# Patient Record
Sex: Female | Born: 1965 | Race: White | Hispanic: No | Marital: Married | Smoking: Current every day smoker
Health system: Southern US, Community
[De-identification: ages and names within clinical notes are randomized; demographics above are authoritative.]

## PROBLEM LIST (undated history)

## (undated) DIAGNOSIS — C50912 Malignant neoplasm of unspecified site of left female breast: Secondary | ICD-10-CM

## (undated) DIAGNOSIS — D75829 Heparin-induced thrombocytopenia, unspecified: Secondary | ICD-10-CM

## (undated) DIAGNOSIS — R112 Nausea with vomiting, unspecified: Secondary | ICD-10-CM

## (undated) DIAGNOSIS — Z9889 Other specified postprocedural states: Secondary | ICD-10-CM

## (undated) DIAGNOSIS — IMO0002 Reserved for concepts with insufficient information to code with codable children: Secondary | ICD-10-CM

## (undated) DIAGNOSIS — D689 Coagulation defect, unspecified: Secondary | ICD-10-CM

## (undated) DIAGNOSIS — G4730 Sleep apnea, unspecified: Secondary | ICD-10-CM

## (undated) DIAGNOSIS — F419 Anxiety disorder, unspecified: Secondary | ICD-10-CM

## (undated) DIAGNOSIS — D7582 Heparin induced thrombocytopenia (HIT): Secondary | ICD-10-CM

## (undated) DIAGNOSIS — E785 Hyperlipidemia, unspecified: Secondary | ICD-10-CM

## (undated) DIAGNOSIS — J449 Chronic obstructive pulmonary disease, unspecified: Secondary | ICD-10-CM

## (undated) DIAGNOSIS — I2510 Atherosclerotic heart disease of native coronary artery without angina pectoris: Secondary | ICD-10-CM

## (undated) DIAGNOSIS — D509 Iron deficiency anemia, unspecified: Secondary | ICD-10-CM

## (undated) DIAGNOSIS — K219 Gastro-esophageal reflux disease without esophagitis: Secondary | ICD-10-CM

## (undated) DIAGNOSIS — M26609 Unspecified temporomandibular joint disorder, unspecified side: Secondary | ICD-10-CM

## (undated) DIAGNOSIS — I10 Essential (primary) hypertension: Secondary | ICD-10-CM

## (undated) DIAGNOSIS — A770 Spotted fever due to Rickettsia rickettsii: Secondary | ICD-10-CM

## (undated) DIAGNOSIS — I2699 Other pulmonary embolism without acute cor pulmonale: Secondary | ICD-10-CM

## (undated) HISTORY — DX: Coagulation defect, unspecified: D68.9

## (undated) HISTORY — DX: Anxiety disorder, unspecified: F41.9

## (undated) HISTORY — DX: Other pulmonary embolism without acute cor pulmonale: I26.99

## (undated) HISTORY — DX: Hyperlipidemia, unspecified: E78.5

## (undated) HISTORY — DX: Malignant neoplasm of unspecified site of left female breast: C50.912

## (undated) HISTORY — DX: Gastro-esophageal reflux disease without esophagitis: K21.9

## (undated) HISTORY — PX: LEFT OOPHORECTOMY: SHX1961

## (undated) HISTORY — DX: Sleep apnea, unspecified: G47.30

## (undated) HISTORY — DX: Heparin-induced thrombocytopenia, unspecified: D75.829

## (undated) HISTORY — DX: Iron deficiency anemia, unspecified: D50.9

## (undated) HISTORY — PX: LUMBAR FUSION: SHX111

## (undated) HISTORY — DX: Heparin induced thrombocytopenia (HIT): D75.82

## (undated) HISTORY — DX: Reserved for concepts with insufficient information to code with codable children: IMO0002

## (undated) HISTORY — DX: Unspecified temporomandibular joint disorder, unspecified side: M26.609

## (undated) HISTORY — PX: CHOLECYSTECTOMY: SHX55

---

## 1999-04-21 ENCOUNTER — Ambulatory Visit (HOSPITAL_BASED_OUTPATIENT_CLINIC_OR_DEPARTMENT_OTHER): Admission: RE | Admit: 1999-04-21 | Discharge: 1999-04-21 | Payer: Self-pay | Admitting: Otolaryngology

## 2001-02-09 ENCOUNTER — Encounter: Admission: RE | Admit: 2001-02-09 | Discharge: 2001-02-09 | Payer: Self-pay | Admitting: Internal Medicine

## 2001-02-09 ENCOUNTER — Encounter: Payer: Self-pay | Admitting: Internal Medicine

## 2001-02-23 ENCOUNTER — Encounter: Payer: Self-pay | Admitting: Neurosurgery

## 2001-02-23 ENCOUNTER — Encounter: Admission: RE | Admit: 2001-02-23 | Discharge: 2001-02-23 | Payer: Self-pay | Admitting: Neurosurgery

## 2001-10-02 ENCOUNTER — Encounter: Payer: Self-pay | Admitting: Neurosurgery

## 2001-10-05 ENCOUNTER — Inpatient Hospital Stay (HOSPITAL_COMMUNITY): Admission: RE | Admit: 2001-10-05 | Discharge: 2001-10-06 | Payer: Self-pay | Admitting: Neurosurgery

## 2001-10-05 ENCOUNTER — Encounter: Payer: Self-pay | Admitting: Neurosurgery

## 2001-11-12 ENCOUNTER — Encounter: Payer: Self-pay | Admitting: Neurosurgery

## 2001-11-12 ENCOUNTER — Ambulatory Visit (HOSPITAL_COMMUNITY): Admission: RE | Admit: 2001-11-12 | Discharge: 2001-11-12 | Payer: Self-pay

## 2002-05-02 ENCOUNTER — Encounter: Payer: Self-pay | Admitting: Neurosurgery

## 2002-05-02 ENCOUNTER — Encounter: Admission: RE | Admit: 2002-05-02 | Discharge: 2002-05-02 | Payer: Self-pay | Admitting: Neurosurgery

## 2002-11-22 ENCOUNTER — Encounter: Payer: Self-pay | Admitting: Otolaryngology

## 2002-11-22 ENCOUNTER — Encounter: Admission: RE | Admit: 2002-11-22 | Discharge: 2002-11-22 | Payer: Self-pay | Admitting: Otolaryngology

## 2003-01-01 ENCOUNTER — Other Ambulatory Visit: Admission: RE | Admit: 2003-01-01 | Discharge: 2003-01-01 | Payer: Self-pay | Admitting: Obstetrics and Gynecology

## 2003-01-08 ENCOUNTER — Ambulatory Visit (HOSPITAL_COMMUNITY): Admission: RE | Admit: 2003-01-08 | Discharge: 2003-01-08 | Payer: Self-pay | Admitting: Obstetrics and Gynecology

## 2003-01-08 ENCOUNTER — Encounter: Payer: Self-pay | Admitting: Obstetrics and Gynecology

## 2003-03-05 ENCOUNTER — Ambulatory Visit (HOSPITAL_COMMUNITY): Admission: RE | Admit: 2003-03-05 | Discharge: 2003-03-05 | Payer: Self-pay | Admitting: Obstetrics and Gynecology

## 2004-01-16 ENCOUNTER — Encounter: Admission: RE | Admit: 2004-01-16 | Discharge: 2004-01-16 | Payer: Self-pay | Admitting: Obstetrics and Gynecology

## 2004-01-28 ENCOUNTER — Other Ambulatory Visit: Admission: RE | Admit: 2004-01-28 | Discharge: 2004-01-28 | Payer: Self-pay | Admitting: Obstetrics and Gynecology

## 2006-08-08 ENCOUNTER — Encounter: Admission: RE | Admit: 2006-08-08 | Discharge: 2006-08-08 | Payer: Self-pay | Admitting: Family Medicine

## 2007-05-16 ENCOUNTER — Ambulatory Visit (HOSPITAL_COMMUNITY): Admission: RE | Admit: 2007-05-16 | Discharge: 2007-05-16 | Payer: Self-pay | Admitting: Family Medicine

## 2007-06-25 ENCOUNTER — Inpatient Hospital Stay (HOSPITAL_COMMUNITY): Admission: AD | Admit: 2007-06-25 | Discharge: 2007-06-25 | Payer: Self-pay | Admitting: Obstetrics and Gynecology

## 2007-09-27 ENCOUNTER — Encounter: Admission: RE | Admit: 2007-09-27 | Discharge: 2007-09-27 | Payer: Self-pay | Admitting: Obstetrics and Gynecology

## 2007-12-04 ENCOUNTER — Encounter: Admission: RE | Admit: 2007-12-04 | Discharge: 2007-12-04 | Payer: Self-pay | Admitting: Obstetrics and Gynecology

## 2008-01-22 ENCOUNTER — Inpatient Hospital Stay (HOSPITAL_COMMUNITY): Admission: AD | Admit: 2008-01-22 | Discharge: 2008-01-25 | Payer: Self-pay | Admitting: Obstetrics and Gynecology

## 2009-04-28 ENCOUNTER — Encounter: Admission: RE | Admit: 2009-04-28 | Discharge: 2009-04-28 | Payer: Self-pay | Admitting: Family Medicine

## 2010-09-01 ENCOUNTER — Encounter: Admission: RE | Admit: 2010-09-01 | Discharge: 2010-09-01 | Payer: Self-pay | Admitting: Family Medicine

## 2010-09-11 ENCOUNTER — Encounter: Admission: RE | Admit: 2010-09-11 | Discharge: 2010-09-11 | Payer: Self-pay | Admitting: Family Medicine

## 2010-09-14 ENCOUNTER — Encounter: Admission: RE | Admit: 2010-09-14 | Discharge: 2010-09-14 | Payer: Self-pay | Admitting: Family Medicine

## 2010-10-29 ENCOUNTER — Encounter
Admission: RE | Admit: 2010-10-29 | Discharge: 2010-10-29 | Payer: Self-pay | Source: Home / Self Care | Attending: Family Medicine | Admitting: Family Medicine

## 2010-11-06 ENCOUNTER — Encounter: Payer: Self-pay | Admitting: Family Medicine

## 2010-11-07 ENCOUNTER — Encounter: Payer: Self-pay | Admitting: Family Medicine

## 2010-11-07 ENCOUNTER — Encounter: Payer: Self-pay | Admitting: Physical Medicine and Rehabilitation

## 2010-11-17 ENCOUNTER — Encounter: Payer: Self-pay | Admitting: Family Medicine

## 2011-02-28 ENCOUNTER — Other Ambulatory Visit: Payer: Self-pay | Admitting: General Surgery

## 2011-03-01 NOTE — H&P (Signed)
Brenda Cruz, Brenda Cruz               ACCOUNT NO.:  [id number]   MEDICAL RECORD NO.:  [id number]          PATIENT TYPE:  INP   LOCATION:  9168                          FACILITY:  [facility]   PHYSICIAN:  Lenoard Aden, M.D.DATE OF BIRTH:  07-Sep-1966   DATE OF ADMISSION:  01/22/2008  DATE OF DISCHARGE:                              HISTORY & PHYSICAL   INDICATIONS FOR INDUCTION:  Labile poorly controlled gestational  diabetes and polyhydramnios for induction.  She is a 45 year old white  female G1, P0, history of IVF induced conception at [redacted] weeks gestation  who presents for cervical ripening and induction as per noted.   ALLERGIES:  She has allergies to,  1. SULFA.  2. CIPRO.  3. IVP DYE.  4. PENICILLIN.  5. ZITHROMAX.   SOCIAL HISTORY:  She is a nonsmoker and nondrinker.  Denies domestic  physical violence.   MEDICATIONS:  Include,  1. Darvocet as needed for back pain.  2. Omnicef for an upper respiratory infection currently improved.  3. Prenatal vitamins.   FAMILY HISTORY:  She has a family history of breast cancer, heart  disease, myocardial infarction, hypertension, COPD, stroke, seizure  disorder, lupus, esophageal cancer, stomach cancer, and bipolar  disorder.   PERSONAL HISTORY:  She has a personal history of tonsillectomy, left  S&O, spinal fusion, and ovarian torsion.   Her prenatal course is complicated by gestational diabetes with recently  increased labile blood sugars, worsening back pain due to previous  spinal surgery, and polyhydramnios.   PHYSICAL EXAMINATION:  GENERAL:  She is a well-developed, well-  nourished, white female in no acute distress.  HEENT:  Normal.  LUNGS:  Clear.  HEART:  Regular rate rhythm.  ABDOMEN:  Soft and nontender.  Estimated fetal weight is by ultrasound 1  week ago 6 pounds and 2 ounces with borderline polyhydramnios.  Cervix  closed, 80% vertex -1.  EXTREMITIES:  There are no cords.  NEUROLOGICAL:  Nonfocal.  SKIN:   Intact.   IMPRESSION:  1. A 38-week intrauterine pregnancy.  2. Poorly compliant gestational diabetes.  3. Worsening low back pain.  4. Polyhydramnios.   PLAN:  Plan is to proceed with cervical opening induction, Cervidil,  Ambien, and Pitocin epidural as needed.      Lenoard Aden, M.D.  Electronically Signed     Lenoard Aden, M.D.  Electronically Signed    RJT/MEDQ  D:  01/22/2008  T:  01/23/2008  Job:  [id number]

## 2011-03-04 NOTE — Op Note (Signed)
Brenda Cruz, Brenda Cruz                         ACCOUNT NO.:  [id number]   MEDICAL RECORD NO.:  [id number]                   PATIENT TYPE:  AMB   LOCATION:  SDC                                  FACILITY:  [facility]   PHYSICIAN:  Lenoard Aden, M.D.             DATE OF BIRTH:  04-May-1966   DATE OF PROCEDURE:  03/05/2003  DATE OF DISCHARGE:                                 OPERATIVE REPORT   PREOPERATIVE DIAGNOSES:  1. Left lower quadrant pain.  2. Left tubal obstruction.   POSTOPERATIVE DIAGNOSES:  1. Left tubal obstruction.  2. Pelvic adhesions.  3. Left ovarian cyst.  4. Endometriosis.   SURGEON:  Lenoard Aden, M.D.   ASSISTANT:  Cordelia Pen A. Rosalio Macadamia, M.D.   ANESTHESIA:  General.   ESTIMATED BLOOD LOSS:  Less than 50 mL.   COMPLICATIONS:  None.   DRAINS:  None.   DISPOSITION:  Patient to recovery in good condition.   DESCRIPTION OF PROCEDURE:  After being apprised of the risks of anesthesia,  infection, bleeding, injury to abdominal organs with need for repair, the  patient was brought to the operating room, where she was administered a  general anesthetic without complications, prepped and draped in the usual  sterile fashion.  Acorn cannula and single-tooth tenaculum placed per vagina  after achieving adequate general anesthesia.  Infraumbilical incision made  with a scalpel, a Veress needle placed, opening pressure -2 noted.  Carbon  dioxide 2.5 L insufflated without difficulty, good pneumoperitoneum  established, a trocar placed, atraumatic trocar entry visualized and  established.  Trendelenburg position established.  Visualization reveals  adhesions of the left sigmoid colon perimesenteric fat to the right adnexal  scar, this after two 5 mm trocar sites are made in the midline and left  lower quadrant with transillumination and atraumatic placement.  The  adhesions are lysed using monopolar scissors, good hemostasis noted.  Atraumatic lysis of adhesions, no  involvement of the bowel is noted.  The  left adnexa is then noted to be tied up in an adhesive process with the left  sigmoid colon.  At this time with retraction of the sigmoid colon, there  appeared to be multiple peritubal adhesions, which were lysed sharply using  the monopolar scissors after identifying the ureter and the  infundibulopelvic vessels on the left side.  No evidence of injury to the  bowel is noted.  Good hemostasis is noted.  Upon visualization of the left  tube it appears that there is an area of stenosis in the ampullary isthmic  portion of the tube from previous tubal torsion, which has created a distal  possibility of a possible hydrosalpinx and blunted fimbriae on the left end.  The chromopertubation is performed with filling of the dye to the area of  the obstruction but no evidence of extravasation into the distal portion of  the tube and the appearance that these portions of  tube are actually  separated from a long-term scarring process.  Two ovarian cysts which were  noted to be near this area are punctured and drained of clear fluid using  the monopolar scissors and two ovarian cystotomies are performed, with good  hemostasis noted, and the left uterosacral ligament reveals evidence of  endometriosis, which is cauterized using monopolar scissors.  No evidence of  endometriosis is noted.  The CO2 is released and good hemostasis is  achieved.  Irrigation is accomplished.  At this time all instruments are  removed under direct visualization and CO2 is released.  The incision is  closed using Dermabond and 0 Vicryl.  The patient tolerates the procedure  well.  Instruments are removed from the vagina.  She is transferred to  recovery in good condition.                                               Lenoard Aden, M.D.    RJT/MEDQ  D:  03/05/2003  T:  03/05/2003  Job:  [id number]

## 2011-03-04 NOTE — Op Note (Signed)
[place]  Patient:    Brenda Cruz, Brenda Cruz Visit Number: [id number] MRN: [id number]          Service Type: SUR Location: [id number] Attending Physician:  Donn Pierini Dictated by:   Julio Sicks, M.D. Proc. 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  PROCEDURES:  L5-S1 decompressive lumbar laminectomy with bilateral L5-S1 microdiskectomies.  L5-S1 posterior lumbar interbody fusion utilizing Tangent wedges and local autograft.  L5-S1 posterolateral fusion with pedicle screw instrumentation and local autograft.  SURGEON:  Julio Sicks, M.D.  ASSISTANT:  Reinaldo Meeker, M.D.  ANESTHESIA:  General endotracheal.  INDICATIONS:  Brenda Cruz is a 45 year old female with a history of chronic back and bilateral lower extremity pain, right greater than left, failing all conservative management, including physical therapy, epidural steroid injections, rest, and activity modifications.  MRI scan demonstrated a broad-based disk herniation at L5-S1 paracentrally off to the right, but also with extension out to the right side at L5 foramen.  This does cause some deformation of thecal sac and some compression of the exiting right-sided L5 nerve root.  The patient has failed all forms of conservative management.  We discussed options available, continued management, including possibility of undergoing decompression and fusion surgery at L5-S1.  The patient is aware of the risks and benefits and wishes to proceed.  DESCRIPTION OF PROCEDURE:  The patient was taken to the operating room,  placed on operating table in supine position.  After adequate level of anesthesia achieved, the patient was positioned prone onto a Wilson frame, appropriately padded for pressure.  The patients lumbar region was prepped and draped sterilely.  A #10-blade was used to make a linear skin incision overlying the L4-5 and S1 levels.  This was carried down sharply in the midline.  A subperiosteal dissection was then performed exposing the lamina and facet joints of L5 and S1, as well as the lamina of L4.  Dissection proceeded out further lateral exposing the transverse processes of L5 and the sacral ala. Deep self-retaining retractor was placed.  Intraoperative fluoroscopy was used and the level was confirmed.  A decompressive laminectomy was then performed at L5 and S1 utilizing Leksell rongeurs, Kerrison rongeurs, and the high-speed drill.  All bone was saved and used for later use in autografting.  The entire lamina at L5 was removed bilaterally, as were the inferior facets of L5 bilaterally.  The superior facets of S1 were partially resected as well.  The ligamentum flavum was then elevated and resected in a piecemeal fashion using Kerrison rongeurs.  The underlying thecal sac and exiting L5 and S1 nerve roots were identified and wide foraminotomies were performed along their course.  The epidural venous plexus was coagulated and cut.  The thecal sac was then mobilized starting first on the right side.  The disk herniation was readily apparent.  It was incised with a 15-blade in a rectangular fashion.  A wide disk space clean out was then achieved using pituitary rongeurs, forward-angled pituitary rongeurs and Epstein curets.  All loose or  obviously degenerative disk material was removed from the interspace.  All elements of the foraminal disk herniation were also removed.  Attention was then placed to the contralateral side, again with the nerve roots and thecal sac protected. An aggressive  diskectomy was performed on the patient of the the side.  At this point, the disk space was then sequentially distracted up to 8 mm. Starting first on the patients left side with the distractor on the right side, the thecal sac and nerve roots were protected.  The disk space was then reamed and then cut with an 8 mm Tangent chisel.  All loose material was removed from the interspace.  An 8 x 26 mm Tangent wedge was then impacted into place and then recessed approximately 2 mm and ______ cortical surface. Distractor and retractors were removed.  The procedure was then repeated on the contralateral side.  Prior to instillation of the second wedge, the endplates were further denuded with Epstein curets and local autograft was packed into the interspace.  A second 8 x 26 mm Tangent wedge was then impacted into the interspace, and again recessed approximately 2 mm from the posterior cortical margin.  Intraoperative x-rays revealed good position of the bone grafts with normal alignment of the spine.  Attention was then placed placing pedicle screw instrumentation.  The pedicles of L5 and S1 were isolated by surface landmarks and fluoroscopy bilaterally at L5 and S1. Superficial bone was removed overlying the pedicles utilizing the high-speed drill.  Each pedicle was then tapped with a pedicle awl under fluoroscopic guidance.  Each pedicle awl tract was found to be solidly within bone by using a blunt probe.  Each pedicle awl tract was then tapped further with a 5.25 mm screw tap.  Each screw tap hole was found to be solidly within bone.  At L5, a 6.75 x 40 mm SDRS variable angle pedicle screws were placed bilaterally. At S1, a 6.75 x 35 mm SDRS variable angle pedicle screws were placed bilaterally.  Fluoroscopy revealed good position of the bone grafts and pedicle screws bilaterally.  The transverse processes and sacral ala were then decorticated using the high-speed drill.  Morcellized autograft  was packed posterolaterally.  A short segment titanium rod was then contoured and placed over the screw heads at L5 and S1.  Locking caps were placed over the screw  heads.  These were then engaged in a sequential fashion to place the construct under compression.  Final images in both the lateral and A/P planes revealed good position of the bone grafts and hardware with proper alignment of the spine.  A blunt probe was passed easily along the course of the exiting nerve roots and thecal sac.  There was no evidence of any compression.  There was no evidence of injury to the thecal sac or nerve roots.  The wound was then irrigated one final time.  Gelfoam was placed topically for hemostasis, which was found to be good.  A medium Hemovac drain was left in the epidural space. The wound was then closed in typical fashion.  Steri-Strips and sterile dressing were applied.  There were no apparent complications.  The patient tolerated the procedure well and she returns to recovery room postoperatively. Dictated by:   Julio Sicks, M.D. Attending Physician:  Donn Pierini DD:  10/05/01 TD:  10/06/01 Job: [id number]

## 2011-03-04 NOTE — H&P (Signed)
   NAME:  Brenda Cruz, Brenda Cruz                         ACCOUNT NO.:  [id number]   MEDICAL RECORD NO.:  [id number]                   PATIENT TYPE:  AMB   LOCATION:  SDC                                  FACILITY:  [facility]   PHYSICIAN:  Lenoard Aden, M.D.             DATE OF BIRTH:  October 26, 1965   DATE OF ADMISSION:  03/05/2003  DATE OF DISCHARGE:                                HISTORY & PHYSICAL   HISTORY OF PRESENT ILLNESS:  The patient is a 45 year old white female, G-0,  P-0 with a history of surgery, to include torsion of her tubes previously in  1992, and in 1996, status post right salpingo-oophorectomy in 1996, who  presents with a left tubal obstruction on HSG, for definitive therapy in the  form of evaluation.   PAST MEDICAL HISTORY:  1. Remarkable for a spinal fusion.  2. Tonsillectomy.  3. Hospitalization for dehydration.  4. Remarkable for reflux.   ALLERGIES:  IVP DYE, UNASYN, SULFA DRUGS AND CIPRO.   MEDICATIONS:  1. Nexium.  2. Prozac.  3. Multivitamin.   SOCIAL HISTORY:  She is a one-pack-per-day-smoker for 15 years.   FAMILY HISTORY:  She has a family history of hypertension and breast cancer.   PHYSICAL EXAMINATION:  GENERAL:  The patient is an obese white female, in no  acute distress.  HEENT:  Normal.  LUNGS:  Clear.  HEART:  A regular rate and rhythm.  ABDOMEN:  Soft and nontender.  PELVIC:  An anteflexed uterus and no adnexal tenderness or masses.  EXTREMITIES:  Show no cords.  NEUROLOGIC:  Nonfocal.   IMPRESSION:  1. History of bilateral tubal torsion with pelvic pain.  2. History of tubal obstruction on hysterosalpingogram.    PLAN:  To proceed with a diagnostic hysteroscopy and possible lysis of  adhesions.  The risks of anesthesia, infection, bleeding, inability to  relieve tubal obstruction are discussed.  The risks of delayed, versus  immediate complications, to include bowel and bladder injury are noted.  The  patient acknowledges and wishes to  proceed.                                                  Lenoard Aden, M.D.    RJT/MEDQ  D:  03/05/2003  T:  03/05/2003  Job:  [id number]

## 2011-03-05 ENCOUNTER — Ambulatory Visit
Admission: RE | Admit: 2011-03-05 | Discharge: 2011-03-05 | Disposition: A | Discharge status: Home / Self Care | Payer: PRIVATE HEALTH INSURANCE | Source: Ambulatory Visit | Attending: General Surgery | Admitting: General Surgery

## 2011-03-05 MED ORDER — GADOBENATE DIMEGLUMINE 529 MG/ML IV SOLN
20.0000 mL | Freq: Once | INTRAVENOUS | Status: AC | PRN
  Administered 2011-03-05: 20 mL via INTRAVENOUS

## 2011-06-27 ENCOUNTER — Encounter: Payer: Self-pay | Admitting: Internal Medicine

## 2011-07-12 ENCOUNTER — Encounter: Payer: Self-pay | Admitting: Internal Medicine

## 2011-07-12 ENCOUNTER — Other Ambulatory Visit: Payer: PRIVATE HEALTH INSURANCE

## 2011-07-12 ENCOUNTER — Ambulatory Visit (INDEPENDENT_AMBULATORY_CARE_PROVIDER_SITE_OTHER): Payer: PRIVATE HEALTH INSURANCE | Admitting: Internal Medicine

## 2011-07-12 DIAGNOSIS — R1084 Generalized abdominal pain: Secondary | ICD-10-CM

## 2011-07-12 DIAGNOSIS — R142 Eructation: Secondary | ICD-10-CM

## 2011-07-12 DIAGNOSIS — R194 Change in bowel habit: Secondary | ICD-10-CM

## 2011-07-12 DIAGNOSIS — R197 Diarrhea, unspecified: Secondary | ICD-10-CM

## 2011-07-12 DIAGNOSIS — R143 Flatulence: Secondary | ICD-10-CM

## 2011-07-12 DIAGNOSIS — K219 Gastro-esophageal reflux disease without esophagitis: Secondary | ICD-10-CM

## 2011-07-12 DIAGNOSIS — R198 Other specified symptoms and signs involving the digestive system and abdomen: Secondary | ICD-10-CM

## 2011-07-12 LAB — CBC
HCT: 33.8 — ABNORMAL LOW
Hemoglobin: 11.8 — ABNORMAL LOW
RBC: 3.47 — ABNORMAL LOW
RBC: 3.69 — ABNORMAL LOW
WBC: 10.7 — ABNORMAL HIGH
WBC: 14.8 — ABNORMAL HIGH

## 2011-07-12 MED ORDER — PEG-KCL-NACL-NASULF-NA ASC-C 100 G PO SOLR: 1.0000 | Freq: Once | ORAL | Status: DC

## 2011-07-12 NOTE — Progress Notes (Signed)
HISTORY OF PRESENT ILLNESS:  Brenda Cruz is a 45 y.o. female with degenerative disc disease, obesity, anxiety, asthma, and chronic GERD. She is referred today regarding chronic abdominal complaints. The patient reports a 1-2 year history of problems with postprandial fullness or bloating. As well intermittent problems with severe abdominal cramping discomfort. Historically, she moved her bowels once every 2 days. Over the past 6 weeks, she reports diarrhea, with bowel movements 3-4 times per day. She does tell that she underwent cholecystectomy about one year ago for these symptoms. No improvement post cholecystectomy. Despite problems with diarrhea, she reports 10 pound weight gain over the past month. She smokes. She has some nausea but no vomiting. For GERD, she takes Nexium. Good control of symptoms. She reports multiple relatives with GI cancers. Father with colon polyps. No colon cancer. Review of outside records signs blood work from May of 2012. Normal thyroid study, normal CBC with differential, normal comprehensive metabolic panel except for mildly elevated glucose. Normal amylase and vitamin D level. Because of her abdominal pain, the patient also underwent an MRI of the abdomen with and without contrast on 03/07/2011. This was unremarkable postcholecystectomy. Chest imaging studies have shown some mild central lobular emphysema. She has had antibiotics within the past few months for sinus infection  REVIEW OF SYSTEMS:  All non-GI ROS negative except for sinus and allergy trouble, anxiety, back pain, cough, fatigue, menstrual pain, muscle cramps, night sweats, swollen lymph glands  Past Medical History  Diagnosis Date  . Asthma   . GERD (gastroesophageal reflux disease)   . DDD (degenerative disc disease)   . Hyperlipemia   . Anxiety   . Hemorrhoids     Past Surgical History  Procedure Date  . Cholecystectomy   . Lumbar fusion     Social History [name]  reports that  she has been smoking.  She has never used smokeless tobacco. She reports that she does not drink alcohol or use illicit drugs.  family history includes Breast cancer in her mother; Colon polyps in her father; Esophageal cancer in her maternal grandfather; Ovarian cancer in an unspecified family member; and Stomach cancer in her paternal grandmother.  There is no history of Colon cancer.  Allergies  Allergen Reactions  . Iohexol      Code: RASH, Desc: White blisters in mouth during ivp in [place] '93, ok w/ 13 hour prep today//a.calhoun, Onset Date: [id number]   . Penicillins   . Sulfa Antibiotics   . Zithromax (Azithromycin)        PHYSICAL EXAMINATION: Vital signs: BP 122/60  Pulse 80  Ht 5\' 7"  (1.702 m)  Wt 224 lb (101.606 kg)  BMI 35.08 kg/m2  Constitutional: Obese, generally well-appearing, no acute distress Psychiatric: alert and oriented x3, cooperative Eyes: extraocular movements intact, anicteric, conjunctiva pink Mouth: oral pharynx moist, no lesions Neck: supple no lymphadenopathy Cardiovascular: heart regular rate and rhythm, no murmur Lungs: clear to auscultation bilaterally Abdomen: soft, obese, nontender, nondistended, no obvious ascites, no peritoneal signs, normal bowel sounds, no organomegaly Rectal: Deferred until colonoscopy Extremities: no lower extremity edema bilaterally Skin: no lesions on visible extremities Neuro: No focal deficits.   ASSESSMENT:  #1. Chronic abdominal complaints suggestive of irritable bowel syndrome. More recent issues with diarrhea. Rule out organic processes such as celiac disease, infection, or microscopic colitis #2. GERD. Classic symptoms controlled with Nexium #3. Status post cholecystectomy #4. Obesity, anxiety, chronic tobacco abuse   PLAN:  #1. Tissue transglutaminase antibody #2. Stool  for O&P, C. difficile by PCR #3. Colonoscopy with biopsies and upper endoscopy with biopsies.The nature of the procedure, as well  as the risks, benefits, and alternatives were carefully and thoroughly reviewed with the patient. Ample time for discussion and questions allowed. The patient understood, was satisfied, and agreed to proceed. Movi prep prescribed. The patient instructed on its use #4. Propofol sedation with CRNA supervision. This because of morbid obesity, asthma, and anxiety issues #5. Stop smoking

## 2011-07-12 NOTE — Patient Instructions (Addendum)
You have been given a separate informational sheet regarding your tobacco use, the importance of quitting and local resources to help you quit. Colon/Endo [facility] with Propoful 08/09/11 10:00 am arrive at 9:00 am Moviprep sent to your pharmacy Colon/endo brochures given for you to read Labs ordered for you to go to basement floor today and have drawn.

## 2011-07-13 LAB — TISSUE TRANSGLUTAMINASE, IGA: Tissue Transglutaminase Ab, IgA: 2.7 U/mL (ref <20)

## 2011-07-14 ENCOUNTER — Encounter: Payer: Self-pay | Admitting: Internal Medicine

## 2011-07-29 LAB — URINALYSIS, ROUTINE W REFLEX MICROSCOPIC
Bilirubin Urine: NEGATIVE
Glucose, UA: NEGATIVE
Ketones, ur: NEGATIVE
Protein, ur: NEGATIVE
Specific Gravity, Urine: <1.005 — ABNORMAL LOW

## 2011-07-29 LAB — CBC
HCT: 37.2
Platelets: 287
RBC: 4.06
WBC: 13.5 — ABNORMAL HIGH

## 2011-07-29 LAB — POCT PREGNANCY, URINE: Operator id: 27524

## 2011-07-29 LAB — URINE MICROSCOPIC-ADD ON: WBC, UA: NONE SEEN

## 2011-08-09 ENCOUNTER — Encounter: Payer: PRIVATE HEALTH INSURANCE | Admitting: Internal Medicine

## 2011-09-19 ENCOUNTER — Encounter: Payer: PRIVATE HEALTH INSURANCE | Admitting: Internal Medicine

## 2011-09-19 ENCOUNTER — Telehealth: Payer: Self-pay | Admitting: Internal Medicine

## 2011-09-19 NOTE — Telephone Encounter (Signed)
Pt has rescheduled for 10/04/11 for egd with propofol.

## 2011-09-19 NOTE — Telephone Encounter (Signed)
NO CHARGE. SHE MUST RESCHEDULE WITH PROPOFOL

## 2011-10-04 ENCOUNTER — Encounter: Payer: PRIVATE HEALTH INSURANCE | Admitting: Internal Medicine

## 2011-10-12 ENCOUNTER — Other Ambulatory Visit: Payer: Self-pay | Admitting: Family Medicine

## 2011-10-12 DIAGNOSIS — R918 Other nonspecific abnormal finding of lung field: Secondary | ICD-10-CM

## 2011-10-13 ENCOUNTER — Ambulatory Visit
Admission: RE | Admit: 2011-10-13 | Discharge: 2011-10-13 | Disposition: A | Discharge status: Home / Self Care | Payer: PRIVATE HEALTH INSURANCE | Source: Ambulatory Visit | Attending: Family Medicine | Admitting: Family Medicine

## 2011-10-13 DIAGNOSIS — R918 Other nonspecific abnormal finding of lung field: Secondary | ICD-10-CM

## 2011-10-13 MED ORDER — IOHEXOL 300 MG/ML  SOLN
75.0000 mL | Freq: Once | INTRAMUSCULAR | Status: AC | PRN
  Administered 2011-10-13: 75 mL via INTRAVENOUS

## 2011-10-20 ENCOUNTER — Encounter: Payer: PRIVATE HEALTH INSURANCE | Admitting: Internal Medicine

## 2011-11-22 ENCOUNTER — Other Ambulatory Visit: Payer: Self-pay | Admitting: Family Medicine

## 2011-11-22 DIAGNOSIS — R51 Headache: Secondary | ICD-10-CM

## 2011-11-23 ENCOUNTER — Ambulatory Visit
Admission: RE | Admit: 2011-11-23 | Discharge: 2011-11-23 | Disposition: A | Discharge status: Home / Self Care | Payer: PRIVATE HEALTH INSURANCE | Source: Ambulatory Visit | Attending: Family Medicine | Admitting: Family Medicine

## 2011-11-23 DIAGNOSIS — R51 Headache: Secondary | ICD-10-CM

## 2013-01-30 ENCOUNTER — Other Ambulatory Visit: Payer: Self-pay | Admitting: *Deleted

## 2013-02-08 ENCOUNTER — Other Ambulatory Visit: Payer: Self-pay

## 2013-02-08 MED ORDER — FLUOXETINE HCL 20 MG PO TABS: 20.0000 mg | ORAL_TABLET | Freq: Every day | ORAL | Status: DC

## 2013-04-15 ENCOUNTER — Other Ambulatory Visit: Payer: Self-pay

## 2013-04-15 MED ORDER — FLUOXETINE HCL 20 MG PO TABS: 20.0000 mg | ORAL_TABLET | Freq: Every day | ORAL | Status: DC

## 2013-04-15 NOTE — Telephone Encounter (Signed)
Last seen 07/12/12  ACm

## 2013-05-14 ENCOUNTER — Telehealth: Payer: Self-pay | Admitting: Nurse Practitioner

## 2013-05-14 NOTE — Telephone Encounter (Signed)
appt made

## 2013-05-16 ENCOUNTER — Ambulatory Visit (INDEPENDENT_AMBULATORY_CARE_PROVIDER_SITE_OTHER): Payer: 59 | Admitting: Nurse Practitioner

## 2013-05-16 ENCOUNTER — Encounter: Payer: Self-pay | Admitting: Nurse Practitioner

## 2013-05-16 VITALS — BP 123/79 | HR 75 | Temp 98.4°F | Ht 67.0 in | Wt 219.0 lb

## 2013-05-16 DIAGNOSIS — F411 Generalized anxiety disorder: Secondary | ICD-10-CM

## 2013-05-16 DIAGNOSIS — R6889 Other general symptoms and signs: Secondary | ICD-10-CM

## 2013-05-16 DIAGNOSIS — K219 Gastro-esophageal reflux disease without esophagitis: Secondary | ICD-10-CM | POA: Insufficient documentation

## 2013-05-16 DIAGNOSIS — I998 Other disorder of circulatory system: Secondary | ICD-10-CM

## 2013-05-16 DIAGNOSIS — J309 Allergic rhinitis, unspecified: Secondary | ICD-10-CM

## 2013-05-16 DIAGNOSIS — F329 Major depressive disorder, single episode, unspecified: Secondary | ICD-10-CM

## 2013-05-16 MED ORDER — LORAZEPAM 1 MG PO TABS: 1.0000 mg | ORAL_TABLET | Freq: Two times a day (BID) | ORAL | Status: DC | PRN

## 2013-05-16 NOTE — Progress Notes (Signed)
  Subjective:    Patient ID: Brenda Cruz, female    DOB: [birth year], 47 y.o.   MRN: [id number]  HPI  Patient in to discuss blood pressure- Monday she went to urgent care with Blood pressure of over 200 systolic- they sent her home- [facility] pressure again at greater than 200 systolic- they gave her HCTZ to take which patient did not take- She stayed out of work yesterday and blood pressure stayed down all day but heart rate was in the 90's- Blood pressure this AM was normal- The only thing that has changed is that she has been out of ativan for over a month. Patient says she doesn't feel overly anxious.    Review of Systems  Constitutional: Negative for fever and fatigue.  HENT: Positive for congestion.   Respiratory: Positive for cough.   Cardiovascular: Negative for chest pain, palpitations and leg swelling.  Endocrine: Negative.   Genitourinary: Negative.   Musculoskeletal: Negative.   Allergic/Immunologic: Negative.   Neurological: Negative.   Hematological: Negative.   Psychiatric/Behavioral: Negative.        Objective:   Physical Exam  Constitutional: She appears well-developed and well-nourished.  HENT:  Head: Normocephalic.  Right Ear: External ear normal.  Left Ear: External ear normal.  Nose: Mucosal edema and rhinorrhea present. Right sinus exhibits no maxillary sinus tenderness and no frontal sinus tenderness. Left sinus exhibits no maxillary sinus tenderness and no frontal sinus tenderness.  Mouth/Throat: Oropharynx is clear and moist.  Cardiovascular: Normal rate, regular rhythm and normal heart sounds.   Pulmonary/Chest: Effort normal and breath sounds normal.  Abdominal: Soft. Bowel sounds are normal.  Skin: Skin is warm.  Psychiatric: She has a normal mood and affect. Her behavior is normal. Judgment and thought content normal.    BP 123/79  Pulse 75  Temp(Src) 98.4 F (36.9 C) (Oral)  Ht 5\' 7"  (1.702 m)  Wt 219 lb  (99.338 kg)  BMI 34.29 kg/m2       Assessment & Plan:  1. Fluctuating blood pressure Keep diary of blood pressure Low NA+ diet Avoid decongestants  2. GAD (generalized anxiety disorder) Stress management - LORazepam (ATIVAN) 1 MG tablet; Take 1 tablet (1 mg total) by mouth 2 (two) times daily as needed for anxiety.  Dispense: 60 tablet; Refill: 0  3. Depression Continue current meds  5. Allergic rhinitis Get back on flonase daily as rx  Reviewed all hospital records  [name] San Juan, [name]

## 2013-07-01 ENCOUNTER — Other Ambulatory Visit: Payer: Self-pay

## 2013-07-01 MED ORDER — FLUOXETINE HCL 20 MG PO TABS: 20.0000 mg | ORAL_TABLET | Freq: Every day | ORAL | Status: DC

## 2013-07-03 ENCOUNTER — Encounter: Payer: Self-pay | Admitting: Family Medicine

## 2013-07-03 ENCOUNTER — Ambulatory Visit (INDEPENDENT_AMBULATORY_CARE_PROVIDER_SITE_OTHER): Payer: 59 | Admitting: Family Medicine

## 2013-07-03 ENCOUNTER — Telehealth: Payer: Self-pay | Admitting: Nurse Practitioner

## 2013-07-03 VITALS — BP 108/71 | HR 80 | Temp 98.6°F | Ht 67.0 in | Wt 223.4 lb

## 2013-07-03 DIAGNOSIS — J329 Chronic sinusitis, unspecified: Secondary | ICD-10-CM

## 2013-07-03 MED ORDER — CEPHALEXIN 500 MG PO CAPS: 500.0000 mg | ORAL_CAPSULE | Freq: Four times a day (QID) | ORAL | Status: DC

## 2013-07-03 MED ORDER — FLUCONAZOLE 150 MG PO TABS: ORAL_TABLET | ORAL | Status: DC

## 2013-07-03 MED ORDER — METHYLPREDNISOLONE (PAK) 4 MG PO TABS: ORAL_TABLET | ORAL | Status: DC

## 2013-07-03 NOTE — Progress Notes (Signed)
  Subjective:    Patient ID: Brenda Cruz, female    DOB: 10/31/1965, 47 y.o.   MRN: [id number]  HPI This 47 y.o. female presents for evaluation of URI SX for over a week.   Review of Systems No chest pain, SOB, HA, dizziness, vision change, N/V, diarrhea, constipation, dysuria, urinary urgency or frequency, myalgias, arthralgias or rash.     Objective:   Physical Exam Vital signs noted  Well developed well nourished [place].  HEENT - Head atraumatic Normocephalic                Eyes - PERRLA, Conjuctiva - clear Sclera- Clear EOMI                Ears - EAC's Wnl TM's Wnl Gross Hearing WNL                Nose - Nares patent                 Throat - oropharanx wnl Respiratory - Lungs CTA bilateral. Cardiac - RRR S1 and S2 without murmur GI - Abdomen soft Nontender and bowel sounds active x 4 Extremities - No edema. Neuro - Grossly intact.       Assessment & Plan:  Sinusitis - Plan: cephALEXin (KEFLEX) 500 MG capsule, fluconazole (DIFLUCAN) 150 MG tablet, methylPREDNIsolone (MEDROL DOSPACK) 4 MG tablet

## 2013-07-03 NOTE — Telephone Encounter (Signed)
appts scheduled for pt and husband

## 2013-07-03 NOTE — Patient Instructions (Signed)

## 2013-07-12 ENCOUNTER — Ambulatory Visit: Payer: 59 | Admitting: Nurse Practitioner

## 2013-07-15 ENCOUNTER — Ambulatory Visit (INDEPENDENT_AMBULATORY_CARE_PROVIDER_SITE_OTHER): Payer: 59 | Admitting: General Practice

## 2013-07-15 ENCOUNTER — Telehealth: Payer: Self-pay | Admitting: Family Medicine

## 2013-07-15 ENCOUNTER — Ambulatory Visit (INDEPENDENT_AMBULATORY_CARE_PROVIDER_SITE_OTHER): Payer: 59

## 2013-07-15 ENCOUNTER — Encounter: Payer: Self-pay | Admitting: General Practice

## 2013-07-15 VITALS — BP 158/106 | HR 79 | Temp 98.4°F | Ht 67.0 in | Wt 226.0 lb

## 2013-07-15 DIAGNOSIS — J209 Acute bronchitis, unspecified: Secondary | ICD-10-CM

## 2013-07-15 DIAGNOSIS — J322 Chronic ethmoidal sinusitis: Secondary | ICD-10-CM

## 2013-07-15 DIAGNOSIS — R05 Cough: Secondary | ICD-10-CM

## 2013-07-15 MED ORDER — PREDNISONE 50 MG PO TABS: ORAL_TABLET | ORAL | Status: DC

## 2013-07-15 MED ORDER — BENZONATATE 100 MG PO CAPS: 100.0000 mg | ORAL_CAPSULE | Freq: Two times a day (BID) | ORAL | Status: DC | PRN

## 2013-07-15 MED ORDER — DOXYCYCLINE HYCLATE 100 MG PO TABS: 100.0000 mg | ORAL_TABLET | Freq: Two times a day (BID) | ORAL | Status: DC

## 2013-07-15 MED ORDER — HYDROCODONE-HOMATROPINE 5-1.5 MG/5ML PO SYRP: 5.0000 mL | ORAL_SOLUTION | Freq: Three times a day (TID) | ORAL | Status: AC | PRN

## 2013-07-15 NOTE — Telephone Encounter (Signed)
APT MADE 

## 2013-07-15 NOTE — Progress Notes (Signed)
  Subjective:    Patient ID: Brenda Cruz, female    DOB: 07/07/1966, 47 y.o.   MRN: [id number]  HPI Patient presents today for follow up. She reports coughing for past two weeks. Reports the cough is productive at times. She reports having chronic sinusitis and currently taking antibiotics (keflex) four times daily. Reports she isn't feeling much better today. Reports taking medications as prescribed.     Review of Systems  Constitutional: Positive for fever and chills.       Reports low grade fever 99-100  HENT: Positive for congestion and sinus pressure. Negative for neck pain and neck stiffness.   Respiratory: Negative for chest tightness and shortness of breath.   Cardiovascular: Negative for chest pain and palpitations.  Gastrointestinal: Negative for nausea, vomiting and abdominal pain.  Neurological: Negative for dizziness, weakness and headaches.       Objective:   Physical Exam  Constitutional: She is oriented to person, place, and time. She appears well-developed and well-nourished.  HENT:  Head: Normocephalic and atraumatic.  Right Ear: External ear normal.  Left Ear: External ear normal.  Nose: Right sinus exhibits maxillary sinus tenderness and frontal sinus tenderness. Left sinus exhibits maxillary sinus tenderness and frontal sinus tenderness.  Mouth/Throat: Posterior oropharyngeal erythema present.  Eyes: EOM are normal. Pupils are equal, round, and reactive to light.  Neck: Normal range of motion. Neck supple. No thyromegaly present.  Cardiovascular: Normal rate, regular rhythm and normal heart sounds.   Pulmonary/Chest: Effort normal and breath sounds normal.  Dry coughing  Lymphadenopathy:    She has no cervical adenopathy.  Neurological: She is alert and oriented to person, place, and time.  Skin: Skin is warm and dry.  Psychiatric: She has a normal mood and affect.    [facility] reading (PRIMARY) by Coralie Keens, [name]-C, no pneumonia noted.                          Assessment & Plan:  1. Cough  - DG Chest 2 View; Future - HYDROcodone-homatropine (HYCODAN) 5-1.5 MG/5ML syrup; Take 5 mLs by mouth every 8 (eight) hours as needed for cough.  Dispense: 120 mL; Refill: 0 - predniSONE (DELTASONE) 50 MG tablet; Take one tablet daily for 4 days  Dispense: 4 tablet; Refill: 0  2. Ethmoid sinusitis  - doxycycline (VIBRA-TABS) 100 MG tablet; Take 1 tablet (100 mg total) by mouth 2 (two) times daily.  Dispense: 20 tablet; Refill: 0  3. Acute bronchitis  - predniSONE (DELTASONE) 50 MG tablet; Take one tablet daily for 4 days  Dispense: 4 tablet; Refill: 0 -increase fluids -RTO if symptoms worsen or unresolved  -Patient verbalized understanding Coralie Keens, [name]-C

## 2013-07-15 NOTE — Patient Instructions (Addendum)

## 2013-07-17 ENCOUNTER — Ambulatory Visit: Payer: 59 | Admitting: Family Medicine

## 2013-09-05 ENCOUNTER — Other Ambulatory Visit: Payer: Self-pay | Admitting: *Deleted

## 2013-09-05 DIAGNOSIS — F411 Generalized anxiety disorder: Secondary | ICD-10-CM

## 2013-09-05 MED ORDER — LORAZEPAM 1 MG PO TABS: 1.0000 mg | ORAL_TABLET | Freq: Two times a day (BID) | ORAL | Status: DC | PRN

## 2013-09-05 MED ORDER — TRAMADOL HCL 50 MG PO TABS: 50.0000 mg | ORAL_TABLET | ORAL | Status: DC | PRN

## 2013-09-05 NOTE — Telephone Encounter (Signed)
rx ready for pickup 

## 2013-09-05 NOTE — Telephone Encounter (Signed)
Do not know last fill date?

## 2013-09-05 NOTE — Telephone Encounter (Signed)
LAST OV 07/15/13. LAST RF 05/16/13.CALL IN THE DRUG STORE.

## 2013-09-06 ENCOUNTER — Telehealth: Payer: Self-pay | Admitting: Nurse Practitioner

## 2013-09-06 NOTE — Telephone Encounter (Signed)
Tramadol rx printed but not the ativan

## 2013-09-06 NOTE — Telephone Encounter (Signed)
Both were done yesterday

## 2013-09-06 NOTE — Telephone Encounter (Signed)
Patient aware.

## 2013-09-08 NOTE — Telephone Encounter (Signed)
All done- patient aware

## 2013-09-09 NOTE — Telephone Encounter (Signed)
rx called into pharmacy

## 2013-10-25 ENCOUNTER — Encounter: Payer: Self-pay | Admitting: Nurse Practitioner

## 2013-10-25 ENCOUNTER — Ambulatory Visit (INDEPENDENT_AMBULATORY_CARE_PROVIDER_SITE_OTHER): Payer: 59 | Admitting: Nurse Practitioner

## 2013-10-25 VITALS — BP 148/89 | HR 93 | Temp 98.7°F | Ht 67.0 in | Wt 227.0 lb

## 2013-10-25 DIAGNOSIS — J019 Acute sinusitis, unspecified: Secondary | ICD-10-CM

## 2013-10-25 DIAGNOSIS — B9689 Other specified bacterial agents as the cause of diseases classified elsewhere: Secondary | ICD-10-CM

## 2013-10-25 MED ORDER — HYDROCODONE-HOMATROPINE 5-1.5 MG/5ML PO SYRP: 5.0000 mL | ORAL_SOLUTION | Freq: Three times a day (TID) | ORAL | Status: DC | PRN

## 2013-10-25 MED ORDER — DOXYCYCLINE HYCLATE 100 MG PO TABS: 100.0000 mg | ORAL_TABLET | Freq: Two times a day (BID) | ORAL | Status: DC

## 2013-10-25 NOTE — Patient Instructions (Signed)

## 2013-10-25 NOTE — Progress Notes (Signed)
   Subjective:    Patient ID: Brenda Cruz, female    DOB: Nov 10, 1965, 48 y.o.   MRN: [id number]  HPI Patient in today c/o cough and congestion- facial swelling on right.    Review of Systems  Constitutional: Positive for fever (intermittent). Negative for chills and appetite change.  HENT: Positive for congestion, ear pain (right), rhinorrhea and sinus pressure. Negative for sore throat and trouble swallowing.   Respiratory: Positive for cough (productive at times).   Cardiovascular: Negative.   Neurological: Positive for headaches.  All other systems reviewed and are negative.       Objective:   Physical Exam  Constitutional: She is oriented to person, place, and time. She appears well-developed.  HENT:  Right Ear: Hearing, tympanic membrane, external ear and ear canal normal.  Left Ear: Hearing, tympanic membrane, external ear and ear canal normal.  Nose: Mucosal edema and rhinorrhea present. Right sinus exhibits maxillary sinus tenderness. Right sinus exhibits no frontal sinus tenderness. Left sinus exhibits maxillary sinus tenderness. Left sinus exhibits no frontal sinus tenderness.  Mouth/Throat: Uvula is midline, oropharynx is clear and moist and mucous membranes are normal.  Cardiovascular: Normal rate, regular rhythm and normal heart sounds.   Pulmonary/Chest: Effort normal and breath sounds normal.  Neurological: She is alert and oriented to person, place, and time.  Skin: Skin is warm and dry.  Psychiatric: She has a normal mood and affect. Her behavior is normal. Judgment and thought content normal.   BP 148/89  Pulse 93  Temp(Src) 98.7 F (37.1 C) (Oral)  Ht 5\' 7"  (1.702 m)  Wt 227 lb (102.967 kg)  BMI 35.55 kg/m2        Assessment & Plan:   1. Acute bacterial rhinosinusitis    Meds ordered this encounter  Medications  . doxycycline (VIBRA-TABS) 100 MG tablet    Sig: Take 1 tablet (100 mg total) by mouth 2 (two) times daily.    Dispense:  20 tablet     Refill:  0    Order Specific Question:  Supervising Provider    Answer:  Chipper Herb [[id number]]  . HYDROcodone-homatropine (HYCODAN) 5-1.5 MG/5ML syrup    Sig: Take 5 mLs by mouth every 8 (eight) hours as needed for cough.    Dispense:  120 mL    Refill:  0    Order Specific Question:  Supervising Provider    Answer:  Chipper Herb [[id number]]   1. Take meds as prescribed 2. Use a cool mist humidifier especially during the winter months and when heat has  been humid. 3. Use saline nose sprays frequently 4. Saline irrigations of the nose can be very helpful if done frequently.  * 4X daily for 1 week*  * Use of a nettie pot can be helpful with this. Follow directions with this* 5. Drink plenty of fluids 6. Keep thermostat turn down low 7.For any cough or congestion  Use plain Mucinex- regular strength or max strength is fine   * Children- consult with Pharmacist for dosing 8. For fever or aces or pains- take tylenol or ibuprofen appropriate for age and weight.  * for fevers greater than 101 orally you may alternate ibuprofen and tylenol every  3 hours.   [name] [name], [name]

## 2013-11-25 ENCOUNTER — Other Ambulatory Visit: Payer: Self-pay

## 2013-11-25 DIAGNOSIS — F411 Generalized anxiety disorder: Secondary | ICD-10-CM

## 2013-11-25 MED ORDER — LORAZEPAM 1 MG PO TABS: 1.0000 mg | ORAL_TABLET | Freq: Two times a day (BID) | ORAL | Status: DC | PRN

## 2013-11-25 NOTE — Telephone Encounter (Signed)
Called into the drug store [facility]

## 2013-11-25 NOTE — Telephone Encounter (Signed)
Last seen 10/25/13  MMM  If approved route to [facility]

## 2013-11-25 NOTE — Telephone Encounter (Signed)
Please call in xanax with 1 refills 

## 2014-01-31 ENCOUNTER — Other Ambulatory Visit: Payer: Self-pay | Admitting: Nurse Practitioner

## 2014-02-03 NOTE — Telephone Encounter (Signed)
Patient aware tp pick up

## 2014-02-03 NOTE — Telephone Encounter (Signed)
rx ready for pickup 

## 2014-02-03 NOTE — Telephone Encounter (Signed)
Last ov 1/15. Last refill on Ativan 11/25/13. Last refill on Tramadol 09/06/13. The directions in Epic have Tramadol as one tablet by mouth as needed but the refill request had one po tid prn pain. Please review. Print Tramadol if approved and call in [facility] if approved.

## 2014-03-19 ENCOUNTER — Telehealth: Payer: Self-pay | Admitting: Nurse Practitioner

## 2014-03-19 NOTE — Telephone Encounter (Signed)
appt given for tomorrow with mmm 

## 2014-03-20 ENCOUNTER — Ambulatory Visit (INDEPENDENT_AMBULATORY_CARE_PROVIDER_SITE_OTHER): Payer: 59 | Admitting: Nurse Practitioner

## 2014-03-20 ENCOUNTER — Encounter: Payer: Self-pay | Admitting: Nurse Practitioner

## 2014-03-20 ENCOUNTER — Ambulatory Visit (INDEPENDENT_AMBULATORY_CARE_PROVIDER_SITE_OTHER): Payer: 59

## 2014-03-20 VITALS — BP 152/87 | HR 94 | Temp 98.6°F | Ht 67.0 in | Wt 227.8 lb

## 2014-03-20 DIAGNOSIS — G8929 Other chronic pain: Secondary | ICD-10-CM

## 2014-03-20 DIAGNOSIS — R1011 Right upper quadrant pain: Secondary | ICD-10-CM

## 2014-03-20 DIAGNOSIS — R0602 Shortness of breath: Secondary | ICD-10-CM

## 2014-03-20 NOTE — Progress Notes (Addendum)
   Subjective:    Patient ID: Brenda Cruz, female    DOB: [birth year], 48 y.o.   MRN: [id number]  HPI  Patient in today c/o:  - SOB when laying on er side- says that wakes her up at night and can't catch her breath. CHanging position corrects problem- Has had nodule on ung in past. - Has right up quadrant pain- says that a knot pops out and if she can push it back in it gets better. Had MRI of abdmen which was negative. - Cough- but is a smoker.   Review of Systems  Constitutional: Negative.   HENT: Negative.   Respiratory: Negative.   Cardiovascular: Negative.   Genitourinary: Negative.   Hematological: Negative.   Psychiatric/Behavioral: Negative.   All other systems reviewed and are negative.      Objective:   Physical Exam  Constitutional: She appears well-developed and well-nourished.  Cardiovascular: Normal rate, regular rhythm and normal heart sounds.   Pulmonary/Chest: Effort normal and breath sounds normal.  Abdominal: Soft. Bowel sounds are normal. There is tenderness (mild mid right abdominal pain).  Skin: Skin is warm and dry.  Psychiatric: She has a normal mood and affect. Her behavior is normal. Judgment and thought content normal.   BP 152/87  Pulse 94  Temp(Src) 98.6 F (37 C) (Oral)  Ht 5\' 7"  (1.702 m)  Wt 227 lb 12.8 oz (103.329 kg)  BMI 35.67 kg/m2  Chest x ray- needs to repeat chest Brenda Sites, [name]        Assessment & Plan:  1. SOB (shortness of breath) STOP smoking - DG Chest 2 View; Future - CT Chest W Contrast; Future STOP smoking 2. Chronic RUQ pain Avoid spicy and fatty foods - DG Esophagus; Future  Brenda [name], [name]

## 2014-04-01 ENCOUNTER — Other Ambulatory Visit: Payer: Self-pay | Admitting: Family Medicine

## 2014-04-01 ENCOUNTER — Other Ambulatory Visit: Payer: Self-pay | Admitting: Nurse Practitioner

## 2014-04-02 ENCOUNTER — Other Ambulatory Visit (HOSPITAL_COMMUNITY): Payer: PRIVATE HEALTH INSURANCE

## 2014-04-02 ENCOUNTER — Ambulatory Visit (HOSPITAL_COMMUNITY): Admission: RE | Admit: 2014-04-02 | Payer: 59 | Source: Ambulatory Visit

## 2014-04-02 ENCOUNTER — Other Ambulatory Visit (HOSPITAL_COMMUNITY): Payer: 59

## 2014-04-03 NOTE — Telephone Encounter (Signed)
Last seen 03/20/14, last filled 02/03/14. Both will print

## 2014-04-04 NOTE — Telephone Encounter (Signed)
rx ready for pickup 

## 2014-04-04 NOTE — Telephone Encounter (Signed)
Pt notified that rx is ready to pick up

## 2014-07-02 ENCOUNTER — Ambulatory Visit (INDEPENDENT_AMBULATORY_CARE_PROVIDER_SITE_OTHER): Payer: 59 | Admitting: Family

## 2014-07-02 ENCOUNTER — Ambulatory Visit: Payer: 59 | Admitting: Family Medicine

## 2014-07-02 ENCOUNTER — Encounter: Payer: Self-pay | Admitting: Family

## 2014-07-02 VITALS — BP 145/85 | HR 88 | Temp 98.3°F | Wt 230.0 lb

## 2014-07-02 DIAGNOSIS — R05 Cough: Secondary | ICD-10-CM

## 2014-07-02 DIAGNOSIS — B379 Candidiasis, unspecified: Secondary | ICD-10-CM

## 2014-07-02 DIAGNOSIS — R059 Cough, unspecified: Secondary | ICD-10-CM

## 2014-07-02 DIAGNOSIS — J018 Other acute sinusitis: Secondary | ICD-10-CM

## 2014-07-02 DIAGNOSIS — M545 Low back pain, unspecified: Secondary | ICD-10-CM

## 2014-07-02 LAB — POCT URINALYSIS DIPSTICK
Bilirubin, UA: NEGATIVE
Glucose, UA: NEGATIVE
KETONES UA: NEGATIVE
Nitrite, UA: NEGATIVE
Protein, UA: NEGATIVE
SPEC GRAV UA: 1.01
UROBILINOGEN UA: NEGATIVE
pH, UA: 6.5

## 2014-07-02 LAB — POCT UA - MICROSCOPIC ONLY
Bacteria, U Microscopic: NEGATIVE
CRYSTALS, UR, HPF, POC: NEGATIVE
Casts, Ur, LPF, POC: NEGATIVE
MUCUS UA: NEGATIVE
Yeast, UA: NEGATIVE

## 2014-07-02 MED ORDER — ALBUTEROL SULFATE HFA 108 (90 BASE) MCG/ACT IN AERS: 2.0000 | INHALATION_SPRAY | Freq: Four times a day (QID) | RESPIRATORY_TRACT | Status: DC | PRN

## 2014-07-02 MED ORDER — BENZONATATE 200 MG PO CAPS: 200.0000 mg | ORAL_CAPSULE | Freq: Two times a day (BID) | ORAL | Status: DC | PRN

## 2014-07-02 MED ORDER — HYDROCODONE-HOMATROPINE 5-1.5 MG/5ML PO SYRP
5.0000 mL | ORAL_SOLUTION | Freq: Three times a day (TID) | ORAL | Status: DC | PRN
Start: 2014-07-02 — End: 2014-11-20

## 2014-07-02 MED ORDER — DOXYCYCLINE HYCLATE 100 MG PO TABS: 100.0000 mg | ORAL_TABLET | Freq: Two times a day (BID) | ORAL | Status: DC

## 2014-07-02 MED ORDER — FLUCONAZOLE 150 MG PO TABS: 150.0000 mg | ORAL_TABLET | Freq: Once | ORAL | Status: DC

## 2014-07-02 NOTE — Progress Notes (Signed)
Subjective:    Patient ID: Brenda Cruz, female    DOB: [birth year], 48 y.o.   MRN: [id number]  Sinusitis This is a new problem. The current episode started 1 to 4 weeks ago. The problem has been waxing and waning since onset. The maximum temperature recorded prior to her arrival was 100 - 100.9 F. Her pain is at a severity of 6/10. The pain is mild. Associated symptoms include congestion, coughing, headaches, a hoarse voice, shortness of breath, sinus pressure (Right side), sneezing and a sore throat. Pertinent negatives include no ear pain. Past treatments include antibiotics.      Review of Systems  Constitutional: Negative.   HENT: Positive for congestion, hoarse voice, sinus pressure (Right side), sneezing and sore throat. Negative for ear pain.   Eyes: Negative.   Respiratory: Positive for cough and shortness of breath.   Cardiovascular: Negative.  Negative for palpitations.  Gastrointestinal: Negative.   Endocrine: Negative.   Genitourinary: Negative.   Musculoskeletal: Positive for back pain.  Neurological: Positive for headaches.  Hematological: Negative.   Psychiatric/Behavioral: Negative.   All other systems reviewed and are negative.      Objective:   Physical Exam  Vitals reviewed. Constitutional: She is oriented to person, place, and time. She appears well-developed and well-nourished. No distress.  HENT:  Head: Normocephalic and atraumatic.  Nose: Right sinus exhibits maxillary sinus tenderness and frontal sinus tenderness. Left sinus exhibits maxillary sinus tenderness and frontal sinus tenderness.  Nasal passage erythemas with mild swelling Oropharynx erythemas  Eyes: Pupils are equal, round, and reactive to light.  Neck: Normal range of motion. Neck supple. No thyromegaly present.  Cardiovascular: Normal rate, regular rhythm, normal heart sounds and intact distal pulses.   No murmur heard. Pulmonary/Chest: Effort normal and breath sounds normal. No  respiratory distress. She has no wheezes.  Hoarse voice   Abdominal: Soft. Bowel sounds are normal. She exhibits no distension. There is no tenderness.  Musculoskeletal: Normal range of motion. She exhibits no edema and no tenderness.  Neurological: She is alert and oriented to person, place, and time. She has normal reflexes. No cranial nerve deficit.  Skin: Skin is warm and dry.  Psychiatric: She has a normal mood and affect. Her behavior is normal. Judgment and thought content normal.      BP 145/85  Pulse 88  Temp(Src) 98.3 F (36.8 C) (Oral)  Wt 230 lb (104.327 kg)     Assessment & Plan:  1. Other acute sinusitis -- Take meds as prescribed - Use a cool mist humidifier  -Use saline nose sprays frequently -Saline irrigations of the nose can be very helpful if done frequently.  * 4X daily for 1 week*  * Use of a nettie pot can be helpful with this. Follow directions with this* -Force fluids -For any cough or congestion  Use plain Mucinex- regular strength or max strength is fine   * Children- consult with Pharmacist for dosing -For fever or aces or pains- take tylenol or ibuprofen appropriate for age and weight.  * for fevers greater than 101 orally you may alternate ibuprofen and tylenol every  3 hours. -Throat lozenges if help - benzonatate (TESSALON) 200 MG capsule; Take 1 capsule (200 mg total) by mouth 2 (two) times daily as needed for cough.  Dispense: 20 capsule; Refill: 0 - albuterol (PROVENTIL HFA;VENTOLIN HFA) 108 (90 BASE) MCG/ACT inhaler; Inhale 2 puffs into the lungs every 6 (six) hours as needed for wheezing or shortness of breath.  Dispense: 1 Inhaler; Refill: 2 - doxycycline (VIBRA-TABS) 100 MG tablet; Take 1 tablet (100 mg total) by mouth 2 (two) times daily.  Dispense: 20 tablet; Refill: 0  2. Bilateral low back pain without sciatica - POCT urinalysis dipstick - POCT UA - Microscopic Only  3. Cough - benzonatate (TESSALON) 200 MG capsule; Take 1 capsule  (200 mg total) by mouth 2 (two) times daily as needed for cough.  Dispense: 20 capsule; Refill: 0 - HYDROcodone-homatropine (HYCODAN) 5-1.5 MG/5ML syrup; Take 5 mLs by mouth every 8 (eight) hours as needed for cough.  Dispense: 120 mL; Refill: 0  4. Yeast infection -Diflucan 150 table once    Evelina Dun, [name]

## 2014-07-02 NOTE — Patient Instructions (Signed)

## 2014-08-08 ENCOUNTER — Other Ambulatory Visit: Payer: Self-pay | Admitting: Nurse Practitioner

## 2014-10-22 ENCOUNTER — Other Ambulatory Visit: Payer: Self-pay | Admitting: Nurse Practitioner

## 2014-10-23 NOTE — Telephone Encounter (Signed)
Last seen 07/02/14 [name]  If approved route to nurse to [facility]

## 2014-10-23 NOTE — Telephone Encounter (Signed)
[facility] 

## 2014-10-23 NOTE — Telephone Encounter (Signed)
Please call in ativan with 1 refills 

## 2014-11-19 ENCOUNTER — Telehealth: Payer: Self-pay | Admitting: Nurse Practitioner

## 2014-11-19 NOTE — Telephone Encounter (Signed)
appt given for tomorrow per patients request

## 2014-11-20 ENCOUNTER — Encounter: Payer: Self-pay | Admitting: Family Medicine

## 2014-11-20 ENCOUNTER — Ambulatory Visit (INDEPENDENT_AMBULATORY_CARE_PROVIDER_SITE_OTHER): Payer: BLUE CROSS/BLUE SHIELD

## 2014-11-20 ENCOUNTER — Ambulatory Visit (INDEPENDENT_AMBULATORY_CARE_PROVIDER_SITE_OTHER): Payer: BLUE CROSS/BLUE SHIELD | Admitting: Family Medicine

## 2014-11-20 VITALS — BP 162/89 | HR 117 | Temp 97.2°F | Wt 229.0 lb

## 2014-11-20 DIAGNOSIS — M25572 Pain in left ankle and joints of left foot: Secondary | ICD-10-CM

## 2014-11-20 DIAGNOSIS — J018 Other acute sinusitis: Secondary | ICD-10-CM | POA: Diagnosis not present

## 2014-11-20 MED ORDER — DOXYCYCLINE HYCLATE 100 MG PO TABS: 100.0000 mg | ORAL_TABLET | Freq: Two times a day (BID) | ORAL | Status: DC

## 2014-11-20 MED ORDER — NAPROXEN 500 MG PO TABS
500.0000 mg | ORAL_TABLET | Freq: Two times a day (BID) | ORAL | Status: DC
Start: 2014-11-20 — End: 2015-11-06

## 2014-11-20 NOTE — Progress Notes (Signed)
   Subjective:    Patient ID: Brenda Cruz, female    DOB: Feb 15, 1966, 49 y.o.   MRN: [id number]  HPI Patient is here with c/o facial discomfort and mucopurulent nasal drainage.  She is also having left foot pain after an injury and it is affecting the left heel.    Review of Systems  Constitutional: Negative for fever.  HENT: Negative for ear pain.   Eyes: Negative for discharge.  Respiratory: Negative for cough.   Cardiovascular: Negative for chest pain.  Gastrointestinal: Negative for abdominal distention.  Endocrine: Negative for polyuria.  Genitourinary: Negative for difficulty urinating.  Musculoskeletal: Negative for gait problem and neck pain.  Skin: Negative for color change and rash.  Neurological: Negative for speech difficulty and headaches.  Psychiatric/Behavioral: Negative for agitation.       Objective:    BP 162/89 mmHg  Pulse 117  Temp(Src) 97.2 F (36.2 C) (Oral)  Wt 229 lb (103.874 kg)  LMP 10/25/2014 Physical Exam  Constitutional: She is oriented to person, place, and time. She appears well-developed and well-nourished.  HENT:  Head: Normocephalic and atraumatic.  Mouth/Throat: Oropharynx is clear and moist.  Eyes: Pupils are equal, round, and reactive to light.  Neck: Normal range of motion. Neck supple.  Cardiovascular: Normal rate and regular rhythm.   No murmur heard. Pulmonary/Chest: Effort normal and breath sounds normal.  Abdominal: Soft. Bowel sounds are normal. There is no tenderness.  Musculoskeletal:  +TTP left heel  Neurological: She is alert and oriented to person, place, and time.  Skin: Skin is warm and dry.  Psychiatric: She has a normal mood and affect.   Xray left foot - no fracture       Assessment & Plan:     ICD-9-CM ICD-10-CM   1. Pain in joint, ankle and foot, left 719.47 M25.572 DG Foot Complete Left     doxycycline (VIBRA-TABS) 100 MG tablet     naproxen (NAPROSYN) 500 MG tablet  2. Other acute sinusitis 461.8  J01.80 doxycycline (VIBRA-TABS) 100 MG tablet     No Follow-up on file.  Lysbeth Penner [name]

## 2015-03-01 ENCOUNTER — Other Ambulatory Visit: Payer: Self-pay | Admitting: Nurse Practitioner

## 2015-03-02 NOTE — Telephone Encounter (Signed)
rx called into pharmacy

## 2015-03-02 NOTE — Telephone Encounter (Signed)
no more refills without being seen rx ready for pick up

## 2015-03-02 NOTE — Telephone Encounter (Signed)
Last seen by [facility] 11/20/14. Ativan last filled 10/23/14. Tramadol last filled 06/13/15

## 2015-05-22 ENCOUNTER — Ambulatory Visit (INDEPENDENT_AMBULATORY_CARE_PROVIDER_SITE_OTHER): Payer: BLUE CROSS/BLUE SHIELD

## 2015-05-22 ENCOUNTER — Ambulatory Visit (INDEPENDENT_AMBULATORY_CARE_PROVIDER_SITE_OTHER): Payer: BLUE CROSS/BLUE SHIELD | Admitting: Physician Assistant

## 2015-05-22 ENCOUNTER — Encounter: Payer: Self-pay | Admitting: Physician Assistant

## 2015-05-22 VITALS — BP 154/97 | HR 89 | Temp 97.3°F | Ht 67.0 in | Wt 228.0 lb

## 2015-05-22 DIAGNOSIS — R109 Unspecified abdominal pain: Secondary | ICD-10-CM

## 2015-05-22 DIAGNOSIS — B373 Candidiasis of vulva and vagina: Secondary | ICD-10-CM

## 2015-05-22 DIAGNOSIS — B3731 Acute candidiasis of vulva and vagina: Secondary | ICD-10-CM

## 2015-05-22 DIAGNOSIS — N3090 Cystitis, unspecified without hematuria: Secondary | ICD-10-CM

## 2015-05-22 DIAGNOSIS — R399 Unspecified symptoms and signs involving the genitourinary system: Secondary | ICD-10-CM

## 2015-05-22 LAB — POCT UA - MICROSCOPIC ONLY
Casts, Ur, LPF, POC: NEGATIVE
Crystals, Ur, HPF, POC: NEGATIVE

## 2015-05-22 LAB — POCT URINALYSIS DIPSTICK
Bilirubin, UA: NEGATIVE
GLUCOSE UA: NEGATIVE
Ketones, UA: NEGATIVE
Nitrite, UA: NEGATIVE
PROTEIN UA: NEGATIVE
Spec Grav, UA: 1.015
UROBILINOGEN UA: NEGATIVE
pH, UA: 7.5

## 2015-05-22 LAB — POCT CBC
GRANULOCYTE PERCENT: 64.7 % (ref 37–80)
HCT, POC: 39.3 % (ref 37.7–47.9)
Hemoglobin: 12.9 g/dL (ref 12.2–16.2)
Lymph, poc: 3.2 (ref 0.6–3.4)
MCH, POC: 29.6 pg (ref 27–31.2)
MCHC: 32.9 g/dL (ref 31.8–35.4)
MCV: 89.8 fL (ref 80–97)
MPV: 8 fL (ref 0–99.8)
POC GRANULOCYTE: 7.6 — AB (ref 2–6.9)
POC LYMPH %: 27.4 % (ref 10–50)
Platelet Count, POC: 276 10*3/uL (ref 142–424)
RBC: 4.37 M/uL (ref 4.04–5.48)
RDW, POC: 13.2 %
WBC: 11.7 10*3/uL — AB (ref 4.6–10.2)

## 2015-05-22 MED ORDER — CIPROFLOXACIN HCL 500 MG PO TABS: 500.0000 mg | ORAL_TABLET | Freq: Two times a day (BID) | ORAL | Status: DC

## 2015-05-22 MED ORDER — FLUCONAZOLE 150 MG PO TABS: ORAL_TABLET | ORAL | Status: DC

## 2015-05-22 NOTE — Progress Notes (Signed)
Subjective:    Patient ID: Brenda Cruz, female    DOB: 05-03-1966, 49 y.o.   MRN: [id number]  HPI 49 y/o female presents for evaluation of back pain and slow stream of urine and urinary frequency. It "stops and starts". She went for a drug screen today and was told that she had protein, blood and leukocytes in her urine. She had a kidney stone 30 years ago.     Review of Systems  Constitutional: Positive for chills and diaphoresis.  HENT: Negative.   Eyes: Negative.   Respiratory: Negative.   Genitourinary: Positive for dysuria, urgency, frequency, flank pain (right side ), decreased urine volume (small amounts, slow stream ), difficulty urinating (has to push , "bear down" at times for urine to drain out) and pelvic pain. Negative for hematuria, vaginal discharge and dyspareunia.       Foul odor   Musculoskeletal: Positive for back pain.  Skin: Negative.        Objective:   Physical Exam  Constitutional: She is oriented to person, place, and time. She appears well-developed and well-nourished. No distress.  HENT:  Head: Normocephalic.  Cardiovascular: Normal rate.   Hypertensive   Pulmonary/Chest: Effort normal and breath sounds normal.  Abdominal: Soft. There is tenderness (suprapubic ). There is no rebound and no guarding.  Musculoskeletal: She exhibits tenderness (right flank). She exhibits no edema.  Neurological: She is alert and oriented to person, place, and time.  Skin: She is not diaphoretic.  Psychiatric: She has a normal mood and affect. Her behavior is normal. Judgment and thought content normal.  Nursing note and vitals reviewed.  Results for orders placed or performed in visit on 05/22/15  POCT urinalysis dipstick  Result Value Ref Range   Color, UA yellow    Clarity, UA cloudy    Glucose, UA neg    Bilirubin, UA neg    Ketones, UA neg    Spec Grav, UA 1.015    Blood, UA mod    pH, UA 7.5    Protein, UA neg    Urobilinogen, UA negative    Nitrite,  UA neg    Leukocytes, UA Trace (A) Negative  POCT UA - Microscopic Only  Result Value Ref Range   WBC, Ur, HPF, POC 5-10    RBC, urine, microscopic 10-12    Bacteria, U Microscopic occ    Mucus, UA mod    Epithelial cells, urine per micros occ    Crystals, Ur, HPF, POC neg    Casts, Ur, LPF, POC neg    Yeast, UA rare   POCT CBC  Result Value Ref Range   WBC 11.7 (A) 4.6 - 10.2 K/uL   Lymph, poc 3.2 0.6 - 3.4   POC LYMPH PERCENT 27.4 10 - 50 %L   POC Granulocyte 7.6 (A) 2 - 6.9   Granulocyte percent 64.7 37 - 80 %G   RBC 4.37 4.04 - 5.48 M/uL   Hemoglobin 12.9 12.2 - 16.2 g/dL   HCT, POC 39.3 37.7 - 47.9 %   MCV 89.8 80 - 97 fL   MCH, POC 29.6 27 - 31.2 pg   MCHC 32.9 31.8 - 35.4 g/dL   RDW, POC 13.2 %   Platelet Count, POC 276 142 - 424 K/uL   MPV 8.0 0 - 99.8 fL          Assessment & Plan:  1. UTI symptoms  - POCT urinalysis dipstick - POCT UA - Microscopic Only -  POCT CBC - CMP14+EGFR - ciprofloxacin (CIPRO) 500 MG tablet; Take 1 tablet (500 mg total) by mouth 2 (two) times daily.  Dispense: 20 tablet; Refill: 0  2. Right flank pain - I feel that this is muscular in etiology since patient has been painting recently. I have advised her to take ibuprofen or aleve as directed  - DG Abd 1 View - POCT CBC - CMP14+EGFR  3. Cystitis  - ciprofloxacin (CIPRO) 500 MG tablet; Take 1 tablet (500 mg total) by mouth 2 (two) times daily.  Dispense: 20 tablet; Refill: 0  4. Vulvovaginal candidiasis  - fluconazole (DIFLUCAN) 150 MG tablet; Take 1 tablet PO on day 1. Repeat in 3 days  Dispense: 2 tablet; Refill: 0   Continue all meds Labs pending  No caffeine Drink lots of  water   RTO 2 week   Cheyenne Bordeaux A. Benjamin Stain PA-C

## 2015-05-23 LAB — CMP14+EGFR
ALK PHOS: 79 IU/L (ref 39–117)
ALT: 23 IU/L (ref 0–32)
AST: 20 IU/L (ref 0–40)
Albumin/Globulin Ratio: 1.5 (ref 1.1–2.5)
Albumin: 4.1 g/dL (ref 3.5–5.5)
BUN/Creatinine Ratio: 11 (ref 9–23)
BUN: 10 mg/dL (ref 6–24)
CALCIUM: 9 mg/dL (ref 8.7–10.2)
CHLORIDE: 100 mmol/L (ref 97–108)
CO2: 25 mmol/L (ref 18–29)
Creatinine, Ser: 0.93 mg/dL (ref 0.57–1.00)
GFR calc non Af Amer: 74 mL/min/{1.73_m2} (ref >59)
GFR, EST AFRICAN AMERICAN: 85 mL/min/{1.73_m2} (ref >59)
GLUCOSE: 105 mg/dL — AB (ref 65–99)
Globulin, Total: 2.8 g/dL (ref 1.5–4.5)
Potassium: 4.4 mmol/L (ref 3.5–5.2)
Sodium: 140 mmol/L (ref 134–144)
Total Protein: 6.9 g/dL (ref 6.0–8.5)

## 2015-06-10 ENCOUNTER — Encounter: Payer: Self-pay | Admitting: Physician Assistant

## 2015-06-10 ENCOUNTER — Ambulatory Visit (INDEPENDENT_AMBULATORY_CARE_PROVIDER_SITE_OTHER): Payer: BLUE CROSS/BLUE SHIELD | Admitting: Physician Assistant

## 2015-06-10 VITALS — BP 146/87 | HR 106 | Temp 97.9°F | Ht 67.0 in | Wt 227.4 lb

## 2015-06-10 DIAGNOSIS — N3090 Cystitis, unspecified without hematuria: Secondary | ICD-10-CM

## 2015-06-10 DIAGNOSIS — R3919 Other difficulties with micturition: Secondary | ICD-10-CM

## 2015-06-10 DIAGNOSIS — R39198 Other difficulties with micturition: Secondary | ICD-10-CM

## 2015-06-10 LAB — POCT UA - MICROSCOPIC ONLY
CASTS, UR, LPF, POC: NEGATIVE
CRYSTALS, UR, HPF, POC: NEGATIVE
Mucus, UA: NEGATIVE
Yeast, UA: NEGATIVE

## 2015-06-10 LAB — POCT URINALYSIS DIPSTICK
BILIRUBIN UA: NEGATIVE
GLUCOSE UA: NEGATIVE
KETONES UA: NEGATIVE
NITRITE UA: NEGATIVE
PH UA: 6
SPEC GRAV UA: 1.01
Urobilinogen, UA: NEGATIVE

## 2015-06-10 MED ORDER — LEVOFLOXACIN 500 MG PO TABS: 500.0000 mg | ORAL_TABLET | Freq: Every day | ORAL | Status: DC

## 2015-06-10 NOTE — Progress Notes (Signed)
   Subjective:    Patient ID: Brenda Cruz, female    DOB: 28-Feb-1966, 49 y.o.   MRN: [id number]  HPI 49 y/o female presents for f/u of uti. She has completed Cipro 500mg  BID x 10 days. She states that she is feeling better but still having some back pain     Review of Systems  Constitutional: Negative.   HENT: Negative.   Eyes: Negative.   Respiratory: Negative.   Cardiovascular: Negative.   Gastrointestinal: Negative.   Endocrine: Negative.   Genitourinary:       Slow stream and interrupted stream of urine        Objective:   Physical Exam  Constitutional: She is oriented to person, place, and time.  Abdominal: Soft. Bowel sounds are normal. She exhibits no distension and no mass. There is no tenderness. There is no rebound and no guarding.  Negative for CVA ttp   Musculoskeletal: She exhibits no edema or tenderness.  Neurological: She is alert and oriented to person, place, and time.  Psychiatric: She has a normal mood and affect. Her behavior is normal. Judgment and thought content normal.     Results for orders placed or performed in visit on 06/10/15  Urine culture  Result Value Ref Range   Urine Culture, Routine Final report    Urine Culture result 1 Comment   POCT urinalysis dipstick  Result Value Ref Range   Color, UA gold    Clarity, UA clear    Glucose, UA neg    Bilirubin, UA neg    Ketones, UA neg    Spec Grav, UA 1.010    Blood, UA trace    pH, UA 6.0    Protein, UA trace    Urobilinogen, UA negative    Nitrite, UA neg    Leukocytes, UA moderate (2+) (A) Negative  POCT UA - Microscopic Only  Result Value Ref Range   WBC, Ur, HPF, POC 10-20    RBC, urine, microscopic 5-6    Bacteria, U Microscopic many    Mucus, UA neg    Epithelial cells, urine per micros mod    Crystals, Ur, HPF, POC neg    Casts, Ur, LPF, POC neg    Yeast, UA neg         Assessment & Plan:  1. Slow urinary stream  - Urine culture - POCT urinalysis dipstick -  POCT UA - Microscopic Only - levofloxacin (LEVAQUIN) 500 MG tablet; Take 1 tablet (500 mg total) by mouth daily.  Dispense: 7 tablet; Refill: 0  2. Cystitis  - Urine culture - POCT urinalysis dipstick - POCT UA - Microscopic Only - levofloxacin (LEVAQUIN) 500 MG tablet; Take 1 tablet (500 mg total) by mouth daily.  Dispense: 7 tablet; Refill: 0   Continue all meds Labs pending Health Maintenance reviewed Diet and exercise encouraged RTO prn if s/s worsen or do not return. Will change antibiotic if needed pending culture results. Possible referral to urology if symptoms do not improve   Devanshi Califf A. Benjamin Stain PA-C

## 2015-06-12 LAB — URINE CULTURE

## 2015-06-16 ENCOUNTER — Encounter: Payer: Self-pay | Admitting: Physician Assistant

## 2015-06-23 ENCOUNTER — Ambulatory Visit (INDEPENDENT_AMBULATORY_CARE_PROVIDER_SITE_OTHER): Payer: BLUE CROSS/BLUE SHIELD | Admitting: Physician Assistant

## 2015-06-23 ENCOUNTER — Encounter: Payer: Self-pay | Admitting: Physician Assistant

## 2015-06-23 VITALS — BP 128/78 | HR 76 | Temp 97.1°F | Ht 67.0 in | Wt 228.0 lb

## 2015-06-23 DIAGNOSIS — Z87440 Personal history of urinary (tract) infections: Secondary | ICD-10-CM | POA: Diagnosis not present

## 2015-06-23 DIAGNOSIS — N390 Urinary tract infection, site not specified: Secondary | ICD-10-CM | POA: Diagnosis not present

## 2015-06-23 LAB — POCT URINALYSIS DIPSTICK
Bilirubin, UA: NEGATIVE
Blood, UA: NEGATIVE
Glucose, UA: NEGATIVE
KETONES UA: NEGATIVE
Nitrite, UA: NEGATIVE
PH UA: 5
PROTEIN UA: NEGATIVE
SPEC GRAV UA: 1.015
UROBILINOGEN UA: NEGATIVE

## 2015-06-23 LAB — POCT UA - MICROSCOPIC ONLY
CASTS, UR, LPF, POC: NEGATIVE
Crystals, Ur, HPF, POC: NEGATIVE
Mucus, UA: NEGATIVE
RBC, urine, microscopic: NEGATIVE
YEAST UA: NEGATIVE

## 2015-06-23 NOTE — Progress Notes (Signed)
   Subjective:    Patient ID: Brenda Cruz, female    DOB: [birth year], 49 y.o.   MRN: [id number]  HPI 49 y/o female presents for f/u of recurrent UTI. She is not having symptoms after being treated with Levaquin x 7 days.    Review of Systems  Constitutional: Negative.   HENT: Negative.   Eyes: Negative.   Respiratory: Negative.   Cardiovascular: Negative.   Gastrointestinal: Negative.   Endocrine: Negative.   Genitourinary: Negative.   Musculoskeletal: Positive for back pain.  Skin: Negative.   Allergic/Immunologic: Negative.   Neurological: Negative.   Hematological: Negative.   Psychiatric/Behavioral: Negative.        Objective:   Physical Exam  Constitutional: She is oriented to person, place, and time.  Neurological: She is alert and oriented to person, place, and time.  Psychiatric: She has a normal mood and affect. Her behavior is normal. Judgment and thought content normal.   Results for orders placed or performed in visit on 06/23/15  POCT urinalysis dipstick  Result Value Ref Range   Color, UA yellow    Clarity, UA clear    Glucose, UA neg    Bilirubin, UA neg    Ketones, UA neg    Spec Grav, UA 1.015    Blood, UA neg    pH, UA 5.0    Protein, UA neg    Urobilinogen, UA negative    Nitrite, UA neg    Leukocytes, UA Trace (A) Negative  POCT UA - Microscopic Only  Result Value Ref Range   WBC, Ur, HPF, POC 5-7    RBC, urine, microscopic neg    Bacteria, U Microscopic rare    Mucus, UA neg    Epithelial cells, urine per micros occ    Crystals, Ur, HPF, POC neg    Casts, Ur, LPF, POC neg    Yeast, UA neg           Assessment & Plan:  1. History of UTI  - POCT urinalysis dipstick - POCT UA - Microscopic Only - Urine culture  2. Recurrent UTI Trace of leukocytes present today. I do not feel treatment is needed. However, if urine culture indicates, I will treat based on resistance. If symptoms recur, I will likely refer to Urology for  additional assessment.      Addalynne Golding A. Benjamin Stain PA-C

## 2015-06-25 LAB — URINE CULTURE

## 2015-08-01 ENCOUNTER — Ambulatory Visit (INDEPENDENT_AMBULATORY_CARE_PROVIDER_SITE_OTHER): Payer: BLUE CROSS/BLUE SHIELD | Admitting: Pediatrics

## 2015-08-01 VITALS — BP 148/77 | HR 86 | Temp 97.3°F | Ht 67.0 in | Wt 227.6 lb

## 2015-08-01 DIAGNOSIS — J018 Other acute sinusitis: Secondary | ICD-10-CM

## 2015-08-01 MED ORDER — LEVOFLOXACIN 500 MG PO TABS: 500.0000 mg | ORAL_TABLET | Freq: Every day | ORAL | Status: DC

## 2015-08-01 NOTE — Progress Notes (Signed)
Subjective:    Patient ID: Brenda Cruz, female    DOB: 04/23/1966, 49 y.o.   MRN: [id number]  CC: URI symptoms  HPI: Brenda Cruz is a 49 y.o. female presenting on 08/01/2015 for Cough; Nasal Congestion; Shortness of Breath; Fever; Chills; and Sore Throat  Sick for the past 12 days. Nasal congestion, fever. Not taken temp at home, subjective Sore throat Nothing has improved over past week. Taking nyquil, still coughing a lot at night Taking ibuprofen and tylenol around the clock to stay afebrile so she can continue to work Appetite ok. Gets chronic sinusiis and bronchitis often   Relevant past medical, surgical, family and social history reviewed and updated as indicated. Interim medical history since our last visit reviewed. Allergies and medications reviewed and updated.   ROS: Per HPI unless specifically indicated above  Past Medical History Patient Active Problem List   Diagnosis Date Noted  . GERD (gastroesophageal reflux disease) 05/16/2013  . Depression 05/16/2013  . GAD (generalized anxiety disorder) 05/16/2013    Current Outpatient Prescriptions  Medication Sig Dispense Refill  . albuterol (PROVENTIL HFA;VENTOLIN HFA) 108 (90 BASE) MCG/ACT inhaler Inhale 2 puffs into the lungs every 6 (six) hours as needed for wheezing or shortness of breath. 1 Inhaler 2  . ALBUTEROL IN Inhale into the lungs.    Marland Kitchen FLUoxetine (PROZAC) 20 MG capsule TAKE ONE (1) CAPSULE EACH DAY 30 capsule 2  . LORazepam (ATIVAN) 1 MG tablet TAKE 1 TABLET TWICE DAILY AS NEEDED FOR ANXIETY 60 tablet 0  . Mometasone Furoate (NASONEX NA) Place into the nose as needed.      . naproxen (NAPROSYN) 500 MG tablet Take 1 tablet (500 mg total) by mouth 2 (two) times daily with a meal. 20 tablet 0  . NEXIUM 40 MG capsule TAKE ONE (1) CAPSULE EACH DAY 30 capsule 4  . traMADol (ULTRAM) 50 MG tablet TAKE 1 TABLET 3 TIMES DAILY AS NEEDED FOR PAIN 60 tablet 0  . levofloxacin (LEVAQUIN) 500 MG tablet  Take 1 tablet (500 mg total) by mouth daily. 7 tablet 0   No current facility-administered medications for this visit.       Objective:    BP 148/77 mmHg  Pulse 86  Temp(Src) 97.3 F (36.3 C) (Oral)  Ht 5\' 7"  (1.702 m)  Wt 227 lb 9.6 oz (103.239 kg)  BMI 35.64 kg/m2  Wt Readings from Last 3 Encounters:  08/01/15 227 lb 9.6 oz (103.239 kg)  06/23/15 228 lb (103.42 kg)  06/10/15 227 lb 6.4 oz (103.148 kg)    Gen: NAD, alert, cooperative with exam, congested EYES: EOMI, no scleral injection or icterus ENT:  TMs dull, non-erythematous b/l, OP with mild erythema, tender over maxillary sinuses b/l LYMPH: no cervical LAD CV: NRRR, normal S1/S2, no murmur, distal pulses 2+ b/l Resp: CTABL, no wheezes, normal WOB, no rales Ext: No edema, warm Neuro: Alert and oriented     Assessment & Plan:    Brenda Cruz was seen today for cough, nasal congestion, shortness of breath, fever, chills and sore throat, 12 days of symptoms with no improvement, still with some fevers, significant nasal congestion and sinus pressure. Normal lung exam. Will treat with levofloxacin, as allergic to sulfa, PCN, and azithromycin. Symptomatic care, netipot BID, continue NSAIDs, tylenol.   Diagnoses and all orders for this visit:  Other acute sinusitis -     Discontinue: levofloxacin (LEVAQUIN) 500 MG tablet; Take 1 tablet (500 mg total) by mouth daily. -  levofloxacin (LEVAQUIN) 500 MG tablet; Take 1 tablet (500 mg total) by mouth daily.     Follow up plan: No Follow-up on file.  Assunta Found, MD [facility] 08/01/2015, 11:31 AM

## 2015-09-12 ENCOUNTER — Other Ambulatory Visit: Payer: Self-pay | Admitting: Nurse Practitioner

## 2015-09-14 NOTE — Telephone Encounter (Signed)
Last seen 08/01/15  Dr Evette Doffing   If approved print

## 2015-09-14 NOTE — Telephone Encounter (Signed)
Patient aware that she will need to be seen  

## 2015-09-14 NOTE — Telephone Encounter (Signed)
She would need to be seen for those prescriptions to be filled. Being seen does not guarantee that she would get those prescriptions but those are medicines we cannot prescribe by phone without an appointment and we did not talk about the symptoms/diagnoses she takes those medicines for at her last visit with me.

## 2015-09-23 ENCOUNTER — Ambulatory Visit: Payer: BLUE CROSS/BLUE SHIELD | Admitting: Pediatrics

## 2015-10-06 ENCOUNTER — Ambulatory Visit: Payer: BLUE CROSS/BLUE SHIELD | Admitting: Nurse Practitioner

## 2015-10-16 ENCOUNTER — Encounter: Payer: Self-pay | Admitting: Nurse Practitioner

## 2015-10-16 ENCOUNTER — Ambulatory Visit (INDEPENDENT_AMBULATORY_CARE_PROVIDER_SITE_OTHER): Payer: BLUE CROSS/BLUE SHIELD | Admitting: Nurse Practitioner

## 2015-10-16 DIAGNOSIS — F329 Major depressive disorder, single episode, unspecified: Secondary | ICD-10-CM

## 2015-10-16 DIAGNOSIS — K219 Gastro-esophageal reflux disease without esophagitis: Secondary | ICD-10-CM

## 2015-10-16 DIAGNOSIS — F411 Generalized anxiety disorder: Secondary | ICD-10-CM | POA: Diagnosis not present

## 2015-10-16 DIAGNOSIS — R079 Chest pain, unspecified: Secondary | ICD-10-CM | POA: Diagnosis not present

## 2015-10-16 DIAGNOSIS — I10 Essential (primary) hypertension: Secondary | ICD-10-CM | POA: Diagnosis not present

## 2015-10-16 DIAGNOSIS — F32A Depression, unspecified: Secondary | ICD-10-CM

## 2015-10-16 MED ORDER — ESOMEPRAZOLE MAGNESIUM 40 MG PO CPDR: DELAYED_RELEASE_CAPSULE | ORAL | Status: DC

## 2015-10-16 MED ORDER — LORAZEPAM 1 MG PO TABS: ORAL_TABLET | ORAL | Status: DC

## 2015-10-16 MED ORDER — FLUOXETINE HCL 20 MG PO CAPS: ORAL_CAPSULE | ORAL | Status: DC

## 2015-10-16 MED ORDER — LISINOPRIL 20 MG PO TABS: 20.0000 mg | ORAL_TABLET | Freq: Every day | ORAL | Status: DC

## 2015-10-16 NOTE — Patient Instructions (Signed)

## 2015-10-16 NOTE — Progress Notes (Signed)
Subjective:    Patient ID: Brenda Cruz, female    DOB: 09/29/1966, 49 y.o.   MRN: [id number]  HPI  Patient in today for follow up up chronic medical problems- she is c/o chest pain and rates 7-8/10 at times- describes pain as pressure with SOB- only occurs when she is walking long distances. Continues until she sits down to rest. She has a strong family history of heart disease. Current medical problems: GERD-  nexium 40 mg daily- working well Depression/GAD- she is on prozac 20mg daily and ativan 1mg BID- says that she has been under a lot of stress.  Review of Systems  Constitutional: Negative.   HENT: Negative.   Respiratory: Negative.   Cardiovascular: Negative.   Genitourinary: Negative.   Neurological: Negative.   Psychiatric/Behavioral: Negative.   All other systems reviewed and are negative.      Objective:   Physical Exam  Constitutional: She is oriented to person, place, and time. She appears well-developed and well-nourished.  Cardiovascular: Normal rate, regular rhythm and normal heart sounds.   Pulmonary/Chest: Effort normal and breath sounds normal.  Neurological: She is alert and oriented to person, place, and time.  Skin: Skin is warm and dry.  Psychiatric: She has a normal mood and affect. Her behavior is normal. Judgment and thought content normal.   BP 159/82 mmHg  Pulse 98  Temp(Src) 97.5 F (36.4 C) (Oral)  Ht 5' 7" (1.702 m)  Wt 228 lb 6.4 oz (103.602 kg)  BMI 35.76 kg/m2  LMP 10/10/2015        Assessment & Plan:  1. Gastroesophageal reflux disease without esophagitis Avoid spicy foods Do not eat 2 hours prior to bedtime - esomeprazole (NEXIUM) 40 MG capsule; TAKE ONE (1) CAPSULE EACH DAY  Dispense: 30 capsule; Refill: 4  2. Depression Stress management - FLUoxetine (PROZAC) 20 MG capsule; TAKE ONE (1) CAPSULE EACH DAY  Dispense: 30 capsule; Refill: 2  3. GAD (generalized anxiety disorder) Stress management - LORazepam (ATIVAN) 1 MG  tablet; TAKE 1 TABLET TWICE DAILY AS NEEDED FOR ANXIETY  Dispense: 60 tablet; Refill: 1  4. Essential hypertension Do not add salt to diet - lisinopril (PRINIVIL,ZESTRIL) 20 MG tablet; Take 1 tablet (20 mg total) by mouth daily.  Dispense: 90 tablet; Refill: 3 - CMP14+EGFR - Lipid panel  5. Morbid obesity, unspecified obesity type (HCC) Discussed diet and exercise for person with BMI >25 Will recheck weight in 3-6 months  6. Chest pain, unspecified chest pain type No strenous exercise - EKG 12-Lead - Exercise Tolerance Test; Future    Labs pending Health maintenance reviewed Diet and exercise encouraged Continue all meds Follow up  In 3 months   [name] , [name]     

## 2015-10-17 LAB — CMP14+EGFR
A/G RATIO: 1.7 (ref 1.1–2.5)
ALBUMIN: 4.3 g/dL (ref 3.5–5.5)
ALK PHOS: 79 IU/L (ref 39–117)
ALT: 18 IU/L (ref 0–32)
AST: 19 IU/L (ref 0–40)
BUN / CREAT RATIO: 7 — AB (ref 9–23)
BUN: 6 mg/dL (ref 6–24)
Bilirubin Total: <0.2 mg/dL (ref 0.0–1.2)
CALCIUM: 8.5 mg/dL — AB (ref 8.7–10.2)
CO2: 25 mmol/L (ref 18–29)
CREATININE: 0.81 mg/dL (ref 0.57–1.00)
Chloride: 99 mmol/L (ref 96–106)
GFR calc Af Amer: 100 mL/min/{1.73_m2} (ref >59)
GFR, EST NON AFRICAN AMERICAN: 87 mL/min/{1.73_m2} (ref >59)
GLOBULIN, TOTAL: 2.5 g/dL (ref 1.5–4.5)
Glucose: 171 mg/dL — ABNORMAL HIGH (ref 65–99)
POTASSIUM: 3.9 mmol/L (ref 3.5–5.2)
SODIUM: 138 mmol/L (ref 134–144)
Total Protein: 6.8 g/dL (ref 6.0–8.5)

## 2015-10-17 LAB — LIPID PANEL
CHOL/HDL RATIO: 10.5 ratio — AB (ref 0.0–4.4)
Cholesterol, Total: 251 mg/dL — ABNORMAL HIGH (ref 100–199)
HDL: 24 mg/dL — ABNORMAL LOW (ref >39)
LDL CALC: 163 mg/dL — AB (ref 0–99)
TRIGLYCERIDES: 318 mg/dL — AB (ref 0–149)
VLDL Cholesterol Cal: 64 mg/dL — ABNORMAL HIGH (ref 5–40)

## 2015-10-20 ENCOUNTER — Other Ambulatory Visit: Payer: Self-pay | Admitting: Nurse Practitioner

## 2015-10-20 ENCOUNTER — Telehealth: Payer: Self-pay

## 2015-10-20 MED ORDER — ATORVASTATIN CALCIUM 40 MG PO TABS: 40.0000 mg | ORAL_TABLET | Freq: Every day | ORAL | Status: DC

## 2015-10-22 NOTE — Telephone Encounter (Signed)
error 

## 2015-10-29 ENCOUNTER — Emergency Department (HOSPITAL_COMMUNITY): Payer: Managed Care, Other (non HMO)

## 2015-10-29 ENCOUNTER — Encounter (HOSPITAL_COMMUNITY)
Admission: EM | Disposition: A | Discharge status: Home / Self Care | Payer: Managed Care, Other (non HMO) | Source: Home / Self Care | Attending: Thoracic Surgery (Cardiothoracic Vascular Surgery)

## 2015-10-29 ENCOUNTER — Inpatient Hospital Stay (HOSPITAL_COMMUNITY)
Admission: EM | Admit: 2015-10-29 | Discharge: 2015-11-06 | DRG: 234 | Disposition: A | Discharge status: Home / Self Care | Payer: Managed Care, Other (non HMO) | Attending: Thoracic Surgery (Cardiothoracic Vascular Surgery) | Admitting: Thoracic Surgery (Cardiothoracic Vascular Surgery)

## 2015-10-29 ENCOUNTER — Encounter (HOSPITAL_COMMUNITY): Payer: Self-pay | Admitting: *Deleted

## 2015-10-29 ENCOUNTER — Other Ambulatory Visit: Payer: Self-pay | Admitting: *Deleted

## 2015-10-29 DIAGNOSIS — Z880 Allergy status to penicillin: Secondary | ICD-10-CM | POA: Diagnosis not present

## 2015-10-29 DIAGNOSIS — Z881 Allergy status to other antibiotic agents status: Secondary | ICD-10-CM

## 2015-10-29 DIAGNOSIS — Z981 Arthrodesis status: Secondary | ICD-10-CM | POA: Diagnosis not present

## 2015-10-29 DIAGNOSIS — F411 Generalized anxiety disorder: Secondary | ICD-10-CM | POA: Diagnosis present

## 2015-10-29 DIAGNOSIS — I2583 Coronary atherosclerosis due to lipid rich plaque: Secondary | ICD-10-CM | POA: Diagnosis present

## 2015-10-29 DIAGNOSIS — E785 Hyperlipidemia, unspecified: Secondary | ICD-10-CM | POA: Diagnosis present

## 2015-10-29 DIAGNOSIS — K219 Gastro-esophageal reflux disease without esophagitis: Secondary | ICD-10-CM | POA: Diagnosis present

## 2015-10-29 DIAGNOSIS — R079 Chest pain, unspecified: Secondary | ICD-10-CM | POA: Diagnosis present

## 2015-10-29 DIAGNOSIS — E669 Obesity, unspecified: Secondary | ICD-10-CM | POA: Diagnosis present

## 2015-10-29 DIAGNOSIS — Z888 Allergy status to other drugs, medicaments and biological substances status: Secondary | ICD-10-CM

## 2015-10-29 DIAGNOSIS — I2510 Atherosclerotic heart disease of native coronary artery without angina pectoris: Secondary | ICD-10-CM | POA: Diagnosis present

## 2015-10-29 DIAGNOSIS — I10 Essential (primary) hypertension: Secondary | ICD-10-CM | POA: Diagnosis present

## 2015-10-29 DIAGNOSIS — Z6835 Body mass index (BMI) 35.0-35.9, adult: Secondary | ICD-10-CM | POA: Diagnosis not present

## 2015-10-29 DIAGNOSIS — D62 Acute posthemorrhagic anemia: Secondary | ICD-10-CM | POA: Diagnosis not present

## 2015-10-29 DIAGNOSIS — I25119 Atherosclerotic heart disease of native coronary artery with unspecified angina pectoris: Secondary | ICD-10-CM | POA: Insufficient documentation

## 2015-10-29 DIAGNOSIS — J45909 Unspecified asthma, uncomplicated: Secondary | ICD-10-CM | POA: Diagnosis present

## 2015-10-29 DIAGNOSIS — F329 Major depressive disorder, single episode, unspecified: Secondary | ICD-10-CM | POA: Diagnosis present

## 2015-10-29 DIAGNOSIS — I209 Angina pectoris, unspecified: Secondary | ICD-10-CM

## 2015-10-29 DIAGNOSIS — Z79899 Other long term (current) drug therapy: Secondary | ICD-10-CM | POA: Diagnosis not present

## 2015-10-29 DIAGNOSIS — Z8249 Family history of ischemic heart disease and other diseases of the circulatory system: Secondary | ICD-10-CM | POA: Diagnosis not present

## 2015-10-29 DIAGNOSIS — E1165 Type 2 diabetes mellitus with hyperglycemia: Secondary | ICD-10-CM | POA: Diagnosis present

## 2015-10-29 DIAGNOSIS — J9811 Atelectasis: Secondary | ICD-10-CM | POA: Diagnosis not present

## 2015-10-29 DIAGNOSIS — F17200 Nicotine dependence, unspecified, uncomplicated: Secondary | ICD-10-CM | POA: Diagnosis present

## 2015-10-29 DIAGNOSIS — Z951 Presence of aortocoronary bypass graft: Secondary | ICD-10-CM

## 2015-10-29 DIAGNOSIS — E8770 Fluid overload, unspecified: Secondary | ICD-10-CM | POA: Diagnosis not present

## 2015-10-29 DIAGNOSIS — I25110 Atherosclerotic heart disease of native coronary artery with unstable angina pectoris: Secondary | ICD-10-CM | POA: Diagnosis not present

## 2015-10-29 HISTORY — PX: CARDIAC CATHETERIZATION: SHX172

## 2015-10-29 LAB — CBC
HCT: 40.9 % (ref 36.0–46.0)
HEMATOCRIT: 37.2 % (ref 36.0–46.0)
HEMOGLOBIN: 13.6 g/dL (ref 12.0–15.0)
Hemoglobin: 12.2 g/dL (ref 12.0–15.0)
MCH: 30.4 pg (ref 26.0–34.0)
MCH: 29.5 pg (ref 26.0–34.0)
MCHC: 33.3 g/dL (ref 30.0–36.0)
MCHC: 32.8 g/dL (ref 30.0–36.0)
MCV: 91.3 fL (ref 78.0–100.0)
MCV: 90.1 fL (ref 78.0–100.0)
PLATELETS: 317 10*3/uL (ref 150–400)
PLATELETS: 267 10*3/uL (ref 150–400)
RBC: 4.48 MIL/uL (ref 3.87–5.11)
RBC: 4.13 MIL/uL (ref 3.87–5.11)
RDW: 13.1 % (ref 11.5–15.5)
RDW: 13.1 % (ref 11.5–15.5)
WBC: 10.3 10*3/uL (ref 4.0–10.5)
WBC: 11.3 10*3/uL — ABNORMAL HIGH (ref 4.0–10.5)

## 2015-10-29 LAB — URINALYSIS, ROUTINE W REFLEX MICROSCOPIC
Bilirubin Urine: NEGATIVE
Glucose, UA: NEGATIVE mg/dL
Ketones, ur: NEGATIVE mg/dL
NITRITE: NEGATIVE
PROTEIN: NEGATIVE mg/dL
SPECIFIC GRAVITY, URINE: 1.009 (ref 1.005–1.030)
pH: 7 (ref 5.0–8.0)

## 2015-10-29 LAB — HEPATIC FUNCTION PANEL
ALBUMIN: 3.9 g/dL (ref 3.5–5.0)
ALK PHOS: 74 U/L (ref 38–126)
ALT: 23 U/L (ref 14–54)
AST: 21 U/L (ref 15–41)
Bilirubin, Direct: <0.1 mg/dL — ABNORMAL LOW (ref 0.1–0.5)
TOTAL PROTEIN: 7.7 g/dL (ref 6.5–8.1)
Total Bilirubin: 0.4 mg/dL (ref 0.3–1.2)

## 2015-10-29 LAB — BASIC METABOLIC PANEL
ANION GAP: 9 (ref 5–15)
BUN: 7 mg/dL (ref 6–20)
CHLORIDE: 105 mmol/L (ref 101–111)
CO2: 26 mmol/L (ref 22–32)
Calcium: 9.2 mg/dL (ref 8.9–10.3)
Creatinine, Ser: 0.93 mg/dL (ref 0.44–1.00)
GFR calc non Af Amer: >60 mL/min (ref >60)
GLUCOSE: 123 mg/dL — AB (ref 65–99)
POTASSIUM: 3.9 mmol/L (ref 3.5–5.1)
Sodium: 140 mmol/L (ref 135–145)

## 2015-10-29 LAB — BRAIN NATRIURETIC PEPTIDE: B NATRIURETIC PEPTIDE 5: 11.4 pg/mL (ref 0.0–100.0)

## 2015-10-29 LAB — CREATININE, SERUM
Creatinine, Ser: 1.01 mg/dL — ABNORMAL HIGH (ref 0.44–1.00)
GFR calc Af Amer: >60 mL/min (ref >60)
GFR calc non Af Amer: >60 mL/min (ref >60)

## 2015-10-29 LAB — I-STAT BETA HCG BLOOD, ED (MC, WL, AP ONLY)

## 2015-10-29 LAB — URINE MICROSCOPIC-ADD ON

## 2015-10-29 LAB — PROTIME-INR
INR: 1.11 (ref 0.00–1.49)
Prothrombin Time: 14.5 seconds (ref 11.6–15.2)

## 2015-10-29 LAB — POCT ACTIVATED CLOTTING TIME: Activated Clotting Time: 188 seconds

## 2015-10-29 LAB — TSH: TSH: 0.675 u[IU]/mL (ref 0.350–4.500)

## 2015-10-29 LAB — I-STAT TROPONIN, ED: Troponin i, poc: 0 ng/mL (ref 0.00–0.08)

## 2015-10-29 SURGERY — LEFT HEART CATH AND CORONARY ANGIOGRAPHY

## 2015-10-29 MED ORDER — ACETAMINOPHEN 325 MG PO TABS
650.0000 mg | ORAL_TABLET | ORAL | Status: DC | PRN
  Administered 2015-10-29 – 2015-10-31 (×3): 650 mg via ORAL
  Filled 2015-10-29 (×2): qty 2

## 2015-10-29 MED ORDER — HEPARIN SODIUM (PORCINE) 5000 UNIT/ML IJ SOLN
5000.0000 [IU] | Freq: Three times a day (TID) | INTRAMUSCULAR | Status: DC
Start: 2015-10-29 — End: 2015-11-02
  Administered 2015-10-29 – 2015-11-01 (×11): 5000 [IU] via SUBCUTANEOUS
  Filled 2015-10-29 (×10): qty 1

## 2015-10-29 MED ORDER — NITROGLYCERIN 1 MG/10 ML FOR IR/CATH LAB
INTRA_ARTERIAL | Status: AC
  Filled 2015-10-29: qty 10

## 2015-10-29 MED ORDER — SODIUM CHLORIDE 0.9 % IJ SOLN
3.0000 mL | Freq: Two times a day (BID) | INTRAMUSCULAR | Status: DC
  Administered 2015-10-29 – 2015-10-30 (×3): 3 mL via INTRAVENOUS
  Administered 2015-10-31: 10 mL via INTRAVENOUS
  Administered 2015-10-31 – 2015-11-01 (×2): 3 mL via INTRAVENOUS
  Administered 2015-11-01: 10 mL via INTRAVENOUS

## 2015-10-29 MED ORDER — NITROGLYCERIN 0.4 MG SL SUBL: 0.4000 mg | SUBLINGUAL_TABLET | SUBLINGUAL | Status: DC | PRN

## 2015-10-29 MED ORDER — PANTOPRAZOLE SODIUM 40 MG PO TBEC
40.0000 mg | DELAYED_RELEASE_TABLET | Freq: Every day | ORAL | Status: DC
  Administered 2015-10-29 – 2015-11-01 (×4): 40 mg via ORAL
  Filled 2015-10-29 (×4): qty 1

## 2015-10-29 MED ORDER — ASPIRIN EC 81 MG PO TBEC: 81.0000 mg | DELAYED_RELEASE_TABLET | Freq: Every day | ORAL | Status: DC

## 2015-10-29 MED ORDER — FAMOTIDINE IN NACL 20-0.9 MG/50ML-% IV SOLN
INTRAVENOUS | Status: AC
  Filled 2015-10-29: qty 50

## 2015-10-29 MED ORDER — HEPARIN (PORCINE) IN NACL 2-0.9 UNIT/ML-% IJ SOLN
INTRAMUSCULAR | Status: AC
  Filled 2015-10-29: qty 1000

## 2015-10-29 MED ORDER — ONDANSETRON HCL 4 MG/2ML IJ SOLN
INTRAMUSCULAR | Status: DC | PRN
  Administered 2015-10-29: 4 mg via INTRAVENOUS

## 2015-10-29 MED ORDER — SODIUM CHLORIDE 0.9 % IV SOLN: 250.0000 mL | INTRAVENOUS | Status: DC | PRN

## 2015-10-29 MED ORDER — NAPROXEN 500 MG PO TABS
500.0000 mg | ORAL_TABLET | Freq: Two times a day (BID) | ORAL | Status: DC
  Administered 2015-10-29 – 2015-11-01 (×7): 500 mg via ORAL
  Filled 2015-10-29 (×13): qty 1

## 2015-10-29 MED ORDER — METHYLPREDNISOLONE SODIUM SUCC 125 MG IJ SOLR
INTRAMUSCULAR | Status: DC | PRN
  Administered 2015-10-29: 125 mg via INTRAVENOUS

## 2015-10-29 MED ORDER — IBUPROFEN 200 MG PO TABS
600.0000 mg | ORAL_TABLET | Freq: Once | ORAL | Status: DC
  Filled 2015-10-29: qty 3

## 2015-10-29 MED ORDER — METHYLPREDNISOLONE SODIUM SUCC 125 MG IJ SOLR
INTRAMUSCULAR | Status: AC
  Filled 2015-10-29: qty 2

## 2015-10-29 MED ORDER — DIPHENHYDRAMINE HCL 50 MG/ML IJ SOLN
INTRAMUSCULAR | Status: DC | PRN
  Administered 2015-10-29: 25 mg via INTRAVENOUS

## 2015-10-29 MED ORDER — SODIUM CHLORIDE 0.9 % IJ SOLN: 3.0000 mL | INTRAMUSCULAR | Status: DC | PRN

## 2015-10-29 MED ORDER — ATORVASTATIN CALCIUM 40 MG PO TABS: 40.0000 mg | ORAL_TABLET | Freq: Every day | ORAL | Status: DC

## 2015-10-29 MED ORDER — ACETAMINOPHEN 500 MG PO TABS
500.0000 mg | ORAL_TABLET | Freq: Once | ORAL | Status: AC
  Administered 2015-10-29: 500 mg via ORAL
  Filled 2015-10-29: qty 1

## 2015-10-29 MED ORDER — ONDANSETRON HCL 4 MG/2ML IJ SOLN: 4.0000 mg | Freq: Four times a day (QID) | INTRAMUSCULAR | Status: DC | PRN

## 2015-10-29 MED ORDER — FENTANYL CITRATE (PF) 100 MCG/2ML IJ SOLN
INTRAMUSCULAR | Status: DC | PRN
  Administered 2015-10-29 (×2): 25 ug via INTRAVENOUS

## 2015-10-29 MED ORDER — ACETAMINOPHEN 325 MG PO TABS
ORAL_TABLET | ORAL | Status: AC
  Filled 2015-10-29: qty 2

## 2015-10-29 MED ORDER — SODIUM CHLORIDE 0.9 % WEIGHT BASED INFUSION: 3.0000 mL/kg/h | INTRAVENOUS | Status: AC

## 2015-10-29 MED ORDER — ASPIRIN 300 MG RE SUPP
300.0000 mg | RECTAL | Status: DC
  Filled 2015-10-29: qty 1

## 2015-10-29 MED ORDER — NITROGLYCERIN IN D5W 200-5 MCG/ML-% IV SOLN
INTRAVENOUS | Status: AC
  Filled 2015-10-29: qty 250

## 2015-10-29 MED ORDER — ALBUTEROL SULFATE HFA 108 (90 BASE) MCG/ACT IN AERS: 2.0000 | INHALATION_SPRAY | Freq: Four times a day (QID) | RESPIRATORY_TRACT | Status: DC | PRN

## 2015-10-29 MED ORDER — NITROGLYCERIN IN D5W 200-5 MCG/ML-% IV SOLN
INTRAVENOUS | Status: DC | PRN
  Administered 2015-10-29: 10 ug/min via INTRAVENOUS

## 2015-10-29 MED ORDER — LORAZEPAM 1 MG PO TABS
1.0000 mg | ORAL_TABLET | Freq: Two times a day (BID) | ORAL | Status: DC | PRN
  Administered 2015-10-29 – 2015-11-05 (×9): 1 mg via ORAL
  Filled 2015-10-29 (×11): qty 1

## 2015-10-29 MED ORDER — DIPHENHYDRAMINE HCL 50 MG/ML IJ SOLN
INTRAMUSCULAR | Status: AC
  Filled 2015-10-29: qty 1

## 2015-10-29 MED ORDER — FLUOXETINE HCL 20 MG PO CAPS
20.0000 mg | ORAL_CAPSULE | Freq: Every day | ORAL | Status: DC
  Administered 2015-10-29 – 2015-11-06 (×8): 20 mg via ORAL
  Filled 2015-10-29 (×8): qty 1

## 2015-10-29 MED ORDER — MIDAZOLAM HCL 2 MG/2ML IJ SOLN
INTRAMUSCULAR | Status: AC
  Filled 2015-10-29: qty 2

## 2015-10-29 MED ORDER — ASPIRIN 81 MG PO CHEW: 324.0000 mg | CHEWABLE_TABLET | ORAL | Status: DC

## 2015-10-29 MED ORDER — LIDOCAINE HCL (PF) 1 % IJ SOLN
INTRAMUSCULAR | Status: AC
  Filled 2015-10-29: qty 30

## 2015-10-29 MED ORDER — MIDAZOLAM HCL 2 MG/2ML IJ SOLN
INTRAMUSCULAR | Status: DC | PRN
  Administered 2015-10-29 (×2): 1 mg via INTRAVENOUS

## 2015-10-29 MED ORDER — FENTANYL CITRATE (PF) 100 MCG/2ML IJ SOLN
INTRAMUSCULAR | Status: AC
  Filled 2015-10-29: qty 2

## 2015-10-29 MED ORDER — SODIUM CHLORIDE 0.9 % IJ SOLN: 3.0000 mL | Freq: Two times a day (BID) | INTRAMUSCULAR | Status: DC

## 2015-10-29 MED ORDER — ONDANSETRON HCL 4 MG/2ML IJ SOLN
4.0000 mg | Freq: Four times a day (QID) | INTRAMUSCULAR | Status: DC | PRN
  Administered 2015-11-01: 4 mg via INTRAVENOUS
  Filled 2015-10-29: qty 2

## 2015-10-29 MED ORDER — ATORVASTATIN CALCIUM 80 MG PO TABS
80.0000 mg | ORAL_TABLET | Freq: Every day | ORAL | Status: DC
  Administered 2015-10-29 – 2015-11-05 (×7): 80 mg via ORAL
  Filled 2015-10-29 (×7): qty 1

## 2015-10-29 MED ORDER — ACETAMINOPHEN 325 MG PO TABS: 650.0000 mg | ORAL_TABLET | ORAL | Status: DC | PRN

## 2015-10-29 MED ORDER — TRAMADOL HCL 50 MG PO TABS
50.0000 mg | ORAL_TABLET | Freq: Three times a day (TID) | ORAL | Status: DC | PRN
  Administered 2015-11-01: 50 mg via ORAL
  Filled 2015-10-29: qty 1

## 2015-10-29 MED ORDER — ASPIRIN 81 MG PO CHEW
81.0000 mg | CHEWABLE_TABLET | Freq: Every day | ORAL | Status: DC
  Administered 2015-10-30 – 2015-11-01 (×3): 81 mg via ORAL
  Filled 2015-10-29 (×3): qty 1

## 2015-10-29 MED ORDER — MORPHINE SULFATE (PF) 2 MG/ML IV SOLN: 2.0000 mg | INTRAVENOUS | Status: DC | PRN

## 2015-10-29 MED ORDER — VERAPAMIL HCL 2.5 MG/ML IV SOLN
INTRA_ARTERIAL | Status: DC | PRN
  Administered 2015-10-29: 15:00:00 via INTRA_ARTERIAL

## 2015-10-29 MED ORDER — ATORVASTATIN CALCIUM 80 MG PO TABS: 80.0000 mg | ORAL_TABLET | Freq: Every day | ORAL | Status: DC

## 2015-10-29 MED ORDER — FAMOTIDINE IN NACL 20-0.9 MG/50ML-% IV SOLN
INTRAVENOUS | Status: DC | PRN
  Administered 2015-10-29: 20 mg via INTRAVENOUS

## 2015-10-29 MED ORDER — HYDRALAZINE HCL 20 MG/ML IJ SOLN
10.0000 mg | INTRAMUSCULAR | Status: DC | PRN
  Administered 2015-11-01 (×2): 10 mg via INTRAVENOUS
  Filled 2015-10-29 (×2): qty 1

## 2015-10-29 MED ORDER — ALBUTEROL SULFATE (2.5 MG/3ML) 0.083% IN NEBU: 2.5000 mg | INHALATION_SOLUTION | Freq: Four times a day (QID) | RESPIRATORY_TRACT | Status: DC | PRN

## 2015-10-29 SURGICAL SUPPLY — 21 items
CATH INFINITI 4FR JL3.5 (CATHETERS) ×3 IMPLANT
CATH INFINITI 5FR ANG PIGTAIL (CATHETERS) ×3 IMPLANT
CATH INFINITI 5FR JL4 (CATHETERS) ×3 IMPLANT
CATH INFINITI 6F FL3.5 (CATHETERS) ×3 IMPLANT
CATH INFINITI JR4 5F (CATHETERS) ×3 IMPLANT
CATH LAUNCHER 5F EBU3.0 (CATHETERS) ×1 IMPLANT
CATH LAUNCHER 5F EBU3.5 (CATHETERS) ×3 IMPLANT
CATH LAUNCHER 5F JL3.5 (CATHETERS) ×1 IMPLANT
CATH OPTITORQUE TIG 4.0 5F (CATHETERS) ×3 IMPLANT
CATHETER LAUNCHER 5F EBU3.0 (CATHETERS) ×3
CATHETER LAUNCHER 5F JL3.5 (CATHETERS) ×3
DEVICE RAD COMP TR BAND LRG (VASCULAR PRODUCTS) ×3 IMPLANT
GLIDESHEATH SLEND A-KIT 6F 22G (SHEATH) ×3 IMPLANT
KIT HEART LEFT (KITS) ×3 IMPLANT
PACK CARDIAC CATHETERIZATION (CUSTOM PROCEDURE TRAY) ×3 IMPLANT
SHEATH PINNACLE 5F 10CM (SHEATH) ×3 IMPLANT
SYR MEDRAD MARK V 150ML (SYRINGE) ×3 IMPLANT
TRANSDUCER W/STOPCOCK (MISCELLANEOUS) ×3 IMPLANT
TUBING CIL FLEX 10 FLL-RA (TUBING) ×3 IMPLANT
WIRE EMERALD 3MM-J .035X150CM (WIRE) ×3 IMPLANT
WIRE HI TORQ VERSACORE-J 145CM (WIRE) ×3 IMPLANT

## 2015-10-29 NOTE — H&P (Signed)
.      Patient ID: Brenda Cruz MRN: [id number], DOB/AGE: 48-01-1966   Admit date: 10/29/2015   Primary Physician: Eustaquio Maize, MD Primary Cardiologist: New- seen in consult by Dr Gwenlyn Found  Pt. Profile:     Brenda Cruz is a 50 y.o. female with a history of GERD, newly diagnosed HLD/ HTN, anxiety, morbid obesity, continued tobacco abuse ( 1.5 PPD) and no past cardiac history who presented to [facility] ED today (10/29/15) with chest pain/SOB.   She has been having substernal chest pain since November 2016 but worsening for the past 3 weeks. The chest pain is worse with minimal exertion and associated with SOB/nausea and relived at rest. Her chest pain is not reminiscent of her typical GERD symptoms. She was seen on 10/16/15 by her PCP for evaluation of chest pain. She was supposed to get a stress test, but this was never scheduled. She was newly started on a statin and lisinopril for elevated BPs in the office. However, she never started any of these medications because she "doesnt like taking pills." Additionally, her BP had normalized at home and she was afraid to take the medication for fear of hypotension. She routinely takes her BP at home and it seems to be quite labile.   She works at [employer] in [place] and has quite a bit of stress at work. This morning she became so SOB and had such severe chest pain that she felt like she might pass out. No syncope. She saw the PA at [facility] who advised that she present to the ED. She denies palpitations, LE edema, orthopnea or PND.  She does admit to snoring at night but no witnessed apneic episodes. She does not formally exercise and is very sedentary. At the end of our discussion, her husband and she mentioned that this all started after starting her new position at [employer]. She does not have these symptoms on the weekends or at home. For instance, she walked up an incline in the snow last week and had minimal SOB with no chest pain.  Additionally, she has a long history of anxiety and recently started taking her prozac again as well as her ativan.   Of note, she does have a family hx of CAD. Her father died of a massive heart attack at 45. Her mother has 6 siblings and every one of them had CAD with events in their 17s. Her sister has difficult to control HTN and had a heart cath in her 47s, but no stenting to their knowledge.   In the ED her troponin was negative, ECG with no acute changes and she is totally chest pain free.   Problem List  Past Medical History  Diagnosis Date  . Asthma   . GERD (gastroesophageal reflux disease)   . DDD (degenerative disc disease)   . Hyperlipemia   . Anxiety   . Hemorrhoids   . TMJ (temporomandibular joint disorder)     Past Surgical History  Procedure Laterality Date  . Cholecystectomy    . Lumbar fusion       Allergies  Allergies  Allergen Reactions  . Iohexol      Code: RASH, Desc: White blisters in mouth during ivp in [place] '93, ok w/ 13 hour prep today//a.calhoun, Onset Date: [id number]   . Penicillins Other (See Comments)    Has patient had a PCN reaction causing immediate rash, facial/tongue/throat swelling, SOB or lightheadedness with hypotension: No Has patient had a PCN  reaction causing severe rash involving mucus membranes or skin necrosis: No Has patient had a PCN reaction that required hospitalization No Has patient had a PCN reaction occurring within the last 10 years: No If all of the above answers are "NO", then may proceed with Cephalosporin use.  . Sulfa Antibiotics   . Zithromax [Azithromycin]      Home Medications  Prior to Admission medications   Medication Sig Start Date End Date Taking? Authorizing Provider  albuterol (PROVENTIL HFA;VENTOLIN HFA) 108 (90 BASE) MCG/ACT inhaler Inhale 2 puffs into the lungs every 6 (six) hours as needed for wheezing or shortness of breath. 07/02/14  Yes Sharion Balloon, [name]  atorvastatin (LIPITOR) 40 MG  tablet Take 1 tablet (40 mg total) by mouth daily. 10/20/15  Yes [name] [name], [name]  esomeprazole (NEXIUM) 40 MG capsule TAKE ONE (1) CAPSULE EACH DAY 10/16/15  Yes [name] [name], [name]  FLUoxetine (PROZAC) 20 MG capsule TAKE ONE (1) CAPSULE EACH DAY 10/16/15  Yes [name] [name], [name]  LORazepam (ATIVAN) 1 MG tablet TAKE 1 TABLET TWICE DAILY AS NEEDED FOR ANXIETY 10/16/15  Yes [name] [name], [name]  traMADol (ULTRAM) 50 MG tablet TAKE 1 TABLET 3 TIMES DAILY AS NEEDED FOR PAIN 03/02/15  Yes [name] [name], [name]  ALBUTEROL IN Inhale into the lungs.    Historical Provider, MD  lisinopril (PRINIVIL,ZESTRIL) 20 MG tablet Take 1 tablet (20 mg total) by mouth daily. 10/16/15   [name] [name], [name]  Mometasone Furoate (NASONEX NA) Place into the nose as needed.      Historical Provider, MD  naproxen (NAPROSYN) 500 MG tablet Take 1 tablet (500 mg total) by mouth 2 (two) times daily with a meal. Patient not taking: Reported on 10/29/2015 11/20/14   Lysbeth Penner, [name]    Family History  Family History  Problem Relation Age of Onset  . Colon cancer Neg Hx   . Colon polyps Father   . Stomach cancer Paternal Grandmother   . Esophageal cancer Maternal Grandfather   . Breast cancer Mother   . Ovarian cancer      Maternal Side Great  Aunt   No family status information on file.     Social History  Social History   Social History  . Marital Status: Married    Spouse Name: N/A  . Number of Children: 1  . Years of Education: N/A   Occupational History  . Medical Billing    Social History Main Topics  . Smoking status: Current Every Day Smoker  . Smokeless tobacco: Never Used     Comment: Counseling paper for smoking given to patient in exam room   . Alcohol Use: No  . Drug Use: No  . Sexual Activity: Not on file   Other Topics Concern  . Not on file   Social History Narrative   2 caffeine drinks daily      All other systems reviewed and are otherwise  negative except as noted above.  Physical Exam  Blood pressure 173/75, pulse 91, temperature 98.7 F (37.1 C), temperature source Oral, resp. rate 20, weight 221 lb 14.4 oz (100.653 kg), last menstrual period 10/10/2015, SpO2 100 %.  General: Pleasant, NAD. Obese  Psych: Normal affect. Neuro: Alert and oriented X 3. Moves all extremities spontaneously. HEENT: Normal  Neck: Supple without bruits or JVD. Lungs:  Resp regular and unlabored, CTA. Heart: RRR no s3, s4, or murmurs. Abdomen: Soft, non-tender, non-distended, BS + x 4.  Extremities: No clubbing, cyanosis or edema.  DP/PT/Radials 2+ and equal bilaterally.  Labs  No results for input(s): CKTOTAL, CKMB, TROPONINI in the last 72 hours. Lab Results  Component Value Date   WBC 10.3 10/29/2015   HGB 13.6 10/29/2015   HCT 40.9 10/29/2015   MCV 91.3 10/29/2015   PLT 317 10/29/2015    Recent Labs Lab 10/29/15 0952  NA 140  K 3.9  CL 105  CO2 26  BUN 7  CREATININE 0.93  CALCIUM 9.2  PROT 7.7  BILITOT 0.4  ALKPHOS 74  ALT 23  AST 21  GLUCOSE 123*   Lab Results  Component Value Date   CHOL 251* 10/16/2015   HDL 24* 10/16/2015   LDLCALC 163* 10/16/2015   TRIG 318* 10/16/2015   No results found for: DDIMER   Radiology/Studies  Dg Chest 2 View  10/29/2015  CLINICAL DATA:  Mid chest pressure off and on for 3 weeks with exertion, hypertension, asthma, smoker EXAM: CHEST  2 VIEW COMPARISON:  03/20/2014 ; correlation CT chest 10/13/2011 FINDINGS: Normal heart size, mediastinal contours, and pulmonary vascularity. Minimal central peribronchial thickening. Focus of scarring in lingula adjacent to LEFT heart border similar to prior CT. No acute infiltrate, pleural effusion or pneumothorax. Bones unremarkable. IMPRESSION: Bronchitic changes with lingular scarring. No acute abnormalities. Electronically Signed   By: Lavonia Dana M.D.   On: 10/29/2015 10:27    ECG  NSR HR 91  ASSESSMENT AND PLAN  CHYNIA BRAZELTON is a 50  y.o. female with a history of GERD, newly diagnosed HLD/ HTN, anxiety, morbid obesity, continued tobacco abuse (1.5 PPD) and no past cardiac history who presented to [facility] ED today (10/29/15) with chest pain/SOB.   Chest pain: she does have RF for CAD including obesity, family hx, HLD, HTN and long term tobacco abuse.  -- Troponin neg x1, ECG with no acute ST or TW changes.  -- She has symptoms worrisome for unstable angina and we will proceed with LHC today. If negative she can go home today  HLD: continue Rosuvastatin as recently started by her PCP  HTN: BP quite labile. BP currently normal. If she has CAD we will need to add a BB.    Hyperglycemia: will get a HGa1c   Tobacco abuse: she needs to quit smoking  Signed, Crista Luria 10/29/2015, 12:32 PM  Pager [phone]  Agree with note by Nell Range PA-C  Pt with + CRF, accelerated angina. Exam benign. Labs OK. Plan cor angio radially today.  I have reviewed the risks, indications, and alternatives to cardiac catheterization, possible angioplasty, and stenting with the patient. Risks include but are not limited to bleeding, infection, vascular injury, stroke, myocardial infection, arrhythmia, kidney injury, radiation-related injury in the case of prolonged fluoroscopy use, emergency cardiac surgery, and death. The patient understands the risks of serious complication is 1-2 in [id number] with diagnostic cardiac cath and 1-2% or less with angioplasty/stenting.    [name], M.D., [place], [facility], [place]verta [place] [address] [place], [place]  [id number]  [phone] 10/29/2015 2:09 PM

## 2015-10-29 NOTE — ED Provider Notes (Signed)
CSN: [id number]     Arrival date & time 10/29/15  [id number] History   First MD Initiated Contact with Patient 10/29/15 1110     Chief Complaint  Patient presents with  . Chest Pain   50 year old Caucasian female with past medical history of asthma, DDD, HTN, HLD, Chronic smoker (2 packs per day) who presents today for chest pain and shortness of breath. Patient endorses she's had exertional chest pain which is a pressure sensation in the substernal area that does not radiate. This is been present since mid November. Worsening. Accompanied by nausea and extreme shortness of breath. Unable to walk long distances without chest pain starting. Pain gets up to a 10 out of 10 and dissipates with rest. Saw her PCP last month he was supposed to set up a stress test but somehow this got lost in the shuffle and no one ever called her.   She denies fever, chills, vomiting, diarrhea, constipation, hematemesis, dysuria, hematuria, sick contacts, or recent travel.   (Consider location/radiation/quality/duration/timing/severity/associated sxs/prior Treatment) Patient is a 50 y.o. female presenting with chest pain. The history is provided by the patient.  Chest Pain Pain location:  Substernal area Pain quality: pressure   Pain radiates to:  Does not radiate Pain radiates to the back: no   Pain severity:  Moderate Onset quality:  Sudden Timing:  Constant Progression:  Worsening Chronicity:  New Context comment:  Exertion Associated symptoms: nausea and shortness of breath   Associated symptoms: no abdominal pain, no dizziness, no fever, no headache, no near-syncope, no palpitations, not vomiting and no weakness     Past Medical History  Diagnosis Date  . Asthma   . GERD (gastroesophageal reflux disease)   . DDD (degenerative disc disease)   . Hyperlipemia   . Anxiety   . Hemorrhoids   . TMJ (temporomandibular joint disorder)    Past Surgical History  Procedure Laterality Date  . Cholecystectomy     . Lumbar fusion     Family History  Problem Relation Age of Onset  . Colon cancer Neg Hx   . Colon polyps Father   . Stomach cancer Paternal Grandmother   . Esophageal cancer Maternal Grandfather   . Breast cancer Mother   . Ovarian cancer      Maternal Side Great  Aunt   Social History  Substance Use Topics  . Smoking status: Current Every Day Smoker  . Smokeless tobacco: Never Used     Comment: Counseling paper for smoking given to patient in exam room   . Alcohol Use: No   OB History    No data available     Review of Systems  Constitutional: Negative for fever and chills.  Respiratory: Positive for shortness of breath. Negative for wheezing and stridor.   Cardiovascular: Positive for chest pain. Negative for palpitations, leg swelling and near-syncope.  Gastrointestinal: Positive for nausea. Negative for vomiting, abdominal pain, diarrhea, constipation and abdominal distention.  Genitourinary: Negative for dysuria, frequency, flank pain and decreased urine volume.  Neurological: Negative for dizziness, speech difficulty, weakness, light-headedness and headaches.  All other systems reviewed and are negative.     Allergies  Iohexol; Penicillins; Sulfa antibiotics; and Zithromax  Home Medications   Prior to Admission medications   Medication Sig Start Date End Date Taking? Authorizing Provider  albuterol (PROVENTIL HFA;VENTOLIN HFA) 108 (90 BASE) MCG/ACT inhaler Inhale 2 puffs into the lungs every 6 (six) hours as needed for wheezing or shortness of breath. 07/02/14  Yes Christy  A Hawks, [name]  atorvastatin (LIPITOR) 40 MG tablet Take 1 tablet (40 mg total) by mouth daily. 10/20/15  Yes [name] [name], [name]  esomeprazole (NEXIUM) 40 MG capsule TAKE ONE (1) CAPSULE EACH DAY 10/16/15  Yes [name] [name], [name]  FLUoxetine (PROZAC) 20 MG capsule TAKE ONE (1) CAPSULE EACH DAY 10/16/15  Yes [name] [name], [name]  LORazepam (ATIVAN) 1 MG tablet TAKE 1 TABLET TWICE  DAILY AS NEEDED FOR ANXIETY 10/16/15  Yes [name] [name], [name]  traMADol (ULTRAM) 50 MG tablet TAKE 1 TABLET 3 TIMES DAILY AS NEEDED FOR PAIN 03/02/15  Yes [name] [name], [name]  ALBUTEROL IN Inhale into the lungs.    Historical Provider, MD  lisinopril (PRINIVIL,ZESTRIL) 20 MG tablet Take 1 tablet (20 mg total) by mouth daily. 10/16/15   [name] [name], [name]  Mometasone Furoate (NASONEX NA) Place into the nose as needed.      Historical Provider, MD  naproxen (NAPROSYN) 500 MG tablet Take 1 tablet (500 mg total) by mouth 2 (two) times daily with a meal. Patient not taking: Reported on 10/29/2015 11/20/14   Orson Ape Oxford, [name]   BP 155/74 mmHg  Pulse 74  Temp(Src) 98.7 F (37.1 C) (Oral)  Resp 18  Wt 100.653 kg  SpO2 99%  LMP 10/10/2015 Physical Exam  Constitutional: She is oriented to person, place, and time. She appears well-developed and well-nourished. No distress.  HENT:  Head: Normocephalic and atraumatic.  Cardiovascular: Normal rate, regular rhythm, normal heart sounds and intact distal pulses.  Exam reveals no gallop and no friction rub.   No murmur heard. Pulmonary/Chest: Effort normal and breath sounds normal. No respiratory distress. She has no wheezes. She has no rales. She exhibits no tenderness.  Abdominal: Soft. Bowel sounds are normal. She exhibits no distension and no mass. There is no tenderness. There is no rebound and no guarding.  Lymphadenopathy:    She has no cervical adenopathy.  Neurological: She is alert and oriented to person, place, and time. No cranial nerve deficit. Coordination normal.  Skin: Skin is warm and dry. No rash noted. She is not diaphoretic.  Nursing note and vitals reviewed.   ED Course  Procedures (including critical care time) Labs Review Labs Reviewed  BASIC METABOLIC PANEL - Abnormal; Notable for the following:    Glucose, Bld 123 (*)    All other components within normal limits  HEPATIC FUNCTION PANEL - Abnormal;  Notable for the following:    Bilirubin, Direct <0.1 (*)    All other components within normal limits  URINALYSIS, ROUTINE W REFLEX MICROSCOPIC (NOT AT [facility]) - Abnormal; Notable for the following:    APPearance CLOUDY (*)    Hgb urine dipstick LARGE (*)    Leukocytes, UA TRACE (*)    All other components within normal limits  URINE MICROSCOPIC-ADD ON - Abnormal; Notable for the following:    Squamous Epithelial / LPF 0-5 (*)    Bacteria, UA FEW (*)    Casts HYALINE CASTS (*)    All other components within normal limits  CBC  BRAIN NATRIURETIC PEPTIDE  HEMOGLOBIN A1C  I-STAT TROPOININ, ED  I-STAT BETA HCG BLOOD, ED ([facility], [facility], [facility] ONLY)  I-STAT TROPOININ, ED    Imaging Review Dg Chest 2 View  10/29/2015  CLINICAL DATA:  Mid chest pressure off and on for 3 weeks with exertion, hypertension, asthma, smoker EXAM: CHEST  2 VIEW COMPARISON:  03/20/2014 ; correlation CT chest 10/13/2011 FINDINGS: Normal heart size, mediastinal contours, and pulmonary vascularity. Minimal  central peribronchial thickening. Focus of scarring in lingula adjacent to LEFT heart border similar to prior CT. No acute infiltrate, pleural effusion or pneumothorax. Bones unremarkable. IMPRESSION: Bronchitic changes with lingular scarring. No acute abnormalities. Electronically Signed   By: Lavonia Dana M.D.   On: 10/29/2015 10:27   I have personally reviewed and evaluated these images and lab results as part of my medical decision-making.   EKG Interpretation   Date/Time:  Thursday October 29 2015 09:44:06 EST Ventricular Rate:  91 PR Interval:  128 QRS Duration: 82 QT Interval:  354 QTC Calculation: 435 R Axis:   2 Text Interpretation:  Normal sinus rhythm Normal ECG No old tracing to  compare Confirmed by FLOYD MD, DANIEL [phone]) on 10/29/2015 10:39:10 AM      MDM   Final diagnoses:  Chest pain, exertional   50 year old Caucasian female with extensive cardiac risk factors who presents today with chest pain  and shortness of breath. See history of present illness for details. On exam patient in NAD, afebrile, vital stable. Resting EKG shows no sign of ischemia, NSR. Labs are reassuring. No history and exam not consistent with DVT/PE.   BNP wnl: not c/w CHF.  Likely unstable angina. Will discuss with cardiology as she has high risk for CAD/ACS/cardiac event.   Pt now complaining of frontal throbbing HA. LIkely 2/2 caffeine withdrawal as she is a heavy soda drinker and hasn't had any today. Given tylenol, no help. Given motrin.  Cards to admit directly for cath.   Pt was seen under the supervision of Dr. Maryan Rued.     Sherian Maroon, MD 10/29/15 1416  Sherian Maroon, MD 10/29/15 [place], MD 10/29/15 2100

## 2015-10-29 NOTE — Interval H&P Note (Signed)
Cath Lab Visit (complete for each Cath Lab visit)  Clinical Evaluation Leading to the Procedure:   ACS: Yes.    Non-ACS:    Anginal Classification: CCS IV  Anti-ischemic medical therapy: No Therapy  Non-Invasive Test Results: No non-invasive testing performed  Prior CABG: No previous CABG      History and Physical Interval Note:  10/29/2015 3:35 PM  Brenda Cruz  has presented today for surgery, with the diagnosis of urgent  The various methods of treatment have been discussed with the patient and family. After consideration of risks, benefits and other options for treatment, the patient has consented to  Procedure(s): Left Heart Cath and Coronary Angiography (N/A) as a surgical intervention .  The patient's history has been reviewed, patient examined, no change in status, stable for surgery.  I have reviewed the patient's chart and labs.  Questions were answered to the patient's satisfaction.     Quay Burow

## 2015-10-29 NOTE — ED Notes (Addendum)
Pt reports chest pain that is worse with movement. Pt reports SOB. Pt states that pain is central chest that radiates across the chest. Pt states that this has been ongoing for 3 weeks and has been seen by PCP. Pt was to be scheduled for a stress test.

## 2015-10-30 ENCOUNTER — Inpatient Hospital Stay (HOSPITAL_COMMUNITY): Payer: Managed Care, Other (non HMO)

## 2015-10-30 ENCOUNTER — Encounter (HOSPITAL_COMMUNITY): Payer: Self-pay | Admitting: Cardiovascular Disease

## 2015-10-30 DIAGNOSIS — R079 Chest pain, unspecified: Secondary | ICD-10-CM

## 2015-10-30 DIAGNOSIS — I25110 Atherosclerotic heart disease of native coronary artery with unstable angina pectoris: Secondary | ICD-10-CM

## 2015-10-30 DIAGNOSIS — I10 Essential (primary) hypertension: Secondary | ICD-10-CM

## 2015-10-30 LAB — PULMONARY FUNCTION TEST
DL/VA % pred: 76 %
DL/VA: 3.87 ml/min/mmHg/L
DLCO unc % pred: 69 %
DLCO unc: 18.78 ml/min/mmHg
FEF 25-75 Post: 3.38 L/s
FEF 25-75 Pre: 3.11 L/s
FEF2575-%Change-Post: 8 %
FEF2575-%Pred-Post: 113 %
FEF2575-%Pred-Pre: 104 %
FEV1-%Change-Post: 2 %
FEV1-%Pred-Post: 98 %
FEV1-%Pred-Pre: 96 %
FEV1-Post: 3 L
FEV1-Pre: 2.94 L
FEV1FVC-%Change-Post: 0 %
FEV1FVC-%Pred-Pre: 100 %
FEV6-%Change-Post: 1 %
FEV6-%Pred-Post: 97 %
FEV6-%Pred-Pre: 96 %
FEV6-Post: 3.65 L
FEV6-Pre: 3.6 L
FEV6FVC-%Change-Post: 0 %
FEV6FVC-%Pred-Post: 102 %
FEV6FVC-%Pred-Pre: 101 %
FVC-%Change-Post: 1 %
FVC-%Pred-Post: 95 %
FVC-%Pred-Pre: 94 %
FVC-Post: 3.65 L
FVC-Pre: 3.61 L
Post FEV1/FVC ratio: 82 %
Post FEV6/FVC ratio: 100 %
Pre FEV1/FVC ratio: 81 %
Pre FEV6/FVC Ratio: 100 %
RV % pred: 131 %
RV: 2.41 L
TLC % pred: 113 %
TLC: 6.06 L

## 2015-10-30 LAB — LIPID PANEL
CHOL/HDL RATIO: 8.1 ratio
CHOLESTEROL: 252 mg/dL — AB (ref 0–200)
HDL: 31 mg/dL — ABNORMAL LOW (ref >40)
LDL CALC: 192 mg/dL — AB (ref 0–99)
TRIGLYCERIDES: 145 mg/dL (ref <150)
VLDL: 29 mg/dL (ref 0–40)

## 2015-10-30 LAB — COMPREHENSIVE METABOLIC PANEL
ALT: 20 U/L (ref 14–54)
AST: 22 U/L (ref 15–41)
Albumin: 3.3 g/dL — ABNORMAL LOW (ref 3.5–5.0)
Alkaline Phosphatase: 64 U/L (ref 38–126)
Anion gap: 9 (ref 5–15)
BUN: 9 mg/dL (ref 6–20)
CHLORIDE: 106 mmol/L (ref 101–111)
CO2: 22 mmol/L (ref 22–32)
CREATININE: 0.97 mg/dL (ref 0.44–1.00)
Calcium: 8.8 mg/dL — ABNORMAL LOW (ref 8.9–10.3)
GFR calc Af Amer: >60 mL/min (ref >60)
Glucose, Bld: 248 mg/dL — ABNORMAL HIGH (ref 65–99)
POTASSIUM: 3.9 mmol/L (ref 3.5–5.1)
SODIUM: 137 mmol/L (ref 135–145)
Total Bilirubin: 0.3 mg/dL (ref 0.3–1.2)
Total Protein: 6.1 g/dL — ABNORMAL LOW (ref 6.5–8.1)

## 2015-10-30 LAB — CBC
HCT: 37.7 % (ref 36.0–46.0)
HEMOGLOBIN: 12.3 g/dL (ref 12.0–15.0)
MCH: 29.7 pg (ref 26.0–34.0)
MCHC: 32.6 g/dL (ref 30.0–36.0)
MCV: 91.1 fL (ref 78.0–100.0)
PLATELETS: 268 10*3/uL (ref 150–400)
RBC: 4.14 MIL/uL (ref 3.87–5.11)
RDW: 13.2 % (ref 11.5–15.5)
WBC: 12.3 10*3/uL — ABNORMAL HIGH (ref 4.0–10.5)

## 2015-10-30 LAB — HEMOGLOBIN A1C
Hgb A1c MFr Bld: 6.8 % — ABNORMAL HIGH (ref 4.8–5.6)
Mean Plasma Glucose: 148 mg/dL

## 2015-10-30 LAB — PROTIME-INR
INR: 1.06 (ref 0.00–1.49)
PROTHROMBIN TIME: 14 s (ref 11.6–15.2)

## 2015-10-30 MED ORDER — NITROGLYCERIN 2 % TD OINT
0.5000 [in_us] | TOPICAL_OINTMENT | Freq: Four times a day (QID) | TRANSDERMAL | Status: DC
  Administered 2015-10-30 – 2015-11-01 (×11): 0.5 [in_us] via TOPICAL
  Filled 2015-10-30 (×2): qty 30

## 2015-10-30 MED ORDER — METOPROLOL TARTRATE 12.5 MG HALF TABLET
12.5000 mg | ORAL_TABLET | Freq: Two times a day (BID) | ORAL | Status: DC
  Administered 2015-10-30 – 2015-11-01 (×6): 12.5 mg via ORAL
  Filled 2015-10-30 (×6): qty 1

## 2015-10-30 MED ORDER — ALBUTEROL SULFATE (2.5 MG/3ML) 0.083% IN NEBU: 2.5000 mg | INHALATION_SOLUTION | Freq: Once | RESPIRATORY_TRACT | Status: DC

## 2015-10-30 MED FILL — Heparin Sodium (Porcine) 2 Unit/ML in Sodium Chloride 0.9%: INTRAMUSCULAR | Qty: 500 | Status: AC

## 2015-10-30 NOTE — Progress Notes (Signed)
CARDIAC REHAB PHASE I   Cardiac surgery pre-op education completed with pt and mother at bedside. Reviewed IS, sternal precautions, activity progression, cardiac surgery booklet, and cardiac surgery guidelines.  Pt verbalized understanding, states she is very scared, emotional support given to pt. Pt does not have phone in room to play cardiac surgery videos, pt states her nurse mentioned playing them for her if she is interested. Pt in bed, call bell within reach. Will follow post-op.   [id number] Lenna Sciara, RN, BSN 10/30/2015 3:23 PM

## 2015-10-30 NOTE — Plan of Care (Signed)
Problem: Phase I - Pre-Op Goal: Point person for discharge identified Outcome: Completed/Met Date Met:  10/30/15 Discharge home with husband

## 2015-10-30 NOTE — Progress Notes (Signed)
Utilization Review Completed.  

## 2015-10-30 NOTE — Progress Notes (Signed)
Subjective:  S/P cath revealing 80% ostial LM and nl LV fxn yesterday  Objective:  Temp:  [98.3 F (36.8 C)-98.7 F (37.1 C)] 98.3 F (36.8 C) (01/13 0402) Pulse Rate:  [64-103] 74 (01/13 0700) Resp:  [9-32] 12 (01/13 0700) BP: (93-181)/(52-119) 145/78 mmHg (01/13 0700) SpO2:  [95 %-100 %] 99 % (01/13 0700) Weight:  [221 lb 14.4 oz (100.653 kg)] 221 lb 14.4 oz (100.653 kg) (01/12 2000) Weight change:   Intake/Output from previous day: 01/12 0701 - 01/13 0700 In: 705.2 [P.O.:240; I.V.:465.2] Out: 1500 [Urine:1500]  Intake/Output from this shift:    Physical Exam: General appearance: alert and no distress Neck: no adenopathy, no carotid bruit, no JVD, supple, symmetrical, trachea midline and thyroid not enlarged, symmetric, no tenderness/mass/nodules Lungs: clear to auscultation bilaterally Heart: regular rate and rhythm, S1, S2 normal, no murmur, click, rub or gallop Extremities: extremities normal, atraumatic, no cyanosis or edema and Right groin OK  Lab Results: Results for orders placed or performed during the hospital encounter of 10/29/15 (from the past 48 hour(s))  Basic metabolic panel     Status: Abnormal   Collection Time: 10/29/15  9:52 AM  Result Value Ref Range   Sodium 140 135 - 145 mmol/L   Potassium 3.9 3.5 - 5.1 mmol/L   Chloride 105 101 - 111 mmol/L   CO2 26 22 - 32 mmol/L   Glucose, Bld 123 (H) 65 - 99 mg/dL   BUN 7 6 - 20 mg/dL   Creatinine, Ser 0.93 0.44 - 1.00 mg/dL   Calcium 9.2 8.9 - 10.3 mg/dL   GFR calc non Af Amer >60 >60 mL/min   GFR calc Af Amer >60 >60 mL/min    Comment: (NOTE) The eGFR has been calculated using the CKD EPI equation. This calculation has not been validated in all clinical situations. eGFR's persistently <60 mL/min signify possible Chronic Kidney Disease.    Anion gap 9 5 - 15  CBC     Status: None   Collection Time: 10/29/15  9:52 AM  Result Value Ref Range   WBC 10.3 4.0 - 10.5 K/uL   RBC 4.48 3.87 - 5.11  MIL/uL   Hemoglobin 13.6 12.0 - 15.0 g/dL   HCT 40.9 36.0 - 46.0 %   MCV 91.3 78.0 - 100.0 fL   MCH 30.4 26.0 - 34.0 pg   MCHC 33.3 30.0 - 36.0 g/dL   RDW 13.1 11.5 - 15.5 %   Platelets 317 150 - 400 K/uL  Hepatic function panel     Status: Abnormal   Collection Time: 10/29/15  9:52 AM  Result Value Ref Range   Total Protein 7.7 6.5 - 8.1 g/dL   Albumin 3.9 3.5 - 5.0 g/dL   AST 21 15 - 41 U/L   ALT 23 14 - 54 U/L   Alkaline Phosphatase 74 38 - 126 U/L   Total Bilirubin 0.4 0.3 - 1.2 mg/dL   Bilirubin, Direct <0.1 (L) 0.1 - 0.5 mg/dL   Indirect Bilirubin NOT CALCULATED 0.3 - 0.9 mg/dL  Brain natriuretic peptide     Status: None   Collection Time: 10/29/15  9:52 AM  Result Value Ref Range   B Natriuretic Peptide 11.4 0.0 - 100.0 pg/mL  I-stat troponin, ED (not at [facility], [facility])     Status: None   Collection Time: 10/29/15 10:16 AM  Result Value Ref Range   Troponin i, poc 0.00 0.00 - 0.08 ng/mL   Comment 3  Comment: Due to the release kinetics of cTnI, a negative result within the first hours of the onset of symptoms does not rule out myocardial infarction with certainty. If myocardial infarction is still suspected, repeat the test at appropriate intervals.   I-Stat Beta hCG blood, ED ([facility], [facility], [facility] only)     Status: None   Collection Time: 10/29/15 11:34 AM  Result Value Ref Range   I-stat hCG, quantitative <5.0 <5 mIU/mL   Comment 3            Comment:   GEST. AGE      CONC.  (mIU/mL)   <=1 WEEK        5 - 50     2 WEEKS       50 - 500     3 WEEKS       100 - 10,000     4 WEEKS     1,000 - 30,000        FEMALE AND NON-PREGNANT FEMALE:     LESS THAN 5 mIU/mL   Urinalysis, Routine w reflex microscopic (not at [facility])     Status: Abnormal   Collection Time: 10/29/15 11:44 AM  Result Value Ref Range   Color, Urine YELLOW YELLOW   APPearance CLOUDY (A) CLEAR   Specific Gravity, Urine 1.009 1.005 - 1.030   pH 7.0 5.0 - 8.0   Glucose, UA NEGATIVE NEGATIVE mg/dL   Hgb  urine dipstick LARGE (A) NEGATIVE   Bilirubin Urine NEGATIVE NEGATIVE   Ketones, ur NEGATIVE NEGATIVE mg/dL   Protein, ur NEGATIVE NEGATIVE mg/dL   Nitrite NEGATIVE NEGATIVE   Leukocytes, UA TRACE (A) NEGATIVE  Urine microscopic-add on     Status: Abnormal   Collection Time: 10/29/15 11:44 AM  Result Value Ref Range   Squamous Epithelial / LPF 0-5 (A) NONE SEEN   WBC, UA 0-5 0 - 5 WBC/hpf   RBC / HPF 6-30 0 - 5 RBC/hpf   Bacteria, UA FEW (A) NONE SEEN   Casts HYALINE CASTS (A) NEGATIVE    Comment: GRANULAR CAST  POCT Activated clotting time     Status: None   Collection Time: 10/29/15  3:32 PM  Result Value Ref Range   Activated Clotting Time 188 seconds  Hemoglobin A1c     Status: Abnormal   Collection Time: 10/29/15  7:50 PM  Result Value Ref Range   Hgb A1c MFr Bld 6.8 (H) 4.8 - 5.6 %    Comment: (NOTE)         Pre-diabetes: 5.7 - 6.4         Diabetes: >6.4         Glycemic control for adults with diabetes: <7.0    Mean Plasma Glucose 148 mg/dL    Comment: (NOTE) Performed At: [facility] [place], [place] [id number] Lindon Romp MD [id number]   Protime-INR     Status: None   Collection Time: 10/29/15  7:50 PM  Result Value Ref Range   Prothrombin Time 14.5 11.6 - 15.2 seconds   INR 1.11 0.00 - 1.49  CBC     Status: Abnormal   Collection Time: 10/29/15  7:50 PM  Result Value Ref Range   WBC 11.3 (H) 4.0 - 10.5 K/uL   RBC 4.13 3.87 - 5.11 MIL/uL   Hemoglobin 12.2 12.0 - 15.0 g/dL   HCT 37.2 36.0 - 46.0 %   MCV 90.1 78.0 - 100.0 fL   MCH 29.5 26.0 - 34.0  pg   MCHC 32.8 30.0 - 36.0 g/dL   RDW 13.1 11.5 - 15.5 %   Platelets 267 150 - 400 K/uL  Creatinine, serum     Status: Abnormal   Collection Time: 10/29/15  7:50 PM  Result Value Ref Range   Creatinine, Ser 1.01 (H) 0.44 - 1.00 mg/dL   GFR calc non Af Amer >60 >60 mL/min   GFR calc Af Amer >60 >60 mL/min    Comment: (NOTE) The eGFR has been calculated using the CKD EPI  equation. This calculation has not been validated in all clinical situations. eGFR's persistently <60 mL/min signify possible Chronic Kidney Disease.   TSH     Status: None   Collection Time: 10/29/15  7:50 PM  Result Value Ref Range   TSH 0.675 0.350 - 4.500 uIU/mL  Comprehensive metabolic panel     Status: Abnormal   Collection Time: 10/30/15  3:00 AM  Result Value Ref Range   Sodium 137 135 - 145 mmol/L   Potassium 3.9 3.5 - 5.1 mmol/L   Chloride 106 101 - 111 mmol/L   CO2 22 22 - 32 mmol/L   Glucose, Bld 248 (H) 65 - 99 mg/dL   BUN 9 6 - 20 mg/dL   Creatinine, Ser 0.97 0.44 - 1.00 mg/dL   Calcium 8.8 (L) 8.9 - 10.3 mg/dL   Total Protein 6.1 (L) 6.5 - 8.1 g/dL   Albumin 3.3 (L) 3.5 - 5.0 g/dL   AST 22 15 - 41 U/L   ALT 20 14 - 54 U/L   Alkaline Phosphatase 64 38 - 126 U/L   Total Bilirubin 0.3 0.3 - 1.2 mg/dL   GFR calc non Af Amer >60 >60 mL/min   GFR calc Af Amer >60 >60 mL/min    Comment: (NOTE) The eGFR has been calculated using the CKD EPI equation. This calculation has not been validated in all clinical situations. eGFR's persistently <60 mL/min signify possible Chronic Kidney Disease.    Anion gap 9 5 - 15  Lipid panel     Status: Abnormal   Collection Time: 10/30/15  3:00 AM  Result Value Ref Range   Cholesterol 252 (H) 0 - 200 mg/dL   Triglycerides 145 <150 mg/dL   HDL 31 (L) >40 mg/dL   Total CHOL/HDL Ratio 8.1 RATIO   VLDL 29 0 - 40 mg/dL   LDL Cholesterol 192 (H) 0 - 99 mg/dL    Comment:        Total Cholesterol/HDL:CHD Risk Coronary Heart Disease Risk Table                     Men   Women  1/2 Average Risk   3.4   3.3  Average Risk       5.0   4.4  2 X Average Risk   9.6   7.1  3 X Average Risk  23.4   11.0        Use the calculated Patient Ratio above and the CHD Risk Table to determine the patient's CHD Risk.        ATP III CLASSIFICATION (LDL):  <100     mg/dL   Optimal  100-129  mg/dL   Near or Above                    Optimal  130-159   mg/dL   Borderline  160-189  mg/dL   High  >190     mg/dL  Very High   CBC     Status: Abnormal   Collection Time: 10/30/15  3:00 AM  Result Value Ref Range   WBC 12.3 (H) 4.0 - 10.5 K/uL   RBC 4.14 3.87 - 5.11 MIL/uL   Hemoglobin 12.3 12.0 - 15.0 g/dL   HCT 37.7 36.0 - 46.0 %   MCV 91.1 78.0 - 100.0 fL   MCH 29.7 26.0 - 34.0 pg   MCHC 32.6 30.0 - 36.0 g/dL   RDW 13.2 11.5 - 15.5 %   Platelets 268 150 - 400 K/uL  Protime-INR     Status: None   Collection Time: 10/30/15  3:00 AM  Result Value Ref Range   Prothrombin Time 14.0 11.6 - 15.2 seconds   INR 1.06 0.00 - 1.49    Imaging: Imaging results have been reviewed  Tele- NSR  Assessment/Plan:   1. Active Problems: 2.   Chest pain, exertional 3.   Chest pain 4.   Coronary artery disease due to lipid rich plaque 5.   Time Spent Directly with Patient:  20 minutes  Length of Stay:  LOS: 1 day    S/P cath yesterday in setting of crescendo angina. Switched from radial to femoral secondary to spasm. Pt has ostial 80% LM with damping and nl LV fxn. Needs CABG. Had some CP last PM prob secondary to anxiety improved with xanax. Will start low dose BB and topical NTG. On ASA and high dose statin. Dr. Roxan Hockey to see today. Hopefully can do Monday. Keep in ICU until surgery.     Quay Burow 10/30/2015, 8:33 AM

## 2015-10-30 NOTE — Consult Note (Signed)
Reason for Consult:Left main coronary artery disease Referring Physician: Dr. Gwenlyn Found [name].  HPI: 50 yo woman admitted with a cc/o CP  Brenda Cruz is a 50 yo woman with a past history of tobacco abuse, reflux, recently diagnosed hypertension and hyperlipidemia, borderline diabetes, obesity and a strong family history of CAD. She has no prior history of cardiac disease.Over the past several months she has been having chest pain with exertion. She thought it might be anxiety or her reflux. She saw her MD at the end of the year and a stress test was planned but had not yet been done at the time of admission.  She was walking at work yesterday when she developed severe substernal chest pain and shortness of breath. She noted some loss of peripheral vision and thought she might pass out, but never did lose consciousness. In the ED her troponin was negative and her ECG showed no acute changes. Her pain had resolved, but Dr. Gwenlyn Found was concerned and proceeded with catheterization. She had an 80 % left main stenosis. She is currently pain free.     Past Medical History  Diagnosis Date  . Asthma   . GERD (gastroesophageal reflux disease)   . DDD (degenerative disc disease)   . Hyperlipemia   . Anxiety   . Hemorrhoids   . TMJ (temporomandibular joint disorder)     Past Surgical History  Procedure Laterality Date  . Cholecystectomy    . Lumbar fusion    . Cardiac catheterization N/A 10/29/2015    Procedure: Left Heart Cath and Coronary Angiography;  Surgeon: [name], MD;  Location: [facility];  Service: Cardiovascular;  Laterality: N/A;    Family History  Problem Relation Age of Onset  . Colon cancer Neg Hx   . Colon polyps Father   . Stomach cancer Paternal Grandmother   . Esophageal cancer Maternal Grandfather   . Breast cancer Mother   . Ovarian cancer      Maternal Side Great  Aunt    Social History:  reports  that she has been smoking.  She has never used smokeless tobacco. She reports that she does not drink alcohol or use illicit drugs.  Allergies:  Allergies  Allergen Reactions  . Iohexol      Code: RASH, Desc: White blisters in mouth during ivp in [place] '93, ok w/ 13 hour prep today//a.[place], Onset Date: [id number]   . Penicillins Other (See Comments)  Zithromax Sulfa  Medications:  Scheduled: . albuterol  2.5 mg Nebulization Once  . aspirin  81 mg Oral Daily  . atorvastatin  80 mg Oral q1800  . FLUoxetine  20 mg Oral Daily  . heparin  5,000 Units Subcutaneous 3 times per day  . ibuprofen  600 mg Oral Once  . metoprolol tartrate  12.5 mg Oral BID  . naproxen  500 mg Oral BID WC  . nitroGLYCERIN  0.5 inch Topical 4 times per day  . pantoprazole  40 mg Oral Daily  . sodium chloride  3 mL Intravenous Q12H    Results for orders placed or performed during the hospital encounter of 10/29/15 (from the past 48 hour(s))  Basic metabolic panel     Status: Abnormal   Collection Time: 10/29/15  9:52 AM  Result Value Ref Range   Sodium 140 135 - 145 mmol/L   Potassium 3.9 3.5 - 5.1 mmol/L   Chloride 105 101 -  111 mmol/L   CO2 26 22 - 32 mmol/L   Glucose, Bld 123 (H) 65 - 99 mg/dL   BUN 7 6 - 20 mg/dL   Creatinine, Ser 0.93 0.44 - 1.00 mg/dL   Calcium 9.2 8.9 - 10.3 mg/dL   GFR calc non Af Amer >60 >60 mL/min   GFR calc Af Amer >60 >60 mL/min    Comment: (NOTE) The eGFR has been calculated using the CKD EPI equation. This calculation has not been validated in all clinical situations. eGFR's persistently <60 mL/min signify possible Chronic Kidney Disease.    Anion gap 9 5 - 15  CBC     Status: None   Collection Time: 10/29/15  9:52 AM  Result Value Ref Range   WBC 10.3 4.0 - 10.5 K/uL   RBC 4.48 3.87 - 5.11 MIL/uL   Hemoglobin 13.6 12.0 - 15.0 g/dL   HCT 40.9 36.0 - 46.0 %   MCV 91.3 78.0 - 100.0 fL   MCH 30.4 26.0 - 34.0 pg   MCHC 33.3 30.0 - 36.0 g/dL   RDW 13.1  11.5 - 15.5 %   Platelets 317 150 - 400 K/uL  Hepatic function panel     Status: Abnormal   Collection Time: 10/29/15  9:52 AM  Result Value Ref Range   Total Protein 7.7 6.5 - 8.1 g/dL   Albumin 3.9 3.5 - 5.0 g/dL   AST 21 15 - 41 U/L   ALT 23 14 - 54 U/L   Alkaline Phosphatase 74 38 - 126 U/L   Total Bilirubin 0.4 0.3 - 1.2 mg/dL   Bilirubin, Direct <0.1 (L) 0.1 - 0.5 mg/dL   Indirect Bilirubin NOT CALCULATED 0.3 - 0.9 mg/dL  Brain natriuretic peptide     Status: None   Collection Time: 10/29/15  9:52 AM  Result Value Ref Range   B Natriuretic Peptide 11.4 0.0 - 100.0 pg/mL  I-stat troponin, ED (not at [facility])     Status: None   Collection Time: 10/29/15 10:16 AM  Result Value Ref Range   Troponin i, poc 0.00 0.00 - 0.08 ng/mL   Comment 3            Comment: Due to the release kinetics of cTnI, a negative result within the first hours of the onset of symptoms does not rule out myocardial infarction with certainty. If myocardial infarction is still suspected, repeat the test at appropriate intervals.   I-Stat Beta hCG blood, ED ([facility], [facility], [facility] only)     Status: None   Collection Time: 10/29/15 11:34 AM  Result Value Ref Range   I-stat hCG, quantitative <5.0 <5 mIU/mL   Comment 3            Comment:   GEST. AGE      CONC.  (mIU/mL)   <=1 WEEK        5 - 50     2 WEEKS       50 - 500     3 WEEKS       100 - 10,000     4 WEEKS     1,000 - 30,000        FEMALE AND NON-PREGNANT FEMALE:     LESS THAN 5 mIU/mL   Urinalysis, Routine w reflex microscopic (not at [facility])     Status: Abnormal   Collection Time: 10/29/15 11:44 AM  Result Value Ref Range   Color, Urine YELLOW YELLOW   APPearance CLOUDY (A) CLEAR  Specific Gravity, Urine 1.009 1.005 - 1.030   pH 7.0 5.0 - 8.0   Glucose, UA NEGATIVE NEGATIVE mg/dL   Hgb urine dipstick LARGE (A) NEGATIVE   Bilirubin Urine NEGATIVE NEGATIVE   Ketones, ur NEGATIVE NEGATIVE mg/dL   Protein, ur NEGATIVE NEGATIVE mg/dL   Nitrite  NEGATIVE NEGATIVE   Leukocytes, UA TRACE (A) NEGATIVE  Urine microscopic-add on     Status: Abnormal   Collection Time: 10/29/15 11:44 AM  Result Value Ref Range   Squamous Epithelial / LPF 0-5 (A) NONE SEEN   WBC, UA 0-5 0 - 5 WBC/hpf   RBC / HPF 6-30 0 - 5 RBC/hpf   Bacteria, UA FEW (A) NONE SEEN   Casts HYALINE CASTS (A) NEGATIVE    Comment: GRANULAR CAST  POCT Activated clotting time     Status: None   Collection Time: 10/29/15  3:32 PM  Result Value Ref Range   Activated Clotting Time 188 seconds  Hemoglobin A1c     Status: Abnormal   Collection Time: 10/29/15  7:50 PM  Result Value Ref Range   Hgb A1c MFr Bld 6.8 (H) 4.8 - 5.6 %    Comment: (NOTE)         Pre-diabetes: 5.7 - 6.4         Diabetes: >6.4         Glycemic control for adults with diabetes: <7.0    Mean Plasma Glucose 148 mg/dL    Comment: (NOTE) Performed At: [facility] [place], [place] [id number] Lindon Romp MD QX:[id number]   Protime-INR     Status: None   Collection Time: 10/29/15  7:50 PM  Result Value Ref Range   Prothrombin Time 14.5 11.6 - 15.2 seconds   INR 1.11 0.00 - 1.49  CBC     Status: Abnormal   Collection Time: 10/29/15  7:50 PM  Result Value Ref Range   WBC 11.3 (H) 4.0 - 10.5 K/uL   RBC 4.13 3.87 - 5.11 MIL/uL   Hemoglobin 12.2 12.0 - 15.0 g/dL   HCT 37.2 36.0 - 46.0 %   MCV 90.1 78.0 - 100.0 fL   MCH 29.5 26.0 - 34.0 pg   MCHC 32.8 30.0 - 36.0 g/dL   RDW 13.1 11.5 - 15.5 %   Platelets 267 150 - 400 K/uL  Creatinine, serum     Status: Abnormal   Collection Time: 10/29/15  7:50 PM  Result Value Ref Range   Creatinine, Ser 1.01 (H) 0.44 - 1.00 mg/dL   GFR calc non Af Amer >60 >60 mL/min   GFR calc Af Amer >60 >60 mL/min    Comment: (NOTE) The eGFR has been calculated using the CKD EPI equation. This calculation has not been validated in all clinical situations. eGFR's persistently <60 mL/min signify possible Chronic Kidney Disease.   TSH      Status: None   Collection Time: 10/29/15  7:50 PM  Result Value Ref Range   TSH 0.675 0.350 - 4.500 uIU/mL  Comprehensive metabolic panel     Status: Abnormal   Collection Time: 10/30/15  3:00 AM  Result Value Ref Range   Sodium 137 135 - 145 mmol/L   Potassium 3.9 3.5 - 5.1 mmol/L   Chloride 106 101 - 111 mmol/L   CO2 22 22 - 32 mmol/L   Glucose, Bld 248 (H) 65 - 99 mg/dL   BUN 9 6 - 20 mg/dL   Creatinine, Ser 0.97 0.44 - 1.00 mg/dL  Calcium 8.8 (L) 8.9 - 10.3 mg/dL   Total Protein 6.1 (L) 6.5 - 8.1 g/dL   Albumin 3.3 (L) 3.5 - 5.0 g/dL   AST 22 15 - 41 U/L   ALT 20 14 - 54 U/L   Alkaline Phosphatase 64 38 - 126 U/L   Total Bilirubin 0.3 0.3 - 1.2 mg/dL   GFR calc non Af Amer >60 >60 mL/min   GFR calc Af Amer >60 >60 mL/min    Comment: (NOTE) The eGFR has been calculated using the CKD EPI equation. This calculation has not been validated in all clinical situations. eGFR's persistently <60 mL/min signify possible Chronic Kidney Disease.    Anion gap 9 5 - 15  Lipid panel     Status: Abnormal   Collection Time: 10/30/15  3:00 AM  Result Value Ref Range   Cholesterol 252 (H) 0 - 200 mg/dL   Triglycerides 145 <150 mg/dL   HDL 31 (L) >40 mg/dL   Total CHOL/HDL Ratio 8.1 RATIO   VLDL 29 0 - 40 mg/dL   LDL Cholesterol 192 (H) 0 - 99 mg/dL    Comment:        Total Cholesterol/HDL:CHD Risk Coronary Heart Disease Risk Table                     Men   Women  1/2 Average Risk   3.4   3.3  Average Risk       5.0   4.4  2 X Average Risk   9.6   7.1  3 X Average Risk  23.4   11.0        Use the calculated Patient Ratio above and the CHD Risk Table to determine the patient's CHD Risk.        ATP III CLASSIFICATION (LDL):  <100     mg/dL   Optimal  100-129  mg/dL   Near or Above                    Optimal  130-159  mg/dL   Borderline  160-189  mg/dL   High  >190     mg/dL   Very High   CBC     Status: Abnormal   Collection Time: 10/30/15  3:00 AM  Result Value Ref Range    WBC 12.3 (H) 4.0 - 10.5 K/uL   RBC 4.14 3.87 - 5.11 MIL/uL   Hemoglobin 12.3 12.0 - 15.0 g/dL   HCT 37.7 36.0 - 46.0 %   MCV 91.1 78.0 - 100.0 fL   MCH 29.7 26.0 - 34.0 pg   MCHC 32.6 30.0 - 36.0 g/dL   RDW 13.2 11.5 - 15.5 %   Platelets 268 150 - 400 K/uL  Protime-INR     Status: None   Collection Time: 10/30/15  3:00 AM  Result Value Ref Range   Prothrombin Time 14.0 11.6 - 15.2 seconds   INR 1.06 0.00 - 1.49    Dg Chest 2 View  10/29/2015  CLINICAL DATA:  Mid chest pressure off and on for 3 weeks with exertion, hypertension, asthma, smoker EXAM: CHEST  2 VIEW COMPARISON:  03/20/2014 ; correlation CT chest 10/13/2011 FINDINGS: Normal heart size, mediastinal contours, and pulmonary vascularity. Minimal central peribronchial thickening. Focus of scarring in lingula adjacent to LEFT heart border similar to prior CT. No acute infiltrate, pleural effusion or pneumothorax. Bones unremarkable. IMPRESSION: Bronchitic changes with lingular scarring. No acute abnormalities. Electronically Signed   By:  Lavonia Dana M.D.   On: 10/29/2015 10:27    Review of Systems  Constitutional: Positive for malaise/fatigue. Negative for fever and chills.  Respiratory: Positive for shortness of breath. Negative for cough and wheezing.   Cardiovascular: Positive for chest pain. Negative for orthopnea and claudication.  Gastrointestinal: Negative for nausea, vomiting and blood in stool.  Genitourinary: Negative for dysuria and hematuria.  Musculoskeletal: Positive for back pain and joint pain.  Endo/Heme/Allergies: Does not bruise/bleed easily.  Psychiatric/Behavioral: The patient is nervous/anxious.   All other systems reviewed and are negative.  Blood pressure 124/75, pulse 78, temperature 98.3 F (36.8 C), temperature source Oral, resp. rate 15, height '5\' 7"'$  (1.702 m), weight 221 lb 14.4 oz (100.653 kg), last menstrual period 10/10/2015, SpO2 100 %. Physical Exam  Vitals reviewed. Constitutional: She is  oriented to person, place, and time. No distress.  obese  HENT:  Head: Normocephalic and atraumatic.  Mouth/Throat: No oropharyngeal exudate.  Eyes: Conjunctivae and EOM are normal. Pupils are equal, round, and reactive to light. No scleral icterus.  Neck: Neck supple. No thyromegaly present.  Cardiovascular: Normal rate, regular rhythm, normal heart sounds and intact distal pulses.  Exam reveals no gallop and no friction rub.   No murmur heard. Respiratory: Effort normal and breath sounds normal. She has no wheezes. She has no rales.  GI: Soft. She exhibits no distension. There is no tenderness.  Musculoskeletal: She exhibits no edema or tenderness.  Lymphadenopathy:    She has no cervical adenopathy.  Neurological: She is alert and oriented to person, place, and time. No cranial nerve deficit. She exhibits normal muscle tone.  Motor intact  Skin: Skin is warm and dry.  Psychiatric:  Anxious, tearful   I personally reviewed the cardiac catheterization. She has ostial left main disease.  Assessment/Plan: 50 yo woman with multiple CRF including hyperlipidemia, hypertension, tobacco abuse, and a strong family history presents with progressive exertional angina. She ruled out for MI. Catheterization revealed left main disease.  CABG (LIMA to LAD) is indicated for survival benefit and relief of symptoms.  I have discussed the general nature of the procedure, the need for general anesthesia, and the incisions to be used with Brenda Cruz and her mother who was also present. We discussed the expected hospital stay, overall recovery and short and long term outcomes. She understands the risks include, but are not limited to death, stroke, MI, DVT/PE, bleeding, possible need for transfusion, infections, cardiac arrhythmias, and other organ system dysfunction including respiratory, renal, or GI complications. She accepts the risks and agrees to proceed.  In addition to other potential risks and  complications from the surgery, I have made the patient aware of the recent [place] concerning the risk of infection by Myocobacterium chimaera related to the use of [employer]ert 3T heater-cooler equipment during cardiac surgery. I discussed with the patient the low risk of infection, as well as our compliance with the most current FDA recommendations to minimize infection and testing of all devices for contamination. The patient has been made aware of the limited alternatives to immediately replacing the current equipment. The patient has been informed regarding the risks associated with waiting to proceed with needed surgery and that such risks are greater than the risk of infection related to the use of the heater-cooler device. I did make the patient aware that after careful review of the patients having cardiac surgery at [facility] we have no evidence that heater/cooler related infections have occurred at [facility]. We discussed that this is a slow-growing bacterium, such that it can take some period of time for symptoms to develop.  For CABG Monday 1/16 Melrose Nakayama 10/30/2015, 3:02 PM

## 2015-10-31 ENCOUNTER — Other Ambulatory Visit (HOSPITAL_COMMUNITY): Payer: BLUE CROSS/BLUE SHIELD

## 2015-10-31 DIAGNOSIS — E785 Hyperlipidemia, unspecified: Secondary | ICD-10-CM

## 2015-10-31 MED ORDER — SIMETHICONE 80 MG PO CHEW
80.0000 mg | CHEWABLE_TABLET | Freq: Four times a day (QID) | ORAL | Status: DC | PRN
  Administered 2015-10-31 – 2015-11-05 (×2): 80 mg via ORAL
  Filled 2015-10-31 (×2): qty 1

## 2015-10-31 NOTE — Progress Notes (Signed)
Subjective:  POD # 2 Cath revealing isolated 80% ostial LM with Nl LV Fxn.. Trop neg. No CP/SOB. For CABG Monday with Dr. Roxan Hockey (LIMA---> LAD)  Objective:  Temp:  [98.2 F (36.8 C)-98.3 F (36.8 C)] 98.2 F (36.8 C) (01/14 0803) Pulse Rate:  [65-89] 67 (01/14 0800) Resp:  [9-25] 13 (01/14 0800) BP: (111-144)/(60-93) 132/84 mmHg (01/14 0600) SpO2:  [90 %-100 %] 95 % (01/14 0800) Weight:  [221 lb 12.5 oz (100.6 kg)-224 lb (101.606 kg)] 224 lb (101.606 kg) (01/14 0500) Weight change: -1.9 oz (-0.053 kg)  Intake/Output from previous day: 01/13 0701 - 01/14 0700 In: 960 [P.O.:960] Out: 1200 [Urine:1200]  Intake/Output from this shift:    Physical Exam: General appearance: alert and no distress Neck: no adenopathy, no carotid bruit, no JVD, supple, symmetrical, trachea midline and thyroid not enlarged, symmetric, no tenderness/mass/nodules Lungs: clear to auscultation bilaterally Heart: regular rate and rhythm, S1, S2 normal, no murmur, click, rub or gallop Extremities: extremities normal, atraumatic, no cyanosis or edema  Lab Results: Results for orders placed or performed during the hospital encounter of 10/29/15 (from the past 48 hour(s))  Basic metabolic panel     Status: Abnormal   Collection Time: 10/29/15  9:52 AM  Result Value Ref Range   Sodium 140 135 - 145 mmol/L   Potassium 3.9 3.5 - 5.1 mmol/L   Chloride 105 101 - 111 mmol/L   CO2 26 22 - 32 mmol/L   Glucose, Bld 123 (H) 65 - 99 mg/dL   BUN 7 6 - 20 mg/dL   Creatinine, Ser 0.93 0.44 - 1.00 mg/dL   Calcium 9.2 8.9 - 10.3 mg/dL   GFR calc non Af Amer >60 >60 mL/min   GFR calc Af Amer >60 >60 mL/min    Comment: (NOTE) The eGFR has been calculated using the CKD EPI equation. This calculation has not been validated in all clinical situations. eGFR's persistently <60 mL/min signify possible Chronic Kidney Disease.    Anion gap 9 5 - 15  CBC     Status: None   Collection Time: 10/29/15  9:52 AM    Result Value Ref Range   WBC 10.3 4.0 - 10.5 K/uL   RBC 4.48 3.87 - 5.11 MIL/uL   Hemoglobin 13.6 12.0 - 15.0 g/dL   HCT 40.9 36.0 - 46.0 %   MCV 91.3 78.0 - 100.0 fL   MCH 30.4 26.0 - 34.0 pg   MCHC 33.3 30.0 - 36.0 g/dL   RDW 13.1 11.5 - 15.5 %   Platelets 317 150 - 400 K/uL  Hepatic function panel     Status: Abnormal   Collection Time: 10/29/15  9:52 AM  Result Value Ref Range   Total Protein 7.7 6.5 - 8.1 g/dL   Albumin 3.9 3.5 - 5.0 g/dL   AST 21 15 - 41 U/L   ALT 23 14 - 54 U/L   Alkaline Phosphatase 74 38 - 126 U/L   Total Bilirubin 0.4 0.3 - 1.2 mg/dL   Bilirubin, Direct <0.1 (L) 0.1 - 0.5 mg/dL   Indirect Bilirubin NOT CALCULATED 0.3 - 0.9 mg/dL  Brain natriuretic peptide     Status: None   Collection Time: 10/29/15  9:52 AM  Result Value Ref Range   B Natriuretic Peptide 11.4 0.0 - 100.0 pg/mL  I-stat troponin, ED (not at [facility])     Status: None   Collection Time: 10/29/15 10:16 AM  Result Value Ref Range   Troponin  i, poc 0.00 0.00 - 0.08 ng/mL   Comment 3            Comment: Due to the release kinetics of cTnI, a negative result within the first hours of the onset of symptoms does not rule out myocardial infarction with certainty. If myocardial infarction is still suspected, repeat the test at appropriate intervals.   I-Stat Beta hCG blood, ED ([facility], [facility], [facility] only)     Status: None   Collection Time: 10/29/15 11:34 AM  Result Value Ref Range   I-stat hCG, quantitative <5.0 <5 mIU/mL   Comment 3            Comment:   GEST. AGE      CONC.  (mIU/mL)   <=1 WEEK        5 - 50     2 WEEKS       50 - 500     3 WEEKS       100 - 10,000     4 WEEKS     1,000 - 30,000        FEMALE AND NON-PREGNANT FEMALE:     LESS THAN 5 mIU/mL   Urinalysis, Routine w reflex microscopic (not at [facility])     Status: Abnormal   Collection Time: 10/29/15 11:44 AM  Result Value Ref Range   Color, Urine YELLOW YELLOW   APPearance CLOUDY (A) CLEAR   Specific Gravity, Urine 1.009  1.005 - 1.030   pH 7.0 5.0 - 8.0   Glucose, UA NEGATIVE NEGATIVE mg/dL   Hgb urine dipstick LARGE (A) NEGATIVE   Bilirubin Urine NEGATIVE NEGATIVE   Ketones, ur NEGATIVE NEGATIVE mg/dL   Protein, ur NEGATIVE NEGATIVE mg/dL   Nitrite NEGATIVE NEGATIVE   Leukocytes, UA TRACE (A) NEGATIVE  Urine microscopic-add on     Status: Abnormal   Collection Time: 10/29/15 11:44 AM  Result Value Ref Range   Squamous Epithelial / LPF 0-5 (A) NONE SEEN   WBC, UA 0-5 0 - 5 WBC/hpf   RBC / HPF 6-30 0 - 5 RBC/hpf   Bacteria, UA FEW (A) NONE SEEN   Casts HYALINE CASTS (A) NEGATIVE    Comment: GRANULAR CAST  POCT Activated clotting time     Status: None   Collection Time: 10/29/15  3:32 PM  Result Value Ref Range   Activated Clotting Time 188 seconds  Hemoglobin A1c     Status: Abnormal   Collection Time: 10/29/15  7:50 PM  Result Value Ref Range   Hgb A1c MFr Bld 6.8 (H) 4.8 - 5.6 %    Comment: (NOTE)         Pre-diabetes: 5.7 - 6.4         Diabetes: >6.4         Glycemic control for adults with diabetes: <7.0    Mean Plasma Glucose 148 mg/dL    Comment: (NOTE) Performed At: [facility] [place], [place] [id number] Lindon Romp MD ZT:[id number]   Protime-INR     Status: None   Collection Time: 10/29/15  7:50 PM  Result Value Ref Range   Prothrombin Time 14.5 11.6 - 15.2 seconds   INR 1.11 0.00 - 1.49  CBC     Status: Abnormal   Collection Time: 10/29/15  7:50 PM  Result Value Ref Range   WBC 11.3 (H) 4.0 - 10.5 K/uL   RBC 4.13 3.87 - 5.11 MIL/uL   Hemoglobin 12.2 12.0 - 15.0 g/dL  HCT 37.2 36.0 - 46.0 %   MCV 90.1 78.0 - 100.0 fL   MCH 29.5 26.0 - 34.0 pg   MCHC 32.8 30.0 - 36.0 g/dL   RDW 13.1 11.5 - 15.5 %   Platelets 267 150 - 400 K/uL  Creatinine, serum     Status: Abnormal   Collection Time: 10/29/15  7:50 PM  Result Value Ref Range   Creatinine, Ser 1.01 (H) 0.44 - 1.00 mg/dL   GFR calc non Af Amer >60 >60 mL/min   GFR calc Af Amer >60 >60  mL/min    Comment: (NOTE) The eGFR has been calculated using the CKD EPI equation. This calculation has not been validated in all clinical situations. eGFR's persistently <60 mL/min signify possible Chronic Kidney Disease.   TSH     Status: None   Collection Time: 10/29/15  7:50 PM  Result Value Ref Range   TSH 0.675 0.350 - 4.500 uIU/mL  Comprehensive metabolic panel     Status: Abnormal   Collection Time: 10/30/15  3:00 AM  Result Value Ref Range   Sodium 137 135 - 145 mmol/L   Potassium 3.9 3.5 - 5.1 mmol/L   Chloride 106 101 - 111 mmol/L   CO2 22 22 - 32 mmol/L   Glucose, Bld 248 (H) 65 - 99 mg/dL   BUN 9 6 - 20 mg/dL   Creatinine, Ser 0.97 0.44 - 1.00 mg/dL   Calcium 8.8 (L) 8.9 - 10.3 mg/dL   Total Protein 6.1 (L) 6.5 - 8.1 g/dL   Albumin 3.3 (L) 3.5 - 5.0 g/dL   AST 22 15 - 41 U/L   ALT 20 14 - 54 U/L   Alkaline Phosphatase 64 38 - 126 U/L   Total Bilirubin 0.3 0.3 - 1.2 mg/dL   GFR calc non Af Amer >60 >60 mL/min   GFR calc Af Amer >60 >60 mL/min    Comment: (NOTE) The eGFR has been calculated using the CKD EPI equation. This calculation has not been validated in all clinical situations. eGFR's persistently <60 mL/min signify possible Chronic Kidney Disease.    Anion gap 9 5 - 15  Lipid panel     Status: Abnormal   Collection Time: 10/30/15  3:00 AM  Result Value Ref Range   Cholesterol 252 (H) 0 - 200 mg/dL   Triglycerides 145 <150 mg/dL   HDL 31 (L) >40 mg/dL   Total CHOL/HDL Ratio 8.1 RATIO   VLDL 29 0 - 40 mg/dL   LDL Cholesterol 192 (H) 0 - 99 mg/dL    Comment:        Total Cholesterol/HDL:CHD Risk Coronary Heart Disease Risk Table                     Men   Women  1/2 Average Risk   3.4   3.3  Average Risk       5.0   4.4  2 X Average Risk   9.6   7.1  3 X Average Risk  23.4   11.0        Use the calculated Patient Ratio above and the CHD Risk Table to determine the patient's CHD Risk.        ATP III CLASSIFICATION (LDL):  <100     mg/dL    Optimal  100-129  mg/dL   Near or Above                    Optimal  130-159  mg/dL   Borderline  160-189  mg/dL   High  >190     mg/dL   Very High   CBC     Status: Abnormal   Collection Time: 10/30/15  3:00 AM  Result Value Ref Range   WBC 12.3 (H) 4.0 - 10.5 K/uL   RBC 4.14 3.87 - 5.11 MIL/uL   Hemoglobin 12.3 12.0 - 15.0 g/dL   HCT 37.7 36.0 - 46.0 %   MCV 91.1 78.0 - 100.0 fL   MCH 29.7 26.0 - 34.0 pg   MCHC 32.6 30.0 - 36.0 g/dL   RDW 13.2 11.5 - 15.5 %   Platelets 268 150 - 400 K/uL  Protime-INR     Status: None   Collection Time: 10/30/15  3:00 AM  Result Value Ref Range   Prothrombin Time 14.0 11.6 - 15.2 seconds   INR 1.06 0.00 - 1.49    Imaging: Imaging results have been reviewed    Assessment/Plan:   1. Active Problems: 2.   Chest pain, exertional 3.   Chest pain 4.   Coronary artery disease due to lipid rich plaque 5.   Time Spent Directly with Patient:  20 minutes  Length of Stay:  LOS: 2 days   No CP/SOB. Cath on 1/12 showed 80% ostial LM (only disease) with nl LV fxn. On approp meds. Labs OK (Lipids and HGB A1C high). Plan CABG (LIMA-->> LAD) Monday.  Quay Burow 10/31/2015, 9:51 AM

## 2015-10-31 NOTE — Plan of Care (Signed)
Problem: Phase II Progression Outcomes Goal: Ambulates up to 600 ft. in hall x 1 Outcome: Not Applicable Date Met:  59/16/38 Pt ambulates well and frequently in room, but is currently restricted to that until after surgery

## 2015-11-01 ENCOUNTER — Inpatient Hospital Stay (HOSPITAL_COMMUNITY): Payer: Managed Care, Other (non HMO)

## 2015-11-01 DIAGNOSIS — I2510 Atherosclerotic heart disease of native coronary artery without angina pectoris: Secondary | ICD-10-CM

## 2015-11-01 LAB — GLUCOSE, CAPILLARY
GLUCOSE-CAPILLARY: 148 mg/dL — AB (ref 65–99)
Glucose-Capillary: 128 mg/dL — ABNORMAL HIGH (ref 65–99)

## 2015-11-01 LAB — TYPE AND SCREEN
ABO/RH(D): A POS
Antibody Screen: NEGATIVE

## 2015-11-01 LAB — SURGICAL PCR SCREEN
MRSA, PCR: NEGATIVE
Staphylococcus aureus: NEGATIVE

## 2015-11-01 LAB — MRSA PCR SCREENING: MRSA BY PCR: NEGATIVE

## 2015-11-01 LAB — APTT: aPTT: 37 seconds (ref 24–37)

## 2015-11-01 LAB — ABO/RH: ABO/RH(D): A POS

## 2015-11-01 MED ORDER — PHENYLEPHRINE HCL 10 MG/ML IJ SOLN
30.0000 ug/min | INTRAVENOUS | Status: AC
  Administered 2015-11-02: 5 ug/min via INTRAVENOUS
  Filled 2015-11-01: qty 2

## 2015-11-01 MED ORDER — POTASSIUM CHLORIDE 2 MEQ/ML IV SOLN
80.0000 meq | INTRAVENOUS | Status: DC
  Filled 2015-11-01: qty 40

## 2015-11-01 MED ORDER — CHLORHEXIDINE GLUCONATE CLOTH 2 % EX PADS
6.0000 | MEDICATED_PAD | Freq: Once | CUTANEOUS | Status: AC
  Administered 2015-11-02: 6 via TOPICAL

## 2015-11-01 MED ORDER — LEVOFLOXACIN IN D5W 500 MG/100ML IV SOLN
500.0000 mg | INTRAVENOUS | Status: AC
  Administered 2015-11-02: 500 mg via INTRAVENOUS
  Filled 2015-11-01 (×2): qty 100

## 2015-11-01 MED ORDER — INSULIN ASPART 100 UNIT/ML ~~LOC~~ SOLN
0.0000 [IU] | Freq: Three times a day (TID) | SUBCUTANEOUS | Status: DC
Start: 2015-11-01 — End: 2015-11-02
  Administered 2015-11-01 (×2): 3 [IU] via SUBCUTANEOUS

## 2015-11-01 MED ORDER — SODIUM CHLORIDE 0.9 % IV SOLN
INTRAVENOUS | Status: DC
  Filled 2015-11-01: qty 30

## 2015-11-01 MED ORDER — INFLUENZA VAC SPLIT QUAD 0.5 ML IM SUSY
0.5000 mL | PREFILLED_SYRINGE | INTRAMUSCULAR | Status: DC
  Filled 2015-11-01: qty 0.5

## 2015-11-01 MED ORDER — DIAZEPAM 5 MG PO TABS: 5.0000 mg | ORAL_TABLET | Freq: Once | ORAL | Status: DC

## 2015-11-01 MED ORDER — TEMAZEPAM 15 MG PO CAPS: 15.0000 mg | ORAL_CAPSULE | Freq: Once | ORAL | Status: DC | PRN

## 2015-11-01 MED ORDER — NITROGLYCERIN IN D5W 200-5 MCG/ML-% IV SOLN
2.0000 ug/min | INTRAVENOUS | Status: AC
  Administered 2015-11-02: 5 ug/kg/min via INTRAVENOUS
  Filled 2015-11-01: qty 250

## 2015-11-01 MED ORDER — DOPAMINE-DEXTROSE 3.2-5 MG/ML-% IV SOLN
0.0000 ug/kg/min | INTRAVENOUS | Status: DC
  Filled 2015-11-01: qty 250

## 2015-11-01 MED ORDER — CHLORHEXIDINE GLUCONATE CLOTH 2 % EX PADS
6.0000 | MEDICATED_PAD | Freq: Once | CUTANEOUS | Status: AC
  Administered 2015-11-01: 6 via TOPICAL

## 2015-11-01 MED ORDER — INSULIN REGULAR HUMAN 100 UNIT/ML IJ SOLN
INTRAMUSCULAR | Status: AC
  Administered 2015-11-02: 1.8 [IU]/h via INTRAVENOUS
  Filled 2015-11-01: qty 2.5

## 2015-11-01 MED ORDER — LORAZEPAM 1 MG PO TABS
1.0000 mg | ORAL_TABLET | Freq: Every day | ORAL | Status: DC
  Administered 2015-11-01 – 2015-11-05 (×4): 1 mg via ORAL
  Filled 2015-11-01 (×3): qty 1

## 2015-11-01 MED ORDER — DEXMEDETOMIDINE HCL IN NACL 400 MCG/100ML IV SOLN
0.1000 ug/kg/h | INTRAVENOUS | Status: AC
  Administered 2015-11-02: .7 ug/kg/h via INTRAVENOUS
  Filled 2015-11-01: qty 100

## 2015-11-01 MED ORDER — INSULIN ASPART 100 UNIT/ML ~~LOC~~ SOLN: 0.0000 [IU] | Freq: Every day | SUBCUTANEOUS | Status: DC

## 2015-11-01 MED ORDER — MAGNESIUM SULFATE 50 % IJ SOLN
40.0000 meq | INTRAMUSCULAR | Status: DC
Start: 2015-11-02 — End: 2015-11-02
  Filled 2015-11-01: qty 10

## 2015-11-01 MED ORDER — PLASMA-LYTE 148 IV SOLN
INTRAVENOUS | Status: AC
  Administered 2015-11-02: 09:00:00
  Filled 2015-11-01: qty 2.5

## 2015-11-01 MED ORDER — SODIUM CHLORIDE 0.9 % IV SOLN
INTRAVENOUS | Status: AC
  Administered 2015-11-02: 69.8 mL/h via INTRAVENOUS
  Filled 2015-11-01: qty 40

## 2015-11-01 MED ORDER — BISACODYL 5 MG PO TBEC: 5.0000 mg | DELAYED_RELEASE_TABLET | Freq: Once | ORAL | Status: DC

## 2015-11-01 MED ORDER — CHLORHEXIDINE GLUCONATE 0.12 % MT SOLN: 15.0000 mL | Freq: Once | OROMUCOSAL | Status: DC

## 2015-11-01 MED ORDER — SODIUM CHLORIDE 0.9 % IV SOLN
1250.0000 mg | INTRAVENOUS | Status: AC
  Administered 2015-11-02: 1250 mg via INTRAVENOUS
  Filled 2015-11-01: qty 1250

## 2015-11-01 MED ORDER — EPINEPHRINE HCL 1 MG/ML IJ SOLN
0.0000 ug/min | INTRAVENOUS | Status: DC
  Filled 2015-11-01: qty 4

## 2015-11-01 NOTE — Progress Notes (Signed)
     Subjective:  Mild CP this AM prob secondary to anxiety, POD # 3 cath with isolated ostial LM for 1 V CABG tomorrow Roxan Hockey)  Objective:  Temp:  [98 F (36.7 C)-98.5 F (36.9 C)] 98.1 F (36.7 C) (01/15 0754) Pulse Rate:  [58-81] 62 (01/15 0600) Resp:  [11-22] 14 (01/15 0600) BP: (115-169)/(60-91) 149/89 mmHg (01/15 0600) SpO2:  [94 %-100 %] 99 % (01/15 0600) Weight:  [222 lb 3.6 oz (100.8 kg)] 222 lb 3.6 oz (100.8 kg) (01/15 0500) Weight change: 7.1 oz (0.2 kg)  Intake/Output from previous day: 01/14 0701 - 01/15 0700 In: 1590 [P.O.:1590] Out: -   Intake/Output from this shift:    Physical Exam: General appearance: alert and no distress Neck: no adenopathy, no carotid bruit, no JVD, supple, symmetrical, trachea midline and thyroid not enlarged, symmetric, no tenderness/mass/nodules Lungs: clear to auscultation bilaterally Heart: regular rate and rhythm, S1, S2 normal, no murmur, click, rub or gallop Extremities: extremities normal, atraumatic, no cyanosis or edema  Lab Results: No results found for this or any previous visit (from the past 48 hour(s)).  Imaging: Imaging results have been reviewed  Tele- NSR  Assessment/Plan:   1. Active Problems: 2.   Chest pain, exertional 3.   Chest pain 4.   Coronary artery disease due to lipid rich plaque 5.   Time Spent Directly with Patient:  15 minutes  Length of Stay:  LOS: 3 days   Stable this AM. Mild CP prob from anxiety. POD # 3 cath revealing 80% ostial LM with nl cors otherwise and nl LV fxn. On topical NTG and SQ hep for DVT prophylaxis. Enz neg. Labs OK. Exam OK. For CABG tomorrow with Dr Roxan Hockey (LIMA--->> LAD).   Quay Burow 11/01/2015, 8:14 AM

## 2015-11-01 NOTE — Progress Notes (Signed)
Pre-op Cardiac Surgery  Carotid Findings:  1-39% ICA plaquing.  Vertebral artery flow is antegrade.  Upper Extremity Right Left  Brachial Pressures 117T 132T  Radial Waveforms T T  Ulnar Waveforms T T  Palmar Arch (Allen's Test) Doppler signal remains normal with radial compression and obliterates with ulnar compression WNL   Findings:      Lower  Extremity Right Left  Dorsalis Pedis    Anterior Tibial T T  Posterior Tibial T T  Ankle/Brachial Indices      Findings:  Palpable and triphasic

## 2015-11-01 NOTE — Anesthesia Preprocedure Evaluation (Addendum)
Anesthesia Evaluation  Patient identified by MRN, date of birth, ID band Patient awake    Reviewed: Allergy & Precautions, NPO status , Patient's Chart, lab work & pertinent test results  Airway Mallampati: III  TM Distance: >3 FB Neck ROM: Full    Dental no notable dental hx.    Pulmonary asthma , Current Smoker,    Pulmonary exam normal breath sounds clear to auscultation       Cardiovascular hypertension, Pt. on medications + CAD  Normal cardiovascular exam Rhythm:Regular Rate:Normal  Cath 10/2015 80% osteal LM disease, LVSF 65%   Neuro/Psych PSYCHIATRIC DISORDERS Anxiety Depression negative neurological ROS     GI/Hepatic Neg liver ROS, GERD  ,  Endo/Other  negative endocrine ROS  Renal/GU negative Renal ROS     Musculoskeletal  (+) Arthritis ,   Abdominal   Peds  Hematology negative hematology ROS (+)   Anesthesia Other Findings   Reproductive/Obstetrics negative OB ROS                           Anesthesia Physical Anesthesia Plan  ASA: IV  Anesthesia Plan: General   Post-op Pain Management:    Induction: Intravenous  Airway Management Planned: Oral ETT  Additional Equipment: Arterial line, PA Cath, TEE, Ultrasound Guidance Line Placement and CVP  Intra-op Plan:   Post-operative Plan: Post-operative intubation/ventilation  Informed Consent: I have reviewed the patients History and Physical, chart, labs and discussed the procedure including the risks, benefits and alternatives for the proposed anesthesia with the patient or authorized representative who has indicated his/her understanding and acceptance.   Dental advisory given  Plan Discussed with: CRNA  Anesthesia Plan Comments:         Anesthesia Quick Evaluation

## 2015-11-02 ENCOUNTER — Inpatient Hospital Stay (HOSPITAL_COMMUNITY): Payer: Managed Care, Other (non HMO)

## 2015-11-02 ENCOUNTER — Other Ambulatory Visit (HOSPITAL_COMMUNITY): Payer: Self-pay

## 2015-11-02 ENCOUNTER — Inpatient Hospital Stay (HOSPITAL_COMMUNITY): Payer: Managed Care, Other (non HMO) | Admitting: Certified Registered"

## 2015-11-02 ENCOUNTER — Encounter (HOSPITAL_COMMUNITY)
Admission: EM | Disposition: A | Discharge status: Home / Self Care | Payer: BLUE CROSS/BLUE SHIELD | Source: Home / Self Care | Attending: Thoracic Surgery (Cardiothoracic Vascular Surgery)

## 2015-11-02 DIAGNOSIS — I25110 Atherosclerotic heart disease of native coronary artery with unstable angina pectoris: Secondary | ICD-10-CM

## 2015-11-02 DIAGNOSIS — I2510 Atherosclerotic heart disease of native coronary artery without angina pectoris: Secondary | ICD-10-CM | POA: Insufficient documentation

## 2015-11-02 DIAGNOSIS — I25119 Atherosclerotic heart disease of native coronary artery with unspecified angina pectoris: Secondary | ICD-10-CM | POA: Insufficient documentation

## 2015-11-02 DIAGNOSIS — Z951 Presence of aortocoronary bypass graft: Secondary | ICD-10-CM

## 2015-11-02 HISTORY — PX: TEE WITHOUT CARDIOVERSION: SHX5443

## 2015-11-02 HISTORY — PX: CORONARY ARTERY BYPASS GRAFT: SHX141

## 2015-11-02 LAB — POCT I-STAT 3, ART BLOOD GAS (G3+)
ACID-BASE DEFICIT: 3 mmol/L — AB (ref 0.0–2.0)
ACID-BASE DEFICIT: 1 mmol/L (ref 0.0–2.0)
ACID-BASE EXCESS: 3 mmol/L — AB (ref 0.0–2.0)
Acid-base deficit: 4 mmol/L — ABNORMAL HIGH (ref 0.0–2.0)
BICARBONATE: 26.9 meq/L — AB (ref 20.0–24.0)
BICARBONATE: 24.9 meq/L — AB (ref 20.0–24.0)
BICARBONATE: 22.6 meq/L (ref 20.0–24.0)
BICARBONATE: 24.4 meq/L — AB (ref 20.0–24.0)
BICARBONATE: 21.4 meq/L (ref 20.0–24.0)
O2 SAT: 100 %
O2 SAT: 98 %
O2 Saturation: 100 %
O2 Saturation: 92 %
O2 Saturation: 96 %
PCO2 ART: 41.5 mmHg (ref 35.0–45.0)
PCO2 ART: 39.8 mmHg (ref 35.0–45.0)
PH ART: 7.464 — AB (ref 7.350–7.450)
PH ART: 7.375 (ref 7.350–7.450)
TCO2: 28 mmol/L (ref 0–100)
TCO2: 26 mmol/L (ref 0–100)
TCO2: 24 mmol/L (ref 0–100)
TCO2: 26 mmol/L (ref 0–100)
TCO2: 23 mmol/L (ref 0–100)
pCO2 arterial: 37.5 mmHg (ref 35.0–45.0)
pCO2 arterial: 42 mmHg (ref 35.0–45.0)
pCO2 arterial: 37.6 mmHg (ref 35.0–45.0)
pH, Arterial: 7.382 (ref 7.350–7.450)
pH, Arterial: 7.379 (ref 7.350–7.450)
pH, Arterial: 7.335 — ABNORMAL LOW (ref 7.350–7.450)
pO2, Arterial: 375 mmHg — ABNORMAL HIGH (ref 80.0–100.0)
pO2, Arterial: 217 mmHg — ABNORMAL HIGH (ref 80.0–100.0)
pO2, Arterial: 61 mmHg — ABNORMAL LOW (ref 80.0–100.0)
pO2, Arterial: 98 mmHg (ref 80.0–100.0)
pO2, Arterial: 85 mmHg (ref 80.0–100.0)

## 2015-11-02 LAB — POCT I-STAT, CHEM 8
BUN: 10 mg/dL (ref 6–20)
BUN: 9 mg/dL (ref 6–20)
BUN: 8 mg/dL (ref 6–20)
BUN: 8 mg/dL (ref 6–20)
BUN: 9 mg/dL (ref 6–20)
CALCIUM ION: 1.16 mmol/L (ref 1.12–1.23)
CHLORIDE: 104 mmol/L (ref 101–111)
CREATININE: 0.8 mg/dL (ref 0.44–1.00)
CREATININE: 0.7 mg/dL (ref 0.44–1.00)
CREATININE: 0.8 mg/dL (ref 0.44–1.00)
CREATININE: 0.8 mg/dL (ref 0.44–1.00)
Calcium, Ion: 1.12 mmol/L (ref 1.12–1.23)
Calcium, Ion: 0.93 mmol/L — ABNORMAL LOW (ref 1.12–1.23)
Calcium, Ion: 1.06 mmol/L — ABNORMAL LOW (ref 1.12–1.23)
Calcium, Ion: 1.11 mmol/L — ABNORMAL LOW (ref 1.12–1.23)
Chloride: 103 mmol/L (ref 101–111)
Chloride: 99 mmol/L — ABNORMAL LOW (ref 101–111)
Chloride: 103 mmol/L (ref 101–111)
Chloride: 106 mmol/L (ref 101–111)
Creatinine, Ser: 0.6 mg/dL (ref 0.44–1.00)
GLUCOSE: 149 mg/dL — AB (ref 65–99)
GLUCOSE: 138 mg/dL — AB (ref 65–99)
GLUCOSE: 153 mg/dL — AB (ref 65–99)
Glucose, Bld: 159 mg/dL — ABNORMAL HIGH (ref 65–99)
Glucose, Bld: 139 mg/dL — ABNORMAL HIGH (ref 65–99)
HCT: 33 % — ABNORMAL LOW (ref 36.0–46.0)
HCT: 26 % — ABNORMAL LOW (ref 36.0–46.0)
HCT: 28 % — ABNORMAL LOW (ref 36.0–46.0)
HEMATOCRIT: 33 % — AB (ref 36.0–46.0)
HEMATOCRIT: 34 % — AB (ref 36.0–46.0)
HEMOGLOBIN: 11.2 g/dL — AB (ref 12.0–15.0)
HEMOGLOBIN: 8.8 g/dL — AB (ref 12.0–15.0)
HEMOGLOBIN: 9.5 g/dL — AB (ref 12.0–15.0)
HEMOGLOBIN: 11.6 g/dL — AB (ref 12.0–15.0)
Hemoglobin: 11.2 g/dL — ABNORMAL LOW (ref 12.0–15.0)
POTASSIUM: 4 mmol/L (ref 3.5–5.1)
POTASSIUM: 4.5 mmol/L (ref 3.5–5.1)
POTASSIUM: 4.3 mmol/L (ref 3.5–5.1)
POTASSIUM: 4 mmol/L (ref 3.5–5.1)
Potassium: 3.8 mmol/L (ref 3.5–5.1)
SODIUM: 138 mmol/L (ref 135–145)
Sodium: 139 mmol/L (ref 135–145)
Sodium: 136 mmol/L (ref 135–145)
Sodium: 137 mmol/L (ref 135–145)
Sodium: 138 mmol/L (ref 135–145)
TCO2: 28 mmol/L (ref 0–100)
TCO2: 28 mmol/L (ref 0–100)
TCO2: 27 mmol/L (ref 0–100)
TCO2: 24 mmol/L (ref 0–100)
TCO2: 21 mmol/L (ref 0–100)

## 2015-11-02 LAB — GLUCOSE, CAPILLARY
GLUCOSE-CAPILLARY: 114 mg/dL — AB (ref 65–99)
Glucose-Capillary: 150 mg/dL — ABNORMAL HIGH (ref 65–99)
Glucose-Capillary: 130 mg/dL — ABNORMAL HIGH (ref 65–99)
Glucose-Capillary: 114 mg/dL — ABNORMAL HIGH (ref 65–99)
Glucose-Capillary: 120 mg/dL — ABNORMAL HIGH (ref 65–99)
Glucose-Capillary: 121 mg/dL — ABNORMAL HIGH (ref 65–99)

## 2015-11-02 LAB — CBC
HCT: 30.6 % — ABNORMAL LOW (ref 36.0–46.0)
HCT: 32.7 % — ABNORMAL LOW (ref 36.0–46.0)
HEMATOCRIT: 34.9 % — AB (ref 36.0–46.0)
Hemoglobin: 10.6 g/dL — ABNORMAL LOW (ref 12.0–15.0)
Hemoglobin: 11.5 g/dL — ABNORMAL LOW (ref 12.0–15.0)
Hemoglobin: 9.9 g/dL — ABNORMAL LOW (ref 12.0–15.0)
MCH: 29.2 pg (ref 26.0–34.0)
MCH: 29.6 pg (ref 26.0–34.0)
MCH: 29.6 pg (ref 26.0–34.0)
MCHC: 32.4 g/dL (ref 30.0–36.0)
MCHC: 32.4 g/dL (ref 30.0–36.0)
MCHC: 33 g/dL (ref 30.0–36.0)
MCV: 89.9 fL (ref 78.0–100.0)
MCV: 90.3 fL (ref 78.0–100.0)
MCV: 91.3 fL (ref 78.0–100.0)
PLATELETS: 168 10*3/uL (ref 150–400)
PLATELETS: 254 10*3/uL (ref 150–400)
PLATELETS: 188 10*3/uL (ref 150–400)
RBC: 3.39 MIL/uL — ABNORMAL LOW (ref 3.87–5.11)
RBC: 3.58 MIL/uL — ABNORMAL LOW (ref 3.87–5.11)
RBC: 3.88 MIL/uL (ref 3.87–5.11)
RDW: 13.1 % (ref 11.5–15.5)
RDW: 13 % (ref 11.5–15.5)
RDW: 13 % (ref 11.5–15.5)
WBC: 11.9 10*3/uL — AB (ref 4.0–10.5)
WBC: 10 10*3/uL (ref 4.0–10.5)
WBC: 17.3 10*3/uL — AB (ref 4.0–10.5)

## 2015-11-02 LAB — BASIC METABOLIC PANEL
ANION GAP: 8 (ref 5–15)
BUN: 9 mg/dL (ref 6–20)
CALCIUM: 8.6 mg/dL — AB (ref 8.9–10.3)
CO2: 24 mmol/L (ref 22–32)
Chloride: 106 mmol/L (ref 101–111)
Creatinine, Ser: 0.9 mg/dL (ref 0.44–1.00)
Glucose, Bld: 136 mg/dL — ABNORMAL HIGH (ref 65–99)
Potassium: 3.7 mmol/L (ref 3.5–5.1)
Sodium: 138 mmol/L (ref 135–145)

## 2015-11-02 LAB — POCT I-STAT 4, (NA,K, GLUC, HGB,HCT)
Glucose, Bld: 135 mg/dL — ABNORMAL HIGH (ref 65–99)
HCT: 31 % — ABNORMAL LOW (ref 36.0–46.0)
Hemoglobin: 10.5 g/dL — ABNORMAL LOW (ref 12.0–15.0)
Potassium: 4 mmol/L (ref 3.5–5.1)
Sodium: 138 mmol/L (ref 135–145)

## 2015-11-02 LAB — CREATININE, SERUM: Creatinine, Ser: 0.86 mg/dL (ref 0.44–1.00)

## 2015-11-02 LAB — PROTIME-INR
INR: 1.35 (ref 0.00–1.49)
PROTHROMBIN TIME: 16.8 s — AB (ref 11.6–15.2)

## 2015-11-02 LAB — HEMOGLOBIN AND HEMATOCRIT, BLOOD
HEMATOCRIT: 25.6 % — AB (ref 36.0–46.0)
Hemoglobin: 8.5 g/dL — ABNORMAL LOW (ref 12.0–15.0)

## 2015-11-02 LAB — PLATELET COUNT: Platelets: 252 10*3/uL (ref 150–400)

## 2015-11-02 LAB — MAGNESIUM: Magnesium: 2.7 mg/dL — ABNORMAL HIGH (ref 1.7–2.4)

## 2015-11-02 LAB — APTT: APTT: 30 s (ref 24–37)

## 2015-11-02 SURGERY — CORONARY ARTERY BYPASS GRAFTING (CABG)
Anesthesia: General | Site: Chest

## 2015-11-02 MED ORDER — METOPROLOL TARTRATE 1 MG/ML IV SOLN: 2.5000 mg | INTRAVENOUS | Status: DC | PRN

## 2015-11-02 MED ORDER — PHENYLEPHRINE 40 MCG/ML (10ML) SYRINGE FOR IV PUSH (FOR BLOOD PRESSURE SUPPORT)
PREFILLED_SYRINGE | INTRAVENOUS | Status: AC
  Filled 2015-11-02: qty 10

## 2015-11-02 MED ORDER — SODIUM CHLORIDE 0.9 % IV SOLN
250.0000 mL | INTRAVENOUS | Status: DC
  Administered 2015-11-03: 250 mL via INTRAVENOUS

## 2015-11-02 MED ORDER — ASPIRIN EC 325 MG PO TBEC
325.0000 mg | DELAYED_RELEASE_TABLET | Freq: Every day | ORAL | Status: DC
  Administered 2015-11-03 – 2015-11-06 (×4): 325 mg via ORAL
  Filled 2015-11-02 (×4): qty 1

## 2015-11-02 MED ORDER — ASPIRIN 81 MG PO CHEW: 324.0000 mg | CHEWABLE_TABLET | Freq: Every day | ORAL | Status: DC

## 2015-11-02 MED ORDER — METOCLOPRAMIDE HCL 5 MG/ML IJ SOLN
10.0000 mg | Freq: Four times a day (QID) | INTRAMUSCULAR | Status: AC
  Administered 2015-11-02 – 2015-11-03 (×3): 10 mg via INTRAVENOUS
  Filled 2015-11-02 (×2): qty 2

## 2015-11-02 MED ORDER — SODIUM CHLORIDE 0.9 % IJ SOLN
INTRAMUSCULAR | Status: AC
  Filled 2015-11-02: qty 10

## 2015-11-02 MED ORDER — ACETAMINOPHEN 160 MG/5ML PO SOLN: 650.0000 mg | Freq: Once | ORAL | Status: AC

## 2015-11-02 MED ORDER — ROCURONIUM BROMIDE 50 MG/5ML IV SOLN
INTRAVENOUS | Status: AC
  Filled 2015-11-02: qty 2

## 2015-11-02 MED ORDER — ACETAMINOPHEN 650 MG RE SUPP
650.0000 mg | Freq: Once | RECTAL | Status: AC
  Administered 2015-11-02: 650 mg via RECTAL

## 2015-11-02 MED ORDER — SODIUM CHLORIDE 0.9 % IV SOLN
INTRAVENOUS | Status: DC
  Administered 2015-11-02: 12:00:00 via INTRAVENOUS

## 2015-11-02 MED ORDER — PROPOFOL 10 MG/ML IV BOLUS
INTRAVENOUS | Status: AC
  Filled 2015-11-02: qty 20

## 2015-11-02 MED ORDER — LACTATED RINGERS IV SOLN
INTRAVENOUS | Status: DC | PRN
  Administered 2015-11-02 (×2): via INTRAVENOUS

## 2015-11-02 MED ORDER — LACTATED RINGERS IV SOLN: INTRAVENOUS | Status: DC

## 2015-11-02 MED ORDER — SODIUM CHLORIDE 0.9 % IJ SOLN
3.0000 mL | INTRAMUSCULAR | Status: DC | PRN
  Administered 2015-11-03: 3 mL via INTRAVENOUS
  Filled 2015-11-02: qty 3

## 2015-11-02 MED ORDER — ACETAMINOPHEN 500 MG PO TABS
1000.0000 mg | ORAL_TABLET | Freq: Four times a day (QID) | ORAL | Status: DC
  Administered 2015-11-03 – 2015-11-05 (×11): 1000 mg via ORAL
  Filled 2015-11-02 (×11): qty 2

## 2015-11-02 MED ORDER — OXYCODONE HCL 5 MG PO TABS
5.0000 mg | ORAL_TABLET | ORAL | Status: DC | PRN
  Administered 2015-11-03 – 2015-11-05 (×12): 10 mg via ORAL
  Filled 2015-11-02 (×12): qty 2

## 2015-11-02 MED ORDER — ONDANSETRON HCL 4 MG/2ML IJ SOLN
4.0000 mg | Freq: Four times a day (QID) | INTRAMUSCULAR | Status: DC | PRN
  Administered 2015-11-04: 4 mg via INTRAVENOUS
  Filled 2015-11-02: qty 2

## 2015-11-02 MED ORDER — BISACODYL 5 MG PO TBEC
10.0000 mg | DELAYED_RELEASE_TABLET | Freq: Every day | ORAL | Status: DC
  Administered 2015-11-03 – 2015-11-05 (×3): 10 mg via ORAL
  Filled 2015-11-02 (×3): qty 2

## 2015-11-02 MED ORDER — SODIUM CHLORIDE 0.9 % IV SOLN
INTRAVENOUS | Status: DC
  Administered 2015-11-03: 01:00:00 via INTRAVENOUS
  Filled 2015-11-02: qty 2.5

## 2015-11-02 MED ORDER — POTASSIUM CHLORIDE 10 MEQ/50ML IV SOLN: 10.0000 meq | INTRAVENOUS | Status: AC

## 2015-11-02 MED ORDER — CHLORHEXIDINE GLUCONATE 0.12 % MT SOLN: 15.0000 mL | Freq: Two times a day (BID) | OROMUCOSAL | Status: DC

## 2015-11-02 MED ORDER — MORPHINE SULFATE (PF) 2 MG/ML IV SOLN
2.0000 mg | INTRAVENOUS | Status: DC | PRN
  Administered 2015-11-02 (×2): 2 mg via INTRAVENOUS
  Administered 2015-11-02: 4 mg via INTRAVENOUS
  Administered 2015-11-02 – 2015-11-03 (×2): 2 mg via INTRAVENOUS
  Administered 2015-11-03: 4 mg via INTRAVENOUS
  Administered 2015-11-03 (×2): 2 mg via INTRAVENOUS
  Filled 2015-11-02: qty 2
  Filled 2015-11-02 (×5): qty 1
  Filled 2015-11-02: qty 2
  Filled 2015-11-02: qty 1

## 2015-11-02 MED ORDER — DOCUSATE SODIUM 100 MG PO CAPS
200.0000 mg | ORAL_CAPSULE | Freq: Every day | ORAL | Status: DC
  Administered 2015-11-03 – 2015-11-05 (×3): 200 mg via ORAL
  Filled 2015-11-02 (×3): qty 2

## 2015-11-02 MED ORDER — METOPROLOL TARTRATE 25 MG/10 ML ORAL SUSPENSION: 12.5000 mg | Freq: Two times a day (BID) | ORAL | Status: DC

## 2015-11-02 MED ORDER — TRAMADOL HCL 50 MG PO TABS
50.0000 mg | ORAL_TABLET | ORAL | Status: DC | PRN
  Administered 2015-11-03 – 2015-11-04 (×2): 100 mg via ORAL
  Administered 2015-11-06: 50 mg via ORAL
  Administered 2015-11-06: 100 mg via ORAL
  Filled 2015-11-02: qty 2
  Filled 2015-11-02: qty 1
  Filled 2015-11-02 (×2): qty 2

## 2015-11-02 MED ORDER — LACTATED RINGERS IV SOLN: 500.0000 mL | Freq: Once | INTRAVENOUS | Status: DC | PRN

## 2015-11-02 MED ORDER — HEMOSTATIC AGENTS (NO CHARGE) OPTIME
TOPICAL | Status: DC | PRN
  Administered 2015-11-02: 1 via TOPICAL

## 2015-11-02 MED ORDER — PROTAMINE SULFATE 10 MG/ML IV SOLN
INTRAVENOUS | Status: DC | PRN
  Administered 2015-11-02: 270 mg via INTRAVENOUS
  Administered 2015-11-02: 30 mg via INTRAVENOUS

## 2015-11-02 MED ORDER — MIDAZOLAM HCL 2 MG/2ML IJ SOLN: 2.0000 mg | INTRAMUSCULAR | Status: DC | PRN

## 2015-11-02 MED ORDER — SODIUM CHLORIDE 0.45 % IV SOLN
INTRAVENOUS | Status: DC | PRN
  Administered 2015-11-02: 12:00:00 via INTRAVENOUS

## 2015-11-02 MED ORDER — GELATIN ABSORBABLE MT POWD
OROMUCOSAL | Status: DC | PRN
  Administered 2015-11-02: 4 mL via TOPICAL

## 2015-11-02 MED ORDER — HEPARIN SODIUM (PORCINE) 1000 UNIT/ML IJ SOLN
INTRAMUSCULAR | Status: DC | PRN
  Administered 2015-11-02: 17000 [IU] via INTRAVENOUS
  Administered 2015-11-02: 13000 [IU] via INTRAVENOUS

## 2015-11-02 MED ORDER — MAGNESIUM SULFATE 4 GM/100ML IV SOLN
4.0000 g | Freq: Once | INTRAVENOUS | Status: AC
  Administered 2015-11-02: 4 g via INTRAVENOUS
  Filled 2015-11-02: qty 100

## 2015-11-02 MED ORDER — FAMOTIDINE IN NACL 20-0.9 MG/50ML-% IV SOLN
20.0000 mg | Freq: Two times a day (BID) | INTRAVENOUS | Status: AC
  Administered 2015-11-02: 20 mg via INTRAVENOUS

## 2015-11-02 MED ORDER — CHLORHEXIDINE GLUCONATE 0.12 % MT SOLN
15.0000 mL | OROMUCOSAL | Status: AC
  Administered 2015-11-02: 15 mL via OROMUCOSAL

## 2015-11-02 MED ORDER — LACTATED RINGERS IV SOLN
INTRAVENOUS | Status: DC | PRN
  Administered 2015-11-02: 08:00:00 via INTRAVENOUS

## 2015-11-02 MED ORDER — ONDANSETRON HCL 4 MG/2ML IJ SOLN
INTRAMUSCULAR | Status: DC | PRN
  Administered 2015-11-02: 4 mg via INTRAVENOUS

## 2015-11-02 MED ORDER — LIDOCAINE HCL (CARDIAC) 20 MG/ML IV SOLN
INTRAVENOUS | Status: AC
  Filled 2015-11-02: qty 5

## 2015-11-02 MED ORDER — ESMOLOL HCL 100 MG/10ML IV SOLN
INTRAVENOUS | Status: AC
  Filled 2015-11-02: qty 10

## 2015-11-02 MED ORDER — ACETAMINOPHEN 160 MG/5ML PO SOLN: 1000.0000 mg | Freq: Four times a day (QID) | ORAL | Status: DC

## 2015-11-02 MED ORDER — FENTANYL CITRATE (PF) 250 MCG/5ML IJ SOLN
INTRAMUSCULAR | Status: AC
  Filled 2015-11-02: qty 10

## 2015-11-02 MED ORDER — PROTAMINE SULFATE 10 MG/ML IV SOLN
INTRAVENOUS | Status: AC
  Filled 2015-11-02: qty 25

## 2015-11-02 MED ORDER — ROCURONIUM BROMIDE 100 MG/10ML IV SOLN
INTRAVENOUS | Status: DC | PRN
  Administered 2015-11-02: 60 mg via INTRAVENOUS
  Administered 2015-11-02: 40 mg via INTRAVENOUS
  Administered 2015-11-02: 50 mg via INTRAVENOUS

## 2015-11-02 MED ORDER — ALBUMIN HUMAN 5 % IV SOLN
250.0000 mL | INTRAVENOUS | Status: AC | PRN
  Administered 2015-11-02: 250 mL via INTRAVENOUS

## 2015-11-02 MED ORDER — SODIUM CHLORIDE 0.9 % IJ SOLN
INTRAMUSCULAR | Status: DC | PRN
  Administered 2015-11-02: 4 mL via TOPICAL

## 2015-11-02 MED ORDER — 0.9 % SODIUM CHLORIDE (POUR BTL) OPTIME
TOPICAL | Status: DC | PRN
  Administered 2015-11-02: 1000 mL

## 2015-11-02 MED ORDER — DEXMEDETOMIDINE HCL IN NACL 400 MCG/100ML IV SOLN
0.4000 ug/kg/h | INTRAVENOUS | Status: DC
  Filled 2015-11-02: qty 100

## 2015-11-02 MED ORDER — INSULIN REGULAR BOLUS VIA INFUSION
0.0000 [IU] | Freq: Three times a day (TID) | INTRAVENOUS | Status: DC
  Filled 2015-11-02: qty 10

## 2015-11-02 MED ORDER — MIDAZOLAM HCL 10 MG/2ML IJ SOLN
INTRAMUSCULAR | Status: AC
  Filled 2015-11-02: qty 2

## 2015-11-02 MED ORDER — PHENYLEPHRINE HCL 10 MG/ML IJ SOLN: 0.0000 ug/min | INTRAVENOUS | Status: DC

## 2015-11-02 MED ORDER — ANTISEPTIC ORAL RINSE SOLUTION (CORINZ)
7.0000 mL | Freq: Four times a day (QID) | OROMUCOSAL | Status: DC
Start: 2015-11-02 — End: 2015-11-03
  Administered 2015-11-02 – 2015-11-03 (×3): 7 mL via OROMUCOSAL

## 2015-11-02 MED ORDER — FENTANYL CITRATE (PF) 250 MCG/5ML IJ SOLN
INTRAMUSCULAR | Status: AC
  Filled 2015-11-02: qty 30

## 2015-11-02 MED ORDER — ARTIFICIAL TEARS OP OINT
TOPICAL_OINTMENT | OPHTHALMIC | Status: AC
  Filled 2015-11-02: qty 3.5

## 2015-11-02 MED ORDER — LACTATED RINGERS IV SOLN
INTRAVENOUS | Status: DC | PRN
  Administered 2015-11-02 (×2): via INTRAVENOUS

## 2015-11-02 MED ORDER — PROPOFOL 10 MG/ML IV BOLUS
INTRAVENOUS | Status: DC | PRN
  Administered 2015-11-02: 90 mg via INTRAVENOUS

## 2015-11-02 MED ORDER — MIDAZOLAM HCL 5 MG/5ML IJ SOLN
INTRAMUSCULAR | Status: DC | PRN
  Administered 2015-11-02 (×2): 3 mg via INTRAVENOUS

## 2015-11-02 MED ORDER — MORPHINE SULFATE (PF) 2 MG/ML IV SOLN: 1.0000 mg | INTRAVENOUS | Status: AC | PRN

## 2015-11-02 MED ORDER — CHLORHEXIDINE GLUCONATE 0.12% ORAL RINSE (MEDLINE KIT)
15.0000 mL | Freq: Two times a day (BID) | OROMUCOSAL | Status: DC
Start: 2015-11-02 — End: 2015-11-03
  Administered 2015-11-02: 15 mL via OROMUCOSAL

## 2015-11-02 MED ORDER — HEPARIN SODIUM (PORCINE) 1000 UNIT/ML IJ SOLN
INTRAMUSCULAR | Status: AC
  Filled 2015-11-02: qty 1

## 2015-11-02 MED ORDER — PANTOPRAZOLE SODIUM 40 MG PO TBEC
40.0000 mg | DELAYED_RELEASE_TABLET | Freq: Every day | ORAL | Status: DC
  Administered 2015-11-04 – 2015-11-06 (×3): 40 mg via ORAL
  Filled 2015-11-02 (×3): qty 1

## 2015-11-02 MED ORDER — ANTISEPTIC ORAL RINSE SOLUTION (CORINZ): 7.0000 mL | OROMUCOSAL | Status: DC

## 2015-11-02 MED ORDER — PROTAMINE SULFATE 10 MG/ML IV SOLN
INTRAVENOUS | Status: AC
  Filled 2015-11-02: qty 5

## 2015-11-02 MED ORDER — VANCOMYCIN HCL IN DEXTROSE 1-5 GM/200ML-% IV SOLN
1000.0000 mg | Freq: Once | INTRAVENOUS | Status: AC
  Administered 2015-11-02: 1000 mg via INTRAVENOUS
  Filled 2015-11-02: qty 200

## 2015-11-02 MED ORDER — DEXMEDETOMIDINE HCL IN NACL 200 MCG/50ML IV SOLN: 0.0000 ug/kg/h | INTRAVENOUS | Status: DC

## 2015-11-02 MED ORDER — METOPROLOL TARTRATE 12.5 MG HALF TABLET
12.5000 mg | ORAL_TABLET | Freq: Two times a day (BID) | ORAL | Status: DC
Start: 2015-11-02 — End: 2015-11-03

## 2015-11-02 MED ORDER — LEVOFLOXACIN IN D5W 750 MG/150ML IV SOLN
750.0000 mg | INTRAVENOUS | Status: AC
  Administered 2015-11-03: 750 mg via INTRAVENOUS
  Filled 2015-11-02: qty 150

## 2015-11-02 MED ORDER — PHENYLEPHRINE HCL 10 MG/ML IJ SOLN
INTRAMUSCULAR | Status: DC | PRN
  Administered 2015-11-02: 80 ug via INTRAVENOUS

## 2015-11-02 MED ORDER — BISACODYL 10 MG RE SUPP: 10.0000 mg | Freq: Every day | RECTAL | Status: DC

## 2015-11-02 MED ORDER — FENTANYL CITRATE (PF) 100 MCG/2ML IJ SOLN
INTRAMUSCULAR | Status: DC | PRN
  Administered 2015-11-02: 350 ug via INTRAVENOUS
  Administered 2015-11-02: 500 ug via INTRAVENOUS
  Administered 2015-11-02 (×4): 250 ug via INTRAVENOUS
  Administered 2015-11-02: 50 ug via INTRAVENOUS
  Administered 2015-11-02: 100 ug via INTRAVENOUS

## 2015-11-02 MED ORDER — SODIUM CHLORIDE 0.9 % IJ SOLN
3.0000 mL | Freq: Two times a day (BID) | INTRAMUSCULAR | Status: DC
  Administered 2015-11-03: 10 mL via INTRAVENOUS
  Administered 2015-11-04: 3 mL via INTRAVENOUS

## 2015-11-02 MED ORDER — NITROGLYCERIN IN D5W 200-5 MCG/ML-% IV SOLN: 0.0000 ug/min | INTRAVENOUS | Status: DC

## 2015-11-02 MED FILL — Sodium Chloride IV Soln 0.9%: INTRAVENOUS | Qty: 2000 | Status: AC

## 2015-11-02 MED FILL — Potassium Chloride Inj 2 mEq/ML: INTRAVENOUS | Qty: 40 | Status: AC

## 2015-11-02 MED FILL — Electrolyte-R (PH 7.4) Solution: INTRAVENOUS | Qty: 3000 | Status: AC

## 2015-11-02 MED FILL — Heparin Sodium (Porcine) Inj 1000 Unit/ML: INTRAMUSCULAR | Qty: 30 | Status: AC

## 2015-11-02 MED FILL — Mannitol IV Soln 20%: INTRAVENOUS | Qty: 500 | Status: AC

## 2015-11-02 MED FILL — Lidocaine HCl IV Inj 20 MG/ML: INTRAVENOUS | Qty: 5 | Status: AC

## 2015-11-02 MED FILL — Heparin Sodium (Porcine) Inj 1000 Unit/ML: INTRAMUSCULAR | Qty: 10 | Status: AC

## 2015-11-02 MED FILL — Magnesium Sulfate Inj 50%: INTRAMUSCULAR | Qty: 10 | Status: AC

## 2015-11-02 MED FILL — Sodium Bicarbonate IV Soln 8.4%: INTRAVENOUS | Qty: 50 | Status: AC

## 2015-11-02 SURGICAL SUPPLY — 88 items
BAG DECANTER FOR FLEXI CONT (MISCELLANEOUS) ×3 IMPLANT
BANDAGE ELASTIC 4 VELCRO ST LF (GAUZE/BANDAGES/DRESSINGS) ×3 IMPLANT
BANDAGE ELASTIC 6 VELCRO ST LF (GAUZE/BANDAGES/DRESSINGS) ×3 IMPLANT
BASKET HEART (ORDER IN 25'S) (MISCELLANEOUS) ×1
BASKET HEART (ORDER IN 25S) (MISCELLANEOUS) ×2 IMPLANT
BLADE STERNUM SYSTEM 6 (BLADE) ×3 IMPLANT
BNDG GAUZE ELAST 4 BULKY (GAUZE/BANDAGES/DRESSINGS) ×3 IMPLANT
CANISTER SUCTION 2500CC (MISCELLANEOUS) ×3 IMPLANT
CANNULA EZ GLIDE AORTIC 21FR (CANNULA) ×3 IMPLANT
CATH CPB KIT HENDRICKSON (MISCELLANEOUS) ×3 IMPLANT
CATH ROBINSON RED A/P 18FR (CATHETERS) ×3 IMPLANT
CATH THORACIC 36FR (CATHETERS) ×3 IMPLANT
CATH THORACIC 36FR RT ANG (CATHETERS) ×3 IMPLANT
CLIP TI MEDIUM 24 (CLIP) IMPLANT
CLIP TI WIDE RED SMALL 24 (CLIP) IMPLANT
CRADLE DONUT ADULT HEAD (MISCELLANEOUS) ×3 IMPLANT
DRAPE CARDIOVASCULAR INCISE (DRAPES) ×1
DRAPE SLUSH/WARMER DISC (DRAPES) ×3 IMPLANT
DRAPE SRG 135X102X78XABS (DRAPES) ×2 IMPLANT
DRSG COVADERM 4X14 (GAUZE/BANDAGES/DRESSINGS) ×3 IMPLANT
ELECT REM PT RETURN 9FT ADLT (ELECTROSURGICAL) ×6
ELECTRODE REM PT RTRN 9FT ADLT (ELECTROSURGICAL) ×4 IMPLANT
GAUZE SPONGE 4X4 12PLY STRL (GAUZE/BANDAGES/DRESSINGS) ×6 IMPLANT
GLOVE SURG SIGNA 7.5 PF LTX (GLOVE) ×9 IMPLANT
GOWN STRL REUS W/ TWL LRG LVL3 (GOWN DISPOSABLE) ×8 IMPLANT
GOWN STRL REUS W/ TWL XL LVL3 (GOWN DISPOSABLE) ×4 IMPLANT
GOWN STRL REUS W/TWL LRG LVL3 (GOWN DISPOSABLE) ×4
GOWN STRL REUS W/TWL XL LVL3 (GOWN DISPOSABLE) ×2
HEMOSTAT POWDER SURGIFOAM 1G (HEMOSTASIS) ×9 IMPLANT
HEMOSTAT SURGICEL 2X14 (HEMOSTASIS) ×3 IMPLANT
INSERT FOGARTY XLG (MISCELLANEOUS) IMPLANT
KIT BASIN OR (CUSTOM PROCEDURE TRAY) ×3 IMPLANT
KIT ROOM TURNOVER OR (KITS) ×3 IMPLANT
KIT SUCTION CATH 14FR (SUCTIONS) ×6 IMPLANT
KIT VASOVIEW W/TROCAR VH 2000 (KITS) ×3 IMPLANT
MARKER GRAFT CORONARY BYPASS (MISCELLANEOUS) ×9 IMPLANT
NS IRRIG 1000ML POUR BTL (IV SOLUTION) ×15 IMPLANT
PACK OPEN HEART (CUSTOM PROCEDURE TRAY) ×3 IMPLANT
PAD ARMBOARD 7.5X6 YLW CONV (MISCELLANEOUS) ×6 IMPLANT
PAD ELECT DEFIB RADIOL ZOLL (MISCELLANEOUS) ×3 IMPLANT
PENCIL BUTTON HOLSTER BLD 10FT (ELECTRODE) ×3 IMPLANT
PUNCH AORTIC ROTATE 4.0MM (MISCELLANEOUS) IMPLANT
PUNCH AORTIC ROTATE 4.5MM 8IN (MISCELLANEOUS) IMPLANT
PUNCH AORTIC ROTATE 5MM 8IN (MISCELLANEOUS) IMPLANT
SET CARDIOPLEGIA MPS 5001102 (MISCELLANEOUS) ×3 IMPLANT
SPONGE GAUZE 4X4 12PLY STER LF (GAUZE/BANDAGES/DRESSINGS) ×3 IMPLANT
SUT BONE WAX W31G (SUTURE) ×3 IMPLANT
SUT ETHIBOND 2 0 SH (SUTURE) ×1
SUT ETHIBOND 2 0 SH 36X2 (SUTURE) ×2 IMPLANT
SUT MNCRL AB 4-0 PS2 18 (SUTURE) IMPLANT
SUT PROLENE 3 0 SH DA (SUTURE) ×3 IMPLANT
SUT PROLENE 4 0 RB 1 (SUTURE) ×1
SUT PROLENE 4 0 SH DA (SUTURE) IMPLANT
SUT PROLENE 4-0 RB1 .5 CRCL 36 (SUTURE) ×2 IMPLANT
SUT PROLENE 5 0 C 1 36 (SUTURE) ×3 IMPLANT
SUT PROLENE 6 0 C 1 30 (SUTURE) ×9 IMPLANT
SUT PROLENE 7 0 BV1 MDA (SUTURE) ×3 IMPLANT
SUT PROLENE 8 0 BV175 6 (SUTURE) ×3 IMPLANT
SUT SILK  1 MH (SUTURE) ×1
SUT SILK 1 MH (SUTURE) ×2 IMPLANT
SUT SILK 1 TIES 10X30 (SUTURE) ×3 IMPLANT
SUT SILK 2 0 TIES 10X30 (SUTURE) ×3 IMPLANT
SUT SILK 2 0 TIES 17X18 (SUTURE) ×1
SUT SILK 2-0 18XBRD TIE BLK (SUTURE) ×2 IMPLANT
SUT SILK 3 0 SH CR/8 (SUTURE) ×6 IMPLANT
SUT SILK 4 0 TIE 10X30 (SUTURE) ×3 IMPLANT
SUT STEEL 6MS V (SUTURE) ×3 IMPLANT
SUT STEEL STERNAL CCS#1 18IN (SUTURE) IMPLANT
SUT STEEL SZ 6 DBL 3X14 BALL (SUTURE) ×3 IMPLANT
SUT VIC AB 1 CTX 36 (SUTURE) ×2
SUT VIC AB 1 CTX36XBRD ANBCTR (SUTURE) ×4 IMPLANT
SUT VIC AB 2-0 CT1 27 (SUTURE)
SUT VIC AB 2-0 CT1 TAPERPNT 27 (SUTURE) IMPLANT
SUT VIC AB 2-0 CTX 27 (SUTURE) IMPLANT
SUT VIC AB 3-0 SH 27 (SUTURE)
SUT VIC AB 3-0 SH 27X BRD (SUTURE) IMPLANT
SUT VIC AB 3-0 X1 27 (SUTURE) IMPLANT
SUT VICRYL 4-0 PS2 18IN ABS (SUTURE) IMPLANT
SUTURE E-PAK OPEN HEART (SUTURE) ×3 IMPLANT
SYSTEM SAHARA CHEST DRAIN ATS (WOUND CARE) ×3 IMPLANT
TAPE CLOTH SURG 4X10 WHT LF (GAUZE/BANDAGES/DRESSINGS) ×3 IMPLANT
TOWEL OR 17X24 6PK STRL BLUE (TOWEL DISPOSABLE) ×6 IMPLANT
TOWEL OR 17X26 10 PK STRL BLUE (TOWEL DISPOSABLE) ×6 IMPLANT
TRAY FOLEY IC TEMP SENS 16FR (CATHETERS) ×3 IMPLANT
TUBE FEEDING 8FR 16IN STR KANG (MISCELLANEOUS) ×3 IMPLANT
TUBING INSUFFLATION (TUBING) ×3 IMPLANT
UNDERPAD 30X30 INCONTINENT (UNDERPADS AND DIAPERS) ×3 IMPLANT
WATER STERILE IRR 1000ML POUR (IV SOLUTION) ×6 IMPLANT

## 2015-11-02 NOTE — Op Note (Signed)
NAMEPESSY, Brenda Cruz NO.:  [id number]  MEDICAL RECORD NO.:  [id number]  LOCATION:  2S06C                        FACILITY:  [facility]  PHYSICIAN:  Revonda Standard. Roxan Hockey, M.D.DATE OF BIRTH:  1966-05-17  DATE OF PROCEDURE:  11/02/2015 DATE OF DISCHARGE:                              OPERATIVE REPORT   PREOPERATIVE DIAGNOSIS:  Left main coronary artery disease.  POSTOPERATIVE DIAGNOSIS:  Left main coronary artery disease.  PROCEDURE:   Median sternotomy, extracorporeal circulation Coronary artery bypass grafting x1   Left internal mammary artery to LAD.  SURGEON:  Revonda Standard. Roxan Hockey, M.D.  ASSISTANTEllwood Handler, PA.  ANESTHESIA:  General.  FINDINGS:  LAD good quality, but deeply intramyocardial.  Left mammary good quality.  LAD grafted relatively proximally. Transesophageal echocardiography revealed preserved left ventricular wall motion, with no significant valvular pathology.  CLINICAL NOTE:  Brenda Cruz is a 50 year old woman, with a strong family history of coronary artery disease and multiple other cardiac risk factors.  She presented following an unstable anginal episode.  She ruled out for myocardial infarction, but at catheterization was found to have an ostial left main stenosis.  She was referred for coronary artery bypass grafting.  The indications, risks, benefits, and alternatives were discussed in detail with the patient.  She understood and accepted the risks and agreed to proceed.  OPERATIVE NOTE:  Brenda Cruz was brought to the preoperative holding area on November 02, 2015, there the anesthesia service, under the direction of Dr. Lissa Hoard, placed a Swan-Ganz catheter and an arterial blood pressure monitoring line.  She was taken to the operating room, anesthetized, and intubated.  Intravenous antibiotics were administered. A Foley catheter was placed.  Transesophageal echocardiography was performed.  It revealed preserved left  ventricular wall motion with no valvular pathology.  The chest, abdomen, and legs were prepped and draped in usual sterile fashion.  A median sternotomy was performed. The Rultract retractor was placed.  The left internal mammary artery was harvested under direct vision.  It was a good quality vessel. One-half of the full heparin dose was given prior to dividing the distal end of the mammary artery.  There was excellent flow through the cut end of the vessel.  A sternal retractor was placed and gently opened over time.  The pericardium was incised.  Heart was elevated and the LAD was not apparent on the epicardial surface of the heart.  The decision was made to perform on-pump grafting.  The remainder of full heparin dose was given. After confirming adequate anticoagulation with ACT measurement. The aorta was cannulated via concentric 2-0 Ethibond pledgeted pursestring sutures.  A dual-stage venous cannula was placed via a pursestring suture in the right atrial appendage.  Cardiopulmonary bypass was initiated. Flows were maintained per protocol.  The patient was maintained at normothermia due to the short anticipated pump run.  The heart was elevated.  The LAD was dissected out proximally.  It was deeply intramyocardial, but was a good quality vessel at the site.  The left internal mammary artery was brought through a window in the pericardium. It was cut to length.  The distal end was beveled.  It was a 2 mm  good quality conduit.  A temperature probe was placed in the myocardial septum.  A cardioplegia cannula was placed in the ascending aorta.  The aorta was crossclamped. Cardiac arrest then was achieved with a combination of cold antegrade blood cardioplegia and topical iced saline.  There was a rapid diastolic arrest, but it took 1.5 L of cold antegrade cardioplegia to reach a septal temperature of 10 degrees Celsius.  The heart was again elevated.  The LAD was again exposed.   An arteriotomy was made and lengthened proximally and distally.  The LAD was a good quality target.  The left mammary was anastomosed end-to-side to the LAD with a running 8-0 Prolene suture.  At the completion of the mammary to LAD anastomosis, the bull-dog clamp was removed.  Rapid septal rewarming was noted.  There was good hemostasis at the anastomosis.  The mammary pedicle was tacked to the epicardial surface of the heart with 6-0 Prolene sutures.  The heart was allowed to rest back in its normal position.  The patient was placed in Trendelenburg position.  Lidocaine was administered.  The aortic crossclamp was removed.  Total crossclamp time was 27 minutes.  The patient did require a single defibrillation with 10 joules and then was in sinus rhythm thereafter.  While rewarming was completed, the anastomosis was inspected for hemostasis.  There was some bleeding from the myocardial dissection, but none from the anastomosis itself.  Epicardial pacing wires were placed on the right ventricle and right atrium.  The patient then was weaned from cardiopulmonary bypass without difficulty, requiring no inotropic support.  Total bypass time was 53 minutes.  Postbypass transesophageal echocardiography was unchanged from the prebypass study.  The initial cardiac index was greater than 2 L/min/m2.  She remained hemodynamically stable throughout the post bypass period.  A test dose of protamine was administered and was well tolerated.  The right atrial and aortic cardioplegia cannulas were removed.  The aortic cannula was removed as well.  The remainder of the Protamine was administered without incident.  The chest irrigated with warm saline. Hemostasis was achieved.  The pericardium was not reapproximated.  A left pleural and mediastinal chest tubes were placed via separate subcostal incisions.  The sternum was closed with a combination of single and double heavy gauge stainless steel wires,  the pectoralis fascia, subcutaneous tissue, and skin were closed in standard fashion. All sponge, needle, and instrument counts were correct at the end of the procedure.  The patient was taken from the operating room to the Surgical Intensive Care Unit intubated and in good condition.     Revonda Standard Roxan Hockey, M.D.     SCH/MEDQ  D:  11/02/2015  T:  11/02/2015  Job:  [id number]

## 2015-11-02 NOTE — H&P (View-Only) (Signed)
Reason for Consult:Left main coronary artery disease Referring Physician: Dr. Gwenlyn Found Primary Dr. Aris Lot is an 50 y.o. female.  HPI: 50 yo woman admitted with a cc/o CP  Mrs. Bucker is a 50 yo woman with a past history of tobacco abuse, reflux, recently diagnosed hypertension and hyperlipidemia, borderline diabetes, obesity and a strong family history of CAD. She has no prior history of cardiac disease.Over the past several months she has been having chest pain with exertion. She thought it might be anxiety or her reflux. She saw her MD at the end of the year and a stress test was planned but had not yet been done at the time of admission.  She was walking at work yesterday when she developed severe substernal chest pain and shortness of breath. She noted some loss of peripheral vision and thought she might pass out, but never did lose consciousness. In the ED her troponin was negative and her ECG showed no acute changes. Her pain had resolved, but Dr. Gwenlyn Found was concerned and proceeded with catheterization. She had an 80 % left main stenosis. She is currently pain free.     Past Medical History  Diagnosis Date  . Asthma   . GERD (gastroesophageal reflux disease)   . DDD (degenerative disc disease)   . Hyperlipemia   . Anxiety   . Hemorrhoids   . TMJ (temporomandibular joint disorder)     Past Surgical History  Procedure Laterality Date  . Cholecystectomy    . Lumbar fusion    . Cardiac catheterization N/A 10/29/2015    Procedure: Left Heart Cath and Coronary Angiography;  Surgeon: [name], MD;  Location: [facility];  Service: Cardiovascular;  Laterality: N/A;    Family History  Problem Relation Age of Onset  . Colon cancer Neg Hx   . Colon polyps Father   . Stomach cancer Paternal Grandmother   . Esophageal cancer Maternal Grandfather   . Breast cancer Mother   . Ovarian cancer      Maternal Side Great  Aunt    Social History:  reports  that she has been smoking.  She has never used smokeless tobacco. She reports that she does not drink alcohol or use illicit drugs.  Allergies:  Allergies  Allergen Reactions  . Iohexol      Code: RASH, Desc: White blisters in mouth during ivp in [place] '93, ok w/ 13 hour prep today//a.[place], Onset Date: [id number]   . Penicillins Other (See Comments)  Zithromax Sulfa  Medications:  Scheduled: . albuterol  2.5 mg Nebulization Once  . aspirin  81 mg Oral Daily  . atorvastatin  80 mg Oral q1800  . FLUoxetine  20 mg Oral Daily  . heparin  5,000 Units Subcutaneous 3 times per day  . ibuprofen  600 mg Oral Once  . metoprolol tartrate  12.5 mg Oral BID  . naproxen  500 mg Oral BID WC  . nitroGLYCERIN  0.5 inch Topical 4 times per day  . pantoprazole  40 mg Oral Daily  . sodium chloride  3 mL Intravenous Q12H    Results for orders placed or performed during the hospital encounter of 10/29/15 (from the past 48 hour(s))  Basic metabolic panel     Status: Abnormal   Collection Time: 10/29/15  9:52 AM  Result Value Ref Range   Sodium 140 135 - 145 mmol/L   Potassium 3.9 3.5 - 5.1 mmol/L   Chloride 105 101 -  111 mmol/L   CO2 26 22 - 32 mmol/L   Glucose, Bld 123 (H) 65 - 99 mg/dL   BUN 7 6 - 20 mg/dL   Creatinine, Ser 0.93 0.44 - 1.00 mg/dL   Calcium 9.2 8.9 - 10.3 mg/dL   GFR calc non Af Amer >60 >60 mL/min   GFR calc Af Amer >60 >60 mL/min    Comment: (NOTE) The eGFR has been calculated using the CKD EPI equation. This calculation has not been validated in all clinical situations. eGFR's persistently <60 mL/min signify possible Chronic Kidney Disease.    Anion gap 9 5 - 15  CBC     Status: None   Collection Time: 10/29/15  9:52 AM  Result Value Ref Range   WBC 10.3 4.0 - 10.5 K/uL   RBC 4.48 3.87 - 5.11 MIL/uL   Hemoglobin 13.6 12.0 - 15.0 g/dL   HCT 40.9 36.0 - 46.0 %   MCV 91.3 78.0 - 100.0 fL   MCH 30.4 26.0 - 34.0 pg   MCHC 33.3 30.0 - 36.0 g/dL   RDW 13.1  11.5 - 15.5 %   Platelets 317 150 - 400 K/uL  Hepatic function panel     Status: Abnormal   Collection Time: 10/29/15  9:52 AM  Result Value Ref Range   Total Protein 7.7 6.5 - 8.1 g/dL   Albumin 3.9 3.5 - 5.0 g/dL   AST 21 15 - 41 U/L   ALT 23 14 - 54 U/L   Alkaline Phosphatase 74 38 - 126 U/L   Total Bilirubin 0.4 0.3 - 1.2 mg/dL   Bilirubin, Direct <0.1 (L) 0.1 - 0.5 mg/dL   Indirect Bilirubin NOT CALCULATED 0.3 - 0.9 mg/dL  Brain natriuretic peptide     Status: None   Collection Time: 10/29/15  9:52 AM  Result Value Ref Range   B Natriuretic Peptide 11.4 0.0 - 100.0 pg/mL  I-stat troponin, ED (not at [facility])     Status: None   Collection Time: 10/29/15 10:16 AM  Result Value Ref Range   Troponin i, poc 0.00 0.00 - 0.08 ng/mL   Comment 3            Comment: Due to the release kinetics of cTnI, a negative result within the first hours of the onset of symptoms does not rule out myocardial infarction with certainty. If myocardial infarction is still suspected, repeat the test at appropriate intervals.   I-Stat Beta hCG blood, ED ([facility], [facility], [facility] only)     Status: None   Collection Time: 10/29/15 11:34 AM  Result Value Ref Range   I-stat hCG, quantitative <5.0 <5 mIU/mL   Comment 3            Comment:   GEST. AGE      CONC.  (mIU/mL)   <=1 WEEK        5 - 50     2 WEEKS       50 - 500     3 WEEKS       100 - 10,000     4 WEEKS     1,000 - 30,000        FEMALE AND NON-PREGNANT FEMALE:     LESS THAN 5 mIU/mL   Urinalysis, Routine w reflex microscopic (not at [facility])     Status: Abnormal   Collection Time: 10/29/15 11:44 AM  Result Value Ref Range   Color, Urine YELLOW YELLOW   APPearance CLOUDY (A) CLEAR  Specific Gravity, Urine 1.009 1.005 - 1.030   pH 7.0 5.0 - 8.0   Glucose, UA NEGATIVE NEGATIVE mg/dL   Hgb urine dipstick LARGE (A) NEGATIVE   Bilirubin Urine NEGATIVE NEGATIVE   Ketones, ur NEGATIVE NEGATIVE mg/dL   Protein, ur NEGATIVE NEGATIVE mg/dL   Nitrite  NEGATIVE NEGATIVE   Leukocytes, UA TRACE (A) NEGATIVE  Urine microscopic-add on     Status: Abnormal   Collection Time: 10/29/15 11:44 AM  Result Value Ref Range   Squamous Epithelial / LPF 0-5 (A) NONE SEEN   WBC, UA 0-5 0 - 5 WBC/hpf   RBC / HPF 6-30 0 - 5 RBC/hpf   Bacteria, UA FEW (A) NONE SEEN   Casts HYALINE CASTS (A) NEGATIVE    Comment: GRANULAR CAST  POCT Activated clotting time     Status: None   Collection Time: 10/29/15  3:32 PM  Result Value Ref Range   Activated Clotting Time 188 seconds  Hemoglobin A1c     Status: Abnormal   Collection Time: 10/29/15  7:50 PM  Result Value Ref Range   Hgb A1c MFr Bld 6.8 (H) 4.8 - 5.6 %    Comment: (NOTE)         Pre-diabetes: 5.7 - 6.4         Diabetes: >6.4         Glycemic control for adults with diabetes: <7.0    Mean Plasma Glucose 148 mg/dL    Comment: (NOTE) Performed At: [facility] [place], [place] [id number] Lindon Romp MD QX:[id number]   Protime-INR     Status: None   Collection Time: 10/29/15  7:50 PM  Result Value Ref Range   Prothrombin Time 14.5 11.6 - 15.2 seconds   INR 1.11 0.00 - 1.49  CBC     Status: Abnormal   Collection Time: 10/29/15  7:50 PM  Result Value Ref Range   WBC 11.3 (H) 4.0 - 10.5 K/uL   RBC 4.13 3.87 - 5.11 MIL/uL   Hemoglobin 12.2 12.0 - 15.0 g/dL   HCT 37.2 36.0 - 46.0 %   MCV 90.1 78.0 - 100.0 fL   MCH 29.5 26.0 - 34.0 pg   MCHC 32.8 30.0 - 36.0 g/dL   RDW 13.1 11.5 - 15.5 %   Platelets 267 150 - 400 K/uL  Creatinine, serum     Status: Abnormal   Collection Time: 10/29/15  7:50 PM  Result Value Ref Range   Creatinine, Ser 1.01 (H) 0.44 - 1.00 mg/dL   GFR calc non Af Amer >60 >60 mL/min   GFR calc Af Amer >60 >60 mL/min    Comment: (NOTE) The eGFR has been calculated using the CKD EPI equation. This calculation has not been validated in all clinical situations. eGFR's persistently <60 mL/min signify possible Chronic Kidney Disease.   TSH      Status: None   Collection Time: 10/29/15  7:50 PM  Result Value Ref Range   TSH 0.675 0.350 - 4.500 uIU/mL  Comprehensive metabolic panel     Status: Abnormal   Collection Time: 10/30/15  3:00 AM  Result Value Ref Range   Sodium 137 135 - 145 mmol/L   Potassium 3.9 3.5 - 5.1 mmol/L   Chloride 106 101 - 111 mmol/L   CO2 22 22 - 32 mmol/L   Glucose, Bld 248 (H) 65 - 99 mg/dL   BUN 9 6 - 20 mg/dL   Creatinine, Ser 0.97 0.44 - 1.00 mg/dL  Calcium 8.8 (L) 8.9 - 10.3 mg/dL   Total Protein 6.1 (L) 6.5 - 8.1 g/dL   Albumin 3.3 (L) 3.5 - 5.0 g/dL   AST 22 15 - 41 U/L   ALT 20 14 - 54 U/L   Alkaline Phosphatase 64 38 - 126 U/L   Total Bilirubin 0.3 0.3 - 1.2 mg/dL   GFR calc non Af Amer >60 >60 mL/min   GFR calc Af Amer >60 >60 mL/min    Comment: (NOTE) The eGFR has been calculated using the CKD EPI equation. This calculation has not been validated in all clinical situations. eGFR's persistently <60 mL/min signify possible Chronic Kidney Disease.    Anion gap 9 5 - 15  Lipid panel     Status: Abnormal   Collection Time: 10/30/15  3:00 AM  Result Value Ref Range   Cholesterol 252 (H) 0 - 200 mg/dL   Triglycerides 145 <150 mg/dL   HDL 31 (L) >40 mg/dL   Total CHOL/HDL Ratio 8.1 RATIO   VLDL 29 0 - 40 mg/dL   LDL Cholesterol 192 (H) 0 - 99 mg/dL    Comment:        Total Cholesterol/HDL:CHD Risk Coronary Heart Disease Risk Table                     Men   Women  1/2 Average Risk   3.4   3.3  Average Risk       5.0   4.4  2 X Average Risk   9.6   7.1  3 X Average Risk  23.4   11.0        Use the calculated Patient Ratio above and the CHD Risk Table to determine the patient's CHD Risk.        ATP III CLASSIFICATION (LDL):  <100     mg/dL   Optimal  100-129  mg/dL   Near or Above                    Optimal  130-159  mg/dL   Borderline  160-189  mg/dL   High  >190     mg/dL   Very High   CBC     Status: Abnormal   Collection Time: 10/30/15  3:00 AM  Result Value Ref Range    WBC 12.3 (H) 4.0 - 10.5 K/uL   RBC 4.14 3.87 - 5.11 MIL/uL   Hemoglobin 12.3 12.0 - 15.0 g/dL   HCT 37.7 36.0 - 46.0 %   MCV 91.1 78.0 - 100.0 fL   MCH 29.7 26.0 - 34.0 pg   MCHC 32.6 30.0 - 36.0 g/dL   RDW 13.2 11.5 - 15.5 %   Platelets 268 150 - 400 K/uL  Protime-INR     Status: None   Collection Time: 10/30/15  3:00 AM  Result Value Ref Range   Prothrombin Time 14.0 11.6 - 15.2 seconds   INR 1.06 0.00 - 1.49    Dg Chest 2 View  10/29/2015  CLINICAL DATA:  Mid chest pressure off and on for 3 weeks with exertion, hypertension, asthma, smoker EXAM: CHEST  2 VIEW COMPARISON:  03/20/2014 ; correlation CT chest 10/13/2011 FINDINGS: Normal heart size, mediastinal contours, and pulmonary vascularity. Minimal central peribronchial thickening. Focus of scarring in lingula adjacent to LEFT heart border similar to prior CT. No acute infiltrate, pleural effusion or pneumothorax. Bones unremarkable. IMPRESSION: Bronchitic changes with lingular scarring. No acute abnormalities. Electronically Signed   By:  Lavonia Dana M.D.   On: 10/29/2015 10:27    Review of Systems  Constitutional: Positive for malaise/fatigue. Negative for fever and chills.  Respiratory: Positive for shortness of breath. Negative for cough and wheezing.   Cardiovascular: Positive for chest pain. Negative for orthopnea and claudication.  Gastrointestinal: Negative for nausea, vomiting and blood in stool.  Genitourinary: Negative for dysuria and hematuria.  Musculoskeletal: Positive for back pain and joint pain.  Endo/Heme/Allergies: Does not bruise/bleed easily.  Psychiatric/Behavioral: The patient is nervous/anxious.   All other systems reviewed and are negative.  Blood pressure 124/75, pulse 78, temperature 98.3 F (36.8 C), temperature source Oral, resp. rate 15, height '5\' 7"'$  (1.702 m), weight 221 lb 14.4 oz (100.653 kg), last menstrual period 10/10/2015, SpO2 100 %. Physical Exam  Vitals reviewed. Constitutional: She is  oriented to person, place, and time. No distress.  obese  HENT:  Head: Normocephalic and atraumatic.  Mouth/Throat: No oropharyngeal exudate.  Eyes: Conjunctivae and EOM are normal. Pupils are equal, round, and reactive to light. No scleral icterus.  Neck: Neck supple. No thyromegaly present.  Cardiovascular: Normal rate, regular rhythm, normal heart sounds and intact distal pulses.  Exam reveals no gallop and no friction rub.   No murmur heard. Respiratory: Effort normal and breath sounds normal. She has no wheezes. She has no rales.  GI: Soft. She exhibits no distension. There is no tenderness.  Musculoskeletal: She exhibits no edema or tenderness.  Lymphadenopathy:    She has no cervical adenopathy.  Neurological: She is alert and oriented to person, place, and time. No cranial nerve deficit. She exhibits normal muscle tone.  Motor intact  Skin: Skin is warm and dry.  Psychiatric:  Anxious, tearful   I personally reviewed the cardiac catheterization. She has ostial left main disease.  Assessment/Plan: 50 yo woman with multiple CRF including hyperlipidemia, hypertension, tobacco abuse, and a strong family history presents with progressive exertional angina. She ruled out for MI. Catheterization revealed left main disease.  CABG (LIMA to LAD) is indicated for survival benefit and relief of symptoms.  I have discussed the general nature of the procedure, the need for general anesthesia, and the incisions to be used with Mrs. Matthes and her mother who was also present. We discussed the expected hospital stay, overall recovery and short and long term outcomes. She understands the risks include, but are not limited to death, stroke, MI, DVT/PE, bleeding, possible need for transfusion, infections, cardiac arrhythmias, and other organ system dysfunction including respiratory, renal, or GI complications. She accepts the risks and agrees to proceed.  In addition to other potential risks and  complications from the surgery, I have made the patient aware of the recent [place] concerning the risk of infection by Myocobacterium chimaera related to the use of [employer]ert 3T heater-cooler equipment during cardiac surgery. I discussed with the patient the low risk of infection, as well as our compliance with the most current FDA recommendations to minimize infection and testing of all devices for contamination. The patient has been made aware of the limited alternatives to immediately replacing the current equipment. The patient has been informed regarding the risks associated with waiting to proceed with needed surgery and that such risks are greater than the risk of infection related to the use of the heater-cooler device. I did make the patient aware that after careful review of the patients having cardiac surgery at [facility] we have no evidence that heater/cooler related infections have occurred at [facility]. We discussed that this is a slow-growing bacterium, such that it can take some period of time for symptoms to develop.  For CABG Monday 1/16 Melrose Nakayama 10/30/2015, 3:02 PM

## 2015-11-02 NOTE — Progress Notes (Signed)
At noon family (husband and mother) visited and updated.  At 1229 notified MD of radiology's call that the [facility] catheter needed to be pulled back. Dr. Roxan Hockey came at 1255, catheter now at 45cm.  Pt responds to commands and moves all extremities as of 13:00.  Henreitta Leber, RN 1:08 PM 11/02/2015

## 2015-11-02 NOTE — Procedures (Signed)
Extubation Procedure Note  Patient Details:   Name: Brenda Cruz DOB: 13-Oct-1966 MRN: [id number]   Airway Documentation:     Evaluation  O2 sats: stable throughout Complications: No apparent complications Patient did tolerate procedure well. Bilateral Breath Sounds: Clear Suctioning: Oral, Airway Yes   Pt. Was extubated to a 4L [place] without any complications, dyspnea or stridor noted. Pt. Achieved a goal of 1.3L on VC & -20 on NIF. Pt. Was instructed on IS x 5, highest goal achieved was was 931mL.   Stephen Baruch, Eddie North 11/02/2015, 3:52 PM

## 2015-11-02 NOTE — Transfer of Care (Signed)
Immediate Anesthesia Transfer of Care Note  Patient: Brenda Cruz  Procedure(s) Performed: Procedure(s): CORONARY ARTERY BYPASS GRAFTING (CABG)x 1 using left internal mammary artery. (N/A) TRANSESOPHAGEAL ECHOCARDIOGRAM (TEE) (N/A)  Patient Location: SICU  Anesthesia Type:General  Level of Consciousness: Patient remains intubated per anesthesia plan  Airway & Oxygen Therapy: Patient remains intubated per anesthesia plan and Patient placed on Ventilator (see vital sign flow sheet for setting)  Post-op Assessment: Report given to RN, Post -op Vital signs reviewed and stable, Patient moving all extremities and Patient moving all extremities X 4  Post vital signs: Reviewed and stable  Last Vitals:  Filed Vitals:   11/02/15 0424 11/02/15 0500  BP:  116/62  Pulse:  65  Temp: 36.3 C   Resp:  13    Complications: No apparent anesthesia complications

## 2015-11-02 NOTE — Interval H&P Note (Signed)
History and Physical Interval Note:  11/02/2015 7:17 AM  Brenda Cruz  has presented today for surgery, with the diagnosis of CAD  The various methods of treatment have been discussed with the patient and family. After consideration of risks, benefits and other options for treatment, the patient has consented to  Procedure(s): CORONARY ARTERY BYPASS GRAFTING (CABG) (N/A) TRANSESOPHAGEAL ECHOCARDIOGRAM (TEE) (N/A) as a surgical intervention .  The patient's history has been reviewed, patient examined, no change in status, stable for surgery.  I have reviewed the patient's chart and labs.  Questions were answered to the patient's satisfaction.     Melrose Nakayama

## 2015-11-02 NOTE — Progress Notes (Signed)
Patient transferred to preop holding without complications, report given to Anesthesia

## 2015-11-02 NOTE — Anesthesia Postprocedure Evaluation (Signed)
Anesthesia Post Note  Patient: Brenda Cruz  Procedure(s) Performed: Procedure(s) (LRB): CORONARY ARTERY BYPASS GRAFTING (CABG)x 1 using left internal mammary artery. (N/A) TRANSESOPHAGEAL ECHOCARDIOGRAM (TEE) (N/A)  Patient location during evaluation: SICU Anesthesia Type: General Level of consciousness: sedated and patient remains intubated per anesthesia plan Pain management: pain level controlled Respiratory status: spontaneous breathing Cardiovascular status: stable Anesthetic complications: no    Last Vitals:  Filed Vitals:   11/02/15 1315 11/02/15 1330  BP:    Pulse: 82 78  Temp: 36.2 C 36.3 C  Resp: 12 12    Last Pain:  Filed Vitals:   11/02/15 1331  PainSc: 0-No pain                 Nolon Nations

## 2015-11-02 NOTE — Progress Notes (Signed)
Echocardiogram Echocardiogram Transesophageal has been performed.  Joelene Millin 11/02/2015, 8:58 AM

## 2015-11-02 NOTE — Anesthesia Procedure Notes (Addendum)
Central Venous Catheter Insertion Performed by: anesthesiologist 11/02/2015 6:45 AM Preanesthetic checklist: patient identified, IV checked, site marked, risks and benefits discussed, surgical consent, monitors and equipment checked, pre-op evaluation, timeout performed and anesthesia consent Position: Trendelenburg Lidocaine 1% used for infiltration Landmarks identified and Seldinger technique used Catheter size: 7.5 Fr PA cath was placed.Single lumen Swan type and PA catheter depth:thermodilation and 48PA Cath depth:48 Procedure performed using ultrasound guided technique. Attempts: 1 Following insertion, line sutured. Post procedure assessment: blood return through all ports, free fluid flow and no air. Patient tolerated the procedure well with no immediate complications.   Procedure Name: Intubation Date/Time: 11/02/2015 7:40 AM Performed by: Izora Gala Pre-anesthesia Checklist: Patient identified, Emergency Drugs available, Suction available and Patient being monitored Patient Re-evaluated:Patient Re-evaluated prior to inductionOxygen Delivery Method: Circle system utilized Preoxygenation: Pre-oxygenation with 100% oxygen Intubation Type: IV induction Ventilation: Mask ventilation without difficulty Laryngoscope Size: Miller and 3 Grade View: Grade II Tube type: Oral Tube size: 7.5 mm Number of attempts: 1 Airway Equipment and Method: Stylet Placement Confirmation: ETT inserted through vocal cords under direct vision,  positive ETCO2 and breath sounds checked- equal and bilateral Secured at: 22 cm Tube secured with: Tape Dental Injury: Teeth and Oropharynx as per pre-operative assessment

## 2015-11-02 NOTE — Progress Notes (Signed)
Precedex off at 1445. RRT started weaning at 1450, switched to CPAP/PS at 1515, will check ABG at 1535.  Henreitta Leber, RN 3:18 PM 11/02/2015

## 2015-11-02 NOTE — Brief Op Note (Addendum)
10/29/2015 - 11/02/2015  10:09 AM  PATIENT:  Brenda Cruz  50 y.o. female  PRE-OPERATIVE DIAGNOSIS:  Left main Coronary artery disease  POST-OPERATIVE DIAGNOSIS:  Left main Coronary artery disease  PROCEDURE:  Procedure(s):  CORONARY ARTERY BYPASS GRAFTING x 1 -LIMA to LAD  TRANSESOPHAGEAL ECHOCARDIOGRAM (TEE) (N/A)  SURGEON:  Surgeon(s) and Role:    * Melrose Nakayama, MD - Primary  PHYSICIAN ASSISTANT: Ellwood Handler PA-C  ANESTHESIA:   general  EBL:  Total I/O In: -  Out: 100 [Urine:100]  BLOOD ADMINISTERED: CELLSAVER  DRAINS: Left Pleural Chest Tube, Mediastinal Chest Drains   LOCAL MEDICATIONS USED:  NONE  SPECIMEN:  No Specimen  DISPOSITION OF SPECIMEN:  N/A  COUNTS:  YES  PLAN OF CARE: Admit to inpatient   PATIENT DISPOSITION:  ICU - intubated and hemodynamically stable.   Delay start of Pharmacological VTE agent (>24hrs) due to surgical blood loss or risk of bleeding: yes  XC= 27 min CPB= 53 min  LAD deep intramyocardial  Brenda Gelpi C. Roxan Hockey, MD [facility] [phone]

## 2015-11-02 NOTE — Progress Notes (Signed)
      [address]       [place] [address]             [phone]      S/p CABG x 1  Extubated, neuro intact  BP 108/58 mmHg  Pulse 85  Temp(Src) 97.3 F (36.3 C) (Oral)  Resp 10  Ht 5\' 7"  (1.702 m)  Wt 216 lb 0.8 oz (98 kg)  BMI 33.83 kg/m2  SpO2 97%  LMP 10/10/2015  CI= 2.5   Intake/Output Summary (Last 24 hours) at 11/02/15 1834 Last data filed at 11/02/15 1821  Gross per 24 hour  Intake 11244.73 ml  Output   2100 ml  Net 9144.73 ml    Doing well early postop  Remo Lipps C. Roxan Hockey, MD [facility] [phone]

## 2015-11-03 ENCOUNTER — Encounter (HOSPITAL_COMMUNITY): Payer: Self-pay | Admitting: Thoracic Surgery (Cardiothoracic Vascular Surgery)

## 2015-11-03 ENCOUNTER — Inpatient Hospital Stay (HOSPITAL_COMMUNITY): Payer: Managed Care, Other (non HMO)

## 2015-11-03 DIAGNOSIS — Z951 Presence of aortocoronary bypass graft: Secondary | ICD-10-CM

## 2015-11-03 LAB — GLUCOSE, CAPILLARY
GLUCOSE-CAPILLARY: 108 mg/dL — AB (ref 65–99)
GLUCOSE-CAPILLARY: 111 mg/dL — AB (ref 65–99)
GLUCOSE-CAPILLARY: 114 mg/dL — AB (ref 65–99)
GLUCOSE-CAPILLARY: 118 mg/dL — AB (ref 65–99)
GLUCOSE-CAPILLARY: 122 mg/dL — AB (ref 65–99)
GLUCOSE-CAPILLARY: 125 mg/dL — AB (ref 65–99)
GLUCOSE-CAPILLARY: 126 mg/dL — AB (ref 65–99)
GLUCOSE-CAPILLARY: 130 mg/dL — AB (ref 65–99)
GLUCOSE-CAPILLARY: 134 mg/dL — AB (ref 65–99)
GLUCOSE-CAPILLARY: 140 mg/dL — AB (ref 65–99)
GLUCOSE-CAPILLARY: 143 mg/dL — AB (ref 65–99)
Glucose-Capillary: 104 mg/dL — ABNORMAL HIGH (ref 65–99)
Glucose-Capillary: 111 mg/dL — ABNORMAL HIGH (ref 65–99)
Glucose-Capillary: 119 mg/dL — ABNORMAL HIGH (ref 65–99)
Glucose-Capillary: 122 mg/dL — ABNORMAL HIGH (ref 65–99)
Glucose-Capillary: 126 mg/dL — ABNORMAL HIGH (ref 65–99)
Glucose-Capillary: 128 mg/dL — ABNORMAL HIGH (ref 65–99)
Glucose-Capillary: 130 mg/dL — ABNORMAL HIGH (ref 65–99)
Glucose-Capillary: 131 mg/dL — ABNORMAL HIGH (ref 65–99)
Glucose-Capillary: 135 mg/dL — ABNORMAL HIGH (ref 65–99)
Glucose-Capillary: 136 mg/dL — ABNORMAL HIGH (ref 65–99)
Glucose-Capillary: 150 mg/dL — ABNORMAL HIGH (ref 65–99)
Glucose-Capillary: 179 mg/dL — ABNORMAL HIGH (ref 65–99)

## 2015-11-03 LAB — POCT I-STAT, CHEM 8
BUN: 9 mg/dL (ref 6–20)
Calcium, Ion: 1.14 mmol/L (ref 1.12–1.23)
Chloride: 102 mmol/L (ref 101–111)
Creatinine, Ser: 0.9 mg/dL (ref 0.44–1.00)
Glucose, Bld: 133 mg/dL — ABNORMAL HIGH (ref 65–99)
HEMATOCRIT: 35 % — AB (ref 36.0–46.0)
HEMOGLOBIN: 11.9 g/dL — AB (ref 12.0–15.0)
POTASSIUM: 4.1 mmol/L (ref 3.5–5.1)
SODIUM: 138 mmol/L (ref 135–145)
TCO2: 24 mmol/L (ref 0–100)

## 2015-11-03 LAB — CBC
HCT: 30.7 % — ABNORMAL LOW (ref 36.0–46.0)
HCT: 32.8 % — ABNORMAL LOW (ref 36.0–46.0)
HEMOGLOBIN: 10.6 g/dL — AB (ref 12.0–15.0)
Hemoglobin: 9.9 g/dL — ABNORMAL LOW (ref 12.0–15.0)
MCH: 29.5 pg (ref 26.0–34.0)
MCH: 30.1 pg (ref 26.0–34.0)
MCHC: 32.2 g/dL (ref 30.0–36.0)
MCHC: 32.3 g/dL (ref 30.0–36.0)
MCV: 91.4 fL (ref 78.0–100.0)
MCV: 93.2 fL (ref 78.0–100.0)
PLATELETS: 200 10*3/uL (ref 150–400)
PLATELETS: 197 10*3/uL (ref 150–400)
RBC: 3.36 MIL/uL — ABNORMAL LOW (ref 3.87–5.11)
RBC: 3.52 MIL/uL — ABNORMAL LOW (ref 3.87–5.11)
RDW: 13.1 % (ref 11.5–15.5)
RDW: 13.6 % (ref 11.5–15.5)
WBC: 16.7 10*3/uL — ABNORMAL HIGH (ref 4.0–10.5)
WBC: 16.5 10*3/uL — ABNORMAL HIGH (ref 4.0–10.5)

## 2015-11-03 LAB — BASIC METABOLIC PANEL
Anion gap: 6 (ref 5–15)
BUN: 10 mg/dL (ref 6–20)
CO2: 23 mmol/L (ref 22–32)
CREATININE: 0.83 mg/dL (ref 0.44–1.00)
Calcium: 7.6 mg/dL — ABNORMAL LOW (ref 8.9–10.3)
Chloride: 107 mmol/L (ref 101–111)
GFR calc Af Amer: >60 mL/min (ref >60)
GLUCOSE: 125 mg/dL — AB (ref 65–99)
Potassium: 4.2 mmol/L (ref 3.5–5.1)
SODIUM: 136 mmol/L (ref 135–145)

## 2015-11-03 LAB — CREATININE, SERUM
CREATININE: 0.94 mg/dL (ref 0.44–1.00)
GFR calc Af Amer: >60 mL/min (ref >60)
GFR calc non Af Amer: >60 mL/min (ref >60)

## 2015-11-03 LAB — MAGNESIUM
MAGNESIUM: 2.3 mg/dL (ref 1.7–2.4)
Magnesium: 2.2 mg/dL (ref 1.7–2.4)

## 2015-11-03 MED ORDER — FUROSEMIDE 10 MG/ML IJ SOLN
20.0000 mg | Freq: Once | INTRAMUSCULAR | Status: AC
  Administered 2015-11-03: 20 mg via INTRAVENOUS

## 2015-11-03 MED ORDER — METOPROLOL TARTRATE 25 MG PO TABS
25.0000 mg | ORAL_TABLET | Freq: Two times a day (BID) | ORAL | Status: DC
  Administered 2015-11-03 – 2015-11-05 (×6): 25 mg via ORAL
  Filled 2015-11-03 (×8): qty 1

## 2015-11-03 MED ORDER — INSULIN DETEMIR 100 UNIT/ML ~~LOC~~ SOLN: 20.0000 [IU] | Freq: Two times a day (BID) | SUBCUTANEOUS | Status: DC

## 2015-11-03 MED ORDER — INSULIN ASPART 100 UNIT/ML ~~LOC~~ SOLN
0.0000 [IU] | SUBCUTANEOUS | Status: DC
  Administered 2015-11-03: 2 [IU] via SUBCUTANEOUS
  Administered 2015-11-03: 4 [IU] via SUBCUTANEOUS
  Administered 2015-11-03 – 2015-11-04 (×2): 2 [IU] via SUBCUTANEOUS

## 2015-11-03 MED ORDER — ENOXAPARIN SODIUM 40 MG/0.4ML ~~LOC~~ SOLN
40.0000 mg | Freq: Every day | SUBCUTANEOUS | Status: DC
  Administered 2015-11-03 – 2015-11-05 (×3): 40 mg via SUBCUTANEOUS
  Filled 2015-11-03 (×4): qty 0.4

## 2015-11-03 MED ORDER — INSULIN DETEMIR 100 UNIT/ML ~~LOC~~ SOLN
20.0000 [IU] | Freq: Two times a day (BID) | SUBCUTANEOUS | Status: DC
  Administered 2015-11-03 (×2): 20 [IU] via SUBCUTANEOUS
  Filled 2015-11-03 (×7): qty 0.2

## 2015-11-03 MED ORDER — INSULIN ASPART 100 UNIT/ML ~~LOC~~ SOLN: 3.0000 [IU] | Freq: Three times a day (TID) | SUBCUTANEOUS | Status: DC

## 2015-11-03 MED ORDER — METOPROLOL TARTRATE 25 MG/10 ML ORAL SUSPENSION: 25.0000 mg | Freq: Two times a day (BID) | ORAL | Status: DC

## 2015-11-03 NOTE — Plan of Care (Signed)
Unable to draw blood from sleeve, when flushed fluid just leaks out at site. Sleeve DC'ed.

## 2015-11-03 NOTE — Progress Notes (Signed)
1 Day Post-Op Procedure(s) (LRB): CORONARY ARTERY BYPASS GRAFTING (CABG)x 1 using left internal mammary artery. (N/A) TRANSESOPHAGEAL ECHOCARDIOGRAM (TEE) (N/A) Subjective: Some incisional pain, nausea earlier- not presently  Objective: Vital signs in last 24 hours: Temp:  [95.4 F (35.2 C)-99.1 F (37.3 C)] 98.6 F (37 C) (01/17 0800) Pulse Rate:  [69-108] 90 (01/17 0800) Cardiac Rhythm:  [-] Normal sinus rhythm (01/17 0700) Resp:  [10-30] 19 (01/17 0800) BP: (99-128)/(55-98) 127/62 mmHg (01/16 2200) SpO2:  [92 %-100 %] 96 % (01/17 0800) Arterial Line BP: (91-152)/(52-75) 121/55 mmHg (01/17 0800) FiO2 (%):  [40 %-50 %] 40 % (01/16 1515) Weight:  [227 lb 8.2 oz (103.2 kg)] 227 lb 8.2 oz (103.2 kg) (01/17 0357)  Hemodynamic parameters for last 24 hours: PAP: (21-47)/(15-29) 45/28 mmHg CO:  [4.7 L/min-7.9 L/min] 7.1 L/min CI:  [1.8 L/min/m2-3.8 L/min/m2] 3.4 L/min/m2  Intake/Output from previous day: 01/16 0701 - 01/17 0700 In: 11164.9 [P.O.:100; I.V.:10191.9; Blood:273; IV Piggyback:600] Out: 2545 [Urine:1465; Blood:650; Chest Tube:430] Intake/Output this shift: Total I/O In: 63.1 [I.V.:63.1] Out: 45 [Urine:45]  General appearance: alert, cooperative and no distress Neurologic: intact Heart: regular rate and rhythm Lungs: diminished breath sounds bibasilar Abdomen: normal findings: soft, non-tender  Lab Results:  Recent Labs  11/02/15 1653 11/02/15 1701 11/03/15 0300  WBC 17.3*  --  16.7*  HGB 10.6* 11.6* 9.9*  HCT 32.7* 34.0* 30.7*  PLT 188  --  200   BMET:  Recent Labs  11/02/15 0034  11/02/15 1701 11/03/15 0300  NA 138  < > 138 136  K 3.7  < > 4.0 4.2  CL 106  < > 106 107  CO2 24  --   --  23  GLUCOSE 136*  < > 139* 125*  BUN 9  < > 9 10  CREATININE 0.90  < > 0.80 0.83  CALCIUM 8.6*  --   --  7.6*  < > = values in this interval not displayed.  PT/INR:  Recent Labs  11/02/15 1141  LABPROT 16.8*  INR 1.35   ABG    Component Value Date/Time    PHART 7.335* 11/02/2015 1656   HCO3 21.4 11/02/2015 1656   TCO2 21 11/02/2015 1701   ACIDBASEDEF 4.0* 11/02/2015 1656   O2SAT 96.0 11/02/2015 1656   CBG (last 3)   Recent Labs  11/02/15 2234 11/02/15 2351 11/03/15 0058  GLUCAP 126* 114* 122*    Assessment/Plan: S/P Procedure(s) (LRB): CORONARY ARTERY BYPASS GRAFTING (CABG)x 1 using left internal mammary artery. (N/A) TRANSESOPHAGEAL ECHOCARDIOGRAM (TEE) (N/A) -  CV- good hemodynamics- dc swan  ASA, lipitor, metoprolol  RESP- some atelectasis- IS  RENAL- creatinine and lytes OK  Will give low dose lasix this AM to stimulate diuresis  ENDO- CBG well controlled with insulin drip  Transition to levemir + SSI  Anemia secondary to ABL- mild follow  DVT prophylaxis- SCD + enoxaparin  DC Chest tubes  mobilize   LOS: 5 days    Melrose Nakayama 11/03/2015

## 2015-11-03 NOTE — Progress Notes (Signed)
Utilization review completed.  

## 2015-11-03 NOTE — Progress Notes (Signed)
Patient ID: Brenda Cruz, female   DOB: 06-22-1966, 50 y.o.   MRN: [id number]  SICU Evening Rounds:  Hemodynamically stable  Ambulated today.  Urine output good.  BMET    Component Value Date/Time   NA 138 11/03/2015 1724   NA 138 10/16/2015 1638   K 4.1 11/03/2015 1724   CL 102 11/03/2015 1724   CO2 23 11/03/2015 0300   GLUCOSE 133* 11/03/2015 1724   GLUCOSE 171* 10/16/2015 1638   BUN 9 11/03/2015 1724   BUN 6 10/16/2015 1638   CREATININE 0.94 11/03/2015 1725   CALCIUM 7.6* 11/03/2015 0300   GFRNONAA >60 11/03/2015 1725   GFRAA >60 11/03/2015 1725    CBC    Component Value Date/Time   WBC 16.5* 11/03/2015 1725   WBC 11.7* 05/22/2015 1531   RBC 3.52* 11/03/2015 1725   RBC 4.37 05/22/2015 1531   HGB 10.6* 11/03/2015 1725   HGB 12.9 05/22/2015 1531   HCT 32.8* 11/03/2015 1725   HCT 39.3 05/22/2015 1531   PLT 197 11/03/2015 1725   MCV 93.2 11/03/2015 1725   MCV 89.8 05/22/2015 1531   MCH 30.1 11/03/2015 1725   MCH 29.6 05/22/2015 1531   MCHC 32.3 11/03/2015 1725   MCHC 32.9 05/22/2015 1531   RDW 13.6 11/03/2015 1725    A/P: stable day. Continue current plans.

## 2015-11-04 ENCOUNTER — Inpatient Hospital Stay (HOSPITAL_COMMUNITY): Payer: Managed Care, Other (non HMO)

## 2015-11-04 LAB — BASIC METABOLIC PANEL
Anion gap: 6 (ref 5–15)
BUN: 7 mg/dL (ref 6–20)
CHLORIDE: 107 mmol/L (ref 101–111)
CO2: 26 mmol/L (ref 22–32)
CREATININE: 0.86 mg/dL (ref 0.44–1.00)
Calcium: 8 mg/dL — ABNORMAL LOW (ref 8.9–10.3)
GFR calc Af Amer: >60 mL/min (ref >60)
GFR calc non Af Amer: >60 mL/min (ref >60)
Glucose, Bld: 117 mg/dL — ABNORMAL HIGH (ref 65–99)
Potassium: 3.9 mmol/L (ref 3.5–5.1)
SODIUM: 139 mmol/L (ref 135–145)

## 2015-11-04 LAB — CBC
HCT: 28.2 % — ABNORMAL LOW (ref 36.0–46.0)
Hemoglobin: 9.2 g/dL — ABNORMAL LOW (ref 12.0–15.0)
MCH: 30.5 pg (ref 26.0–34.0)
MCHC: 32.6 g/dL (ref 30.0–36.0)
MCV: 93.4 fL (ref 78.0–100.0)
PLATELETS: 158 10*3/uL (ref 150–400)
RBC: 3.02 MIL/uL — ABNORMAL LOW (ref 3.87–5.11)
RDW: 13.8 % (ref 11.5–15.5)
WBC: 13.4 10*3/uL — AB (ref 4.0–10.5)

## 2015-11-04 LAB — GLUCOSE, CAPILLARY
GLUCOSE-CAPILLARY: 144 mg/dL — AB (ref 65–99)
Glucose-Capillary: 107 mg/dL — ABNORMAL HIGH (ref 65–99)
Glucose-Capillary: 97 mg/dL (ref 65–99)
Glucose-Capillary: 98 mg/dL (ref 65–99)

## 2015-11-04 MED ORDER — MAGNESIUM HYDROXIDE 400 MG/5ML PO SUSP: 30.0000 mL | Freq: Every day | ORAL | Status: DC | PRN

## 2015-11-04 MED ORDER — LISINOPRIL 10 MG PO TABS
10.0000 mg | ORAL_TABLET | Freq: Every day | ORAL | Status: DC
  Administered 2015-11-04 – 2015-11-05 (×2): 10 mg via ORAL
  Filled 2015-11-04 (×2): qty 1

## 2015-11-04 MED ORDER — ALUM & MAG HYDROXIDE-SIMETH 200-200-20 MG/5ML PO SUSP: 15.0000 mL | ORAL | Status: DC | PRN

## 2015-11-04 MED ORDER — SODIUM CHLORIDE 0.9 % IJ SOLN: 3.0000 mL | INTRAMUSCULAR | Status: DC | PRN

## 2015-11-04 MED ORDER — INSULIN ASPART 100 UNIT/ML ~~LOC~~ SOLN
0.0000 [IU] | Freq: Three times a day (TID) | SUBCUTANEOUS | Status: DC
  Administered 2015-11-04 – 2015-11-06 (×5): 2 [IU] via SUBCUTANEOUS

## 2015-11-04 MED ORDER — INSULIN DETEMIR 100 UNIT/ML ~~LOC~~ SOLN
20.0000 [IU] | Freq: Every day | SUBCUTANEOUS | Status: DC
  Filled 2015-11-04: qty 0.2

## 2015-11-04 MED ORDER — SODIUM CHLORIDE 0.9 % IV SOLN: 250.0000 mL | INTRAVENOUS | Status: DC | PRN

## 2015-11-04 MED ORDER — SODIUM CHLORIDE 0.9 % IJ SOLN
3.0000 mL | Freq: Two times a day (BID) | INTRAMUSCULAR | Status: DC
  Administered 2015-11-04 – 2015-11-05 (×3): 3 mL via INTRAVENOUS

## 2015-11-04 MED ORDER — GUAIFENESIN-DM 100-10 MG/5ML PO SYRP: 15.0000 mL | ORAL_SOLUTION | ORAL | Status: DC | PRN

## 2015-11-04 MED ORDER — MOVING RIGHT ALONG BOOK
Freq: Once | Status: AC
  Administered 2015-11-04: 18:00:00
  Filled 2015-11-04: qty 1

## 2015-11-04 NOTE — Care Management Note (Signed)
Case Management Note  Patient Details  Name: Brenda Cruz MRN: [id number] Date of Birth: 05/18/1966  Subjective/Objective:      Pt lives with family, states her husband and her mother have made arrangements so that someone will be with her 24/7 when she is medically stable for discharge.                      Expected Discharge Plan:  Home/Self Care  Discharge planning Services  CM Consult  Status of Service:  In process, will continue to follow  Girard Cooter, RN 11/04/2015, 1:21 PM

## 2015-11-04 NOTE — Progress Notes (Signed)
2 Days Post-Op Procedure(s) (LRB): CORONARY ARTERY BYPASS GRAFTING (CABG)x 1 using left internal mammary artery. (N/A) TRANSESOPHAGEAL ECHOCARDIOGRAM (TEE) (N/A) Subjective: Some incisional pain  Objective: Vital signs in last 24 hours: Temp:  [97.6 F (36.4 C)-99.6 F (37.6 C)] 98.3 F (36.8 C) (01/18 0829) Pulse Rate:  [73-102] 81 (01/18 0700) Cardiac Rhythm:  [-] Normal sinus rhythm (01/18 0700) Resp:  [11-29] 13 (01/18 0700) BP: (104-140)/(57-72) 125/60 mmHg (01/18 0700) SpO2:  [88 %-99 %] 98 % (01/18 0700) Arterial Line BP: (135)/(61-70) 135/61 mmHg (01/17 1000) Weight:  [229 lb 6.4 oz (104.055 kg)] 229 lb 6.4 oz (104.055 kg) (01/18 0635)  Hemodynamic parameters for last 24 hours: PAP: (39-46)/(22-28) 39/22 mmHg  Intake/Output from previous day: 01/17 0701 - 01/18 0700 In: 892.4 [P.O.:480; I.V.:212.4; IV Piggyback:200] Out: 2265 [Urine:2265] Intake/Output this shift:    General appearance: alert, cooperative and no distress Neurologic: intact Heart: regular rate and rhythm Lungs: diminished breath sounds bibasilar Wound: clean and dry  Lab Results:  Recent Labs  11/03/15 1725 11/04/15 0441  WBC 16.5* 13.4*  HGB 10.6* 9.2*  HCT 32.8* 28.2*  PLT 197 158   BMET:  Recent Labs  11/03/15 0300 11/03/15 1724 11/03/15 1725 11/04/15 0441  NA 136 138  --  139  K 4.2 4.1  --  3.9  CL 107 102  --  107  CO2 23  --   --  26  GLUCOSE 125* 133*  --  117*  BUN 10 9  --  7  CREATININE 0.83 0.90 0.94 0.86  CALCIUM 7.6*  --   --  8.0*    PT/INR:  Recent Labs  11/02/15 1141  LABPROT 16.8*  INR 1.35   ABG    Component Value Date/Time   PHART 7.335* 11/02/2015 1656   HCO3 21.4 11/02/2015 1656   TCO2 24 11/03/2015 1724   ACIDBASEDEF 4.0* 11/02/2015 1656   O2SAT 96.0 11/02/2015 1656   CBG (last 3)   Recent Labs  11/03/15 1932 11/03/15 2341 11/04/15 0358  GLUCAP 179* 122* 107*    Assessment/Plan: S/P Procedure(s) (LRB): CORONARY ARTERY BYPASS  GRAFTING (CABG)x 1 using left internal mammary artery. (N/A) TRANSESOPHAGEAL ECHOCARDIOGRAM (TEE) (N/A) Doing well POD # 2 CV- stable. Continue ASA, beta blocker, statn  RESP- CXR -NAD, incentive spirometry  RENAL- lytes, creatinine OK, dc Foley  ENDO- CBG well controlled, change to AC/HS  Transfer to 2 west  Continue ambulation  SCD = enoxaparin for DVT prophylaxis   LOS: 6 days    Melrose Nakayama 11/04/2015

## 2015-11-05 LAB — BASIC METABOLIC PANEL
ANION GAP: 7 (ref 5–15)
BUN: 8 mg/dL (ref 6–20)
CO2: 26 mmol/L (ref 22–32)
Calcium: 8.2 mg/dL — ABNORMAL LOW (ref 8.9–10.3)
Chloride: 106 mmol/L (ref 101–111)
Creatinine, Ser: 0.89 mg/dL (ref 0.44–1.00)
GFR calc Af Amer: >60 mL/min (ref >60)
GLUCOSE: 120 mg/dL — AB (ref 65–99)
POTASSIUM: 4 mmol/L (ref 3.5–5.1)
Sodium: 139 mmol/L (ref 135–145)

## 2015-11-05 LAB — GLUCOSE, CAPILLARY
GLUCOSE-CAPILLARY: 124 mg/dL — AB (ref 65–99)
GLUCOSE-CAPILLARY: 151 mg/dL — AB (ref 65–99)
Glucose-Capillary: 135 mg/dL — ABNORMAL HIGH (ref 65–99)
Glucose-Capillary: 123 mg/dL — ABNORMAL HIGH (ref 65–99)
Glucose-Capillary: 147 mg/dL — ABNORMAL HIGH (ref 65–99)

## 2015-11-05 LAB — CBC
HCT: 27.1 % — ABNORMAL LOW (ref 36.0–46.0)
Hemoglobin: 8.7 g/dL — ABNORMAL LOW (ref 12.0–15.0)
MCH: 29.5 pg (ref 26.0–34.0)
MCHC: 32.1 g/dL (ref 30.0–36.0)
MCV: 91.9 fL (ref 78.0–100.0)
PLATELETS: 175 10*3/uL (ref 150–400)
RBC: 2.95 MIL/uL — AB (ref 3.87–5.11)
RDW: 13.8 % (ref 11.5–15.5)
WBC: 11.6 10*3/uL — AB (ref 4.0–10.5)

## 2015-11-05 MED ORDER — FUROSEMIDE 40 MG PO TABS
40.0000 mg | ORAL_TABLET | Freq: Every day | ORAL | Status: DC
  Administered 2015-11-05 – 2015-11-06 (×2): 40 mg via ORAL
  Filled 2015-11-05 (×2): qty 1

## 2015-11-05 MED ORDER — POTASSIUM CHLORIDE CRYS ER 20 MEQ PO TBCR
20.0000 meq | EXTENDED_RELEASE_TABLET | Freq: Every day | ORAL | Status: DC
  Administered 2015-11-05 – 2015-11-06 (×2): 20 meq via ORAL
  Filled 2015-11-05 (×2): qty 1

## 2015-11-05 NOTE — Progress Notes (Addendum)
      [address]       [place] [address]             [phone]        3 Days Post-Op Procedure(s) (LRB): CORONARY ARTERY BYPASS GRAFTING (CABG)x 1 using left internal mammary artery. (N/A) TRANSESOPHAGEAL ECHOCARDIOGRAM (TEE) (N/A)  Subjective: Patient with some incisional pain this am.  Objective: Vital signs in last 24 hours: Temp:  [98 F (36.7 C)-98.9 F (37.2 C)] 98.7 F (37.1 C) (01/19 0504) Pulse Rate:  [84-102] 85 (01/19 0504) Cardiac Rhythm:  [-] Normal sinus rhythm (01/18 1900) Resp:  [12-18] 15 (01/19 0504) BP: (102-138)/(47-76) 102/53 mmHg (01/19 0504) SpO2:  [89 %-100 %] 91 % (01/19 0504)  Pre op weight 98 kg Current Weight  11/04/15 229 lb 6.4 oz (104.055 kg)      Intake/Output from previous day: 01/18 0701 - 01/19 0700 In: 723 [P.O.:720; I.V.:3] Out: 350 [Urine:350]   Physical Exam:  Cardiovascular: RRR Pulmonary: Diminished at bases bilaterally; no rales, wheezes, or rhonchi. Abdomen: Soft, non tender, bowel sounds present. Extremities: Mild bilateral lower extremity edema. Wound: Clean and dry.  No erythema or signs of infection.  Lab Results: CBC: Recent Labs  11/04/15 0441 11/05/15 0244  WBC 13.4* 11.6*  HGB 9.2* 8.7*  HCT 28.2* 27.1*  PLT 158 175   BMET:  Recent Labs  11/04/15 0441 11/05/15 0244  NA 139 139  K 3.9 4.0  CL 107 106  CO2 26 26  GLUCOSE 117* 120*  BUN 7 8  CREATININE 0.86 0.89  CALCIUM 8.0* 8.2*    PT/INR:  Lab Results  Component Value Date   INR 1.35 11/02/2015   INR 1.06 10/30/2015   INR 1.11 10/29/2015   ABG:  INR: Will add last result for INR, ABG once components are confirmed Will add last 4 CBG results once components are confirmed  Assessment/Plan:  1. CV - SR in the 90's . On Lopressor 25 mg bid and Lisinopril 10 mg daily.  2.  Pulmonary - On room air. Encourage incentive spirometer 3. Volume Overload - Will give Lasix 40 mg daily 4.  Acute blood loss anemia - H  and H 8.7 and 27.1 5. Pre op HGA1C 6.8. She is a new diabetic and will require medical follow up after discharge. 6. Remove EPW 7. Possibly home in am  ZIMMERMAN,DONIELLE MPA-C 11/05/2015,8:56 AM  Patient seen and examined, agree with above She feels depressed this AM- I told her that is a normal reaction after CABG or any major surgery and will improve with time Agree with dc EPW Hopefully home in AM  [place] C. Roxan Hockey, MD [facility] [phone]

## 2015-11-05 NOTE — Progress Notes (Signed)
CARDIAC REHAB PHASE I   PRE:  Rate/Rhythm: 86 SR  BP:  Sitting: 120/51        SaO2: 94 RA  MODE:  Ambulation: 240 ft   POST:  Rate/Rhythm: 93 SR  BP:  Sitting: 127/58         SaO2: 94 RA  Pt in bed, c/o some incisional pain. Reports poor appetite, states she feels "lonely." Pt states she has a feeling of "fear," pt generally anxious this morning. Pt needed cues for sternal precautions, moderate assistance from lying to sittting position in bed, stood independently. Pt ambulated 240 ft on RA, rolling walker, hand held assist, slow, steady gait, tolerated well. Pt c/o mild DOE, denies dizziness, brief standing rest x1. Pt to edge of bed after walk, call bell within reach, RN at bedside. Encouraged IS, additional ambulation. Pt verbalized understanding. Will follow.   [id number] Brenda Sciara, RN, BSN 11/05/2015 10:24 AM

## 2015-11-05 NOTE — Discharge Instructions (Signed)
Coronary Artery Bypass Grafting, Care After °Refer to this sheet in the next few weeks. These instructions provide you with information on caring for yourself after your procedure. Your health care provider may also give you more specific instructions. Your treatment has been planned according to current medical practices, but problems sometimes occur. Call your health care provider if you have any problems or questions after your procedure. °WHAT TO EXPECT AFTER THE PROCEDURE °Recovery from surgery will be different for everyone. Some people feel well after 3 or 4 weeks, while for others it takes longer. After your procedure, it is typical to have the following: °· Nausea and a lack of appetite.   °· Constipation. °· Weakness and fatigue.   °· Depression or irritability.   °· Pain or discomfort at your incision site. °HOME CARE INSTRUCTIONS °· Take medicines only as directed by your health care provider. Do not stop taking medicines or start any new medicines without first checking with your health care provider. °· Take your pulse as directed by your health care provider. °· Perform deep breathing as directed by your health care provider. If you were given a device called an incentive spirometer, use it to practice deep breathing several times a day. Support your chest with a pillow or your arms when you take deep breaths or cough. °· Keep incision areas clean, dry, and protected. Remove or change any bandages (dressings) only as directed by your health care provider. You may have skin adhesive strips over the incision areas. Do not take the strips off. They will fall off on their own. °· Check incision areas daily for any swelling, redness, or drainage. °· If incisions were made in your legs, do the following: °¨ Avoid crossing your legs.   °¨ Avoid sitting for long periods of time. Change positions every 30 minutes.   °¨ Elevate your legs when you are sitting. °· Wear compression stockings as directed by your  health care provider. These stockings help keep blood clots from forming in your legs. °· Take showers once your health care provider approves. Until then, only take sponge baths. Pat incisions dry. Do not rub incisions with a washcloth or towel. Do not take baths, swim, or use a hot tub until your health care provider approves. °· Eat foods that are high in fiber, such as raw fruits and vegetables, whole grains, beans, and nuts. Meats should be lean cut. Avoid canned, processed, and fried foods. °· Drink enough fluid to keep your urine clear or pale yellow. °· Weigh yourself every day. This helps identify if you are retaining fluid that may make your heart and lungs work harder. °· Rest and limit activity as directed by your health care provider. You may be instructed to: °¨ Stop any activity at once if you have chest pain, shortness of breath, irregular heartbeats, or dizziness. Get help right away if you have any of these symptoms. °¨ Move around frequently for short periods or take short walks as directed by your health care provider. Increase your activities gradually. You may need physical therapy or cardiac rehabilitation to help strengthen your muscles and build your endurance. °¨ Avoid lifting, pushing, or pulling anything heavier than 10 lb (4.5 kg) for at least 6 weeks after surgery. °· Do not drive until your health care provider approves.  °· Ask your health care provider when you may return to work. °· Ask your health care provider when you may resume sexual activity. °· Keep all follow-up visits as directed by your health care   provider. This is important. °SEEK MEDICAL CARE IF: °· You have swelling, redness, increasing pain, or drainage at the site of an incision. °· You have a fever. °· You have swelling in your ankles or legs. °· You have pain in your legs.   °· You gain 2 or more pounds (0.9 kg) a day. °· You are nauseous or vomit. °· You have diarrhea.  °SEEK IMMEDIATE MEDICAL CARE IF: °· You have  chest pain that goes to your jaw or arms. °· You have shortness of breath.   °· You have a fast or irregular heartbeat.   °· You notice a "clicking" in your breastbone (sternum) when you move.   °· You have numbness or weakness in your arms or legs. °· You feel dizzy or light-headed.   °MAKE SURE YOU: °· Understand these instructions. °· Will watch your condition. °· Will get help right away if you are not doing well or get worse. °  °This information is not intended to replace advice given to you by your health care provider. Make sure you discuss any questions you have with your health care provider. °  °Document Released: 04/22/2005 Document Revised: 10/24/2014 Document Reviewed: 03/12/2013 °Elsevier Interactive Patient Education ©2016 [employer] ° °

## 2015-11-05 NOTE — Progress Notes (Signed)
Pacer wires were taken out. Patient tolerated it well. Site is clean and dry. Bp remained stable. Will continue to monitor.

## 2015-11-05 NOTE — Discharge Summary (Signed)
Physician Discharge Summary  Patient ID: Brenda Cruz MRN: [id number] DOB/AGE: Jan 20, 1966 50 y.o.  Admit date: 10/29/2015 Discharge date: 11/06/2015  Admission Diagnoses:  Patient Active Problem List   Diagnosis Date Noted  . Coronary artery disease involving native coronary artery of native heart without angina pectoris   . Chest pain 10/29/2015  . Coronary artery disease due to lipid rich plaque 10/29/2015  . Chest pain, exertional   . Essential hypertension 10/16/2015  . Morbid obesity ([place]) 10/16/2015  . GERD (gastroesophageal reflux disease) 05/16/2013  . Depression 05/16/2013  . GAD (generalized anxiety disorder) 05/16/2013   Discharge Diagnoses:   Patient Active Problem List   Diagnosis Date Noted  . S/P CABG x 1 11/02/2015  . Coronary artery disease involving native coronary artery of native heart without angina pectoris   . Chest pain 10/29/2015  . Coronary artery disease due to lipid rich plaque 10/29/2015  . Chest pain, exertional   . Essential hypertension 10/16/2015  . Morbid obesity ([place]) 10/16/2015  . GERD (gastroesophageal reflux disease) 05/16/2013  . Depression 05/16/2013  . GAD (generalized anxiety disorder) 05/16/2013   Discharged Condition: good  History of Present Illness:  Brenda Cruz is a 50 yo obese female with known history of nicotine abuse, reflux, hypertension, hyperlipidemia, borderline diabetes, and a strong family history of CAD.  The patient had been in her usual state of health, except over the past few months the patient noticed she was having chest pain during exertion.  She originally thought it was her reflux or anxiety.  She presented to her PCP at the end of the year who recommended stress test.  However, this had not yet been completed.  On 10/30/2015 the patient was walking while at work at which time she developed sudden onset severe substernal chest pain and shortness of breath.  She states she loss some peripheral vision and felt  like she was going to pass out.  She was brought to the ED at which time EKG was obtained and showed no acute changes and her Troponin was not elevated.  Her chest pain resolved, but Cardiology consultation was obtained and based on her presenting symptoms it was felt cardiac catheterization would be indicated.    Hospital Course:   She was taken to the cath lab and found to have severe single vessel CAD with a preserved EF.  It was felt coronary bypass grafting would be indicated and TCTS consult was obtained.  She was evaluated by Dr. Roxan Hockey who was in agreement Coronary bypass grafting would be indicated.  The risks and benefits of the procedure were explained to the patient and she was agreeable to proceed.  She remained chest pain free prior to surgery.  She was taken to the operating room 11/02/2015.  She underwent CABG x 1 utilizing LIMA to LAD.  She tolerated the procedure without difficulty and was taken to the SICU in stable condition.  She was extubated the evening of surgery.  During her stay in the SICU the patient's arterial line and chest tubes were removed without difficulty.  She was hypervolemic and responded well to Lasix.  She was maintaining NSR and ambulating around the SICU.  She was felt medically stable for transfer to the telemetry unit on POD #2.  The patient continues to make progress.  She continues to maintain NSR and her pacing wires have been removed without difficulty.  She is a new diabetic with a preoperative A1c of 6.8.  She will require close  follow up with her PCP at discharge.  She is ambulating independently.  She is tolerating a diabetic diet.  She is felt medically stable for discharge home today.  Significant Diagnostic Studies: angiography:   Left Main   . Ost LM lesion, 80% stenosed.     Treatments: surgery:   Median sternotomy, extracorporeal circulation, coronary artery bypass grafting x1 (left internal mammary artery to LAD).  Disposition:   Home  Discharge Medications:  The patient has been discharged on:   1.Beta Blocker:  Yes [ x  ]                              No   [   ]                              If No, reason:  2.Ace Inhibitor/ARB: Yes [ x  ]                                     No  [    ]                                     If No, reason:  3.Statin:   Yes [ x  ]                  No  [   ]                  If No, reason:  4.Ecasa:  Yes  [ x  ]                  No   [   ]                  If No, reason:     Medication List    STOP taking these medications        naproxen 500 MG tablet  Commonly known as:  NAPROSYN      TAKE these medications        albuterol 108 (90 Base) MCG/ACT inhaler  Commonly known as:  PROVENTIL HFA;VENTOLIN HFA  Inhale 2 puffs into the lungs every 6 (six) hours as needed for wheezing or shortness of breath.     aspirin 325 MG EC tablet  Take 1 tablet (325 mg total) by mouth daily.     atorvastatin 80 MG tablet  Commonly known as:  LIPITOR  Take 1 tablet (80 mg total) by mouth daily at 6 PM.     esomeprazole 40 MG capsule  Commonly known as:  NEXIUM  TAKE ONE (1) CAPSULE EACH DAY     FLUoxetine 20 MG capsule  Commonly known as:  PROZAC  TAKE ONE (1) CAPSULE EACH DAY     furosemide 40 MG tablet  Commonly known as:  LASIX  Take 1 tablet (40 mg total) by mouth daily. For 7 Days     lisinopril 5 MG tablet  Commonly known as:  PRINIVIL,ZESTRIL  Take 1 tablet (5 mg total) by mouth daily.     LORazepam 1 MG tablet  Commonly known as:  ATIVAN  TAKE 1 TABLET TWICE DAILY AS NEEDED FOR ANXIETY  metoprolol 50 MG tablet  Commonly known as:  LOPRESSOR  Take 1 tablet (50 mg total) by mouth 2 (two) times daily.     oxyCODONE 5 MG immediate release tablet  Commonly known as:  Oxy IR/ROXICODONE  Take 1-2 tablets (5-10 mg total) by mouth every 3 (three) hours as needed for severe pain.     potassium chloride SA 20 MEQ tablet  Commonly known as:  K-DUR,KLOR-CON  Take  1 tablet (20 mEq total) by mouth daily. For 7 Days     traMADol 50 MG tablet  Commonly known as:  ULTRAM  TAKE 1 TABLET 3 TIMES DAILY AS NEEDED FOR PAIN       Follow-up Information    Follow up with Melrose Nakayama, MD On 12/01/2015.   Specialty:  Cardiothoracic Surgery   Why:  Appointment is at 9:45   Contact information:   [address] [place] [address] [phone]       Follow up with [facility] On 12/01/2015.   Why:  Please get CXR at 9:15, located on first floor of [place] information:   [facility]       Follow up with Rosaria Ferries, PA-C On 11/18/2015.   Specialties:  Cardiology, Radiology   Why:  Appointment is at 2:30   Contact information:   [place] [address] [phone]       Follow up with Eustaquio Maize, MD.   Specialty:  Pediatrics   Why:  please follow up for DM care   Contact information:   [place] [address] [phone]       Signed: Ellwood Handler 11/06/2015, 8:04 AM

## 2015-11-06 LAB — GLUCOSE, CAPILLARY: Glucose-Capillary: 145 mg/dL — ABNORMAL HIGH (ref 65–99)

## 2015-11-06 MED ORDER — OXYCODONE HCL 5 MG PO TABS: 5.0000 mg | ORAL_TABLET | ORAL | Status: DC | PRN

## 2015-11-06 MED ORDER — METOPROLOL TARTRATE 50 MG PO TABS
50.0000 mg | ORAL_TABLET | Freq: Two times a day (BID) | ORAL | Status: DC
  Administered 2015-11-06: 50 mg via ORAL
  Filled 2015-11-06: qty 1

## 2015-11-06 MED ORDER — LISINOPRIL 5 MG PO TABS
5.0000 mg | ORAL_TABLET | Freq: Every day | ORAL | Status: DC
  Administered 2015-11-06: 5 mg via ORAL
  Filled 2015-11-06: qty 1

## 2015-11-06 MED ORDER — ATORVASTATIN CALCIUM 80 MG PO TABS: 80.0000 mg | ORAL_TABLET | Freq: Every day | ORAL | Status: DC

## 2015-11-06 MED ORDER — POTASSIUM CHLORIDE CRYS ER 20 MEQ PO TBCR: 20.0000 meq | EXTENDED_RELEASE_TABLET | Freq: Every day | ORAL | Status: DC

## 2015-11-06 MED ORDER — LISINOPRIL 5 MG PO TABS: 5.0000 mg | ORAL_TABLET | Freq: Every day | ORAL | Status: DC

## 2015-11-06 MED ORDER — METOPROLOL TARTRATE 50 MG PO TABS: 50.0000 mg | ORAL_TABLET | Freq: Two times a day (BID) | ORAL | Status: DC

## 2015-11-06 MED ORDER — ASPIRIN 325 MG PO TBEC: 325.0000 mg | DELAYED_RELEASE_TABLET | Freq: Every day | ORAL | Status: DC

## 2015-11-06 MED ORDER — FUROSEMIDE 40 MG PO TABS: 40.0000 mg | ORAL_TABLET | Freq: Every day | ORAL | Status: DC

## 2015-11-06 NOTE — Progress Notes (Signed)
Utilization review completed.  

## 2015-11-06 NOTE — Progress Notes (Signed)
CARDIAC REHAB PHASE I   PRE:  Rate/Rhythm: 96 SR  BP:  Supine:   Sitting: 133/76  Standing:    SaO2: 93%RA  MODE:  Ambulation: 350 ft   POST:  Rate/Rhythm: 115  BP:  Supine:   Sitting: 144/75  Standing:    SaO2: 91%RA 0920-1020 Pt walked 350 ft holding to pillow independently. Tolerated well but tired by end of walk. Education completed with pt who voiced understanding. Encouraged IS. Gave pt diabetic and heart healthy diets since A1C at 6.8. Pt stated husband is diabetic and she had gestational diabetes so has some knowledge of carbs. Gave fake cigarette and smoking cessation handout. To quit cold [place]. Discussed CRP 2 and referring to [facility]. Pt has watched discharge video. Gave emotional support as pt having a hard time with all the information and knowing changes need to be made.   Graylon Good, RN BSN  11/06/2015 10:17 AM

## 2015-11-06 NOTE — Progress Notes (Addendum)
      [address]       [place] [address]             [phone]      4 Days Post-Op Procedure(s) (LRB): CORONARY ARTERY BYPASS GRAFTING (CABG)x 1 using left internal mammary artery. (N/A) TRANSESOPHAGEAL ECHOCARDIOGRAM (TEE) (N/A)   Subjective:  Ms. Srinivasan complains of being a little bit sore.  She is ready to go home.  +ambulation  + BM  Objective: Vital signs in last 24 hours: Temp:  [98.1 F (36.7 C)] 98.1 F (36.7 C) (01/20 0400) Pulse Rate:  [84-104] 96 (01/20 0400) Cardiac Rhythm:  [-] Normal sinus rhythm (01/20 0035) Resp:  [15] 15 (01/19 2037) BP: (105-134)/(50-72) 130/69 mmHg (01/20 0400) SpO2:  [93 %] 93 % (01/20 0400) Weight:  [227 lb 4.7 oz (103.1 kg)-228 lb 13.4 oz (103.8 kg)] 227 lb 4.7 oz (103.1 kg) (01/20 0400)  Intake/Output from previous day: 01/19 0701 - 01/20 0700 In: 1080 [P.O.:1080] Out: 1600 [Urine:1000; Stool:600]  General appearance: alert, cooperative and no distress Heart: regular rate and rhythm Lungs: clear to auscultation bilaterally Abdomen: soft, non-tender; bowel sounds normal; no masses,  no organomegaly Extremities: edema trace Wound: clean and dry  Lab Results:  Recent Labs  11/04/15 0441 11/05/15 0244  WBC 13.4* 11.6*  HGB 9.2* 8.7*  HCT 28.2* 27.1*  PLT 158 175   BMET:  Recent Labs  11/04/15 0441 11/05/15 0244  NA 139 139  K 3.9 4.0  CL 107 106  CO2 26 26  GLUCOSE 117* 120*  BUN 7 8  CREATININE 0.86 0.89  CALCIUM 8.0* 8.2*    PT/INR: No results for input(s): LABPROT, INR in the last 72 hours. ABG    Component Value Date/Time   PHART 7.335* 11/02/2015 1656   HCO3 21.4 11/02/2015 1656   TCO2 24 11/03/2015 1724   ACIDBASEDEF 4.0* 11/02/2015 1656   O2SAT 96.0 11/02/2015 1656   CBG (last 3)   Recent Labs  11/05/15 1628 11/05/15 2203 11/06/15 0622  GLUCAP 147* 151* 145*    Assessment/Plan: S/P Procedure(s) (LRB): CORONARY ARTERY BYPASS GRAFTING (CABG)x 1 using left internal  mammary artery. (N/A) TRANSESOPHAGEAL ECHOCARDIOGRAM (TEE) (N/A)  1. CV- Sinus Tach- rate in the 100s, will increase Lopressor 50 mg BID and decrease Lisinopril to 5 mg daily 2. Pulm- no acute issues, continue IS 3. Renal- creatinine has been WNL, weight continues to trend down continue Lasix 4. DM- sugars controlled, new diabetic, plans to follow up with PCP 5. Dispo- patient stable, will d/c home today   LOS: 8 days    BARRETT, ERIN 11/06/2015  Patient seen and examined, agree with above  Remo Lipps C. Roxan Hockey, MD [facility] [phone]

## 2015-11-06 NOTE — Care Management Note (Signed)
Case Management Note Previous CM note initiated by [facility] RN, CM  Patient Details  Name: Brenda Cruz MRN: [id number] Date of Birth: Mar 15, 1966  Subjective/Objective:      Pt lives with family, states her husband and her mother have made arrangements so that someone will be with her 24/7 when she is medically stable for discharge.                      Expected Discharge Plan:  Home/Self Care  Discharge planning Services  CM Consult  Status of Service:  Completed, signed off   Action/Plan: Pt s/p CABG, Plan for d/c home with family   Expected Discharge Date:    11/06/15             Expected Discharge Plan:  Home/Self Care  In-House Referral:   N/A  Discharge planning Services  CM Consult  Post Acute Care Choice:   N/A Choice offered to:     DME Arranged:   N/A DME Agency:     HH Arranged:   N/A HH Agency:     Status of Service:  Completed, signed off  Medicare Important Message Given:    Date Medicare IM Given:    Medicare IM give by:    Date Additional Medicare IM Given:    Additional Medicare Important Message give by:     If discussed at [place] of Stay Meetings, dates discussed:    Discharge Disposition: Home/Self Care   Additional Comments:  Dawayne Patricia, RN 11/06/2015, 10:35 AM

## 2015-11-09 ENCOUNTER — Telehealth: Payer: Self-pay | Admitting: Cardiovascular Disease

## 2015-11-09 NOTE — Telephone Encounter (Signed)
Brenda Cruz is calling because she is having some issues , just had a CABG on 11/02/15 and went home on Friday , Blood pressure is going up and down and pulse is not getting above 60. Also she is having some fluttering .  Has had a total of 9lbs weight loss in 2 days , weak .   Please call   Thanks

## 2015-11-09 NOTE — Telephone Encounter (Signed)
Spoke with pt she is worried as her BP has been low for past two days since being home and she is lightheaded and weak. Pt stated she has lost a lot of weight over the past few days and it is really worriing her. Pt stated she is not eating very much because it make her belch and she does not like how it feels when that happens. Pt is also drinking minimal fluids as she does not have interest in food or drink.  Pt stated she very anxious and worried something is wrong. Reviewed pt medications. Pt also encouraged to eat to keep up her strength and blood sugar. Pt state BP this morning was 98/74 HR 44 and HR has not been above 60 since being home. Asked pt to sit for 10 minutes and check BP - it was 105/80 HR 83 while on phone.  Reviewed pt concerns with R. Barrett, PA and she gave verbal orders pt can: Take Lasix 40 mg by mouth PRN for swelling; Decrease Lopressor to 25 mg by mouth BID as long as HR is greater than 60.  Reviewed with pt and agreed to try changes as it could help her. Pt going to call our office with an update on Friday 1/27. Pt verbalized understanding no addtional questions at this time.

## 2015-11-15 ENCOUNTER — Encounter (HOSPITAL_COMMUNITY): Payer: Self-pay | Admitting: Emergency Medicine

## 2015-11-15 ENCOUNTER — Inpatient Hospital Stay (HOSPITAL_COMMUNITY)
Admission: EM | Admit: 2015-11-15 | Discharge: 2015-11-21 | DRG: 871 | Disposition: A | Discharge status: Home / Self Care | Payer: Managed Care, Other (non HMO) | Attending: Internal Medicine | Admitting: Internal Medicine

## 2015-11-15 ENCOUNTER — Inpatient Hospital Stay (HOSPITAL_COMMUNITY): Payer: Managed Care, Other (non HMO)

## 2015-11-15 ENCOUNTER — Emergency Department (HOSPITAL_COMMUNITY): Payer: Managed Care, Other (non HMO)

## 2015-11-15 DIAGNOSIS — E785 Hyperlipidemia, unspecified: Secondary | ICD-10-CM | POA: Diagnosis present

## 2015-11-15 DIAGNOSIS — I2699 Other pulmonary embolism without acute cor pulmonale: Secondary | ICD-10-CM | POA: Diagnosis present

## 2015-11-15 DIAGNOSIS — N179 Acute kidney failure, unspecified: Secondary | ICD-10-CM | POA: Diagnosis present

## 2015-11-15 DIAGNOSIS — Z881 Allergy status to other antibiotic agents status: Secondary | ICD-10-CM

## 2015-11-15 DIAGNOSIS — Z882 Allergy status to sulfonamides status: Secondary | ICD-10-CM

## 2015-11-15 DIAGNOSIS — Z91041 Radiographic dye allergy status: Secondary | ICD-10-CM | POA: Diagnosis not present

## 2015-11-15 DIAGNOSIS — K219 Gastro-esophageal reflux disease without esophagitis: Secondary | ICD-10-CM | POA: Diagnosis present

## 2015-11-15 DIAGNOSIS — Z981 Arthrodesis status: Secondary | ICD-10-CM | POA: Diagnosis not present

## 2015-11-15 DIAGNOSIS — Z6832 Body mass index (BMI) 32.0-32.9, adult: Secondary | ICD-10-CM

## 2015-11-15 DIAGNOSIS — D649 Anemia, unspecified: Secondary | ICD-10-CM | POA: Diagnosis present

## 2015-11-15 DIAGNOSIS — J9811 Atelectasis: Secondary | ICD-10-CM | POA: Diagnosis present

## 2015-11-15 DIAGNOSIS — R079 Chest pain, unspecified: Secondary | ICD-10-CM | POA: Diagnosis not present

## 2015-11-15 DIAGNOSIS — Z880 Allergy status to penicillin: Secondary | ICD-10-CM

## 2015-11-15 DIAGNOSIS — Z951 Presence of aortocoronary bypass graft: Secondary | ICD-10-CM | POA: Diagnosis not present

## 2015-11-15 DIAGNOSIS — D696 Thrombocytopenia, unspecified: Secondary | ICD-10-CM | POA: Diagnosis present

## 2015-11-15 DIAGNOSIS — K5900 Constipation, unspecified: Secondary | ICD-10-CM | POA: Diagnosis present

## 2015-11-15 DIAGNOSIS — I2510 Atherosclerotic heart disease of native coronary artery without angina pectoris: Secondary | ICD-10-CM | POA: Diagnosis present

## 2015-11-15 DIAGNOSIS — D7582 Heparin induced thrombocytopenia (HIT): Secondary | ICD-10-CM | POA: Diagnosis present

## 2015-11-15 DIAGNOSIS — J45909 Unspecified asthma, uncomplicated: Secondary | ICD-10-CM | POA: Diagnosis present

## 2015-11-15 DIAGNOSIS — J90 Pleural effusion, not elsewhere classified: Secondary | ICD-10-CM | POA: Diagnosis present

## 2015-11-15 DIAGNOSIS — D509 Iron deficiency anemia, unspecified: Secondary | ICD-10-CM | POA: Diagnosis present

## 2015-11-15 DIAGNOSIS — Z7982 Long term (current) use of aspirin: Secondary | ICD-10-CM

## 2015-11-15 DIAGNOSIS — F329 Major depressive disorder, single episode, unspecified: Secondary | ICD-10-CM | POA: Diagnosis present

## 2015-11-15 DIAGNOSIS — M79669 Pain in unspecified lower leg: Secondary | ICD-10-CM

## 2015-11-15 DIAGNOSIS — F17200 Nicotine dependence, unspecified, uncomplicated: Secondary | ICD-10-CM | POA: Diagnosis present

## 2015-11-15 DIAGNOSIS — A419 Sepsis, unspecified organism: Principal | ICD-10-CM | POA: Diagnosis present

## 2015-11-15 DIAGNOSIS — I10 Essential (primary) hypertension: Secondary | ICD-10-CM | POA: Diagnosis present

## 2015-11-15 DIAGNOSIS — R0609 Other forms of dyspnea: Secondary | ICD-10-CM

## 2015-11-15 DIAGNOSIS — Y95 Nosocomial condition: Secondary | ICD-10-CM | POA: Diagnosis present

## 2015-11-15 DIAGNOSIS — J189 Pneumonia, unspecified organism: Secondary | ICD-10-CM | POA: Diagnosis present

## 2015-11-15 DIAGNOSIS — R509 Fever, unspecified: Secondary | ICD-10-CM | POA: Diagnosis not present

## 2015-11-15 DIAGNOSIS — Z79899 Other long term (current) drug therapy: Secondary | ICD-10-CM | POA: Diagnosis not present

## 2015-11-15 DIAGNOSIS — F411 Generalized anxiety disorder: Secondary | ICD-10-CM | POA: Diagnosis present

## 2015-11-15 DIAGNOSIS — I2583 Coronary atherosclerosis due to lipid rich plaque: Secondary | ICD-10-CM | POA: Diagnosis present

## 2015-11-15 DIAGNOSIS — E875 Hyperkalemia: Secondary | ICD-10-CM | POA: Diagnosis present

## 2015-11-15 DIAGNOSIS — E860 Dehydration: Secondary | ICD-10-CM | POA: Diagnosis present

## 2015-11-15 DIAGNOSIS — I82432 Acute embolism and thrombosis of left popliteal vein: Secondary | ICD-10-CM | POA: Diagnosis present

## 2015-11-15 DIAGNOSIS — J9601 Acute respiratory failure with hypoxia: Secondary | ICD-10-CM | POA: Diagnosis present

## 2015-11-15 DIAGNOSIS — R0602 Shortness of breath: Secondary | ICD-10-CM

## 2015-11-15 DIAGNOSIS — I82409 Acute embolism and thrombosis of unspecified deep veins of unspecified lower extremity: Secondary | ICD-10-CM | POA: Diagnosis not present

## 2015-11-15 DIAGNOSIS — F32A Depression, unspecified: Secondary | ICD-10-CM | POA: Diagnosis present

## 2015-11-15 DIAGNOSIS — I82412 Acute embolism and thrombosis of left femoral vein: Secondary | ICD-10-CM | POA: Diagnosis present

## 2015-11-15 DIAGNOSIS — I82442 Acute embolism and thrombosis of left tibial vein: Secondary | ICD-10-CM | POA: Diagnosis present

## 2015-11-15 DIAGNOSIS — R000 Tachycardia, unspecified: Secondary | ICD-10-CM | POA: Diagnosis not present

## 2015-11-15 LAB — COMPREHENSIVE METABOLIC PANEL
ALK PHOS: 75 U/L (ref 38–126)
ALT: 17 U/L (ref 14–54)
ANION GAP: 16 — AB (ref 5–15)
AST: 24 U/L (ref 15–41)
Albumin: 3.5 g/dL (ref 3.5–5.0)
BUN: 12 mg/dL (ref 6–20)
CALCIUM: 9.7 mg/dL (ref 8.9–10.3)
CHLORIDE: 97 mmol/L — AB (ref 101–111)
CO2: 22 mmol/L (ref 22–32)
Creatinine, Ser: 1.13 mg/dL — ABNORMAL HIGH (ref 0.44–1.00)
GFR calc non Af Amer: 57 mL/min — ABNORMAL LOW (ref >60)
GLUCOSE: 174 mg/dL — AB (ref 65–99)
Potassium: 5 mmol/L (ref 3.5–5.1)
SODIUM: 135 mmol/L (ref 135–145)
Total Bilirubin: 0.9 mg/dL (ref 0.3–1.2)
Total Protein: 7.9 g/dL (ref 6.5–8.1)

## 2015-11-15 LAB — I-STAT CG4 LACTIC ACID, ED
LACTIC ACID, VENOUS: 1.83 mmol/L (ref 0.5–2.0)
Lactic Acid, Venous: 3.44 mmol/L (ref 0.5–2.0)

## 2015-11-15 LAB — CBC WITH DIFFERENTIAL/PLATELET
Basophils Absolute: 0 10*3/uL (ref 0.0–0.1)
Basophils Relative: 0 %
Eosinophils Absolute: 0.1 10*3/uL (ref 0.0–0.7)
Eosinophils Relative: 1 %
HEMATOCRIT: 34 % — AB (ref 36.0–46.0)
HEMOGLOBIN: 10.8 g/dL — AB (ref 12.0–15.0)
LYMPHS ABS: 1.7 10*3/uL (ref 0.7–4.0)
Lymphocytes Relative: 8 %
MCH: 28.7 pg (ref 26.0–34.0)
MCHC: 31.8 g/dL (ref 30.0–36.0)
MCV: 90.4 fL (ref 78.0–100.0)
MONO ABS: 2.1 10*3/uL — AB (ref 0.1–1.0)
MONOS PCT: 10 %
NEUTROS ABS: 17.8 10*3/uL — AB (ref 1.7–7.7)
NEUTROS PCT: 81 %
PLATELETS: 208 10*3/uL (ref 150–400)
RBC: 3.76 MIL/uL — ABNORMAL LOW (ref 3.87–5.11)
RDW: 13.1 % (ref 11.5–15.5)
WBC: 21.7 10*3/uL — ABNORMAL HIGH (ref 4.0–10.5)

## 2015-11-15 LAB — I-STAT TROPONIN, ED: TROPONIN I, POC: 0.02 ng/mL (ref 0.00–0.08)

## 2015-11-15 LAB — I-STAT BETA HCG BLOOD, ED (MC, WL, AP ONLY): I-stat hCG, quantitative: <5 m[IU]/mL (ref <5)

## 2015-11-15 MED ORDER — DEXTROSE 5 % IV SOLN
1.0000 g | Freq: Three times a day (TID) | INTRAVENOUS | Status: DC
  Administered 2015-11-15 – 2015-11-19 (×11): 1 g via INTRAVENOUS
  Filled 2015-11-15 (×15): qty 1

## 2015-11-15 MED ORDER — ENOXAPARIN SODIUM 40 MG/0.4ML ~~LOC~~ SOLN
40.0000 mg | SUBCUTANEOUS | Status: DC
  Administered 2015-11-15: 40 mg via SUBCUTANEOUS
  Filled 2015-11-15: qty 0.4

## 2015-11-15 MED ORDER — METOPROLOL TARTRATE 25 MG PO TABS
25.0000 mg | ORAL_TABLET | Freq: Two times a day (BID) | ORAL | Status: DC
  Administered 2015-11-15 – 2015-11-21 (×12): 25 mg via ORAL
  Filled 2015-11-15 (×3): qty 1
  Filled 2015-11-15: qty 2
  Filled 2015-11-15 (×9): qty 1

## 2015-11-15 MED ORDER — MORPHINE SULFATE (PF) 2 MG/ML IV SOLN
2.0000 mg | INTRAVENOUS | Status: DC | PRN
  Administered 2015-11-15: 2 mg via INTRAVENOUS
  Filled 2015-11-15: qty 1

## 2015-11-15 MED ORDER — HYDRALAZINE HCL 20 MG/ML IJ SOLN: 5.0000 mg | INTRAMUSCULAR | Status: DC | PRN

## 2015-11-15 MED ORDER — ATORVASTATIN CALCIUM 80 MG PO TABS: 80.0000 mg | ORAL_TABLET | Freq: Every day | ORAL | Status: DC

## 2015-11-15 MED ORDER — LEVALBUTEROL HCL 1.25 MG/0.5ML IN NEBU
1.2500 mg | INHALATION_SOLUTION | Freq: Four times a day (QID) | RESPIRATORY_TRACT | Status: DC
  Filled 2015-11-15: qty 0.5

## 2015-11-15 MED ORDER — SODIUM CHLORIDE 0.9 % IV BOLUS (SEPSIS)
1000.0000 mL | Freq: Once | INTRAVENOUS | Status: AC
  Administered 2015-11-15: 1000 mL via INTRAVENOUS

## 2015-11-15 MED ORDER — LORAZEPAM 1 MG PO TABS
1.0000 mg | ORAL_TABLET | Freq: Two times a day (BID) | ORAL | Status: DC | PRN
  Administered 2015-11-15 – 2015-11-20 (×5): 1 mg via ORAL
  Filled 2015-11-15 (×5): qty 1

## 2015-11-15 MED ORDER — VANCOMYCIN HCL 10 G IV SOLR
1250.0000 mg | Freq: Two times a day (BID) | INTRAVENOUS | Status: AC
  Administered 2015-11-15 – 2015-11-17 (×5): 1250 mg via INTRAVENOUS
  Filled 2015-11-15 (×6): qty 1250

## 2015-11-15 MED ORDER — SODIUM CHLORIDE 0.9 % IV SOLN
INTRAVENOUS | Status: DC
  Administered 2015-11-15 – 2015-11-17 (×2): via INTRAVENOUS

## 2015-11-15 MED ORDER — PANTOPRAZOLE SODIUM 40 MG PO TBEC
40.0000 mg | DELAYED_RELEASE_TABLET | Freq: Every day | ORAL | Status: DC
  Administered 2015-11-15 – 2015-11-21 (×7): 40 mg via ORAL
  Filled 2015-11-15 (×7): qty 1

## 2015-11-15 MED ORDER — FLUOXETINE HCL 20 MG PO CAPS
20.0000 mg | ORAL_CAPSULE | Freq: Every day | ORAL | Status: DC
  Administered 2015-11-16 – 2015-11-21 (×6): 20 mg via ORAL
  Filled 2015-11-15 (×6): qty 1

## 2015-11-15 MED ORDER — DIPHENHYDRAMINE HCL 50 MG/ML IJ SOLN: 25.0000 mg | Freq: Three times a day (TID) | INTRAMUSCULAR | Status: DC | PRN

## 2015-11-15 MED ORDER — MORPHINE SULFATE (PF) 4 MG/ML IV SOLN
4.0000 mg | Freq: Once | INTRAVENOUS | Status: AC
  Administered 2015-11-15: 4 mg via INTRAVENOUS
  Filled 2015-11-15: qty 1

## 2015-11-15 MED ORDER — OXYCODONE HCL 5 MG PO TABS
5.0000 mg | ORAL_TABLET | ORAL | Status: DC | PRN
  Administered 2015-11-15 – 2015-11-18 (×12): 10 mg via ORAL
  Filled 2015-11-15 (×13): qty 2

## 2015-11-15 MED ORDER — ACETAMINOPHEN 325 MG PO TABS
650.0000 mg | ORAL_TABLET | Freq: Four times a day (QID) | ORAL | Status: DC | PRN
  Administered 2015-11-15 – 2015-11-21 (×4): 650 mg via ORAL
  Filled 2015-11-15 (×4): qty 2

## 2015-11-15 MED ORDER — ASPIRIN EC 325 MG PO TBEC: 325.0000 mg | DELAYED_RELEASE_TABLET | Freq: Every day | ORAL | Status: DC

## 2015-11-15 MED ORDER — PANTOPRAZOLE SODIUM 40 MG PO TBEC: 40.0000 mg | DELAYED_RELEASE_TABLET | Freq: Every day | ORAL | Status: DC

## 2015-11-15 MED ORDER — ASPIRIN EC 325 MG PO TBEC
325.0000 mg | DELAYED_RELEASE_TABLET | Freq: Every day | ORAL | Status: DC
  Administered 2015-11-16 – 2015-11-18 (×3): 325 mg via ORAL
  Filled 2015-11-15 (×3): qty 1

## 2015-11-15 MED ORDER — MORPHINE SULFATE (PF) 4 MG/ML IV SOLN
4.0000 mg | Freq: Once | INTRAVENOUS | Status: AC
Start: 2015-11-15 — End: 2015-11-15
  Administered 2015-11-15: 4 mg via INTRAVENOUS
  Filled 2015-11-15: qty 1

## 2015-11-15 MED ORDER — DM-GUAIFENESIN ER 30-600 MG PO TB12
1.0000 | ORAL_TABLET | Freq: Two times a day (BID) | ORAL | Status: DC
  Administered 2015-11-15 – 2015-11-21 (×12): 1 via ORAL
  Filled 2015-11-15 (×12): qty 1

## 2015-11-15 MED ORDER — SIMETHICONE 80 MG PO CHEW
80.0000 mg | CHEWABLE_TABLET | Freq: Four times a day (QID) | ORAL | Status: DC | PRN
  Administered 2015-11-15 – 2015-11-21 (×2): 80 mg via ORAL
  Filled 2015-11-15 (×2): qty 1

## 2015-11-15 MED ORDER — DIAZEPAM 5 MG/ML IJ SOLN
2.5000 mg | Freq: Once | INTRAMUSCULAR | Status: AC
  Administered 2015-11-15: 2.5 mg via INTRAVENOUS
  Filled 2015-11-15: qty 2

## 2015-11-15 NOTE — H&P (Addendum)
[facility] History and Physical  Brenda Cruz [id number] DOB: 31-Mar-1966 DOA: 11/15/2015  Referring physician: ED physician PCP: Pcp Not In System  Specialists:   Chief Complaint: Right flank and R lower chest pain, shortness breath, fever  HPI: Brenda Cruz is a 50 y.o. female with PMH of CAD, s/p of CABG 11/02/15 by Dr. Roxan Hockey, hyperlipidemia, asthma, GERD, depression, anxiety, DDD, TMG, who presents with right sided flank pain and chest pain, shortness of breath and fever.  Patient was recently hospitalized from 01/12-1/20/17 and had CABG done by Dr. Roxan Hockey on 11/02/15. She has been doing fine utile yesterday, when she started having shortness and right flank pain and R lower chest pain. It is severe, constant, sharp, pleuritic. It is aggravated by coughing or deep breath. Patient has mild cough with small amount dark colored sputum production. She did not have fever at home, but was found to have temperature 101.2 in the emergency room. No chills. Patient reports that she had tenderness over left calf area two days ago, which has gone today. Patient does not have abdominal pain, diarrhea, symptoms of a UTI or unilateral weakness.  In ED, patient was found to have WBC 21.7, lactate 1.83-->3.44, negative troponin, negative pregnancy test, temperature 11.2, tachycardia, tachypnea, acute renal injury. CT-chest showed bilateral lower lobe atelectasis versus infiltrates and small right pleural effusion, but no evidence of mediastinal hematoma or evidence of pericardial effusion. Patient is admitted to inpatient for further eval and treatment. EDP consulted on-call cardiothoracic surgeon.  EKG: Independently reviewed. QT 445, mild T-wave inversion in lateral leads.  Where does patient live?   At home   Can patient participate in ADLs?  Yes   Review of Systems:   General: has fevers, no chills, no changes in body weight, has fatigue HEENT: no blurry vision, hearing  changes or sore throat Pulm: has dyspnea, coughing, no wheezing CV: has chest pain, no palpitations Abd: no nausea, vomiting, abdominal pain, diarrhea, constipation GU: no dysuria, burning on urination, increased urinary frequency, hematuria  Ext: no leg edema Neuro: no unilateral weakness, numbness, or tingling, no vision change or hearing loss Skin: no rash MSK: No muscle spasm, no deformity, no limitation of range of movement in spin Heme: No easy bruising.  Travel history: No recent long distant travel.  Allergy:  Allergies  Allergen Reactions  . Atorvastatin Other (See Comments)    myalgias  . Iohexol Itching, Rash and Other (See Comments)     Code: RASH, Desc: White blisters in mouth during ivp in [facility] '93, ok w/ 13 hour prep today//a.calhoun, Onset Date: [id number]   . Penicillins Other (See Comments)    Has patient had a PCN reaction causing immediate rash, facial/tongue/throat swelling, SOB or lightheadedness with hypotension: No Has patient had a PCN reaction causing severe rash involving mucus membranes or skin necrosis: Yes Has patient had a PCN reaction that required hospitalization No Has patient had a PCN reaction occurring within the last 10 years: No If all of the above answers are "NO", then may proceed with Cephalosporin use.  . Sulfa Antibiotics Itching and Rash  . Zithromax [Azithromycin] Itching and Rash    Past Medical History  Diagnosis Date  . Asthma   . GERD (gastroesophageal reflux disease)   . DDD (degenerative disc disease)   . Hyperlipemia   . Anxiety   . Hemorrhoids   . TMJ (temporomandibular joint disorder)     Past Surgical History  Procedure Laterality Date  . Cholecystectomy    .  Lumbar fusion    . Cardiac catheterization N/A 10/29/2015    Procedure: Left Heart Cath and Coronary Angiography;  Surgeon: [name], MD;  Location: [facility];  Service: Cardiovascular;  Laterality: N/A;  . Coronary artery bypass graft  N/A 11/02/2015    Procedure: CORONARY ARTERY BYPASS GRAFTING (CABG)x 1 using left internal mammary artery.;  Surgeon: Melrose Nakayama, MD;  Location: [facility];  Service: Open Heart Surgery;  Laterality: N/A;  . Tee without cardioversion N/A 11/02/2015    Procedure: TRANSESOPHAGEAL ECHOCARDIOGRAM (TEE);  Surgeon: Melrose Nakayama, MD;  Location: [facility];  Service: Open Heart Surgery;  Laterality: N/A;    Social History:  reports that she has been smoking.  She has never used smokeless tobacco. She reports that she does not drink alcohol or use illicit drugs.  Family History:  Family History  Problem Relation Age of Onset  . Colon cancer Neg Hx   . Colon polyps Father   . Stomach cancer Paternal Grandmother   . Esophageal cancer Maternal Grandfather   . Breast cancer Mother   . Ovarian cancer      Maternal Side Great  Aunt     Prior to Admission medications   Medication Sig Start Date End Date Taking? Authorizing Provider  acetaminophen (TYLENOL) 500 MG tablet Take 1,000 mg by mouth every 6 (six) hours as needed for moderate pain.   Yes Historical Provider, MD  albuterol (PROVENTIL HFA;VENTOLIN HFA) 108 (90 BASE) MCG/ACT inhaler Inhale 2 puffs into the lungs every 6 (six) hours as needed for wheezing or shortness of breath. 07/02/14  Yes Sharion Balloon, [name]  aspirin EC 325 MG EC tablet Take 1 tablet (325 mg total) by mouth daily. 11/06/15  Yes Erin R Barrett, PA-C  esomeprazole (NEXIUM) 40 MG capsule TAKE ONE (1) CAPSULE EACH DAY 10/16/15  Yes [name] [name], [name]  FLUoxetine (PROZAC) 20 MG capsule TAKE ONE (1) CAPSULE EACH DAY 10/16/15  Yes [name] [name], [name]  lisinopril (PRINIVIL,ZESTRIL) 5 MG tablet Take 1 tablet (5 mg total) by mouth daily. 11/06/15  Yes Erin R Barrett, PA-C  LORazepam (ATIVAN) 1 MG tablet TAKE 1 TABLET TWICE DAILY AS NEEDED FOR ANXIETY 10/16/15  Yes [name] [name], [name]  metoprolol (LOPRESSOR) 50 MG tablet Take 1 tablet (50 mg total) by mouth 2 (two)  times daily. Patient taking differently: Take 25 mg by mouth 2 (two) times daily. HOLD IF PULSE <60 11/06/15  Yes Erin R Barrett, PA-C  oxyCODONE (OXY IR/ROXICODONE) 5 MG immediate release tablet Take 1-2 tablets (5-10 mg total) by mouth every 3 (three) hours as needed for severe pain. 11/06/15  Yes Erin R Barrett, PA-C  simethicone (MYLICON) 0000000 MG chewable tablet Chew 125-250 mg by mouth every 6 (six) hours as needed for flatulence.   Yes Historical Provider, MD  traMADol (ULTRAM) 50 MG tablet TAKE 1 TABLET 3 TIMES DAILY AS NEEDED FOR PAIN 03/02/15  Yes [name] [name], [name]  atorvastatin (LIPITOR) 80 MG tablet Take 1 tablet (80 mg total) by mouth daily at 6 PM. 11/06/15   Erin R Barrett, PA-C  furosemide (LASIX) 40 MG tablet Take 1 tablet (40 mg total) by mouth daily. For 7 Days 11/06/15   Erin R Barrett, PA-C  potassium chloride SA (K-DUR,KLOR-CON) 20 MEQ tablet Take 1 tablet (20 mEq total) by mouth daily. For 7 Days 11/06/15   Freddrick March, PA-C    Physical Exam: Filed Vitals:   11/15/15 1930 11/15/15 1945 11/15/15 2000 11/15/15  2145  BP: 122/70 128/77 127/66 113/53  Pulse: 100 105 104 108  Temp:      TempSrc:      Resp: 29 27 26    Height:      Weight:      SpO2: 98% 99% 99% 97%   General: Not in acute distress HEENT:       Eyes: PERRL, EOMI, no scleral icterus.       ENT: No discharge from the ears and nose, no pharynx injection, no tonsillar enlargement.        Neck: No JVD, no bruit, no mass felt. Heme: No neck lymph node enlargement. Cardiac: S1/S2, RRR, tachycardia, No murmurs, No gallops or rubs. Pulm:  No rales, wheezing, rhonchi or rubs. Abd: Soft, nondistended, nontender, no rebound pain, no organomegaly, BS present. Ext: No pitting leg edema bilaterally. 2+DP/PT pulse bilaterally. Musculoskeletal: No joint deformities, No joint redness or warmth, no limitation of ROM in spin. Skin: No rashes.  Neuro: Alert, oriented X3, cranial nerves II-XII grossly intact, moves all  extremities. Psych: Patient is not psychotic, no suicidal or hemocidal ideation.  Labs on Admission:  Basic Metabolic Panel:  Recent Labs Lab 11/15/15 1836  NA 135  K 5.0  CL 97*  CO2 22  GLUCOSE 174*  BUN 12  CREATININE 1.13*  CALCIUM 9.7   Liver Function Tests:  Recent Labs Lab 11/15/15 1836  AST 24  ALT 17  ALKPHOS 75  BILITOT 0.9  PROT 7.9  ALBUMIN 3.5   No results for input(s): LIPASE, AMYLASE in the last 168 hours. No results for input(s): AMMONIA in the last 168 hours. CBC:  Recent Labs Lab 11/15/15 1836  WBC 21.7*  NEUTROABS 17.8*  HGB 10.8*  HCT 34.0*  MCV 90.4  PLT 208   Cardiac Enzymes: No results for input(s): CKTOTAL, CKMB, CKMBINDEX, TROPONINI in the last 168 hours.  BNP (last 3 results)  Recent Labs  10/29/15 0952  BNP 11.4    ProBNP (last 3 results) No results for input(s): PROBNP in the last 8760 hours.  CBG: No results for input(s): GLUCAP in the last 168 hours.  Radiological Exams on Admission: Dg Chest 2 View  11/15/2015  CLINICAL DATA:  Right-sided chest tightness and shortness of breath EXAM: CHEST  2 VIEW COMPARISON:  11/04/2015 FINDINGS: Streaky and linear opacity in the lung bases is probably related atelectasis although pneumonia medial lung bases cannot be entirely excluded. Opacity seen posteriorly in the costophrenic sulcus on the lateral film. Lung volumes are low. Patient is status post median sternotomy. The visualized bony structures of the thorax are intact. IMPRESSION: Interval decrease in volume with interval development of bibasilar collapse/ consolidation. Electronically Signed   By: Misty Stanley M.D.   On: 11/15/2015 17:50   Ct Chest Wo Contrast  11/15/2015  CLINICAL DATA:  Shortness of breath. Right-sided back pain. Pneumonia. One week postop from CABG. EXAM: CT CHEST WITHOUT CONTRAST TECHNIQUE: Multidetector CT imaging of the chest was performed following the standard protocol without IV contrast. COMPARISON:   10/13/2011 FINDINGS: Mediastinum/Lymph Nodes: No masses or pathologically enlarged lymph nodes identified on this un-enhanced exam. No evidence of mediastinal hematoma. Recent postop changes from CABG noted. No evidence of pericardial effusion. Lungs/Pleura: Bilateral lower lobe atelectasis or infiltrates are seen. Small right pleural effusion noted. No evidence of pneumothorax. Upper abdomen: No acute findings. Musculoskeletal: No chest wall mass or suspicious bone lesions identified. IMPRESSION: Bilateral lower lobe atelectasis versus infiltrates and small right pleural effusion. Electronically Signed  By: Earle Gell M.D.   On: 11/15/2015 20:31    Assessment/Plan Principal Problem:   HCAP (healthcare-associated pneumonia) Active Problems:   GERD (gastroesophageal reflux disease)   Depression   GAD (generalized anxiety disorder)   Essential hypertension   Morbid obesity (HCC)   Coronary artery disease due to lipid rich plaque   S/P CABG x 1   Sepsis (HCC)   Fever in adult   AKI (acute kidney injury) ([place])   HCAP (healthcare-associated pneumonia): Patient's productive cough, fever, leukocytosis, shortness and chest pain are most likely caused by HCAP. CT-chest showed possible bilateral lower lobe infiltrates though atelectasis cannot be completely ruled out. A potential differential diagnosis is poor embolism. Patient had a recent CABG surgery, which increases the possibility of PE. In addition, patient reports she had tenderness over left calf area, which has gone, indicating a possible traveled DVT, therefore we need to r/o PE. Since pt has contrast allergy and acute renal injury, VQ scan would be the better course. I spoke with on-call radiologist, who agreed to do VQ scan for patient.  - Will admit to SDU given complexity of presentations and high risk of deteriorating - IV Vancomycin and cefepime per pharmacy - Mucinex for cough  - Xopenex Neb prn for SOB - Urine legionella and S.  pneumococcal antigen - Follow up blood culture x2, sputum culture, plus Flu pcr - f/u V/Q stat --LE venous Doppler to rule out DVT - pain control: When necessary when necessary oxycodone and morphine  Addendum: state V/Q scan showed Intermediate probability category (20-79%) -Will start IV heparin  -will get 2d echo in AM -will check hypercoagulable panel -change prn morphine to dilaudid for pain  Sepsis: Patient is septic on admission with leukocytosis, fever, tachycardia, tachypnea and elevated lactate level. She is hemodynamically stable currently. - will get Procalcitonin and trend lactic acid level per sepsis protocol - IVF: 3L of NS bolus in ED, followed by 75 mL per hour of NS   CAD: s/p of CABG on 11/02/15. EDP consulted on-call cardiothoracic surgeon, who did not think that pt has empyema. He will let patient surgeon, Dr. Roxan Hockey known and see patient in morning -continue aspirin, Lipitor and metoprolol -trop x 3  HTN: -Hold lisinopril due to acute renal injury. -Continue metoprolol -IV hydralazine when necessary  GERD: -Protonix  AKI: Likely due to prerenal secondary to dehydration and continuation of diruetics, AECI - IVF as above - Check FeUrea - Follow up renal function by BMP - Hold Lasix and lisinopril  Depression and anxiety: Stable, no suicidal or homicidal ideations. -Continue home medications: Prozac and Ativan  DVT ppx: SQ Lovenox  Code Status: Full code Family Communication:  Yes, patient's husband at bed side Disposition Plan: Admit to inpatient   Date of Service 11/15/2015    Ivor Costa [facility] Pager [phone]  If 7PM-7AM, please contact night-coverage [email or web address] Password [facility] 11/15/2015, 10:25 PM

## 2015-11-15 NOTE — ED Notes (Signed)
Pt from home with c/o SOB and mid right sided back pain starting yesterday.  Pt was d/c from [facility] on 11/06/15 s/p bypass surgery.  Pt in NAD, A&O.

## 2015-11-15 NOTE — Consult Note (Signed)
ANTIBIOTIC CONSULT NOTE - INITIAL  Pharmacy Consult for Vancomycin and Cefepime Indication: HCAP  Patient Measurements: Height: 5\' 8"  (172.7 cm) Weight: 210 lb (95.255 kg) IBW/kg (Calculated) : 63.9   Vital Signs: Temp: 101.2 F (38.4 C) (01/29 1915) Temp Source: Rectal (01/29 1915) BP: 121/80 mmHg (01/29 1900) Pulse Rate: 109 (01/29 1900) Intake/Output from previous day:   Intake/Output from this shift:    Labs:  Recent Labs  11/15/15 1836  WBC 21.7*  HGB 10.8*  PLT 208  CREATININE 1.13*   Estimated Creatinine Clearance: 74.3 mL/min (by C-G formula based on Cr of 1.13).  Microbiology: Recent Results (from the past 720 hour(s))  MRSA PCR Screening     Status: None   Collection Time: 11/01/15  1:32 PM  Result Value Ref Range Status   MRSA by PCR NEGATIVE NEGATIVE Final    Comment:        The GeneXpert MRSA Assay (FDA approved for NASAL specimens only), is one component of a comprehensive MRSA colonization surveillance program. It is not intended to diagnose MRSA infection nor to guide or monitor treatment for MRSA infections.   Surgical pcr screen     Status: None   Collection Time: 11/01/15  7:32 PM  Result Value Ref Range Status   MRSA, PCR NEGATIVE NEGATIVE Final   Staphylococcus aureus NEGATIVE NEGATIVE Final    Comment:        The Xpert SA Assay (FDA approved for NASAL specimens in patients over 68 years of age), is one component of a comprehensive surveillance program.  Test performance has been validated by [facility] for patients greater than or equal to 56 year old. It is not intended to diagnose infection nor to guide or monitor treatment.     Medical History: Past Medical History  Diagnosis Date  . Asthma   . GERD (gastroesophageal reflux disease)   . DDD (degenerative disc disease)   . Hyperlipemia   . Anxiety   . Hemorrhoids   . TMJ (temporomandibular joint disorder)    Assessment: 49yof s/p recent hospitalization  (1/12-1/19) for CABG presents to the ED today with SOB. CXR shows interval development of bibasilar collapse/consolidation. She will begin vancomycin and cefepime for presumable HCAP. Renal function wnl.  Goal of Therapy:  Vancomycin trough level 15-20 mcg/ml  Plan:  1) Cefepime 1g IV q8 2) Vancomycin 1250mg  IV q12 3) Follow renal function, cultures, LOT, level if needed  Deboraha Sprang 11/15/2015,7:59 PM

## 2015-11-15 NOTE — ED Notes (Signed)
Received critical lab value for a lactic acid of 3.44

## 2015-11-15 NOTE — ED Notes (Signed)
Patient transported to CT 

## 2015-11-16 ENCOUNTER — Encounter (HOSPITAL_COMMUNITY): Payer: Managed Care, Other (non HMO)

## 2015-11-16 DIAGNOSIS — Z736 Limitation of activities due to disability: Secondary | ICD-10-CM

## 2015-11-16 LAB — BLOOD GAS, ARTERIAL
ACID-BASE DEFICIT: 5.9 mmol/L — AB (ref 0.0–2.0)
BICARBONATE: 19 meq/L — AB (ref 20.0–24.0)
Drawn by: 41308
O2 Content: 0.2 L/min
O2 SAT: 93.2 %
PATIENT TEMPERATURE: 100
TCO2: 20.1 mmol/L (ref 0–100)
pCO2 arterial: 38.8 mmHg (ref 35.0–45.0)
pH, Arterial: 7.316 — ABNORMAL LOW (ref 7.350–7.450)
pO2, Arterial: 82.3 mmHg (ref 80.0–100.0)

## 2015-11-16 LAB — BASIC METABOLIC PANEL
Anion gap: 14 (ref 5–15)
BUN: 13 mg/dL (ref 6–20)
CO2: 17 mmol/L — AB (ref 22–32)
Calcium: 8.4 mg/dL — ABNORMAL LOW (ref 8.9–10.3)
Chloride: 106 mmol/L (ref 101–111)
Creatinine, Ser: 0.8 mg/dL (ref 0.44–1.00)
GFR calc Af Amer: >60 mL/min (ref >60)
GLUCOSE: 129 mg/dL — AB (ref 65–99)
POTASSIUM: 5.2 mmol/L — AB (ref 3.5–5.1)
Sodium: 137 mmol/L (ref 135–145)

## 2015-11-16 LAB — PROCALCITONIN

## 2015-11-16 LAB — URINALYSIS, ROUTINE W REFLEX MICROSCOPIC
Glucose, UA: NEGATIVE mg/dL
HGB URINE DIPSTICK: NEGATIVE
Ketones, ur: 15 mg/dL — AB
Leukocytes, UA: NEGATIVE
Nitrite: NEGATIVE
PROTEIN: NEGATIVE mg/dL
SPECIFIC GRAVITY, URINE: 1.024 (ref 1.005–1.030)
pH: 5.5 (ref 5.0–8.0)

## 2015-11-16 LAB — CBC
HCT: 28.5 % — ABNORMAL LOW (ref 36.0–46.0)
Hemoglobin: 9.3 g/dL — ABNORMAL LOW (ref 12.0–15.0)
MCH: 29.9 pg (ref 26.0–34.0)
MCHC: 32.6 g/dL (ref 30.0–36.0)
MCV: 91.6 fL (ref 78.0–100.0)
PLATELETS: 151 10*3/uL (ref 150–400)
RBC: 3.11 MIL/uL — ABNORMAL LOW (ref 3.87–5.11)
RDW: 13.5 % (ref 11.5–15.5)
WBC: 18.2 10*3/uL — ABNORMAL HIGH (ref 4.0–10.5)

## 2015-11-16 LAB — RAPID URINE DRUG SCREEN, HOSP PERFORMED
AMPHETAMINES: NOT DETECTED
BARBITURATES: NOT DETECTED
Benzodiazepines: POSITIVE — AB
Cocaine: NOT DETECTED
Opiates: POSITIVE — AB
TETRAHYDROCANNABINOL: NOT DETECTED

## 2015-11-16 LAB — INFLUENZA PANEL BY PCR (TYPE A & B)
H1N1 flu by pcr: NOT DETECTED
Influenza A By PCR: NEGATIVE
Influenza B By PCR: NEGATIVE

## 2015-11-16 LAB — ANTITHROMBIN III: ANTITHROMB III FUNC: 96 % (ref 75–120)

## 2015-11-16 LAB — LACTIC ACID, PLASMA
Lactic Acid, Venous: 0.6 mmol/L (ref 0.5–2.0)
Lactic Acid, Venous: 0.8 mmol/L (ref 0.5–2.0)

## 2015-11-16 LAB — HEPARIN LEVEL (UNFRACTIONATED)
HEPARIN UNFRACTIONATED: 0.29 [IU]/mL — AB (ref 0.30–0.70)
Heparin Unfractionated: 0.32 IU/mL (ref 0.30–0.70)

## 2015-11-16 LAB — PROTIME-INR
INR: 1.36 (ref 0.00–1.49)
PROTHROMBIN TIME: 16.9 s — AB (ref 11.6–15.2)

## 2015-11-16 LAB — TROPONIN I
TROPONIN I: 0.05 ng/mL — AB (ref <0.031)
TROPONIN I: 0.04 ng/mL — AB (ref <0.031)
Troponin I: 0.05 ng/mL — ABNORMAL HIGH (ref <0.031)

## 2015-11-16 LAB — APTT: APTT: 44 s — AB (ref 24–37)

## 2015-11-16 LAB — CREATININE, URINE, RANDOM: CREATININE, URINE: 170.58 mg/dL

## 2015-11-16 LAB — HIV ANTIBODY (ROUTINE TESTING W REFLEX): HIV Screen 4th Generation wRfx: NONREACTIVE

## 2015-11-16 MED ORDER — LEVALBUTEROL HCL 1.25 MG/0.5ML IN NEBU
1.2500 mg | INHALATION_SOLUTION | Freq: Four times a day (QID) | RESPIRATORY_TRACT | Status: DC
  Administered 2015-11-16 – 2015-11-20 (×16): 1.25 mg via RESPIRATORY_TRACT
  Filled 2015-11-16 (×16): qty 0.5

## 2015-11-16 MED ORDER — TECHNETIUM TC 99M DIETHYLENETRIAME-PENTAACETIC ACID: 31.0000 | Freq: Once | INTRAVENOUS | Status: DC | PRN

## 2015-11-16 MED ORDER — TECHNETIUM TO 99M ALBUMIN AGGREGATED
4.1000 | Freq: Once | INTRAVENOUS | Status: AC | PRN
  Administered 2015-11-16: 4 via INTRAVENOUS

## 2015-11-16 MED ORDER — HEPARIN BOLUS VIA INFUSION
2000.0000 [IU] | Freq: Once | INTRAVENOUS | Status: AC
  Administered 2015-11-16: 2000 [IU] via INTRAVENOUS
  Filled 2015-11-16: qty 2000

## 2015-11-16 MED ORDER — HYDROMORPHONE HCL 1 MG/ML IJ SOLN
1.0000 mg | INTRAMUSCULAR | Status: DC | PRN
  Administered 2015-11-16 – 2015-11-17 (×6): 1 mg via INTRAVENOUS
  Filled 2015-11-16 (×7): qty 1

## 2015-11-16 MED ORDER — HEPARIN (PORCINE) IN NACL 100-0.45 UNIT/ML-% IJ SOLN
1700.0000 [IU]/h | INTRAMUSCULAR | Status: DC
  Administered 2015-11-16: 1450 [IU]/h via INTRAVENOUS
  Administered 2015-11-16: 1250 [IU]/h via INTRAVENOUS
  Filled 2015-11-16 (×3): qty 250

## 2015-11-16 NOTE — Progress Notes (Signed)
OT Cancellation Note  Patient Details Name: Brenda Cruz MRN: [id number] DOB: 23-Feb-1966   Cancelled Treatment:    Reason Eval/Treat Not Completed: Patient not medically ready. Pt with possible diagnosis of PE. Heparin started. Nsg requesting to hold today. Will reassess appropriateness for therapy tomorrow. Thanks  Lilly, OTR/L  [phone] 11/16/2015 11/16/2015, 11:34 AM

## 2015-11-16 NOTE — Progress Notes (Signed)
PT Cancellation Note  Patient Details Name: Brenda Cruz MRN: [id number] DOB: [birth year]   Cancelled Treatment:    Reason Eval/Treat Not Completed: Medical issues which prohibited therapy (Pt with probable PE.  Just initiated Heparin early am. )Will check back tomorrow.  Thanks.   Irwin Brakeman F 11/16/2015, 12:33 PM  Macai Sisneros,PT Acute Rehabilitation [phone] (pager)

## 2015-11-16 NOTE — Progress Notes (Signed)
ANTICOAGULATION CONSULT NOTE - Initial Consult  Pharmacy Consult for heparin Indication: pulmonary embolus   Patient Measurements: Height: 5\' 8"  (172.7 cm) Weight: 210 lb (95.255 kg) IBW/kg (Calculated) : 63.9 Heparin Dosing Weight: 85kg  Vital Signs: Temp: 97.4 F (36.3 C) (01/30 0848) Temp Source: Oral (01/30 0848) BP: 132/72 mmHg (01/30 0921) Pulse Rate: 100 (01/30 0921)  Labs:  Recent Labs  11/15/15 1836 11/16/15 0114 11/16/15 0506 11/16/15 0918  HGB 10.8*  --   --  9.3*  HCT 34.0*  --   --  28.5*  PLT 208  --   --  151  APTT  --  44*  --   --   LABPROT  --  16.9*  --   --   INR  --  1.36  --   --   HEPARINUNFRC  --   --   --  0.32  CREATININE 1.13*  --   --  0.80  TROPONINI  --  0.05* 0.05* 0.04*    Estimated Creatinine Clearance: 105 mL/min (by C-G formula based on Cr of 0.8).   Medical History: Past Medical History  Diagnosis Date  . Asthma   . GERD (gastroesophageal reflux disease)   . DDD (degenerative disc disease)   . Hyperlipemia   . Anxiety   . Hemorrhoids   . TMJ (temporomandibular joint disorder)     Medications:  Prescriptions prior to admission  Medication Sig Dispense Refill Last Dose  . acetaminophen (TYLENOL) 500 MG tablet Take 1,000 mg by mouth every 6 (six) hours as needed for moderate pain.   11/15/2015 at Unknown time  . albuterol (PROVENTIL HFA;VENTOLIN HFA) 108 (90 BASE) MCG/ACT inhaler Inhale 2 puffs into the lungs every 6 (six) hours as needed for wheezing or shortness of breath. 1 Inhaler 2 11/15/2015 at Unknown time  . aspirin EC 325 MG EC tablet Take 1 tablet (325 mg total) by mouth daily. 30 tablet 0 11/15/2015 at Unknown time  . esomeprazole (NEXIUM) 40 MG capsule TAKE ONE (1) CAPSULE EACH DAY 30 capsule 4 11/15/2015 at Unknown time  . FLUoxetine (PROZAC) 20 MG capsule TAKE ONE (1) CAPSULE EACH DAY 30 capsule 2 11/15/2015 at Unknown time  . lisinopril (PRINIVIL,ZESTRIL) 5 MG tablet Take 1 tablet (5 mg total) by mouth daily. 30  tablet 3 11/15/2015 at Unknown time  . LORazepam (ATIVAN) 1 MG tablet TAKE 1 TABLET TWICE DAILY AS NEEDED FOR ANXIETY 60 tablet 1 Past Week at Unknown time  . metoprolol (LOPRESSOR) 50 MG tablet Take 1 tablet (50 mg total) by mouth 2 (two) times daily. (Patient taking differently: Take 25 mg by mouth 2 (two) times daily. HOLD IF PULSE <60) 60 tablet 3 11/15/2015 at 1000  . oxyCODONE (OXY IR/ROXICODONE) 5 MG immediate release tablet Take 1-2 tablets (5-10 mg total) by mouth every 3 (three) hours as needed for severe pain. 30 tablet 0 11/15/2015 at Unknown time  . simethicone (MYLICON) 0000000 MG chewable tablet Chew 125-250 mg by mouth every 6 (six) hours as needed for flatulence.   11/14/2015 at Unknown time  . traMADol (ULTRAM) 50 MG tablet TAKE 1 TABLET 3 TIMES DAILY AS NEEDED FOR PAIN 60 tablet 0 Past Week at Unknown time  . atorvastatin (LIPITOR) 80 MG tablet Take 1 tablet (80 mg total) by mouth daily at 6 PM. 30 tablet 3 not started  . furosemide (LASIX) 40 MG tablet Take 1 tablet (40 mg total) by mouth daily. For 7 Days 7 tablet 0   .  potassium chloride SA (K-DUR,KLOR-CON) 20 MEQ tablet Take 1 tablet (20 mEq total) by mouth daily. For 7 Days 7 tablet 0    Scheduled:  . aspirin EC  325 mg Oral Daily  . ceFEPime (MAXIPIME) IV  1 g Intravenous 3 times per day  . dextromethorphan-guaiFENesin  1 tablet Oral BID  . FLUoxetine  20 mg Oral Daily  . levalbuterol  1.25 mg Nebulization QID  . metoprolol  25 mg Oral BID  . pantoprazole  40 mg Oral Daily  . vancomycin  1,250 mg Intravenous Q12H   Infusions:  . sodium chloride 75 mL/hr at 11/15/15 2302  . heparin 1,250 Units/hr (11/16/15 0121)    Assessment: 50yo female c/o right flank and right lower CP w/ SOB and fever, CP aggravated w/ coughing and deep inspiration, VQ scan shows intermediate probability of PE, to begin heparin. HL borderline therapeutic at 0.32 at 1,250 units/hr  Goal of Therapy:  Heparin level 0.3-0.7 units/ml Monitor platelets by  anticoagulation protocol: Yes   Plan:  Increase heparin gtt slightly to 1,350 units/hr Monitor daily HL, CBC, s/s of bleed Check 6 hr HL  Elenor Quinones, PharmD, [facility] Clinical Pharmacist Pager [phone] 11/16/2015 12:16 PM

## 2015-11-16 NOTE — Progress Notes (Signed)
Utilization Review Completed.  

## 2015-11-16 NOTE — ED Provider Notes (Signed)
CSN: [id number]     Arrival date & time 11/15/15  1625 History   First MD Initiated Contact with Patient 11/15/15 1807     Chief Complaint  Patient presents with  . Shortness of Breath  . Back Pain  . Post-op Problem     (Consider location/radiation/quality/duration/timing/severity/associated sxs/prior Treatment) Patient is a 50 y.o. female presenting with shortness of breath and back pain.  Shortness of Breath Associated symptoms: chest pain   Associated symptoms: no abdominal pain, no cough, no fever, no rash and no vomiting   Back Pain Associated symptoms: chest pain   Associated symptoms: no abdominal pain, no dysuria, no fever and no weakness     50 y.o. female with PMH of CAD, s/p of CABG 11/02/15 by Dr. Roxan Hockey, hyperlipidemia, asthma, GERD, depression, anxiety, DDD, TMG, who presents with right sided flank pain and chest pain, shortness of breath and fever.  Yesterday she started having sob and R lower chest pain. It is severe, constant, sharp, pleuritic. It is aggravated by coughing or deep breath. Patient has mild cough with scant sputum.  No abdominal pain, no headache.  Past Medical History  Diagnosis Date  . Asthma   . GERD (gastroesophageal reflux disease)   . DDD (degenerative disc disease)   . Hyperlipemia   . Anxiety   . Hemorrhoids   . TMJ (temporomandibular joint disorder)    Past Surgical History  Procedure Laterality Date  . Cholecystectomy    . Lumbar fusion    . Cardiac catheterization N/A 10/29/2015    Procedure: Left Heart Cath and Coronary Angiography;  Surgeon: [name], MD;  Location: [facility];  Service: Cardiovascular;  Laterality: N/A;  . Coronary artery bypass graft N/A 11/02/2015    Procedure: CORONARY ARTERY BYPASS GRAFTING (CABG)x 1 using left internal mammary artery.;  Surgeon: Melrose Nakayama, MD;  Location: [facility];  Service: Open Heart Surgery;  Laterality: N/A;  . Tee without cardioversion N/A 11/02/2015   Procedure: TRANSESOPHAGEAL ECHOCARDIOGRAM (TEE);  Surgeon: Melrose Nakayama, MD;  Location: [facility];  Service: Open Heart Surgery;  Laterality: N/A;   Family History  Problem Relation Age of Onset  . Colon cancer Neg Hx   . Colon polyps Father   . Stomach cancer Paternal Grandmother   . Esophageal cancer Maternal Grandfather   . Breast cancer Mother   . Ovarian cancer      Maternal Side Great  Aunt   Social History  Substance Use Topics  . Smoking status: Current Every Day Smoker  . Smokeless tobacco: Never Used     Comment: Counseling paper for smoking given to patient in exam room   . Alcohol Use: No   OB History    No data available     Review of Systems  Constitutional: Negative for fever and chills.  HENT: Negative for nosebleeds.   Eyes: Negative for visual disturbance.  Respiratory: Positive for shortness of breath. Negative for cough.   Cardiovascular: Positive for chest pain.  Gastrointestinal: Negative for nausea, vomiting, abdominal pain, diarrhea and constipation.  Genitourinary: Negative for dysuria.  Musculoskeletal: Positive for back pain.  Skin: Negative for rash.  Neurological: Negative for weakness.  All other systems reviewed and are negative.     Allergies  Atorvastatin; Iohexol; Penicillins; Sulfa antibiotics; and Zithromax  Home Medications   Prior to Admission medications   Medication Sig Start Date End Date Taking? Authorizing Provider  acetaminophen (TYLENOL) 500 MG tablet Take 1,000 mg by mouth  every 6 (six) hours as needed for moderate pain.   Yes Historical Provider, MD  albuterol (PROVENTIL HFA;VENTOLIN HFA) 108 (90 BASE) MCG/ACT inhaler Inhale 2 puffs into the lungs every 6 (six) hours as needed for wheezing or shortness of breath. 07/02/14  Yes Sharion Balloon, [name]  aspirin EC 325 MG EC tablet Take 1 tablet (325 mg total) by mouth daily. 11/06/15  Yes Erin R Barrett, PA-C  esomeprazole (NEXIUM) 40 MG capsule TAKE ONE (1) CAPSULE EACH DAY  10/16/15  Yes [name] [name], [name]  FLUoxetine (PROZAC) 20 MG capsule TAKE ONE (1) CAPSULE EACH DAY 10/16/15  Yes [name] [name], [name]  lisinopril (PRINIVIL,ZESTRIL) 5 MG tablet Take 1 tablet (5 mg total) by mouth daily. 11/06/15  Yes Erin R Barrett, PA-C  LORazepam (ATIVAN) 1 MG tablet TAKE 1 TABLET TWICE DAILY AS NEEDED FOR ANXIETY 10/16/15  Yes [name] [name], [name]  metoprolol (LOPRESSOR) 50 MG tablet Take 1 tablet (50 mg total) by mouth 2 (two) times daily. Patient taking differently: Take 25 mg by mouth 2 (two) times daily. HOLD IF PULSE <60 11/06/15  Yes Erin R Barrett, PA-C  oxyCODONE (OXY IR/ROXICODONE) 5 MG immediate release tablet Take 1-2 tablets (5-10 mg total) by mouth every 3 (three) hours as needed for severe pain. 11/06/15  Yes Erin R Barrett, PA-C  simethicone (MYLICON) 0000000 MG chewable tablet Chew 125-250 mg by mouth every 6 (six) hours as needed for flatulence.   Yes Historical Provider, MD  traMADol (ULTRAM) 50 MG tablet TAKE 1 TABLET 3 TIMES DAILY AS NEEDED FOR PAIN 03/02/15  Yes [name] [name], [name]  atorvastatin (LIPITOR) 80 MG tablet Take 1 tablet (80 mg total) by mouth daily at 6 PM. 11/06/15   Erin R Barrett, PA-C  furosemide (LASIX) 40 MG tablet Take 1 tablet (40 mg total) by mouth daily. For 7 Days 11/06/15   Erin R Barrett, PA-C  potassium chloride SA (K-DUR,KLOR-CON) 20 MEQ tablet Take 1 tablet (20 mEq total) by mouth daily. For 7 Days 11/06/15   Erin R Barrett, PA-C   BP 145/70 mmHg  Pulse 114  Temp(Src) 100 F (37.8 C) (Rectal)  Resp 29  Ht 5\' 8"  (1.727 m)  Wt 95.255 kg  BMI 31.94 kg/m2  SpO2 92%  LMP 11/01/2015 (Approximate) Physical Exam  Constitutional: She is oriented to person, place, and time. No distress.  HENT:  Head: Normocephalic and atraumatic.  Eyes: EOM are normal. Pupils are equal, round, and reactive to light.  Neck: Normal range of motion. Neck supple.  Cardiovascular: Intact distal pulses.   Pulmonary/Chest: No respiratory  distress. She has no wheezes. She has no rales.  Sternotomy incision site is c/d/i  Abdominal: Soft. There is no tenderness. There is no rebound and no guarding.  Musculoskeletal: Normal range of motion.  Neurological: She is alert and oriented to person, place, and time.  Skin: No rash noted. She is not diaphoretic.  Psychiatric: She has a normal mood and affect.    ED Course  Procedures (including critical care time) Labs Review Labs Reviewed  COMPREHENSIVE METABOLIC PANEL - Abnormal; Notable for the following:    Chloride 97 (*)    Glucose, Bld 174 (*)    Creatinine, Ser 1.13 (*)    GFR calc non Af Amer 57 (*)    Anion gap 16 (*)    All other components within normal limits  CBC WITH DIFFERENTIAL/PLATELET - Abnormal; Notable for the following:    WBC 21.7 (*)    RBC  3.76 (*)    Hemoglobin 10.8 (*)    HCT 34.0 (*)    Neutro Abs 17.8 (*)    Monocytes Absolute 2.1 (*)    All other components within normal limits  BLOOD GAS, ARTERIAL - Abnormal; Notable for the following:    pH, Arterial 7.316 (*)    Bicarbonate 19.0 (*)    Acid-base deficit 5.9 (*)    All other components within normal limits  I-STAT CG4 LACTIC ACID, ED - Abnormal; Notable for the following:    Lactic Acid, Venous 3.44 (*)    All other components within normal limits  URINE CULTURE  CULTURE, BLOOD (ROUTINE X 2)  CULTURE, BLOOD (ROUTINE X 2)  CULTURE, EXPECTORATED SPUTUM-ASSESSMENT  GRAM STAIN  INFLUENZA PANEL BY PCR (TYPE A & B, H1N1)  URINALYSIS, ROUTINE W REFLEX MICROSCOPIC (NOT AT [facility])  CREATININE, URINE, RANDOM  UREA NITROGEN, URINE  HIV ANTIBODY (ROUTINE TESTING)  STREP PNEUMONIAE URINARY ANTIGEN  LEGIONELLA ANTIGEN, URINE  TROPONIN I  TROPONIN I  TROPONIN I  LACTIC ACID, PLASMA  LACTIC ACID, PLASMA  PROCALCITONIN  PROTIME-INR  APTT  ANTITHROMBIN III  PROTEIN C ACTIVITY  PROTEIN C, TOTAL  PROTEIN S ACTIVITY  PROTEIN S, TOTAL  LUPUS ANTICOAGULANT PANEL  BETA-2-GLYCOPROTEIN I ABS,  IGG/M/A  HOMOCYSTEINE  FACTOR 5 LEIDEN  PROTHROMBIN GENE MUTATION  CARDIOLIPIN ANTIBODIES, IGG, IGM, IGA  HEPARIN LEVEL (UNFRACTIONATED)  CBC  I-STAT CG4 LACTIC ACID, ED  I-STAT TROPOININ, ED  I-STAT BETA HCG BLOOD, ED ([facility], [facility], [facility] ONLY)    Imaging Review Dg Chest 2 View  11/15/2015  CLINICAL DATA:  Right-sided chest tightness and shortness of breath EXAM: CHEST  2 VIEW COMPARISON:  11/04/2015 FINDINGS: Streaky and linear opacity in the lung bases is probably related atelectasis although pneumonia medial lung bases cannot be entirely excluded. Opacity seen posteriorly in the costophrenic sulcus on the lateral film. Lung volumes are low. Patient is status post median sternotomy. The visualized bony structures of the thorax are intact. IMPRESSION: Interval decrease in volume with interval development of bibasilar collapse/ consolidation. Electronically Signed   By: Misty Stanley M.D.   On: 11/15/2015 17:50   Ct Chest Wo Contrast  11/15/2015  CLINICAL DATA:  Shortness of breath. Right-sided back pain. Pneumonia. One week postop from CABG. EXAM: CT CHEST WITHOUT CONTRAST TECHNIQUE: Multidetector CT imaging of the chest was performed following the standard protocol without IV contrast. COMPARISON:  10/13/2011 FINDINGS: Mediastinum/Lymph Nodes: No masses or pathologically enlarged lymph nodes identified on this un-enhanced exam. No evidence of mediastinal hematoma. Recent postop changes from CABG noted. No evidence of pericardial effusion. Lungs/Pleura: Bilateral lower lobe atelectasis or infiltrates are seen. Small right pleural effusion noted. No evidence of pneumothorax. Upper abdomen: No acute findings. Musculoskeletal: No chest wall mass or suspicious bone lesions identified. IMPRESSION: Bilateral lower lobe atelectasis versus infiltrates and small right pleural effusion. Electronically Signed   By: Earle Gell M.D.   On: 11/15/2015 20:31   Nm Pulmonary Perf And Vent  11/16/2015  CLINICAL DATA:   Shortness of breath.  Pleuritic chest pain. EXAM: NUCLEAR MEDICINE VENTILATION - PERFUSION LUNG SCAN TECHNIQUE: Ventilation images were obtained in multiple projections using inhaled aerosol Tc-33m DTPA. Perfusion images were obtained in multiple projections after intravenous injection of Tc-60m MAA. RADIOPHARMACEUTICALS:  31 millicurie [id number] DTPA aerosol inhalation and 4.1 millicurie [id number] MAA IV COMPARISON:  Chest x-ray and CT from earlier today FINDINGS: There is a moderate sized area of left upper lobe posterior segment matched  defect. There is a large peripheral area of right lower lobe basilar segment perfusion defect which is triple matched (see RPO). IMPRESSION: Intermediate probability category (20-79%) by PIOPED II. Electronically Signed   By: Monte Fantasia M.D.   On: 11/16/2015 00:41   I have personally reviewed and evaluated these images and lab results as part of my medical decision-making.   EKG Interpretation None      MDM   Final diagnoses:  Fever in adult 2.  Sepsis due to unspecified organism.    50 y.o. female with PMH of CAD, s/p of CABG 11/02/15 by Dr. Roxan Hockey, hyperlipidemia, asthma, GERD, depression, anxiety, DDD, TMG, who presents with right sided flank pain and chest pain, shortness of breath and fever.  On exam, febrile to 101.2, tachy, tachypneic.  Concern for sepsis 2/2 pulm source.  Will cover w/ vanc/cefepime.  Will get blood cx.  Will obtian cxr, cbc/bmp/lactic acid/ua  Lactic acid elevated.  Will give fluid bolus.  cxr w/o obv infiltrate.  Will obtain ct chest.  Wanted to obtain pe study but unfortunately pt has contrast allergy, pt also with aki, will obtain non-con ct chest to eval for parenchymal infiltrate.  Ct chest w/ atelectasis vs. pna at the bases and small R pleural effusion.  Spoke w/ oncall CT surgeon regarding findings and he feels empyema very unlikely with this surgery.  Considering pna vs. pe at this point.  Pt will need vq  scan.  Have covered for HCAP.  Will get admitted to medicine for further work up and eval.    Jarome Matin, MD 11/16/15 [id number]  Elnora Morrison, MD 11/21/15 [phone]

## 2015-11-16 NOTE — Progress Notes (Signed)
ANTICOAGULATION CONSULT NOTE - Initial Consult  Pharmacy Consult for heparin Indication: pulmonary embolus  Allergies  Allergen Reactions  . Atorvastatin Other (See Comments)    myalgias  . Iohexol Itching, Rash and Other (See Comments)     Code: RASH, Desc: White blisters in mouth during ivp in [facility] '93, ok w/ 13 hour prep today//a.calhoun, Onset Date: [id number]   . Penicillins Other (See Comments)    Has patient had a PCN reaction causing immediate rash, facial/tongue/throat swelling, SOB or lightheadedness with hypotension: { Has patient had a PCN reaction causing severe rash involving mucus membranes or skin necrosis: { Has patient had a PCN reaction that required hospitalization { Has patient had a PCN reaction occurring within the last 10 years: { If all of the above answers are "NO", then may proceed with Cephalosporin use.  . Sulfa Antibiotics Itching and Rash  . Zithromax [Azithromycin] Itching and Rash    Patient Measurements: Height: 5\' 8"  (172.7 cm) Weight: 210 lb (95.255 kg) IBW/kg (Calculated) : 63.9 Heparin Dosing Weight: 85kg  Vital Signs: Temp: 100 F (37.8 C) (01/29 2232) Temp Source: Rectal (01/29 2232) BP: 145/70 mmHg (01/29 2306) Pulse Rate: 114 (01/29 2306)  Labs:  Recent Labs  11/15/15 1836  HGB 10.8*  HCT 34.0*  PLT 208  CREATININE 1.13*    Estimated Creatinine Clearance: 74.3 mL/min (by C-G formula based on Cr of 1.13).   Medical History: Past Medical History  Diagnosis Date  . Asthma   . GERD (gastroesophageal reflux disease)   . DDD (degenerative disc disease)   . Hyperlipemia   . Anxiety   . Hemorrhoids   . TMJ (temporomandibular joint disorder)     Medications:  Prescriptions prior to admission  Medication Sig Dispense Refill Last Dose  . acetaminophen (TYLENOL) 500 MG tablet Take 1,000 mg by mouth every 6 (six) hours as needed for moderate pain.   11/15/2015 at Unknown time  . albuterol (PROVENTIL HFA;VENTOLIN HFA)  108 (90 BASE) MCG/ACT inhaler Inhale 2 puffs into the lungs every 6 (six) hours as needed for wheezing or shortness of breath. 1 Inhaler 2 11/15/2015 at Unknown time  . aspirin EC 325 MG EC tablet Take 1 tablet (325 mg total) by mouth daily. 30 tablet 0 11/15/2015 at Unknown time  . esomeprazole (NEXIUM) 40 MG capsule TAKE ONE (1) CAPSULE EACH DAY 30 capsule 4 11/15/2015 at Unknown time  . FLUoxetine (PROZAC) 20 MG capsule TAKE ONE (1) CAPSULE EACH DAY 30 capsule 2 11/15/2015 at Unknown time  . lisinopril (PRINIVIL,ZESTRIL) 5 MG tablet Take 1 tablet (5 mg total) by mouth daily. 30 tablet 3 11/15/2015 at Unknown time  . LORazepam (ATIVAN) 1 MG tablet TAKE 1 TABLET TWICE DAILY AS NEEDED FOR ANXIETY 60 tablet 1 Past Week at Unknown time  . metoprolol (LOPRESSOR) 50 MG tablet Take 1 tablet (50 mg total) by mouth 2 (two) times daily. (Patient taking differently: Take 25 mg by mouth 2 (two) times daily. HOLD IF PULSE <60) 60 tablet 3 11/15/2015 at 1000  . oxyCODONE (OXY IR/ROXICODONE) 5 MG immediate release tablet Take 1-2 tablets (5-10 mg total) by mouth every 3 (three) hours as needed for severe pain. 30 tablet 0 11/15/2015 at Unknown time  . simethicone (MYLICON) 0000000 MG chewable tablet Chew 125-250 mg by mouth every 6 (six) hours as needed for flatulence.   11/14/2015 at Unknown time  . traMADol (ULTRAM) 50 MG tablet TAKE 1 TABLET 3 TIMES DAILY AS NEEDED FOR PAIN 60  tablet 0 Past Week at Unknown time  . atorvastatin (LIPITOR) 80 MG tablet Take 1 tablet (80 mg total) by mouth daily at 6 PM. 30 tablet 3 not started  . furosemide (LASIX) 40 MG tablet Take 1 tablet (40 mg total) by mouth daily. For 7 Days 7 tablet 0   . potassium chloride SA (K-DUR,KLOR-CON) 20 MEQ tablet Take 1 tablet (20 mEq total) by mouth daily. For 7 Days 7 tablet 0    Scheduled:  . aspirin EC  325 mg Oral Daily  . ceFEPime (MAXIPIME) IV  1 g Intravenous 3 times per day  . dextromethorphan-guaiFENesin  1 tablet Oral BID  . FLUoxetine  20 mg  Oral Daily  . levalbuterol  1.25 mg Nebulization QID  . metoprolol  25 mg Oral BID  . pantoprazole  40 mg Oral Daily  . vancomycin  1,250 mg Intravenous Q12H   Infusions:  . sodium chloride 75 mL/hr at 11/15/15 2302    Assessment: 50yo female c/o right flank and right lower CP w/ SOB and fever, CP aggravated w/ coughing and deep inspiration, VQ scan shows intermediate probability of PE, to begin heparin.  Goal of Therapy:  Heparin level 0.3-0.7 units/ml Monitor platelets by anticoagulation protocol: Yes   Plan:  Rec'd Lovenox 40mg  <2hr ago; will give small heparin bolus of 2000 units then begin heparin gtt at 1250 units/hr and monitor heparin levels and CBC.  Wynona Neat, PharmD, BCPS  11/16/2015,12:56 AM

## 2015-11-16 NOTE — Progress Notes (Signed)
[facility]                                                                              Patient Demographics  Brenda Cruz, is a 50 y.o. female, DOB - [birth year], [id number]  Admit date - 11/15/2015   Admitting Physician Ivor Costa, MD  Outpatient Primary MD for the patient is Pcp Not In System  LOS - 1   Chief Complaint  Patient presents with  . Shortness of Breath  . Back Pain  . Post-op Problem      HPI on 11/15/2015 by Dr. Ivor Costa Brenda Cruz is a 50 y.o. female with PMH of CAD, s/p of CABG 11/02/15 by Dr. Roxan Hockey, hyperlipidemia, asthma, GERD, depression, anxiety, DDD, TMG, who presents with right sided flank pain and chest pain, shortness of breath and fever.  Patient was recently hospitalized from 01/12-1/20/17 and had CABG done by Dr. Roxan Hockey on 11/02/15. She has been doing fine utile yesterday, when she started having shortness and right flank pain and R lower chest pain. It is severe, constant, sharp, pleuritic. It is aggravated by coughing or deep breath. Patient has mild cough with small amount dark colored sputum production. She did not have fever at home, but was found to have temperature 101.2 in the emergency room. No chills. Patient reports that she had tenderness over left calf area two days ago, which has gone today. Patient does not have abdominal pain, diarrhea, symptoms of a UTI or unilateral weakness.  In ED, patient was found to have WBC 21.7, lactate 1.83-->3.44, negative troponin, negative pregnancy test, temperature 11.2, tachycardia, tachypnea, acute renal injury. CT-chest showed bilateral lower lobe atelectasis versus infiltrates and small right pleural effusion, but no evidence of mediastinal hematoma or evidence of pericardial effusion. Patient is admitted to inpatient for further eval and treatment. EDP consulted on-call cardiothoracic surgeon.  Assessment & Plan   Acute respiratory insufficiency secondary to possible HCAP vs  PE -Continue supplemental O2 to maintain sats above 92% -Upon admission, patient tachycardic and tachypneic -CT chest without contrast showed bilateral lower lobe atelectasis versus infiltrate and small right pleural effusion -VQ scan obtained as patient has allergy to contrast dye: She is intermediate probability for PE -Patient started on IV heparin -Patient was also started on vancomycin and cefepime for pneumonia -Echocardiogram unfortunately Doppler pending -Vicryl and negative  Sepsis possibly secondary to pneumonia -CT chest as above -Treatment plan as above -Upon admission, patient was febrile, with tachycardia and tachypnea and leukocytosis -Blood cultures pending -Strep pneumonia and legionella urine antigens pending  Questionable PE -Treatment plan as above, hypercoagulable panel pending  Coronary artery disease -Status post CABG on 11/02/2015 -Cardiothoracic surgery made aware of patient's admission -Troponin mildly elevated however flat, continue to trend -Continue aspirin, statin, beta blocker  Essential hypertension -Lisinopril held due to acute renal injury -Continue metoprolol and IV hydralazine as needed  Acute kidney injury -Likely secondary to sepsis and dehydration versus medication -Appears to have resolved, currently 0.8 -Continue to monitor BMP  GERD -Continue PPI  Depression and anxiety -Continue Prozac and Ativan  Chronic normocytic anemia -Baseline hemoglobin approximately 10, currently 9.3 -Continue to monitor CBC  Code Status: Full  Family Communication: None at bedside  Disposition Plan: Admitted. Continue to monitor in step down. Pending Echo and LE doppler  Time Spent in minutes   30 minutes  Procedures  VQ Scan  Consults   Cardiothoracic surgery   DVT Prophylaxis  heparin  Lab Results  Component Value Date   PLT 151 11/16/2015    Medications  Scheduled Meds: . aspirin EC  325 mg Oral Daily  . ceFEPime (MAXIPIME) IV   1 g Intravenous 3 times per day  . dextromethorphan-guaiFENesin  1 tablet Oral BID  . FLUoxetine  20 mg Oral Daily  . levalbuterol  1.25 mg Nebulization QID  . metoprolol  25 mg Oral BID  . pantoprazole  40 mg Oral Daily  . vancomycin  1,250 mg Intravenous Q12H   Continuous Infusions: . sodium chloride 75 mL/hr at 11/15/15 2302  . heparin 1,250 Units/hr (11/16/15 0121)   PRN Meds:.acetaminophen, diphenhydrAMINE, hydrALAZINE, HYDROmorphone (DILAUDID) injection, LORazepam, oxyCODONE, simethicone, technetium TC 64M diethylenetriame-pentaacetic acid  Antibiotics    Anti-infectives    Start     Dose/Rate Route Frequency Ordered Stop   11/15/15 2000  ceFEPIme (MAXIPIME) 1 g in dextrose 5 % 50 mL IVPB     1 g 100 mL/hr over 30 Minutes Intravenous 3 times per day 11/15/15 1958     11/15/15 2000  vancomycin (VANCOCIN) 1,250 mg in sodium chloride 0.9 % 250 mL IVPB     1,250 mg 166.7 mL/hr over 90 Minutes Intravenous Every 12 hours 11/15/15 1958        Subjective:   Brenda Cruz seen and examined today.  She complains of pain particularly in her side. She denies chest pain at this time. Denies any abdominal pain, nausea or vomiting. She continues to have shortness of breath.   Objective:   Filed Vitals:   11/16/15 0825 11/16/15 0848 11/16/15 0918 11/16/15 0921  BP:  130/73  132/72  Pulse:  99 101 100  Temp:  97.4 F (36.3 C)    TempSrc:  Oral    Resp:  20 18   Height:      Weight:      SpO2: 99% 97% 97%     Wt Readings from Last 3 Encounters:  11/15/15 95.255 kg (210 lb)  11/06/15 103.1 kg (227 lb 4.7 oz)  10/16/15 103.602 kg (228 lb 6.4 oz)     Intake/Output Summary (Last 24 hours) at 11/16/15 1104 Last data filed at 11/16/15 0800  Gross per 24 hour  Intake 4270.63 ml  Output      0 ml  Net 4270.63 ml    Exam  General: Well developed, well nourished, NAD  HEENT: NCAT,  mucous membranes moist.   Cardiovascular: S1 S2 auscultated, tachycardic  Respiratory:  Slightly diminished, however Clear   Abdomen: Soft, obese, nontender, nondistended, + bowel sounds  Extremities: warm dry without cyanosis clubbing or edema  Neuro: AAOx3, nonfocal  Skin: Midline incision chest wall, clean  Psych: Appropriate  Data Review   Micro Results No results found for this or any previous visit (from the past 240 hour(s)).  Radiology Reports Dg Chest 2 View  11/15/2015  CLINICAL DATA:  Right-sided chest tightness and shortness of breath EXAM: CHEST  2 VIEW COMPARISON:  11/04/2015 FINDINGS: Streaky and linear opacity in the lung bases is probably related atelectasis although pneumonia medial lung bases cannot be entirely excluded. Opacity seen posteriorly in the costophrenic sulcus on the lateral film. Lung volumes are low. Patient is status post  median sternotomy. The visualized bony structures of the thorax are intact. IMPRESSION: Interval decrease in volume with interval development of bibasilar collapse/ consolidation. Electronically Signed   By: Misty Stanley M.D.   On: 11/15/2015 17:50   Dg Chest 2 View  10/29/2015  CLINICAL DATA:  Mid chest pressure off and on for 3 weeks with exertion, hypertension, asthma, smoker EXAM: CHEST  2 VIEW COMPARISON:  03/20/2014 ; correlation CT chest 10/13/2011 FINDINGS: Normal heart size, mediastinal contours, and pulmonary vascularity. Minimal central peribronchial thickening. Focus of scarring in lingula adjacent to LEFT heart border similar to prior CT. No acute infiltrate, pleural effusion or pneumothorax. Bones unremarkable. IMPRESSION: Bronchitic changes with lingular scarring. No acute abnormalities. Electronically Signed   By: Lavonia Dana M.D.   On: 10/29/2015 10:27   Ct Chest Wo Contrast  11/15/2015  CLINICAL DATA:  Shortness of breath. Right-sided back pain. Pneumonia. One week postop from CABG. EXAM: CT CHEST WITHOUT CONTRAST TECHNIQUE: Multidetector CT imaging of the chest was performed following the standard protocol  without IV contrast. COMPARISON:  10/13/2011 FINDINGS: Mediastinum/Lymph Nodes: No masses or pathologically enlarged lymph nodes identified on this un-enhanced exam. No evidence of mediastinal hematoma. Recent postop changes from CABG noted. No evidence of pericardial effusion. Lungs/Pleura: Bilateral lower lobe atelectasis or infiltrates are seen. Small right pleural effusion noted. No evidence of pneumothorax. Upper abdomen: No acute findings. Musculoskeletal: No chest wall mass or suspicious bone lesions identified. IMPRESSION: Bilateral lower lobe atelectasis versus infiltrates and small right pleural effusion. Electronically Signed   By: Earle Gell M.D.   On: 11/15/2015 20:31   Nm Pulmonary Perf And Vent  11/16/2015  CLINICAL DATA:  Shortness of breath.  Pleuritic chest pain. EXAM: NUCLEAR MEDICINE VENTILATION - PERFUSION LUNG SCAN TECHNIQUE: Ventilation images were obtained in multiple projections using inhaled aerosol Tc-22m DTPA. Perfusion images were obtained in multiple projections after intravenous injection of Tc-45m MAA. RADIOPHARMACEUTICALS:  31 millicurie [id number] DTPA aerosol inhalation and 4.1 millicurie [id number] MAA IV COMPARISON:  Chest x-ray and CT from earlier today FINDINGS: There is a moderate sized area of left upper lobe posterior segment matched defect. There is a large peripheral area of right lower lobe basilar segment perfusion defect which is triple matched (see RPO). IMPRESSION: Intermediate probability category (20-79%) by PIOPED II. Electronically Signed   By: Monte Fantasia M.D.   On: 11/16/2015 00:41   Dg Chest Port 1 View  11/04/2015  CLINICAL DATA:  CABG EXAM: PORTABLE CHEST 1 VIEW COMPARISON:  11/03/2015 FINDINGS: Swan-Ganz catheter removed. Left chest tube removed. Mediastinal drain removed. Central venous catheters have been removed. Negative for pneumothorax. Negative for heart failure or effusion. Lungs are clear. IMPRESSION: Support lines have been  removed.  No acute cardiopulmonary disease. Electronically Signed   By: Franchot Gallo M.D.   On: 11/04/2015 08:08   Dg Chest Port 1 View  11/03/2015  CLINICAL DATA:  Patient status post CABG procedure. EXAM: PORTABLE CHEST 1 VIEW COMPARISON:  Chest radiograph 11/02/2015. FINDINGS: PA catheter tip projects over the expected location the proximal right main pulmonary artery. Bilateral chest tubes in place. Interval extubation and removal of enteric tube. Multiple monitoring leads course over the patient. Stable cardiac and mediastinal contours status post median sternotomy. Focal opacity left lung base, unchanged from prior. Interval improvement in previously described perihilar interstitial opacities. No large pleural effusion or pneumothorax. IMPRESSION: Interval extubation. Probable focal atelectasis left lung base. Interval retraction pulmonary arterial catheter with tip projecting over the expected location main  right pulmonary artery. Electronically Signed   By: Lovey Newcomer M.D.   On: 11/03/2015 08:14   Dg Chest Port 1 View  11/02/2015  CLINICAL DATA:  Status post CABG today.  Initial encounter. EXAM: PORTABLE CHEST 1 VIEW COMPARISON:  PA and lateral chest 10/29/2015. FINDINGS: Endotracheal tube is in place with the tip in good position at the level of the clavicular heads. Right IJ approach Swan-Ganz catheter is also in place with the tip in the the distal descending interlobar pulmonary artery on the right. Recommend withdrawal approximately 7.5 cm. Bilateral chest tubes are noted. No pneumothorax. Mild basilar atelectasis is seen. Six intact median sternotomy wires are identified. IMPRESSION: Right IJ approach Swan-Ganz catheter tip is in the distal descending interlobar pulmonary artery on the right. Recommend withdrawal of 7.5 cm. Support apparatus otherwise projects in good position. No pneumothorax. Bibasilar atelectasis. These results were called by telephone at the time of interpretation on  11/02/2015 at 12:24 pm to the patient's nurse Precious Bard, who verbally acknowledged these results. Electronically Signed   By: Inge Rise M.D.   On: 11/02/2015 12:28    CBC  Recent Labs Lab 11/15/15 1836 11/16/15 0918  WBC 21.7* 18.2*  HGB 10.8* 9.3*  HCT 34.0* 28.5*  PLT 208 151  MCV 90.4 91.6  MCH 28.7 29.9  MCHC 31.8 32.6  RDW 13.1 13.5  LYMPHSABS 1.7  --   MONOABS 2.1*  --   EOSABS 0.1  --   BASOSABS 0.0  --     Chemistries   Recent Labs Lab 11/15/15 1836 11/16/15 0918  NA 135 137  K 5.0 5.2*  CL 97* 106  CO2 22 17*  GLUCOSE 174* 129*  BUN 12 13  CREATININE 1.13* 0.80  CALCIUM 9.7 8.4*  AST 24  --   ALT 17  --   ALKPHOS 75  --   BILITOT 0.9  --    ------------------------------------------------------------------------------------------------------------------ estimated creatinine clearance is 105 mL/min (by C-G formula based on Cr of 0.8). ------------------------------------------------------------------------------------------------------------------ No results for input(s): HGBA1C in the last 72 hours. ------------------------------------------------------------------------------------------------------------------ No results for input(s): CHOL, HDL, LDLCALC, TRIG, CHOLHDL, LDLDIRECT in the last 72 hours. ------------------------------------------------------------------------------------------------------------------ No results for input(s): TSH, T4TOTAL, T3FREE, THYROIDAB in the last 72 hours.  Invalid input(s): FREET3 ------------------------------------------------------------------------------------------------------------------ No results for input(s): VITAMINB12, FOLATE, FERRITIN, TIBC, IRON, RETICCTPCT in the last 72 hours.  Coagulation profile  Recent Labs Lab 11/16/15 0114  INR 1.36    No results for input(s): DDIMER in the last 72 hours.  Cardiac Enzymes  Recent Labs Lab 11/16/15 0114 11/16/15 0506 11/16/15 0918  TROPONINI  0.05* 0.05* 0.04*   ------------------------------------------------------------------------------------------------------------------ Invalid input(s): POCBNP    Zohar Laing D.O. on 11/16/2015 at 11:04 AM  Between 7am to 7pm - Pager - [phone]  After 7pm go to [email or web address] - password [email or web address]  And look for the night coverage person covering for me after hours  [facility] Office  [phone]

## 2015-11-16 NOTE — Progress Notes (Signed)
ANTICOAGULATION CONSULT NOTE - Follow Up Consult  Pharmacy Consult for heparin Indication: pulmonary embolus   Patient Measurements: Height: 5\' 8"  (172.7 cm) Weight: 210 lb (95.255 kg) IBW/kg (Calculated) : 63.9 Heparin Dosing Weight: 85kg  Vital Signs: Temp: 98.2 F (36.8 C) (01/30 2000) Temp Source: Oral (01/30 2000) BP: 127/72 mmHg (01/30 2000) Pulse Rate: 119 (01/30 2000)  Labs:  Recent Labs  11/15/15 1836 11/16/15 0114 11/16/15 0506 11/16/15 0918 11/16/15 1953  HGB 10.8*  --   --  9.3*  --   HCT 34.0*  --   --  28.5*  --   PLT 208  --   --  151  --   APTT  --  44*  --   --   --   LABPROT  --  16.9*  --   --   --   INR  --  1.36  --   --   --   HEPARINUNFRC  --   --   --  0.32 0.29*  CREATININE 1.13*  --   --  0.80  --   TROPONINI  --  0.05* 0.05* 0.04*  --     Estimated Creatinine Clearance: 105 mL/min (by C-G formula based on Cr of 0.8).   Medical History: Past Medical History  Diagnosis Date  . Asthma   . GERD (gastroesophageal reflux disease)   . DDD (degenerative disc disease)   . Hyperlipemia   . Anxiety   . Hemorrhoids   . TMJ (temporomandibular joint disorder)     Medications:  Prescriptions prior to admission  Medication Sig Dispense Refill Last Dose  . acetaminophen (TYLENOL) 500 MG tablet Take 1,000 mg by mouth every 6 (six) hours as needed for moderate pain.   11/15/2015 at Unknown time  . albuterol (PROVENTIL HFA;VENTOLIN HFA) 108 (90 BASE) MCG/ACT inhaler Inhale 2 puffs into the lungs every 6 (six) hours as needed for wheezing or shortness of breath. 1 Inhaler 2 11/15/2015 at Unknown time  . aspirin EC 325 MG EC tablet Take 1 tablet (325 mg total) by mouth daily. 30 tablet 0 11/15/2015 at Unknown time  . esomeprazole (NEXIUM) 40 MG capsule TAKE ONE (1) CAPSULE EACH DAY 30 capsule 4 11/15/2015 at Unknown time  . FLUoxetine (PROZAC) 20 MG capsule TAKE ONE (1) CAPSULE EACH DAY 30 capsule 2 11/15/2015 at Unknown time  . lisinopril  (PRINIVIL,ZESTRIL) 5 MG tablet Take 1 tablet (5 mg total) by mouth daily. 30 tablet 3 11/15/2015 at Unknown time  . LORazepam (ATIVAN) 1 MG tablet TAKE 1 TABLET TWICE DAILY AS NEEDED FOR ANXIETY 60 tablet 1 Past Week at Unknown time  . metoprolol (LOPRESSOR) 50 MG tablet Take 1 tablet (50 mg total) by mouth 2 (two) times daily. (Patient taking differently: Take 25 mg by mouth 2 (two) times daily. HOLD IF PULSE <60) 60 tablet 3 11/15/2015 at 1000  . oxyCODONE (OXY IR/ROXICODONE) 5 MG immediate release tablet Take 1-2 tablets (5-10 mg total) by mouth every 3 (three) hours as needed for severe pain. 30 tablet 0 11/15/2015 at Unknown time  . simethicone (MYLICON) 0000000 MG chewable tablet Chew 125-250 mg by mouth every 6 (six) hours as needed for flatulence.   11/14/2015 at Unknown time  . traMADol (ULTRAM) 50 MG tablet TAKE 1 TABLET 3 TIMES DAILY AS NEEDED FOR PAIN 60 tablet 0 Past Week at Unknown time  . atorvastatin (LIPITOR) 80 MG tablet Take 1 tablet (80 mg total) by mouth daily at 6 PM. 30  tablet 3 not started  . furosemide (LASIX) 40 MG tablet Take 1 tablet (40 mg total) by mouth daily. For 7 Days 7 tablet 0   . potassium chloride SA (K-DUR,KLOR-CON) 20 MEQ tablet Take 1 tablet (20 mEq total) by mouth daily. For 7 Days 7 tablet 0    Scheduled:  . aspirin EC  325 mg Oral Daily  . ceFEPime (MAXIPIME) IV  1 g Intravenous 3 times per day  . dextromethorphan-guaiFENesin  1 tablet Oral BID  . FLUoxetine  20 mg Oral Daily  . levalbuterol  1.25 mg Nebulization QID  . metoprolol  25 mg Oral BID  . pantoprazole  40 mg Oral Daily  . vancomycin  1,250 mg Intravenous Q12H   Infusions:  . sodium chloride 75 mL/hr at 11/15/15 2302  . heparin 1,350 Units/hr (11/16/15 1222)    Assessment: 50yo female c/o right flank and right lower CP w/ SOB and fever, CP aggravated w/ coughing and deep inspiration, VQ scan shows intermediate probability of PE. Currently IV heparin at 1350 units/hr. HL remains borderline  therapeutic at 0.29.  Goal of Therapy:  Heparin level 0.3-0.7 units/ml Monitor platelets by anticoagulation protocol: Yes   Plan:  Increase heparin gtt slightly to 1,450 units/hr Monitor daily HL, CBC, s/s of bleed Check 6 hr HL  Albertina Parr, PharmD., BCPS Clinical Pharmacist Pager [phone]

## 2015-11-17 ENCOUNTER — Inpatient Hospital Stay (HOSPITAL_COMMUNITY): Payer: Managed Care, Other (non HMO)

## 2015-11-17 DIAGNOSIS — M79669 Pain in unspecified lower leg: Secondary | ICD-10-CM

## 2015-11-17 DIAGNOSIS — I2699 Other pulmonary embolism without acute cor pulmonale: Secondary | ICD-10-CM

## 2015-11-17 LAB — CBC
HCT: 25.7 % — ABNORMAL LOW (ref 36.0–46.0)
Hemoglobin: 8 g/dL — ABNORMAL LOW (ref 12.0–15.0)
MCH: 28.4 pg (ref 26.0–34.0)
MCHC: 31.1 g/dL (ref 30.0–36.0)
MCV: 91.1 fL (ref 78.0–100.0)
PLATELETS: 136 10*3/uL — AB (ref 150–400)
RBC: 2.82 MIL/uL — ABNORMAL LOW (ref 3.87–5.11)
RDW: 13.4 % (ref 11.5–15.5)
WBC: 15.7 10*3/uL — AB (ref 4.0–10.5)

## 2015-11-17 LAB — RETICULOCYTES
RBC.: 2.83 MIL/uL — ABNORMAL LOW (ref 3.87–5.11)
RETIC CT PCT: 1.8 % (ref 0.4–3.1)
Retic Count, Absolute: 50.9 10*3/uL (ref 19.0–186.0)

## 2015-11-17 LAB — BASIC METABOLIC PANEL
ANION GAP: 8 (ref 5–15)
BUN: 9 mg/dL (ref 6–20)
CALCIUM: 8.2 mg/dL — AB (ref 8.9–10.3)
CO2: 22 mmol/L (ref 22–32)
CREATININE: 0.79 mg/dL (ref 0.44–1.00)
Chloride: 104 mmol/L (ref 101–111)
GLUCOSE: 129 mg/dL — AB (ref 65–99)
Potassium: 4.4 mmol/L (ref 3.5–5.1)
Sodium: 134 mmol/L — ABNORMAL LOW (ref 135–145)

## 2015-11-17 LAB — IRON AND TIBC
IRON: 7 ug/dL — AB (ref 28–170)
SATURATION RATIOS: 3 % — AB (ref 10.4–31.8)
TIBC: 253 ug/dL (ref 250–450)
UIBC: 246 ug/dL

## 2015-11-17 LAB — FERRITIN: FERRITIN: 281 ng/mL (ref 11–307)

## 2015-11-17 LAB — CARDIOLIPIN ANTIBODIES, IGG, IGM, IGA
ANTICARDIOLIPIN IGM: 12 [MPL'U]/mL (ref 0–12)
Anticardiolipin IgA: <9 APL U/mL (ref 0–11)

## 2015-11-17 LAB — HEPARIN LEVEL (UNFRACTIONATED)
HEPARIN UNFRACTIONATED: 0.41 [IU]/mL (ref 0.30–0.70)
Heparin Unfractionated: 0.21 IU/mL — ABNORMAL LOW (ref 0.30–0.70)

## 2015-11-17 LAB — VITAMIN B12: Vitamin B-12: 817 pg/mL (ref 180–914)

## 2015-11-17 LAB — HOMOCYSTEINE: Homocysteine: 3.5 umol/L (ref 0.0–15.0)

## 2015-11-17 LAB — STREP PNEUMONIAE URINARY ANTIGEN: STREP PNEUMO URINARY ANTIGEN: NEGATIVE

## 2015-11-17 LAB — FOLATE: FOLATE: 11.7 ng/mL (ref >5.9)

## 2015-11-17 MED ORDER — RIVAROXABAN 15 MG PO TABS
15.0000 mg | ORAL_TABLET | Freq: Two times a day (BID) | ORAL | Status: DC
  Administered 2015-11-17 – 2015-11-18 (×2): 15 mg via ORAL
  Filled 2015-11-17 (×3): qty 1

## 2015-11-17 MED ORDER — CETYLPYRIDINIUM CHLORIDE 0.05 % MT LIQD
7.0000 mL | Freq: Two times a day (BID) | OROMUCOSAL | Status: DC
  Administered 2015-11-17: 7 mL via OROMUCOSAL

## 2015-11-17 MED ORDER — HEPARIN BOLUS VIA INFUSION
2000.0000 [IU] | Freq: Once | INTRAVENOUS | Status: AC
  Administered 2015-11-17: 2000 [IU] via INTRAVENOUS
  Filled 2015-11-17: qty 2000

## 2015-11-17 MED ORDER — METHYLPREDNISOLONE SODIUM SUCC 125 MG IJ SOLR: 60.0000 mg | Freq: Once | INTRAMUSCULAR | Status: DC

## 2015-11-17 MED ORDER — DIPHENHYDRAMINE HCL 50 MG/ML IJ SOLN: 25.0000 mg | Freq: Once | INTRAMUSCULAR | Status: DC

## 2015-11-17 NOTE — Progress Notes (Addendum)
[facility]                                                                              Patient Demographics  Brenda Cruz, is a 50 y.o. female, DOB - 16-Aug-1966, [id number]  Admit date - 11/15/2015   Admitting Physician Ivor Costa, MD  Outpatient Primary MD for the patient is Pcp Not In System  LOS - 2   Chief Complaint  Patient presents with  . Shortness of Breath  . Back Pain  . Post-op Problem      HPI on 11/15/2015 by Dr. Ivor Costa Brenda Cruz is a 50 y.o. female with PMH of CAD, s/p of CABG 11/02/15 by Dr. Roxan Hockey, hyperlipidemia, asthma, GERD, depression, anxiety, DDD, TMG, who presents with right sided flank pain and chest pain, shortness of breath and fever.  Patient was recently hospitalized from 01/12-1/20/17 and had CABG done by Dr. Roxan Hockey on 11/02/15. She has been doing fine utile yesterday, when she started having shortness and right flank pain and R lower chest pain. It is severe, constant, sharp, pleuritic. It is aggravated by coughing or deep breath. Patient has mild cough with small amount dark colored sputum production. She did not have fever at home, but was found to have temperature 101.2 in the emergency room. No chills. Patient reports that she had tenderness over left calf area two days ago, which has gone today. Patient does not have abdominal pain, diarrhea, symptoms of a UTI or unilateral weakness.  In ED, patient was found to have WBC 21.7, lactate 1.83-->3.44, negative troponin, negative pregnancy test, temperature 11.2, tachycardia, tachypnea, acute renal injury. CT-chest showed bilateral lower lobe atelectasis versus infiltrates and small right pleural effusion, but no evidence of mediastinal hematoma or evidence of pericardial effusion. Patient is admitted to inpatient for further eval and treatment. EDP consulted on-call cardiothoracic surgeon.  Assessment & Plan   Acute respiratory insufficiency secondary to possible HCAP vs  PE -Continue supplemental O2 to maintain sats above 92% -Upon admission, patient tachycardic and tachypneic -CT chest without contrast showed bilateral lower lobe atelectasis versus infiltrate and small right pleural effusion -VQ scan obtained as patient has allergy to contrast dye: intermediate probability for PE -Continue IV heparin, will consult pharmacy regarding NOAC- started on Xarelto -Continue on vancomycin and cefepime for pneumonia -LE doppler: +DVT left distal femoral, popliteal, posterior tibial veins -Echocardiogram: EF [id number], grade 1 diastolic dysfunction -Influenza negative  Sepsis possibly secondary to pneumonia -CT chest as above -Treatment plan as above -Upon admission, patient was febrile, with tachycardia and tachypnea and leukocytosis -Leukocytosis and vitals improving -Blood cultures show no growth to date -Strep pneumonia urine Ag negative -Pending legionella urine antigen   Questionable PE/LLE DVT -Treatment plan as above, hypercoagulable panel pending  Coronary artery disease -Status post CABG on 11/02/2015 -Cardiothoracic surgery made aware of patient's admission -Troponin mildly elevated however flat, continue to trend -Continue aspirin, statin, beta blocker  Essential hypertension -Lisinopril held due to acute renal injury -Continue metoprolol and IV hydralazine as needed  Acute kidney injury -Likely secondary to sepsis and dehydration versus medication -Appears to have resolved, currently 0.8 -Continue to monitor BMP  GERD -Continue PPI  Depression  and anxiety -Continue Prozac and Ativan  Chronic normocytic anemia -Baseline hemoglobin approximately 10, currently 8.0 (drop dilutional?) -Continue to monitor CBC -Transfuse if Hb <7 -FOBT and anemia panel pending  Thrombocytopenia -?heparin vs vancomycin vs PPI -HIT panel pending -[place] were >300 10/29/15, and have steadily dropped since -Continue to monitor  CBC  Hyperkalemia -Resolved, continue to monitor BMP  Code Status: Full  Family Communication: None at bedside  Disposition Plan: Admitted. Continue to monitor in step down. Pending Echo and LE doppler  Time Spent in minutes   30 minutes  Procedures  VQ Scan LE doppler Echocardiogram  Consults   Cardiothoracic surgery - made aware of patient's admission  DVT Prophylaxis  Heparin- switched to Xarelto  Lab Results  Component Value Date   PLT 136* 11/17/2015    Medications  Scheduled Meds: . aspirin EC  325 mg Oral Daily  . ceFEPime (MAXIPIME) IV  1 g Intravenous 3 times per day  . dextromethorphan-guaiFENesin  1 tablet Oral BID  . diphenhydrAMINE  25 mg Intravenous Once  . FLUoxetine  20 mg Oral Daily  . levalbuterol  1.25 mg Nebulization QID  . metoprolol  25 mg Oral BID  . pantoprazole  40 mg Oral Daily  . vancomycin  1,250 mg Intravenous Q12H   Continuous Infusions: . sodium chloride 75 mL/hr at 11/17/15 0316  . heparin 1,700 Units/hr (11/17/15 0527)   PRN Meds:.acetaminophen, hydrALAZINE, HYDROmorphone (DILAUDID) injection, LORazepam, oxyCODONE, simethicone, technetium TC 41M diethylenetriame-pentaacetic acid  Antibiotics    Anti-infectives    Start     Dose/Rate Route Frequency Ordered Stop   11/15/15 2000  ceFEPIme (MAXIPIME) 1 g in dextrose 5 % 50 mL IVPB     1 g 100 mL/hr over 30 Minutes Intravenous 3 times per day 11/15/15 1958     11/15/15 2000  vancomycin (VANCOCIN) 1,250 mg in sodium chloride 0.9 % 250 mL IVPB     1,250 mg 166.7 mL/hr over 90 Minutes Intravenous Every 12 hours 11/15/15 1958        Subjective:   Brenda Cruz seen and examined today.  She continues to complain of pain, particularly on the right side, worse with deep inspiration and cough.  She denies chest pain, abdominal pain, nausea and vomiting. Feels his breathing is better.  Objective:   Filed Vitals:   11/17/15 0500 11/17/15 0600 11/17/15 0619 11/17/15 0752  BP:  132/68 130/71    Pulse: 98 98    Temp:      TempSrc:      Resp: 22 21    Height:      Weight:   100.562 kg (221 lb 11.2 oz)   SpO2: 93% 95%  100%    Wt Readings from Last 3 Encounters:  11/17/15 100.562 kg (221 lb 11.2 oz)  11/06/15 103.1 kg (227 lb 4.7 oz)  10/16/15 103.602 kg (228 lb 6.4 oz)     Intake/Output Summary (Last 24 hours) at 11/17/15 1014 Last data filed at 11/17/15 0900  Gross per 24 hour  Intake 1873.81 ml  Output    445 ml  Net 1428.81 ml    Exam  General: Well developed, well nourished, NAD  HEENT: NCAT,  mucous membranes moist.   Cardiovascular: S1 S2 auscultated, RRR  Respiratory: Slightly diminished, however Clear   Abdomen: Soft, obese, nontender, nondistended, + bowel sounds  Extremities: warm dry without cyanosis clubbing or edema  Neuro: AAOx3, nonfocal  Psych: Appropriate mood and affect, pleasant  Data Review  Micro Results Recent Results (from the past 240 hour(s))  Blood culture (routine x 2)     Status: None (Preliminary result)   Collection Time: 11/15/15  8:35 PM  Result Value Ref Range Status   Specimen Description BLOOD LEFT FOREARM  Final   Special Requests BOTTLES DRAWN AEROBIC AND ANAEROBIC 5CC   Final   Culture NO GROWTH < 24 HOURS  Final   Report Status PENDING  Incomplete    Radiology Reports Dg Chest 2 View  11/15/2015  CLINICAL DATA:  Right-sided chest tightness and shortness of breath EXAM: CHEST  2 VIEW COMPARISON:  11/04/2015 FINDINGS: Streaky and linear opacity in the lung bases is probably related atelectasis although pneumonia medial lung bases cannot be entirely excluded. Opacity seen posteriorly in the costophrenic sulcus on the lateral film. Lung volumes are low. Patient is status post median sternotomy. The visualized bony structures of the thorax are intact. IMPRESSION: Interval decrease in volume with interval development of bibasilar collapse/ consolidation. Electronically Signed   By: Misty Stanley M.D.    On: 11/15/2015 17:50   Dg Chest 2 View  10/29/2015  CLINICAL DATA:  Mid chest pressure off and on for 3 weeks with exertion, hypertension, asthma, smoker EXAM: CHEST  2 VIEW COMPARISON:  03/20/2014 ; correlation CT chest 10/13/2011 FINDINGS: Normal heart size, mediastinal contours, and pulmonary vascularity. Minimal central peribronchial thickening. Focus of scarring in lingula adjacent to LEFT heart border similar to prior CT. No acute infiltrate, pleural effusion or pneumothorax. Bones unremarkable. IMPRESSION: Bronchitic changes with lingular scarring. No acute abnormalities. Electronically Signed   By: Lavonia Dana M.D.   On: 10/29/2015 10:27   Ct Chest Wo Contrast  11/15/2015  CLINICAL DATA:  Shortness of breath. Right-sided back pain. Pneumonia. One week postop from CABG. EXAM: CT CHEST WITHOUT CONTRAST TECHNIQUE: Multidetector CT imaging of the chest was performed following the standard protocol without IV contrast. COMPARISON:  10/13/2011 FINDINGS: Mediastinum/Lymph Nodes: No masses or pathologically enlarged lymph nodes identified on this un-enhanced exam. No evidence of mediastinal hematoma. Recent postop changes from CABG noted. No evidence of pericardial effusion. Lungs/Pleura: Bilateral lower lobe atelectasis or infiltrates are seen. Small right pleural effusion noted. No evidence of pneumothorax. Upper abdomen: No acute findings. Musculoskeletal: No chest wall mass or suspicious bone lesions identified. IMPRESSION: Bilateral lower lobe atelectasis versus infiltrates and small right pleural effusion. Electronically Signed   By: Earle Gell M.D.   On: 11/15/2015 20:31   Nm Pulmonary Perf And Vent  11/16/2015  CLINICAL DATA:  Shortness of breath.  Pleuritic chest pain. EXAM: NUCLEAR MEDICINE VENTILATION - PERFUSION LUNG SCAN TECHNIQUE: Ventilation images were obtained in multiple projections using inhaled aerosol Tc-33m DTPA. Perfusion images were obtained in multiple projections after  intravenous injection of Tc-55m MAA. RADIOPHARMACEUTICALS:  31 millicurie [id number] DTPA aerosol inhalation and 4.1 millicurie [id number] MAA IV COMPARISON:  Chest x-ray and CT from earlier today FINDINGS: There is a moderate sized area of left upper lobe posterior segment matched defect. There is a large peripheral area of right lower lobe basilar segment perfusion defect which is triple matched (see RPO). IMPRESSION: Intermediate probability category (20-79%) by PIOPED II. Electronically Signed   By: Monte Fantasia M.D.   On: 11/16/2015 00:41   Dg Chest Port 1 View  11/04/2015  CLINICAL DATA:  CABG EXAM: PORTABLE CHEST 1 VIEW COMPARISON:  11/03/2015 FINDINGS: Swan-Ganz catheter removed. Left chest tube removed. Mediastinal drain removed. Central venous catheters have been removed. Negative for pneumothorax. Negative for  heart failure or effusion. Lungs are clear. IMPRESSION: Support lines have been removed.  No acute cardiopulmonary disease. Electronically Signed   By: Franchot Gallo M.D.   On: 11/04/2015 08:08   Dg Chest Port 1 View  11/03/2015  CLINICAL DATA:  Patient status post CABG procedure. EXAM: PORTABLE CHEST 1 VIEW COMPARISON:  Chest radiograph 11/02/2015. FINDINGS: PA catheter tip projects over the expected location the proximal right main pulmonary artery. Bilateral chest tubes in place. Interval extubation and removal of enteric tube. Multiple monitoring leads course over the patient. Stable cardiac and mediastinal contours status post median sternotomy. Focal opacity left lung base, unchanged from prior. Interval improvement in previously described perihilar interstitial opacities. No large pleural effusion or pneumothorax. IMPRESSION: Interval extubation. Probable focal atelectasis left lung base. Interval retraction pulmonary arterial catheter with tip projecting over the expected location main right pulmonary artery. Electronically Signed   By: Lovey Newcomer M.D.   On: 11/03/2015  08:14   Dg Chest Port 1 View  11/02/2015  CLINICAL DATA:  Status post CABG today.  Initial encounter. EXAM: PORTABLE CHEST 1 VIEW COMPARISON:  PA and lateral chest 10/29/2015. FINDINGS: Endotracheal tube is in place with the tip in good position at the level of the clavicular heads. Right IJ approach Swan-Ganz catheter is also in place with the tip in the the distal descending interlobar pulmonary artery on the right. Recommend withdrawal approximately 7.5 cm. Bilateral chest tubes are noted. No pneumothorax. Mild basilar atelectasis is seen. Six intact median sternotomy wires are identified. IMPRESSION: Right IJ approach Swan-Ganz catheter tip is in the distal descending interlobar pulmonary artery on the right. Recommend withdrawal of 7.5 cm. Support apparatus otherwise projects in good position. No pneumothorax. Bibasilar atelectasis. These results were called by telephone at the time of interpretation on 11/02/2015 at 12:24 pm to the patient's nurse Precious Bard, who verbally acknowledged these results. Electronically Signed   By: Inge Rise M.D.   On: 11/02/2015 12:28    CBC  Recent Labs Lab 11/15/15 1836 11/16/15 0918 11/17/15 0300  WBC 21.7* 18.2* 15.7*  HGB 10.8* 9.3* 8.0*  HCT 34.0* 28.5* 25.7*  PLT 208 151 136*  MCV 90.4 91.6 91.1  MCH 28.7 29.9 28.4  MCHC 31.8 32.6 31.1  RDW 13.1 13.5 13.4  LYMPHSABS 1.7  --   --   MONOABS 2.1*  --   --   EOSABS 0.1  --   --   BASOSABS 0.0  --   --     Chemistries   Recent Labs Lab 11/15/15 1836 11/16/15 0918 11/17/15 0300  NA 135 137 134*  K 5.0 5.2* 4.4  CL 97* 106 104  CO2 22 17* 22  GLUCOSE 174* 129* 129*  BUN 12 13 9   CREATININE 1.13* 0.80 0.79  CALCIUM 9.7 8.4* 8.2*  AST 24  --   --   ALT 17  --   --   ALKPHOS 75  --   --   BILITOT 0.9  --   --    ------------------------------------------------------------------------------------------------------------------ estimated creatinine clearance is 107.9 mL/min (by C-G  formula based on Cr of 0.79). ------------------------------------------------------------------------------------------------------------------ No results for input(s): HGBA1C in the last 72 hours. ------------------------------------------------------------------------------------------------------------------ No results for input(s): CHOL, HDL, LDLCALC, TRIG, CHOLHDL, LDLDIRECT in the last 72 hours. ------------------------------------------------------------------------------------------------------------------ No results for input(s): TSH, T4TOTAL, T3FREE, THYROIDAB in the last 72 hours.  Invalid input(s): FREET3 ------------------------------------------------------------------------------------------------------------------ No results for input(s): VITAMINB12, FOLATE, FERRITIN, TIBC, IRON, RETICCTPCT in the last 72 hours.  Coagulation profile  Recent Labs Lab 11/16/15 0114  INR 1.36    No results for input(s): DDIMER in the last 72 hours.  Cardiac Enzymes  Recent Labs Lab 11/16/15 0114 11/16/15 0506 11/16/15 0918  TROPONINI 0.05* 0.05* 0.04*   ------------------------------------------------------------------------------------------------------------------ Invalid input(s): POCBNP    Zenda Herskowitz D.O. on 11/17/2015 at 10:14 AM  Between 7am to 7pm - Pager - [phone]  After 7pm go to [email or web address] - password [email or web address]  And look for the night coverage person covering for me after hours  [facility] Office  [phone]

## 2015-11-17 NOTE — Progress Notes (Signed)
ANTICOAGULATION CONSULT NOTE - Follow Up Consult  Pharmacy Consult for heparin Indication: pulmonary embolus   Labs:  Recent Labs  11/15/15 1836 11/16/15 0114 11/16/15 0506 11/16/15 0918 11/16/15 1953 11/17/15 0300  HGB 10.8*  --   --  9.3*  --  8.0*  HCT 34.0*  --   --  28.5*  --  25.7*  PLT 208  --   --  151  --  136*  APTT  --  44*  --   --   --   --   LABPROT  --  16.9*  --   --   --   --   INR  --  1.36  --   --   --   --   HEPARINUNFRC  --   --   --  0.32 0.29* 0.21*  CREATININE 1.13*  --   --  0.80  --   --   TROPONINI  --  0.05* 0.05* 0.04*  --   --     Assessment: 49yo female now subtherapeutic on heparin with lower level despite increased rate.  Goal of Therapy:  Heparin level 0.3-0.7 units/ml   Plan:  Will rebolus with heparin 2000 units x1 and increase gtt by ~2-3 units/kg/hr to 1700 units/hr and check level in 6hr.  Wynona Neat, PharmD, BCPS  11/17/2015,5:11 AM

## 2015-11-17 NOTE — Progress Notes (Signed)
OT Cancellation Note  Patient Details Name: DAYZHA FLATLEY MRN: [id number] DOB: 1966/07/18   Cancelled Treatment:    Reason Eval/Treat Not Completed: Patient declined, no reason specified Pt declined and new dx of DVT per chart in [facility]  Vonita Moss   OTR/L Pager: [phone] Office: [phone] .  11/17/2015, 11:06 AM

## 2015-11-17 NOTE — Progress Notes (Signed)
*  Preliminary Results* Bilateral lower extremity venous duplex completed. The right lower extremity is negative for deep vein thrombosis. The left lower extremity is positive for deep vein thrombosis involving the left distal femoral vein, popliteal vein, and posterior tibial veins.  Preliminary results discussed with Dr. Ree Kida.  11/17/2015  Maudry Mayhew, RVT, RDCS, RDMS

## 2015-11-17 NOTE — Progress Notes (Signed)
  Echocardiogram 2D Echocardiogram has been performed.  Argie Applegate, Alexah 11/17/2015, 9:15 AM

## 2015-11-17 NOTE — Progress Notes (Signed)
PT Cancellation Note  Patient Details Name: Brenda Cruz MRN: [id number] DOB: [birth year]   Cancelled Treatment:    Reason Eval/Treat Not Completed: Medical issues which prohibited therapy. Pt with new L LE DVT, on heparin drip. Pt in a lot pain and just received pain medicine and now asleep. PT to return as able.   Kingsley Callander 11/17/2015, 1:08 PM  Kittie Plater, PT, DPT Pager #: [phone] Office #: [phone]

## 2015-11-17 NOTE — Progress Notes (Signed)
ANTICOAGULATION CONSULT NOTE - Follow Up Consult  Pharmacy Consult for heparin --> Xarelto Indication: pulmonary embolus   Patient Measurements: Height: 5\' 8"  (172.7 cm) Weight: 221 lb 11.2 oz (100.562 kg) IBW/kg (Calculated) : 63.9 Heparin Dosing Weight: 85kg  Vital Signs: Temp: 98.1 F (36.7 C) (01/31 0400) Temp Source: Oral (01/31 0400) BP: 137/77 mmHg (01/31 1000) Pulse Rate: 96 (01/31 1000)  Labs:  Recent Labs  11/15/15 1836 11/16/15 0114 11/16/15 0506  11/16/15 0918 11/16/15 1953 11/17/15 0300 11/17/15 0915  HGB 10.8*  --   --   --  9.3*  --  8.0*  --   HCT 34.0*  --   --   --  28.5*  --  25.7*  --   PLT 208  --   --   --  151  --  136*  --   APTT  --  44*  --   --   --   --   --   --   LABPROT  --  16.9*  --   --   --   --   --   --   INR  --  1.36  --   --   --   --   --   --   HEPARINUNFRC  --   --   --   < > 0.32 0.29* 0.21* 0.41  CREATININE 1.13*  --   --   --  0.80  --  0.79  --   TROPONINI  --  0.05* 0.05*  --  0.04*  --   --   --   < > = values in this interval not displayed.  Estimated Creatinine Clearance: 107.9 mL/min (by C-G formula based on Cr of 0.79).    Assessment: 50 yo female c/o right flank and right lower CP w/ SOB and fever, CP aggravated w/ coughing and deep inspiration, VQ scan shows intermediate probability of PE. Had CABG x1 on 1/16. Currently on IV heparin but spoke with Dr. Ree Kida and ready to transition to [place]. No bleeding noted; Hb and platelets are trending down though. Could be dilutional. 4T score is high because she received heparin products around her CABG and has now been exposed again. Platelets not quite at 50% of admit level. Also now has confirmed clot. Inclined to see platelet trend tomorrow now that starting Xarelto.  Goal of Therapy:  full anticoagulation Monitor platelets by anticoagulation protocol: Yes   Plan:  Discontinue heparin drip Xarelto 15 mg PO bid with meals for 21 days then 20 mg qdws Monitor CBC,  s/s of bleed Hold off on HIT Ab until see platelet trend tomorrow  [place], Pharm.D., BCPS Clinical Pharmacist Pager: [phone] 11/17/2015 11:15 AM

## 2015-11-18 ENCOUNTER — Ambulatory Visit: Payer: BLUE CROSS/BLUE SHIELD | Admitting: Physician Assistant

## 2015-11-18 DIAGNOSIS — J189 Pneumonia, unspecified organism: Secondary | ICD-10-CM

## 2015-11-18 DIAGNOSIS — R000 Tachycardia, unspecified: Secondary | ICD-10-CM

## 2015-11-18 DIAGNOSIS — I2699 Other pulmonary embolism without acute cor pulmonale: Secondary | ICD-10-CM

## 2015-11-18 DIAGNOSIS — I82409 Acute embolism and thrombosis of unspecified deep veins of unspecified lower extremity: Secondary | ICD-10-CM

## 2015-11-18 DIAGNOSIS — D509 Iron deficiency anemia, unspecified: Secondary | ICD-10-CM

## 2015-11-18 DIAGNOSIS — D696 Thrombocytopenia, unspecified: Secondary | ICD-10-CM

## 2015-11-18 DIAGNOSIS — D72829 Elevated white blood cell count, unspecified: Secondary | ICD-10-CM

## 2015-11-18 LAB — LUPUS ANTICOAGULANT PANEL
DRVVT: 82.1 s — ABNORMAL HIGH (ref 0.0–44.0)
PTT Lupus Anticoagulant: 110 s — ABNORMAL HIGH (ref 0.0–40.6)

## 2015-11-18 LAB — DRVVT MIX: DRVVT MIX: 55.2 s — AB (ref 0.0–44.0)

## 2015-11-18 LAB — PROTEIN C ACTIVITY: PROTEIN C ACTIVITY: 112 % (ref 73–180)

## 2015-11-18 LAB — CBC
HEMATOCRIT: 25 % — AB (ref 36.0–46.0)
Hemoglobin: 8.1 g/dL — ABNORMAL LOW (ref 12.0–15.0)
MCH: 29.6 pg (ref 26.0–34.0)
MCHC: 32.4 g/dL (ref 30.0–36.0)
MCV: 91.2 fL (ref 78.0–100.0)
PLATELETS: 125 10*3/uL — AB (ref 150–400)
RBC: 2.74 MIL/uL — ABNORMAL LOW (ref 3.87–5.11)
RDW: 13.5 % (ref 11.5–15.5)
WBC: 9.4 10*3/uL (ref 4.0–10.5)

## 2015-11-18 LAB — BASIC METABOLIC PANEL
Anion gap: 10 (ref 5–15)
BUN: 8 mg/dL (ref 6–20)
CALCIUM: 8.6 mg/dL — AB (ref 8.9–10.3)
CO2: 23 mmol/L (ref 22–32)
Chloride: 103 mmol/L (ref 101–111)
Creatinine, Ser: 0.81 mg/dL (ref 0.44–1.00)
GFR calc Af Amer: >60 mL/min (ref >60)
GLUCOSE: 164 mg/dL — AB (ref 65–99)
Potassium: 4.5 mmol/L (ref 3.5–5.1)
Sodium: 136 mmol/L (ref 135–145)

## 2015-11-18 LAB — URINE CULTURE

## 2015-11-18 LAB — LEGIONELLA ANTIGEN, URINE

## 2015-11-18 LAB — PROTEIN C, TOTAL: PROTEIN C, TOTAL: 75 % (ref 60–150)

## 2015-11-18 LAB — PTT-LA MIX: PTT-LA Mix: 93 s — ABNORMAL HIGH (ref 0.0–40.6)

## 2015-11-18 LAB — UREA NITROGEN, URINE: Urea Nitrogen, Ur: 883 mg/dL

## 2015-11-18 LAB — PROTEIN S ACTIVITY: Protein S Activity: 66 % (ref 63–140)

## 2015-11-18 LAB — DRVVT CONFIRM: DRVVT CONFIRM: 1.5 ratio — AB (ref 0.8–1.2)

## 2015-11-18 LAB — BETA-2-GLYCOPROTEIN I ABS, IGG/M/A
Beta-2-Glycoprotein I IgA: <9 GPI IgA units (ref 0–25)
Beta-2-Glycoprotein I IgM: <9 GPI IgM units (ref 0–32)

## 2015-11-18 LAB — PROTEIN S, TOTAL: PROTEIN S AG TOTAL: 116 % (ref 60–150)

## 2015-11-18 LAB — HEXAGONAL PHASE PHOSPHOLIPID: Hexagonal Phase Phospholipid: 24 s — ABNORMAL HIGH (ref 0–11)

## 2015-11-18 MED ORDER — FERROUS SULFATE 325 (65 FE) MG PO TABS
325.0000 mg | ORAL_TABLET | Freq: Two times a day (BID) | ORAL | Status: DC
  Administered 2015-11-19 – 2015-11-21 (×5): 325 mg via ORAL
  Filled 2015-11-18 (×5): qty 1

## 2015-11-18 MED ORDER — DOCUSATE SODIUM 100 MG PO CAPS
100.0000 mg | ORAL_CAPSULE | Freq: Two times a day (BID) | ORAL | Status: DC
  Administered 2015-11-18 – 2015-11-21 (×6): 100 mg via ORAL
  Filled 2015-11-18 (×6): qty 1

## 2015-11-18 MED ORDER — PRAVASTATIN SODIUM 40 MG PO TABS
40.0000 mg | ORAL_TABLET | Freq: Every day | ORAL | Status: DC
  Administered 2015-11-18 – 2015-11-20 (×3): 40 mg via ORAL
  Filled 2015-11-18 (×3): qty 1

## 2015-11-18 MED ORDER — SODIUM CHLORIDE 0.9 % IV SOLN
510.0000 mg | Freq: Once | INTRAVENOUS | Status: AC
  Administered 2015-11-18: 510 mg via INTRAVENOUS
  Filled 2015-11-18: qty 17

## 2015-11-18 MED ORDER — ASPIRIN EC 81 MG PO TBEC
81.0000 mg | DELAYED_RELEASE_TABLET | Freq: Every day | ORAL | Status: DC
  Administered 2015-11-19 – 2015-11-21 (×3): 81 mg via ORAL
  Filled 2015-11-18 (×3): qty 1

## 2015-11-18 MED ORDER — HYDROCORTISONE 2.5 % RE CREA
TOPICAL_CREAM | Freq: Two times a day (BID) | RECTAL | Status: DC
  Administered 2015-11-18 (×2): via RECTAL
  Administered 2015-11-19: 1 via RECTAL
  Administered 2015-11-19 – 2015-11-20 (×2): via RECTAL
  Filled 2015-11-18: qty 28.35

## 2015-11-18 MED ORDER — FONDAPARINUX SODIUM 10 MG/0.8ML ~~LOC~~ SOLN
10.0000 mg | Freq: Every day | SUBCUTANEOUS | Status: DC
  Administered 2015-11-19 – 2015-11-20 (×2): 10 mg via SUBCUTANEOUS
  Filled 2015-11-18 (×3): qty 0.8

## 2015-11-18 NOTE — Progress Notes (Signed)
[facility] CONSULT NOTE  Patient Care Team: Pcp Not In System as PCP - General Lonn Georgia, PA-C as Physician Assistant (Cardiology)  CHIEF COMPLAINTS/PURPOSE OF CONSULTATION:  Left lower extremity, probable PE, recent surgery, severe iron deficiency anemia, new thrombocytopenia, ?HIT  HISTORY OF PRESENTING ILLNESS:  Brenda Cruz 50 y.o. female was admitted recently after presentation with fever, dyspnea and new onset right pleuritic chest pain. She was just discharged from the hospital after CABG. Prior to admission, she complained of new onset of left lower extremity discomfort and right sided pleuritic chest pain. She quit smoking recently. On the day of admission she was found to have abnormal CBC. On 1/29, WBC was 21.7, hemoglobin was 10.8 and platelet count was 208.  She had fever and multiple investigations On 11/15/15: CXR showed bibasilar consolidation. CT scan of chest without contrast showed evidence bilateral lobe atelectasis. She was placed on Iv antibiotics. On 11/16/15: VQ scan showed high probability of PE with LUL moderate defect and RLL defect with perfusion mismatched On 11/17/15: Left lower extremity Doppler was positive for acute DVT affecting left femoral, popliteal and posterior tibial veins On 11/17/15: ECHO showed normal EF without evidence of right heart strain She was started on heparin and had hypercoagulable panel drawn. Results is inconclusive for lupus anticoagulant  Since admission, her CBC changed. She has improvement of leukocytosis with antibiotic therapy. She got progressively anemic and was found to have iron deficiency anemia. She received IV iron.  Platelet count dropped to 125 today and was stopped due to suspected HIT. She was started on Xarelto yesterday   In term of history of blood clot, She was recently hospitalized from 1/12 to 1/20 for near syncopal episode and was later found to have severe disease in LAD, s/p CABG with LIMA  to LAD on 11/02/15.  Her operative record showed she was on bypass machine for 53 minutes. Her platelet count preoperatively was 254. By the day of discharge her platelet count was 175.  She had no prior history or diagnosis of cancer. Her age appropriate screening programs are up-to-date. She had prior surgeries before and never had perioperative thromboembolic events. The patient had been exposed to birth control pills and fertility treatment and never had thrombotic events. The patient had been pregnant before and denies history of peripartum thromboembolic event or history of recurrent miscarriages. There is no family history of blood clots or miscarriages.  In terms of anemia, she had iron deficiency anemia during her pregnancy and had taken iron supplement before. She has history of menorrhagia. She had donated blood once but never received blood transfusion to her knowledge.  MEDICAL HISTORY:  Past Medical History  Diagnosis Date  . Asthma   . GERD (gastroesophageal reflux disease)   . DDD (degenerative disc disease)   . Hyperlipemia   . Anxiety   . Hemorrhoids   . TMJ (temporomandibular joint disorder)     SURGICAL HISTORY: Past Surgical History  Procedure Laterality Date  . Cholecystectomy    . Lumbar fusion    . Cardiac catheterization N/A 10/29/2015    Procedure: Left Heart Cath and Coronary Angiography;  Surgeon: [name], MD;  Location: [facility];  Service: Cardiovascular;  Laterality: N/A;  . Coronary artery bypass graft N/A 11/02/2015    Procedure: CORONARY ARTERY BYPASS GRAFTING (CABG)x 1 using left internal mammary artery.;  Surgeon: Melrose Nakayama, MD;  Location: [facility];  Service: Open Heart Surgery;  Laterality: N/A;  .  Tee without cardioversion N/A 11/02/2015    Procedure: TRANSESOPHAGEAL ECHOCARDIOGRAM (TEE);  Surgeon: Melrose Nakayama, MD;  Location: [facility];  Service: Open Heart Surgery;  Laterality: N/A;    SOCIAL HISTORY: Social History    Social History  . Marital Status: Married    Spouse Name: N/A  . Number of Children: 1  . Years of Education: N/A   Occupational History  . Medical Billing    Social History Main Topics  . Smoking status: Current Every Day Smoker  . Smokeless tobacco: Never Used     Comment: Counseling paper for smoking given to patient in exam room   . Alcohol Use: No  . Drug Use: No  . Sexual Activity: Not on file   Other Topics Concern  . Not on file   Social History Narrative   2 caffeine drinks daily     FAMILY HISTORY: Family History  Problem Relation Age of Onset  . Colon cancer Neg Hx   . Colon polyps Father   . Stomach cancer Paternal Grandmother   . Esophageal cancer Maternal Grandfather   . Breast cancer Mother   . Ovarian cancer      Maternal Side Great  Aunt    ALLERGIES:  is allergic to atorvastatin; iohexol; penicillins; sulfa antibiotics; and zithromax.  MEDICATIONS:  Current Facility-Administered Medications  Medication Dose Route Frequency Provider Last Rate Last Dose  . acetaminophen (TYLENOL) tablet 650 mg  650 mg Oral Q6H PRN Ivor Costa, MD   650 mg at 11/15/15 2230  . [START ON 11/19/2015] aspirin EC tablet 81 mg  81 mg Oral Daily Maryann Mikhail, DO      . ceFEPIme (MAXIPIME) 1 g in dextrose 5 % 50 mL IVPB  1 g Intravenous 3 times per day Otilio Miu, RPH 100 mL/hr at 11/18/15 1252 1 g at 11/18/15 1252  . dextromethorphan-guaiFENesin (MUCINEX DM) 30-600 MG per 12 hr tablet 1 tablet  1 tablet Oral BID Ivor Costa, MD   1 tablet at 11/18/15 0934  . diphenhydrAMINE (BENADRYL) injection 25 mg  25 mg Intravenous Once [employer], DO   25 mg at 11/17/15 1000  . docusate sodium (COLACE) capsule 100 mg  100 mg Oral BID Maryann Mikhail, DO      . [START ON 11/19/2015] ferrous sulfate tablet 325 mg  325 mg Oral BID WC Maryann Mikhail, DO      . FLUoxetine (PROZAC) capsule 20 mg  20 mg Oral Daily Ivor Costa, MD   20 mg at 11/18/15 0934  . [START ON 11/19/2015]  fondaparinux (ARIXTRA) injection 10 mg  10 mg Subcutaneous Q0600 Maryann Mikhail, DO      . hydrALAZINE (APRESOLINE) injection 5 mg  5 mg Intravenous Q2H PRN Ivor Costa, MD      . hydrocortisone (ANUSOL-HC) 2.5 % rectal cream   Rectal BID Maryann Mikhail, DO      . HYDROmorphone (DILAUDID) injection 1 mg  1 mg Intravenous Q3H PRN Ivor Costa, MD   1 mg at 11/17/15 1037  . levalbuterol (XOPENEX) nebulizer solution 1.25 mg  1.25 mg Nebulization QID Ivor Costa, MD   1.25 mg at 11/18/15 1545  . LORazepam (ATIVAN) tablet 1 mg  1 mg Oral BID PRN Ivor Costa, MD   1 mg at 11/17/15 [id number]  . metoprolol tartrate (LOPRESSOR) tablet 25 mg  25 mg Oral BID Ivor Costa, MD   25 mg at 11/18/15 0934  . oxyCODONE (Oxy IR/ROXICODONE) immediate release tablet 5-10 mg  5-10 mg Oral Q3H PRN Ivor Costa, MD   10 mg at 11/18/15 1420  . pantoprazole (PROTONIX) EC tablet 40 mg  40 mg Oral Daily Ivor Costa, MD   40 mg at 11/18/15 0934  . pravastatin (PRAVACHOL) tablet 40 mg  40 mg Oral q1800 Maryann Mikhail, DO   40 mg at 11/18/15 1800  . simethicone (MYLICON) chewable tablet 80 mg  80 mg Oral Q6H PRN Ivor Costa, MD   80 mg at 11/15/15 2306  . technetium TC 40M diethylenetriame-pentaacetic acid (DTPA) injection 31 milli Curie  31 milli Curie Intravenous Once PRN Ivor Costa, MD        REVIEW OF SYSTEMS:   Constitutional: Denies fevers, chills or abnormal night sweats Eyes: Denies blurriness of vision, double vision or watery eyes Ears, nose, mouth, throat, and face: Denies mucositis or sore throat Gastrointestinal:  Denies nausea, heartburn or change in bowel habits Skin: Denies abnormal skin rashes Lymphatics: Denies new lymphadenopathy or easy bruising Neurological:Denies numbness, tingling or new weaknesses Behavioral/Psych: Mood is stable, no new changes  All other systems were reviewed with the patient and are negative.  PHYSICAL EXAMINATION: ECOG PERFORMANCE STATUS: 2 - Symptomatic, <50% confined to bed  Filed Vitals:    11/18/15 1600 11/18/15 1745  BP: 131/63 118/60  Pulse: 99 107  Temp: 97.8 F (36.6 C) 98.6 F (37 C)  Resp: 18 18   Filed Weights   11/15/15 2229 11/15/15 2232 11/17/15 0619  Weight: 210 lb (95.255 kg) 210 lb (95.255 kg) 221 lb 11.2 oz (100.562 kg)    GENERAL:alert, no distress and comfortable. She is morbidly obese SKIN: skin color, texture, turgor are normal, no rashes or significant lesions. She looked pale EYES: normal, conjunctiva are pale and non-injected, sclera clear OROPHARYNX:no exudate, no erythema and lips, buccal mucosa, and tongue normal  NECK: supple, thyroid normal size, non-tender, without nodularity LYMPH:  no palpable lymphadenopathy in the cervical, axillary or inguinal LUNGS: clear to auscultation and percussion with normal breathing effort HEART: regular rate & rhythm and no murmurs and no lower extremity edema ABDOMEN:abdomen soft, non-tender and normal bowel sounds Musculoskeletal:no cyanosis of digits and no clubbing  PSYCH: alert & oriented x 3 with fluent speech NEURO: no focal motor/sensory deficits  LABORATORY DATA:  I have reviewed the data as listed Lab Results  Component Value Date   WBC 9.4 11/18/2015   HGB 8.1* 11/18/2015   HCT 25.0* 11/18/2015   MCV 91.2 11/18/2015   PLT 125* 11/18/2015   I reviewed her hypercoag panel  RADIOGRAPHIC STUDIES: I have personally reviewed the radiological images as listed and agreed with the findings in the report. Dg Chest 2 View  11/15/2015  CLINICAL DATA:  Right-sided chest tightness and shortness of breath EXAM: CHEST  2 VIEW COMPARISON:  11/04/2015 FINDINGS: Streaky and linear opacity in the lung bases is probably related atelectasis although pneumonia medial lung bases cannot be entirely excluded. Opacity seen posteriorly in the costophrenic sulcus on the lateral film. Lung volumes are low. Patient is status post median sternotomy. The visualized bony structures of the thorax are intact. IMPRESSION:  Interval decrease in volume with interval development of bibasilar collapse/ consolidation. Electronically Signed   By: Misty Stanley M.D.   On: 11/15/2015 17:50   Dg Chest 2 View  10/29/2015  CLINICAL DATA:  Mid chest pressure off and on for 3 weeks with exertion, hypertension, asthma, smoker EXAM: CHEST  2 VIEW COMPARISON:  03/20/2014 ; correlation CT chest 10/13/2011 FINDINGS: Normal  heart size, mediastinal contours, and pulmonary vascularity. Minimal central peribronchial thickening. Focus of scarring in lingula adjacent to LEFT heart border similar to prior CT. No acute infiltrate, pleural effusion or pneumothorax. Bones unremarkable. IMPRESSION: Bronchitic changes with lingular scarring. No acute abnormalities. Electronically Signed   By: Lavonia Dana M.D.   On: 10/29/2015 10:27   Ct Chest Wo Contrast  11/15/2015  CLINICAL DATA:  Shortness of breath. Right-sided back pain. Pneumonia. One week postop from CABG. EXAM: CT CHEST WITHOUT CONTRAST TECHNIQUE: Multidetector CT imaging of the chest was performed following the standard protocol without IV contrast. COMPARISON:  10/13/2011 FINDINGS: Mediastinum/Lymph Nodes: No masses or pathologically enlarged lymph nodes identified on this un-enhanced exam. No evidence of mediastinal hematoma. Recent postop changes from CABG noted. No evidence of pericardial effusion. Lungs/Pleura: Bilateral lower lobe atelectasis or infiltrates are seen. Small right pleural effusion noted. No evidence of pneumothorax. Upper abdomen: No acute findings. Musculoskeletal: No chest wall mass or suspicious bone lesions identified. IMPRESSION: Bilateral lower lobe atelectasis versus infiltrates and small right pleural effusion. Electronically Signed   By: Earle Gell M.D.   On: 11/15/2015 20:31   Nm Pulmonary Perf And Vent  11/16/2015  CLINICAL DATA:  Shortness of breath.  Pleuritic chest pain. EXAM: NUCLEAR MEDICINE VENTILATION - PERFUSION LUNG SCAN TECHNIQUE: Ventilation images were  obtained in multiple projections using inhaled aerosol Tc-33m DTPA. Perfusion images were obtained in multiple projections after intravenous injection of Tc-83m MAA. RADIOPHARMACEUTICALS:  31 millicurie [id number] DTPA aerosol inhalation and 4.1 millicurie [id number] MAA IV COMPARISON:  Chest x-ray and CT from earlier today FINDINGS: There is a moderate sized area of left upper lobe posterior segment matched defect. There is a large peripheral area of right lower lobe basilar segment perfusion defect which is triple matched (see RPO). IMPRESSION: Intermediate probability category (20-79%) by PIOPED II. Electronically Signed   By: Monte Fantasia M.D.   On: 11/16/2015 00:41   Dg Chest Port 1 View  11/04/2015  CLINICAL DATA:  CABG EXAM: PORTABLE CHEST 1 VIEW COMPARISON:  11/03/2015 FINDINGS: Swan-Ganz catheter removed. Left chest tube removed. Mediastinal drain removed. Central venous catheters have been removed. Negative for pneumothorax. Negative for heart failure or effusion. Lungs are clear. IMPRESSION: Support lines have been removed.  No acute cardiopulmonary disease. Electronically Signed   By: Franchot Gallo M.D.   On: 11/04/2015 08:08   Dg Chest Port 1 View  11/03/2015  CLINICAL DATA:  Patient status post CABG procedure. EXAM: PORTABLE CHEST 1 VIEW COMPARISON:  Chest radiograph 11/02/2015. FINDINGS: PA catheter tip projects over the expected location the proximal right main pulmonary artery. Bilateral chest tubes in place. Interval extubation and removal of enteric tube. Multiple monitoring leads course over the patient. Stable cardiac and mediastinal contours status post median sternotomy. Focal opacity left lung base, unchanged from prior. Interval improvement in previously described perihilar interstitial opacities. No large pleural effusion or pneumothorax. IMPRESSION: Interval extubation. Probable focal atelectasis left lung base. Interval retraction pulmonary arterial catheter with tip  projecting over the expected location main right pulmonary artery. Electronically Signed   By: Lovey Newcomer M.D.   On: 11/03/2015 08:14   Dg Chest Port 1 View  11/02/2015  CLINICAL DATA:  Status post CABG today.  Initial encounter. EXAM: PORTABLE CHEST 1 VIEW COMPARISON:  PA and lateral chest 10/29/2015. FINDINGS: Endotracheal tube is in place with the tip in good position at the level of the clavicular heads. Right IJ approach Swan-Ganz catheter is also in place with  the tip in the the distal descending interlobar pulmonary artery on the right. Recommend withdrawal approximately 7.5 cm. Bilateral chest tubes are noted. No pneumothorax. Mild basilar atelectasis is seen. Six intact median sternotomy wires are identified. IMPRESSION: Right IJ approach Swan-Ganz catheter tip is in the distal descending interlobar pulmonary artery on the right. Recommend withdrawal of 7.5 cm. Support apparatus otherwise projects in good position. No pneumothorax. Bibasilar atelectasis. These results were called by telephone at the time of interpretation on 11/02/2015 at 12:24 pm to the patient's nurse Precious Bard, who verbally acknowledged these results. Electronically Signed   By: Inge Rise M.D.   On: 11/02/2015 12:28    ASSESSMENT & PLAN:   1) Left lower extremity DVT +/- bilateral PE I reviewed with the patient about the plan for care for provoked DVT.  It is still unclear whether she had bilateral PE but due to tachycardia, chest pain and abnormal VQ scan, she is being treated for presumed bilateral PE as well.  Her hypercoagulation panel is non-diagnostic (in the setting of IV heparin).  We discussed about various options of anticoagulation therapies including warfarin, low molecular weight heparin such as Lovenox or newer agents such as Rivaroxaban. Some of the risks and benefits discussed including costs involved, the need for monitoring, risks of life-threatening bleeding/hospitalization, reversibility of each agent  in the event of bleeding or overdose, safety profile of each drug and taking into account other social issues such as ease of administration of medications, etc. Ultimately, we have made an informed decision for the patient to continue her treatment with Arixtra for now pending work-up to exclude possibility of HIT  2) Iron deficiency anemia She had received iV iron and would benefit from oral iron replacement therapy.  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 The acute drop of platelet count could be related to treatment with cefepime, vancomycin and non-immune mediated reaction to heparin. Additional testing for HIT in progress. As long as her platelet count is >50,000 there is no contraindication for anticoagulation therapy or antiplatelet therapy  5) Hospital acquired pneumonia Leukocytosis is improving. Consider simplifying antibiotics regimen. Levofloxacin would be a good choice with no effect on platelet count but will defer to pharmacy/primary service  Will follow    [facility], [place], MD 11/18/2015 6:36 PM

## 2015-11-18 NOTE — Evaluation (Addendum)
Physical Therapy Evaluation Patient Details Name: Brenda Cruz MRN: [id number] DOB: 08-27-1966 Today's Date: 11/18/2015   History of Present Illness  50 yo female admitted with PE and L LE DVT Pt recently hospitalized 10/29/15-11/06/15 PMH: CABG 11/02/15, CAD, GERD, Depression, anxiety, DDD, TMG, asthma  Clinical Impression  Pt admitted with above diagnosis. Pt currently with functional limitations due to the deficits listed below (see PT Problem List). Pt was able to ambulate well overall with some pain limiting pt as she needed standing rest breaks and ultimately had to place on 2LO2 for activity.  Some safety issues due to breathing issues.  Should be able to go home with family support as she states they can provide 24 hour care.  Will follow acutely.    Pt will benefit from skilled PT to increase their independence and safety with mobility to allow discharge to the venue listed below.      Follow Up Recommendations Home health PT;Supervision/Assistance - 24 hour    Equipment Recommendations  Other (comment) (TBA)    Recommendations for Other Services       Precautions / Restrictions Precautions Precautions: Other (comment);Sternal (recent CABG) Precaution Comments: oxygen monitoring Restrictions Weight Bearing Restrictions: No      Mobility  Bed Mobility Overal bed mobility: Needs Assistance Bed Mobility: Rolling;Supine to Sit Rolling: Min guardNeeded cues for sternal precautions but she states she has trouble and husband helps her at home.     Supine to sit: Min guard;HOB elevated     General bed mobility comments: Pt reports sleeping in a recliner and requires (A) from family to complete any transfer. Pt with HOB elevated able to complete transfer without pulling/ pushing  Transfers Overall transfer level: Needs assistance Equipment used: None Transfers: Sit to/from Stand Sit to Stand: Min assist         General transfer comment: declined use of heart pillow for  transfers but present in the room.  Pt needed min assist for safety as pt with safety issues due to right flank pain.  Impulsive at times.    Ambulation/Gait Ambulation/Gait assistance: Min guard;+2 safety/equipment Ambulation Distance (Feet): 225 Feet Assistive device: None Gait Pattern/deviations: Step-through pattern;Decreased stride length   Gait velocity interpretation: <1.8 ft/sec, indicative of risk for recurrent falls General Gait Details: Pt was able to ambulate without device with overall good safety.  Pt frequently desat with need for rest breaks and did replace O2 at 2L as sats eventually hovering at 87-89% on RA at rest.  Poor endurance with DOE 2/4.    Stairs Stairs: Yes Stairs assistance: Min guard Stair Management: Alternating pattern;Forwards;One rail Left Number of Stairs: 3 General stair comments: Pt was able to perform stairs with min guard assist without LOB but was DOE2/4  Wheelchair Mobility    Modified Rankin (Stroke Patients Only)       Balance Overall balance assessment: Needs assistance         Standing balance support: No upper extremity supported;During functional activity Standing balance-Leahy Scale: Fair Standing balance comment: pt can stand statically without Ue support to bbrush teeth.              High level balance activites: Direction changes;Turns;Sudden stops High Level Balance Comments: min guard assist only.  Cannot withstand challenges but does not lose balance in controlled envrironment.              Pertinent Vitals/Pain Pain Assessment: Faces Faces Pain Scale: Hurts whole lot Pain Location: right flank pain Pain  Descriptors / Indicators: Grimacing;Guarding Pain Intervention(s): Limited activity within patient's tolerance;Monitored during session;Premedicated before session;Repositioned    SATURATION QUALIFICATIONS: (This note is used to comply with regulatory documentation for home oxygen)  Patient Saturations on  Room Air at Rest = 88-92%  Patient Saturations on Room Air while Ambulating = 86-89%  Patient Saturations on 2 Liters of oxygen while Ambulating = 90-93%  Please briefly explain why patient needs home oxygen:Pt desat if off O2 for a length of time at rest and with activity. May need home O2.  Did encourage incentive spirometer and pursed lip breathing.   Home Living Family/patient expects to be discharged to:: Private residence Living Arrangements: Spouse/significant other Available Help at Discharge: Family;Available 24 hours/day Type of Home: House Home Access: Stairs to enter Entrance Stairs-Rails: Left Entrance Stairs-Number of Steps: 3 Home Layout: One level Home Equipment: None Additional Comments: mother is retired and helping, pt has 57 year old son at home and husband taking time off work to help patient     Prior Function Level of Independence: Independent with assistive device(s)         Comments: recent (A) required due to s/p CABG     Hand Dominance   Dominant Hand: Right    Extremity/Trunk Assessment   Upper Extremity Assessment: Defer to OT evaluation           Lower Extremity Assessment: Generalized weakness      Cervical / Trunk Assessment: Normal  Communication   Communication: No difficulties  Cognition Arousal/Alertness: Awake/alert Behavior During Therapy: WFL for tasks assessed/performed Overall Cognitive Status: Within Functional Limits for tasks assessed                      General Comments General comments (skin integrity, edema, etc.): Pt reports frustration that this happended to her and that she can't care for her 25 year old son.  Pt states she cannot get a good breath due to right flank pain.  Encouraged incentive spirometer with pt and she c/o she can't get it above 550 but pt practiced with PT inroom and was getting close.  Encouraged pt to continue to practice.  Pt agreed.     Exercises        Assessment/Plan     PT Assessment Patient needs continued PT services  PT Diagnosis Generalized weakness;Acute pain   PT Problem List Decreased activity tolerance;Decreased balance;Decreased mobility;Decreased strength;Decreased knowledge of use of DME;Decreased safety awareness;Decreased knowledge of precautions;Pain;Cardiopulmonary status limiting activity  PT Treatment Interventions DME instruction;Gait training;Functional mobility training;Therapeutic activities;Therapeutic exercise;Balance training;Patient/family education;Stair training   PT Goals (Current goals can be found in the Care Plan section) Acute Rehab PT Goals Patient Stated Goal: to take care of 9 yo son PT Goal Formulation: With patient Time For Goal Achievement: 12/02/15 Potential to Achieve Goals: Good    Frequency Min 3X/week   Barriers to discharge        Co-evaluation PT/OT/SLP Co-Evaluation/Treatment: Yes Reason for Co-Treatment: Complexity of the patient's impairments (multi-system involvement) PT goals addressed during session: Mobility/safety with mobility OT goals addressed during session: ADL's and self-care;Strengthening/ROM       End of Session Equipment Utilized During Treatment: Gait belt;Oxygen Activity Tolerance: Patient limited by fatigue Patient left: in chair;with call bell/phone within reach Nurse Communication: Mobility status         Time: [id number] PT Time Calculation (min) (ACUTE ONLY): 30 min   Charges:   PT Evaluation $PT Eval Moderate Complexity: 1 Procedure PT Treatments $  Gait Training: 8-22 mins   PT G Codes:        Denice Paradise Nov 27, 2015, 11:01 AM  [facility] [phone] (pager)

## 2015-11-18 NOTE — Progress Notes (Signed)
Report called to [place] receiving nurse, Aniceto Boss, RN. Pt to transfer out into room 3E01.

## 2015-11-18 NOTE — Evaluation (Signed)
Occupational Therapy Evaluation Patient Details Name: Brenda Cruz MRN: [id number] DOB: Sep 04, 1966 Today's Date: 11/18/2015    History of Present Illness 50 yo female admitted with PE and L LE DVT Pt recently hospitalized 10/29/15-11/06/15 PMH: CABG 11/02/15, CAD, GERD, Depression, anxiety, DDD, TMG, asthma   Clinical Impression   PT admitted with PE and L LE DVT. Pt currently with functional limitiations due to the deficits listed below (see OT problem list). PTA patient was independent working in [facility] with recent CABG 11/02/15. Pt will benefit from skilled OT to increase their independence and safety with adls and balance to allow discharge home with possible oxygen needs. OT to follow acutely to educate on energy conservation.   Oxygen desaturation on RA 86-89 %  During ADl with 2 L 90-93%     Follow Up Recommendations  No OT follow up    Equipment Recommendations  None recommended by OT    Recommendations for Other Services       Precautions / Restrictions Precautions Precautions: Other (comment) (CABG) Precaution Comments: oxygen monitoring      Mobility Bed Mobility Overal bed mobility: Needs Assistance Bed Mobility: Rolling;Supine to Sit Rolling: Min guard   Supine to sit: Min guard;HOB elevated     General bed mobility comments: Pt reports sleeping in a recliner and requires (A) from family to complete any transfer. Pt with HOB elevated able to complete transfer without pulling/ pushing  Transfers Overall transfer level: Needs assistance   Transfers: Sit to/from Stand Sit to Stand: Min assist         General transfer comment: declined use of heart pillow for transfers but present in the room    Balance                                            ADL Overall ADL's : Needs assistance/impaired   Eating/Feeding Details (indicate cue type and reason): present in room with very little PO intake . pt reports "i just dont want  it" Grooming: Wash/dry hands;Wash/dry face;Oral care;Set up;Standing                   Toilet Transfer: Minimal assistance;BSC;Ambulation   Toileting- Clothing Manipulation and Hygiene: Min guard;Sit to/from stand       Functional mobility during ADLs: Minimal assistance General ADL Comments: pt with decr oxygen saturations on RA 86-89 and with 2L oxygne 90-93. HR at beginning of session 105 and increaed to [place]      Pertinent Vitals/Pain Pain Assessment: Faces Faces Pain Scale: Hurts whole lot Pain Location: R flank pain Pain Descriptors / Indicators: Grimacing Pain Intervention(s): Repositioned;Monitored during session     Hand Dominance Right   Extremity/Trunk Assessment Upper Extremity Assessment Upper Extremity Assessment: Overall WFL for tasks assessed   Lower Extremity Assessment Lower Extremity Assessment: Defer to PT evaluation   Cervical / Trunk Assessment Cervical / Trunk Assessment: Normal   Communication Communication Communication: No difficulties   Cognition Arousal/Alertness: Awake/alert Behavior During Therapy: WFL for tasks assessed/performed Overall Cognitive Status: Within Functional Limits for tasks assessed                     General Comments       Exercises       Shoulder Instructions  Home Living Family/patient expects to be discharged to:: Private residence Living Arrangements: Spouse/significant other Available Help at Discharge: Family;Available 24 hours/day Type of Home: House Home Access: Stairs to enter [employer] of Steps: 3 Entrance Stairs-Rails: Left Home Layout: One level     Bathroom Shower/Tub: Teacher, early years/pre: Standard     Home Equipment: None   Additional Comments: mother is retired and helping, pt has 44 year old son at home and husband taking time off work to help patient       Prior Functioning/Environment Level of  Independence: Independent with assistive device(s)        Comments: recent (A) required due to s/p CABG    OT Diagnosis: Generalized weakness;Acute pain   OT Problem List: Decreased strength;Decreased activity tolerance;Impaired balance (sitting and/or standing);Decreased knowledge of use of DME or AE;Decreased knowledge of precautions;Cardiopulmonary status limiting activity;Pain   OT Treatment/Interventions: Self-care/ADL training;Energy conservation    OT Goals(Current goals can be found in the care plan section) Acute Rehab OT Goals Patient Stated Goal: to take care of 61 yo son OT Goal Formulation: With patient Time For Goal Achievement: 12/02/15 Potential to Achieve Goals: Good  OT Frequency: Min 2X/week   Barriers to D/C:            Co-evaluation PT/OT/SLP Co-Evaluation/Treatment: Yes Reason for Co-Treatment: Complexity of the patient's impairments (multi-system involvement);For patient/therapist safety   OT goals addressed during session: ADL's and self-care;Strengthening/ROM      End of Session Equipment Utilized During Treatment: Gait belt Nurse Communication: Mobility status;Precautions  Activity Tolerance: Patient tolerated treatment well Patient left: in chair;with call bell/phone within reach   Time: [id number] OT Time Calculation (min): 30 min Charges:  OT General Charges $OT Visit: 1 Procedure OT Evaluation $OT Eval High Complexity: 1 Procedure G-Codes:    Parke Poisson B 11/20/15, 10:47 AM  Jeri Modena   OTR/L [phone] Office: [phone] .

## 2015-11-18 NOTE — Progress Notes (Signed)
SATURATION QUALIFICATIONS: (This note is used to comply with regulatory documentation for home oxygen)  Patient Saturations on Room Air at Rest = 88-92%  Patient Saturations on Room Air while Ambulating = 86-89%  Patient Saturations on 2 Liters of oxygen while Ambulating = 90-93%  Please briefly explain why patient needs home oxygen:Pt desat if off O2 for a length of time at rest and with activity. May need home O2.  Did encourage incentive spirometer and pursed lip breathing.  Thanks.  [place] [phone] (pager)

## 2015-11-18 NOTE — Progress Notes (Signed)
Patient requires much encouragement to get OOB.  States, "I don't feel like it".  PT/OT in for assessment and treatment.  Patient able to ambulate approx. 500 ft. Around unit in hallway.  See PT notes.  Must be encouraged to participate in ADLs.  Patient informed of pending transfer to telemetry unit.

## 2015-11-18 NOTE — Progress Notes (Addendum)
[facility]                                                                              Patient Demographics  Brenda Cruz, is a 50 y.o. female, DOB - 1966-05-28, [id number]  Admit date - 11/15/2015   Admitting Physician Ivor Costa, MD  Outpatient Primary MD for the patient is Pcp Not In System  LOS - 3   Chief Complaint  Patient presents with  . Shortness of Breath  . Back Pain  . Post-op Problem      HPI on 11/15/2015 by Dr. Ivor Costa Brenda Cruz is a 50 y.o. female with PMH of CAD, s/p of CABG 11/02/15 by Dr. Roxan Hockey, hyperlipidemia, asthma, GERD, depression, anxiety, DDD, TMG, who presents with right sided flank pain and chest pain, shortness of breath and fever.  Patient was recently hospitalized from 01/12-1/20/17 and had CABG done by Dr. Roxan Hockey on 11/02/15. She has been doing fine utile yesterday, when she started having shortness and right flank pain and R lower chest pain. It is severe, constant, sharp, pleuritic. It is aggravated by coughing or deep breath. Patient has mild cough with small amount dark colored sputum production. She did not have fever at home, but was found to have temperature 101.2 in the emergency room. No chills. Patient reports that she had tenderness over left calf area two days ago, which has gone today. Patient does not have abdominal pain, diarrhea, symptoms of a UTI or unilateral weakness.  In ED, patient was found to have WBC 21.7, lactate 1.83-->3.44, negative troponin, negative pregnancy test, temperature 11.2, tachycardia, tachypnea, acute renal injury. CT-chest showed bilateral lower lobe atelectasis versus infiltrates and small right pleural effusion, but no evidence of mediastinal hematoma or evidence of pericardial effusion. Patient is admitted to inpatient for further eval and treatment. EDP consulted on-call cardiothoracic surgeon.  Assessment & Plan   Acute respiratory insufficiency secondary to possible HCAP vs  PE -Continue supplemental O2 to maintain sats above 92% -Will attempt to wean -Upon admission, patient tachycardic and tachypneic -CT chest without contrast showed bilateral lower lobe atelectasis versus infiltrate and small right pleural effusion -VQ scan obtained as patient has allergy to contrast dye: intermediate probability for PE -LE doppler: +DVT left distal femoral, popliteal, posterior tibial veins -Echocardiogram: EF 123456, grade 1 diastolic dysfunction -Influenza negative -PT and OT consulted and pending -Discontinued IV heparin and transitioned to Xarelto; however, in light of patient's thrombocytopenia, discontinue Xarelto and place on Arixtra.  (see discussion below)  Sepsis possibly secondary to pneumonia -CT chest as above -Treatment plan as above -Upon admission, patient was febrile, with tachycardia and tachypnea and leukocytosis -Leukocytosis resolved -Vancomyin discontinued, continue cefepime -Blood cultures show no growth to date -Strep pneumonia urine Ag negative -Pending legionella urine antigen   Questionable PE/LLE DVT -Treatment plan as above -AntithrombIII 96, Protein C activity 112, Protein C 75, Protein S Activity 66, Protein S Ag 116 -PTT lupus AC 110 (high), DRVVT 82 (high) -Factor 5 and prothrombin pending  Coronary artery disease -Status post CABG on 11/02/2015 -Cardiothoracic surgery made aware of patient's admission -Troponin mildly elevated however flat, continue to trend -Continue aspirin, statin, beta blocker -Patient  refused statin at last discharge  Essential hypertension -Lisinopril held due to acute renal injury -Continue metoprolol and IV hydralazine as needed  Acute kidney injury -Likely secondary to sepsis and dehydration versus medication -Appears to have resolved, currently 0.81 -Continue to monitor BMP  GERD -Continue PPI  Depression and anxiety -Continue Prozac and Ativan  Chronic normocytic anemia -Baseline  hemoglobin approximately 10, currently 8.1 (drop dilutional?) -Continue to monitor CBC -Transfuse if Hb <7 -Anemia panel: Iron 7, Ferritin 281 -Pending FOBT -Given dose of feraheme -Started on oral iron supplementation   Thrombocytopenia -?heparin vs vancomycin vs PPI -HIT panel pending -[place] were >300 10/29/15, and have steadily dropped since -Continue to monitor CBC -Hematology consulted and appreciated.  Dr. Alvy Bimler will see the patient.  Agreed with awaiting HIT panel and starting Arixtra.  Hyperkalemia -Resolved, continue to monitor BMP  Code Status: Full  Family Communication: None at bedside  Disposition Plan: Admitted. Transfer to medical floor.  Pending HIT panel and futher recommendations from hematology.  Time Spent in minutes   30 minutes  Procedures  VQ Scan LE doppler Echocardiogram  Consults   Cardiothoracic surgery - made aware of patient's admission  DVT Prophylaxis Xarelto  Lab Results  Component Value Date   PLT 125* 11/18/2015    Medications  Scheduled Meds: . aspirin EC  325 mg Oral Daily  . ceFEPime (MAXIPIME) IV  1 g Intravenous 3 times per day  . dextromethorphan-guaiFENesin  1 tablet Oral BID  . diphenhydrAMINE  25 mg Intravenous Once  . [START ON 11/19/2015] ferrous sulfate  325 mg Oral BID WC  . ferumoxytol  510 mg Intravenous Once  . FLUoxetine  20 mg Oral Daily  . levalbuterol  1.25 mg Nebulization QID  . metoprolol  25 mg Oral BID  . pantoprazole  40 mg Oral Daily  . Rivaroxaban  15 mg Oral BID WC   Continuous Infusions:   PRN Meds:.acetaminophen, hydrALAZINE, HYDROmorphone (DILAUDID) injection, LORazepam, oxyCODONE, simethicone, technetium TC 74M diethylenetriame-pentaacetic acid  Antibiotics    Anti-infectives    Start     Dose/Rate Route Frequency Ordered Stop   11/15/15 2000  ceFEPIme (MAXIPIME) 1 g in dextrose 5 % 50 mL IVPB     1 g 100 mL/hr over 30 Minutes Intravenous 3 times per day 11/15/15 1958     11/15/15  2000  vancomycin (VANCOCIN) 1,250 mg in sodium chloride 0.9 % 250 mL IVPB     1,250 mg 166.7 mL/hr over 90 Minutes Intravenous Every 12 hours 11/15/15 1958 11/17/15 2129      Subjective:   Brenda Cruz seen and examined today.  She continues to complain of pain on the right side. She was able to walk for a bit last night.  She feels some shortness of breath, but has improved.  Denies chest pain, abdominal pain, nausea and vomiting. Worries about needing O2 at home.  Objective:   Filed Vitals:   11/18/15 0335 11/18/15 0400 11/18/15 0500 11/18/15 0600  BP:  139/61 121/64 130/75  Pulse:  100 98 97  Temp: 99 F (37.2 C)     TempSrc: Oral     Resp:  27 15 12   Height:      Weight:      SpO2:  100% 99% 100%    Wt Readings from Last 3 Encounters:  11/17/15 100.562 kg (221 lb 11.2 oz)  11/06/15 103.1 kg (227 lb 4.7 oz)  10/16/15 103.602 kg (228 lb 6.4 oz)     Intake/Output  Summary (Last 24 hours) at 11/18/15 0703 Last data filed at 11/18/15 0600  Gross per 24 hour  Intake   1967 ml  Output   1000 ml  Net    967 ml    Exam  General: Well developed, well nourished, NAD  HEENT: NCAT,  mucous membranes moist.   Cardiovascular: S1 S2 auscultated, RRR  Respiratory: Slightly diminished, however Clear   Abdomen: Soft, obese, nontender, nondistended, + bowel sounds  Extremities: warm dry without cyanosis clubbing or edema, LLE larger than RLE  Neuro: AAOx3, nonfocal  Psych: Appropriate mood and affect, pleasant  Data Review   Micro Results Recent Results (from the past 240 hour(s))  Blood culture (routine x 2)     Status: None (Preliminary result)   Collection Time: 11/15/15  8:35 PM  Result Value Ref Range Status   Specimen Description BLOOD LEFT FOREARM  Final   Special Requests BOTTLES DRAWN AEROBIC AND ANAEROBIC 5CC   Final   Culture NO GROWTH 2 DAYS  Final   Report Status PENDING  Incomplete    Radiology Reports Dg Chest 2 View  11/15/2015  CLINICAL DATA:   Right-sided chest tightness and shortness of breath EXAM: CHEST  2 VIEW COMPARISON:  11/04/2015 FINDINGS: Streaky and linear opacity in the lung bases is probably related atelectasis although pneumonia medial lung bases cannot be entirely excluded. Opacity seen posteriorly in the costophrenic sulcus on the lateral film. Lung volumes are low. Patient is status post median sternotomy. The visualized bony structures of the thorax are intact. IMPRESSION: Interval decrease in volume with interval development of bibasilar collapse/ consolidation. Electronically Signed   By: Misty Stanley M.D.   On: 11/15/2015 17:50   Dg Chest 2 View  10/29/2015  CLINICAL DATA:  Mid chest pressure off and on for 3 weeks with exertion, hypertension, asthma, smoker EXAM: CHEST  2 VIEW COMPARISON:  03/20/2014 ; correlation CT chest 10/13/2011 FINDINGS: Normal heart size, mediastinal contours, and pulmonary vascularity. Minimal central peribronchial thickening. Focus of scarring in lingula adjacent to LEFT heart border similar to prior CT. No acute infiltrate, pleural effusion or pneumothorax. Bones unremarkable. IMPRESSION: Bronchitic changes with lingular scarring. No acute abnormalities. Electronically Signed   By: Lavonia Dana M.D.   On: 10/29/2015 10:27   Ct Chest Wo Contrast  11/15/2015  CLINICAL DATA:  Shortness of breath. Right-sided back pain. Pneumonia. One week postop from CABG. EXAM: CT CHEST WITHOUT CONTRAST TECHNIQUE: Multidetector CT imaging of the chest was performed following the standard protocol without IV contrast. COMPARISON:  10/13/2011 FINDINGS: Mediastinum/Lymph Nodes: No masses or pathologically enlarged lymph nodes identified on this un-enhanced exam. No evidence of mediastinal hematoma. Recent postop changes from CABG noted. No evidence of pericardial effusion. Lungs/Pleura: Bilateral lower lobe atelectasis or infiltrates are seen. Small right pleural effusion noted. No evidence of pneumothorax. Upper abdomen:  No acute findings. Musculoskeletal: No chest wall mass or suspicious bone lesions identified. IMPRESSION: Bilateral lower lobe atelectasis versus infiltrates and small right pleural effusion. Electronically Signed   By: Earle Gell M.D.   On: 11/15/2015 20:31   Nm Pulmonary Perf And Vent  11/16/2015  CLINICAL DATA:  Shortness of breath.  Pleuritic chest pain. EXAM: NUCLEAR MEDICINE VENTILATION - PERFUSION LUNG SCAN TECHNIQUE: Ventilation images were obtained in multiple projections using inhaled aerosol Tc-24m DTPA. Perfusion images were obtained in multiple projections after intravenous injection of Tc-70m MAA. RADIOPHARMACEUTICALS:  31 millicurie [id number] DTPA aerosol inhalation and 4.1 millicurie [id number] MAA IV COMPARISON:  Chest x-ray  and CT from earlier today FINDINGS: There is a moderate sized area of left upper lobe posterior segment matched defect. There is a large peripheral area of right lower lobe basilar segment perfusion defect which is triple matched (see RPO). IMPRESSION: Intermediate probability category (20-79%) by PIOPED II. Electronically Signed   By: Monte Fantasia M.D.   On: 11/16/2015 00:41   Dg Chest Port 1 View  11/04/2015  CLINICAL DATA:  CABG EXAM: PORTABLE CHEST 1 VIEW COMPARISON:  11/03/2015 FINDINGS: Swan-Ganz catheter removed. Left chest tube removed. Mediastinal drain removed. Central venous catheters have been removed. Negative for pneumothorax. Negative for heart failure or effusion. Lungs are clear. IMPRESSION: Support lines have been removed.  No acute cardiopulmonary disease. Electronically Signed   By: Franchot Gallo M.D.   On: 11/04/2015 08:08   Dg Chest Port 1 View  11/03/2015  CLINICAL DATA:  Patient status post CABG procedure. EXAM: PORTABLE CHEST 1 VIEW COMPARISON:  Chest radiograph 11/02/2015. FINDINGS: PA catheter tip projects over the expected location the proximal right main pulmonary artery. Bilateral chest tubes in place. Interval extubation and  removal of enteric tube. Multiple monitoring leads course over the patient. Stable cardiac and mediastinal contours status post median sternotomy. Focal opacity left lung base, unchanged from prior. Interval improvement in previously described perihilar interstitial opacities. No large pleural effusion or pneumothorax. IMPRESSION: Interval extubation. Probable focal atelectasis left lung base. Interval retraction pulmonary arterial catheter with tip projecting over the expected location main right pulmonary artery. Electronically Signed   By: Lovey Newcomer M.D.   On: 11/03/2015 08:14   Dg Chest Port 1 View  11/02/2015  CLINICAL DATA:  Status post CABG today.  Initial encounter. EXAM: PORTABLE CHEST 1 VIEW COMPARISON:  PA and lateral chest 10/29/2015. FINDINGS: Endotracheal tube is in place with the tip in good position at the level of the clavicular heads. Right IJ approach Swan-Ganz catheter is also in place with the tip in the the distal descending interlobar pulmonary artery on the right. Recommend withdrawal approximately 7.5 cm. Bilateral chest tubes are noted. No pneumothorax. Mild basilar atelectasis is seen. Six intact median sternotomy wires are identified. IMPRESSION: Right IJ approach Swan-Ganz catheter tip is in the distal descending interlobar pulmonary artery on the right. Recommend withdrawal of 7.5 cm. Support apparatus otherwise projects in good position. No pneumothorax. Bibasilar atelectasis. These results were called by telephone at the time of interpretation on 11/02/2015 at 12:24 pm to the patient's nurse Precious Bard, who verbally acknowledged these results. Electronically Signed   By: Inge Rise M.D.   On: 11/02/2015 12:28    CBC  Recent Labs Lab 11/15/15 1836 11/16/15 0918 11/17/15 0300 11/18/15 0330  WBC 21.7* 18.2* 15.7* 9.4  HGB 10.8* 9.3* 8.0* 8.1*  HCT 34.0* 28.5* 25.7* 25.0*  PLT 208 151 136* 125*  MCV 90.4 91.6 91.1 91.2  MCH 28.7 29.9 28.4 29.6  MCHC 31.8 32.6 31.1  32.4  RDW 13.1 13.5 13.4 13.5  LYMPHSABS 1.7  --   --   --   MONOABS 2.1*  --   --   --   EOSABS 0.1  --   --   --   BASOSABS 0.0  --   --   --     Chemistries   Recent Labs Lab 11/15/15 1836 11/16/15 0918 11/17/15 0300 11/18/15 0330  NA 135 137 134* 136  K 5.0 5.2* 4.4 4.5  CL 97* 106 104 103  CO2 22 17* 22 23  GLUCOSE 174*  129* 129* 164*  BUN 12 13 9 8   CREATININE 1.13* 0.80 0.79 0.81  CALCIUM 9.7 8.4* 8.2* 8.6*  AST 24  --   --   --   ALT 17  --   --   --   ALKPHOS 75  --   --   --   BILITOT 0.9  --   --   --    ------------------------------------------------------------------------------------------------------------------ estimated creatinine clearance is 106.5 mL/min (by C-G formula based on Cr of 0.81). ------------------------------------------------------------------------------------------------------------------ No results for input(s): HGBA1C in the last 72 hours. ------------------------------------------------------------------------------------------------------------------ No results for input(s): CHOL, HDL, LDLCALC, TRIG, CHOLHDL, LDLDIRECT in the last 72 hours. ------------------------------------------------------------------------------------------------------------------ No results for input(s): TSH, T4TOTAL, T3FREE, THYROIDAB in the last 72 hours.  Invalid input(s): FREET3 ------------------------------------------------------------------------------------------------------------------  Recent Labs  11/17/15 1140  VITAMINB12 817  FOLATE 11.7  FERRITIN 281  TIBC 253  IRON 7*  RETICCTPCT 1.8    Coagulation profile  Recent Labs Lab 11/16/15 0114  INR 1.36    No results for input(s): DDIMER in the last 72 hours.  Cardiac Enzymes  Recent Labs Lab 11/16/15 0114 11/16/15 0506 11/16/15 0918  TROPONINI 0.05* 0.05* 0.04*    ------------------------------------------------------------------------------------------------------------------ Invalid input(s): POCBNP    Brenda Cruz D.O. on 11/18/2015 at 7:03 AM  Between 7am to 7pm - Pager - [phone]  After 7pm go to [email or web address] - password [email or web address]  And look for the night coverage person covering for me after hours  [facility] Office  [phone]

## 2015-11-18 NOTE — Care Management Note (Signed)
Case Management Note  Patient Details  Name: DAMIYAH BYRDSONG MRN: [id number] Date of Birth: 01/04/1966  Subjective/Objective:     Provided pt with card for 30-day free trial of Xarelto and 0 copay card.                       Expected Discharge Plan:  Home/Self Care  Discharge planning Services  CM Consult  Status of Service:  In process, will continue to follow  Girard Cooter, RN 11/18/2015, 1:08 PM

## 2015-11-18 NOTE — Care Management Note (Signed)
Case Management Note  Patient Details  Name: JANNATUL DROZE MRN: [id number] Date of Birth: 1966-08-02  Subjective/Objective:       Insurance coverage for Xarelto:  Per rep at [employer]:   Xarelto: $35 at [employer], no auth required                          Expected Discharge Plan:  Home/Self Care  Discharge planning Services  CM Consult  Status of Service:  In process, will continue to follow  Girard Cooter, RN 11/18/2015, 7:21 AM

## 2015-11-19 DIAGNOSIS — I2510 Atherosclerotic heart disease of native coronary artery without angina pectoris: Secondary | ICD-10-CM

## 2015-11-19 DIAGNOSIS — F411 Generalized anxiety disorder: Secondary | ICD-10-CM

## 2015-11-19 DIAGNOSIS — N179 Acute kidney failure, unspecified: Secondary | ICD-10-CM

## 2015-11-19 DIAGNOSIS — K219 Gastro-esophageal reflux disease without esophagitis: Secondary | ICD-10-CM

## 2015-11-19 DIAGNOSIS — F329 Major depressive disorder, single episode, unspecified: Secondary | ICD-10-CM

## 2015-11-19 DIAGNOSIS — R509 Fever, unspecified: Secondary | ICD-10-CM

## 2015-11-19 DIAGNOSIS — I10 Essential (primary) hypertension: Secondary | ICD-10-CM

## 2015-11-19 LAB — BASIC METABOLIC PANEL
ANION GAP: 10 (ref 5–15)
BUN: 7 mg/dL (ref 6–20)
CALCIUM: 8.3 mg/dL — AB (ref 8.9–10.3)
CO2: 25 mmol/L (ref 22–32)
Chloride: 102 mmol/L (ref 101–111)
Creatinine, Ser: 0.72 mg/dL (ref 0.44–1.00)
Glucose, Bld: 141 mg/dL — ABNORMAL HIGH (ref 65–99)
Potassium: 3.9 mmol/L (ref 3.5–5.1)
SODIUM: 137 mmol/L (ref 135–145)

## 2015-11-19 LAB — HEPARIN INDUCED PLATELET AB (HIT ANTIBODY): HEPARIN INDUCED PLT AB: 2.679 {OD_unit} — AB (ref 0.000–0.400)

## 2015-11-19 LAB — CBC
HEMATOCRIT: 24.2 % — AB (ref 36.0–46.0)
Hemoglobin: 7.3 g/dL — ABNORMAL LOW (ref 12.0–15.0)
MCH: 26.7 pg (ref 26.0–34.0)
MCHC: 30.2 g/dL (ref 30.0–36.0)
MCV: 88.6 fL (ref 78.0–100.0)
PLATELETS: 150 10*3/uL (ref 150–400)
RBC: 2.73 MIL/uL — ABNORMAL LOW (ref 3.87–5.11)
RDW: 13.6 % (ref 11.5–15.5)
WBC: 7.4 10*3/uL (ref 4.0–10.5)

## 2015-11-19 LAB — PREPARE RBC (CROSSMATCH)

## 2015-11-19 MED ORDER — LEVOFLOXACIN 750 MG PO TABS
750.0000 mg | ORAL_TABLET | Freq: Every day | ORAL | Status: DC
  Administered 2015-11-19 – 2015-11-20 (×2): 750 mg via ORAL
  Filled 2015-11-19 (×2): qty 1

## 2015-11-19 MED ORDER — SODIUM CHLORIDE 0.9 % IV SOLN
Freq: Once | INTRAVENOUS | Status: AC
  Administered 2015-11-19: 08:00:00 via INTRAVENOUS

## 2015-11-19 NOTE — Progress Notes (Signed)
[facility]                                                                              Patient Demographics  Brenda Cruz, is a 50 y.o. female, DOB - 1966-02-11, [id number]  Admit date - 11/15/2015   Admitting Physician Ivor Costa, MD  Outpatient Primary MD for the patient is Pcp Not In System  LOS - 4   Chief Complaint  Patient presents with  . Shortness of Breath  . Back Pain  . Post-op Problem       Brief HPI   HPI on 11/15/2015 by Dr. Ivor Costa Brenda Cruz is a 50 y.o. female with PMH of CAD, s/p of CABG 11/02/15 by Dr. Roxan Hockey, hyperlipidemia, asthma, GERD, depression, anxiety, DDD, TMG, who presented with right sided flank pain and chest pain, shortness of breath and fever. Patient was recently hospitalized from 01/12-1/20/17 and had CABG done by Dr. Roxan Hockey on 11/02/15. Patient was doing fine until the day before the admission, when she started having shortness and right flank pain and R lower chest pain, severe, constant, sharp, pleuritic. Patient also reported mild cough with small amount dark colored sputum production. Denied any fevers at home but was found to have temp of 101.2 in the emergency room. No chills. Patient reported that she had tenderness over left calf area two days ago prior to admission, per her report had resolved. In ED, patient was found to have WBC 21.7, lactate 1.83-->3.44, negative troponin, negative pregnancy test, temperature 11.2, tachycardia, tachypnea, acute renal injury. CT-chest showed bilateral lower lobe atelectasis versus infiltrates and small right pleural effusion, but no evidence of mediastinal hematoma or evidence of pericardial effusion. Patient is admitted to inpatient for further eval and treatment. EDP consulted on-call cardiothoracic surgeon.   Assessment & Plan   Acute respiratory insufficiency secondary to possible HCAP vs PE: Presenting with pleuritic chest pain, tachycardia and  tachypnea. -Improving, O2 sats 98% on 2 L, home O2 evaluation -> will need 2 L O2 continuous -CT chest without contrast showed bilateral lower lobe atelectasis versus infiltrate and small right pleural effusion -VQ scan obtained as patient has allergy to contrast dye: intermediate probability for PE -LE doppler: +DVT left distal femoral, popliteal, posterior tibial veins -Echocardiogram: EF [id number], grade 1 diastolic dysfunction -Influenza negative -PT and OT recommended home health PT -Discontinued IV heparin and transitioned to Xarelto; however, in light of patient's thrombocytopenia, discontinued Xarelto and place on Arixtra. (see discussion below)  Sepsis possibly secondary to pneumonia - Discussed in detail with Dr Alvy Bimler, possibility of bone marrow suppression with vancomycin and cefepime as patient's hemoglobin is trending down. No obvious bleeding. - Discontinued IV vancomycin and cefepime - Placed on oral levofloxacin - Blood cultures show no growth to date -Strep pneumonia urine Ag negative, urine Legionella antigen negative   Left lower extremity DVT +/- bilateral pulmonary embolism  -AntithrombIII 96, Protein C activity 112, Protein C 75, Protein S Activity 66, Protein S Ag 116 -PTT lupus AC 110 (high), DRVVT 82 (high) -Factor 5 and prothrombin pending - Plan to continue treatment with Arixtra, pending workup to exclude possibility of HIT.  Dr   Coronary artery disease -Status post CABG on 11/02/2015 -Cardiothoracic surgery made aware of patient's admission -Troponin mildly elevated however flat, continue to trend -Continue aspirin, statin, beta blocker -Patient refused statin at last discharge  Essential hypertension -Lisinopril held due to acute renal injury -Continue metoprolol and IV hydralazine as needed  Acute kidney injury- resolved -Likely secondary to sepsis and dehydration versus medication -Continue to monitor BMP  GERD -Continue PPI  Depression and  anxiety -Continue Prozac and Ativan  Chronic normocytic anemia -Baseline hemoglobin approximately 10 -Anemia panel: Iron 7, Ferritin 281 -Pending FOBT -Given dose of feraheme -Started on oral iron supplementation  - Hemoglobin 7.3 today, Transfuse 2 units packed RBCs  Thrombocytopenia -?heparin vs vancomycin vs PPI -HIT panel pending -[place] were >300 10/29/15, and have steadily dropped, now PLT improving today -Continue to monitor CBC -Hematology following, discussed with Dr. Alvy Bimler, awaiting HIT panel, cont Arixtra.  Hyperkalemia -Resolved, continue to monitor BMP  Code Status: FC   Family Communication: Discussed in detail with the patient, all imaging results, lab results explained to the patient    Disposition Plan:   Time Spent in minutes 25 minutes  Procedures  VQ Scan LE doppler Echocardiogram  Consults   Hematology  DVT Prophylaxis  arixtra  Medications  Scheduled Meds: . sodium chloride   Intravenous Once  . aspirin EC  81 mg Oral Daily  . dextromethorphan-guaiFENesin  1 tablet Oral BID  . diphenhydrAMINE  25 mg Intravenous Once  . docusate sodium  100 mg Oral BID  . ferrous sulfate  325 mg Oral BID WC  . FLUoxetine  20 mg Oral Daily  . fondaparinux (ARIXTRA) injection  10 mg Subcutaneous Q0600  . hydrocortisone   Rectal BID  . levalbuterol  1.25 mg Nebulization QID  . levofloxacin  750 mg Oral Daily  . metoprolol  25 mg Oral BID  . pantoprazole  40 mg Oral Daily  . pravastatin  40 mg Oral q1800   Continuous Infusions:  PRN Meds:.acetaminophen, hydrALAZINE, HYDROmorphone (DILAUDID) injection, LORazepam, oxyCODONE, simethicone, technetium TC 79M diethylenetriame-pentaacetic acid   Antibiotics   Anti-infectives    Start     Dose/Rate Route Frequency Ordered Stop   11/19/15 1000  levofloxacin (LEVAQUIN) tablet 750 mg     750 mg Oral Daily 11/19/15 0830     11/15/15 2000  ceFEPIme (MAXIPIME) 1 g in dextrose 5 % 50 mL IVPB  Status:   Discontinued     1 g 100 mL/hr over 30 Minutes Intravenous 3 times per day 11/15/15 1958 11/19/15 0812   11/15/15 2000  vancomycin (VANCOCIN) 1,250 mg in sodium chloride 0.9 % 250 mL IVPB     1,250 mg 166.7 mL/hr over 90 Minutes Intravenous Every 12 hours 11/15/15 1958 11/17/15 2129        Subjective:   Brenda Cruz was seen and examined today. Continues to complain on the right side, no chest pain at this time. Shortness of breath is improving. No fevers or chills. Patient denies dizziness, abdominal pain, N/V/D/C, new weakness, numbess, tingling. No acute events overnight.    Objective:   Blood pressure 138/80, pulse 99, temperature 97.5 F (36.4 C), temperature source Oral, resp. rate 18, height 5\' 8"  (1.727 m), weight 98.884 kg (218 lb), last menstrual period 11/01/2015, SpO2 98 %.  Wt Readings from Last 3 Encounters:  11/19/15 98.884 kg (218 lb)  11/06/15 103.1 kg (227 lb 4.7 oz)  10/16/15 103.602 kg (228 lb 6.4 oz)  Intake/Output Summary (Last 24 hours) at 11/19/15 1144 Last data filed at 11/19/15 0600  Gross per 24 hour  Intake    870 ml  Output    950 ml  Net    -80 ml    Exam  General: Alert and oriented x 3, NAD  HEENT:  PERRLA, EOMI, Anicteric Sclera, mucous membranes moist.   Neck: Supple, no JVD, no masses  CVS: S1 S2 auscultated, no rubs, murmurs or gallops. Regular rate and rhythm.  Respiratory: Clear to auscultation bilaterally, no wheezing, rales or rhonchi  Abdomen: Soft, nontender, nondistended, + bowel sounds  Ext: no cyanosis clubbing or edema  Neuro: AAOx3, Cr N's II- XII. Strength 5/5 upper and lower extremities bilaterally  Skin: No rashes  Psych: Normal affect and demeanor, alert and oriented x3    Data Review   Micro Results Recent Results (from the past 240 hour(s))  Blood culture (routine x 2)     Status: None (Preliminary result)   Collection Time: 11/15/15  8:35 PM  Result Value Ref Range Status   Specimen Description  BLOOD LEFT FOREARM  Final   Special Requests BOTTLES DRAWN AEROBIC AND ANAEROBIC 5CC   Final   Culture NO GROWTH 3 DAYS  Final   Report Status PENDING  Incomplete  Blood culture (routine x 2)     Status: None (Preliminary result)   Collection Time: 11/15/15  9:46 PM  Result Value Ref Range Status   Specimen Description BLOOD LEFT HAND  Final   Special Requests BOTTLES DRAWN AEROBIC AND ANAEROBIC 5CC  Final   Culture NO GROWTH 3 DAYS  Final   Report Status PENDING  Incomplete  Culture, Urine     Status: None   Collection Time: 11/16/15 10:30 PM  Result Value Ref Range Status   Specimen Description URINE, CLEAN CATCH  Final   Special Requests NONE  Final   Culture 1,000 COLONIES/mL INSIGNIFICANT GROWTH  Final   Report Status 11/18/2015 FINAL  Final    Radiology Reports Dg Chest 2 View  11/15/2015  CLINICAL DATA:  Right-sided chest tightness and shortness of breath EXAM: CHEST  2 VIEW COMPARISON:  11/04/2015 FINDINGS: Streaky and linear opacity in the lung bases is probably related atelectasis although pneumonia medial lung bases cannot be entirely excluded. Opacity seen posteriorly in the costophrenic sulcus on the lateral film. Lung volumes are low. Patient is status post median sternotomy. The visualized bony structures of the thorax are intact. IMPRESSION: Interval decrease in volume with interval development of bibasilar collapse/ consolidation. Electronically Signed   By: Misty Stanley M.D.   On: 11/15/2015 17:50   Dg Chest 2 View  10/29/2015  CLINICAL DATA:  Mid chest pressure off and on for 3 weeks with exertion, hypertension, asthma, smoker EXAM: CHEST  2 VIEW COMPARISON:  03/20/2014 ; correlation CT chest 10/13/2011 FINDINGS: Normal heart size, mediastinal contours, and pulmonary vascularity. Minimal central peribronchial thickening. Focus of scarring in lingula adjacent to LEFT heart border similar to prior CT. No acute infiltrate, pleural effusion or pneumothorax. Bones  unremarkable. IMPRESSION: Bronchitic changes with lingular scarring. No acute abnormalities. Electronically Signed   By: Lavonia Dana M.D.   On: 10/29/2015 10:27   Ct Chest Wo Contrast  11/15/2015  CLINICAL DATA:  Shortness of breath. Right-sided back pain. Pneumonia. One week postop from CABG. EXAM: CT CHEST WITHOUT CONTRAST TECHNIQUE: Multidetector CT imaging of the chest was performed following the standard protocol without IV contrast. COMPARISON:  10/13/2011 FINDINGS: Mediastinum/Lymph Nodes: No  masses or pathologically enlarged lymph nodes identified on this un-enhanced exam. No evidence of mediastinal hematoma. Recent postop changes from CABG noted. No evidence of pericardial effusion. Lungs/Pleura: Bilateral lower lobe atelectasis or infiltrates are seen. Small right pleural effusion noted. No evidence of pneumothorax. Upper abdomen: No acute findings. Musculoskeletal: No chest wall mass or suspicious bone lesions identified. IMPRESSION: Bilateral lower lobe atelectasis versus infiltrates and small right pleural effusion. Electronically Signed   By: Earle Gell M.D.   On: 11/15/2015 20:31   Nm Pulmonary Perf And Vent  11/16/2015  CLINICAL DATA:  Shortness of breath.  Pleuritic chest pain. EXAM: NUCLEAR MEDICINE VENTILATION - PERFUSION LUNG SCAN TECHNIQUE: Ventilation images were obtained in multiple projections using inhaled aerosol Tc-57m DTPA. Perfusion images were obtained in multiple projections after intravenous injection of Tc-50m MAA. RADIOPHARMACEUTICALS:  31 millicurie [id number] DTPA aerosol inhalation and 4.1 millicurie [id number] MAA IV COMPARISON:  Chest x-ray and CT from earlier today FINDINGS: There is a moderate sized area of left upper lobe posterior segment matched defect. There is a large peripheral area of right lower lobe basilar segment perfusion defect which is triple matched (see RPO). IMPRESSION: Intermediate probability category (20-79%) by PIOPED II. Electronically  Signed   By: Monte Fantasia M.D.   On: 11/16/2015 00:41   Dg Chest Port 1 View  11/04/2015  CLINICAL DATA:  CABG EXAM: PORTABLE CHEST 1 VIEW COMPARISON:  11/03/2015 FINDINGS: Swan-Ganz catheter removed. Left chest tube removed. Mediastinal drain removed. Central venous catheters have been removed. Negative for pneumothorax. Negative for heart failure or effusion. Lungs are clear. IMPRESSION: Support lines have been removed.  No acute cardiopulmonary disease. Electronically Signed   By: Franchot Gallo M.D.   On: 11/04/2015 08:08   Dg Chest Port 1 View  11/03/2015  CLINICAL DATA:  Patient status post CABG procedure. EXAM: PORTABLE CHEST 1 VIEW COMPARISON:  Chest radiograph 11/02/2015. FINDINGS: PA catheter tip projects over the expected location the proximal right main pulmonary artery. Bilateral chest tubes in place. Interval extubation and removal of enteric tube. Multiple monitoring leads course over the patient. Stable cardiac and mediastinal contours status post median sternotomy. Focal opacity left lung base, unchanged from prior. Interval improvement in previously described perihilar interstitial opacities. No large pleural effusion or pneumothorax. IMPRESSION: Interval extubation. Probable focal atelectasis left lung base. Interval retraction pulmonary arterial catheter with tip projecting over the expected location main right pulmonary artery. Electronically Signed   By: Lovey Newcomer M.D.   On: 11/03/2015 08:14   Dg Chest Port 1 View  11/02/2015  CLINICAL DATA:  Status post CABG today.  Initial encounter. EXAM: PORTABLE CHEST 1 VIEW COMPARISON:  PA and lateral chest 10/29/2015. FINDINGS: Endotracheal tube is in place with the tip in good position at the level of the clavicular heads. Right IJ approach Swan-Ganz catheter is also in place with the tip in the the distal descending interlobar pulmonary artery on the right. Recommend withdrawal approximately 7.5 cm. Bilateral chest tubes are noted. No  pneumothorax. Mild basilar atelectasis is seen. Six intact median sternotomy wires are identified. IMPRESSION: Right IJ approach Swan-Ganz catheter tip is in the distal descending interlobar pulmonary artery on the right. Recommend withdrawal of 7.5 cm. Support apparatus otherwise projects in good position. No pneumothorax. Bibasilar atelectasis. These results were called by telephone at the time of interpretation on 11/02/2015 at 12:24 pm to the patient's nurse Precious Bard, who verbally acknowledged these results. Electronically Signed   By: Inge Rise M.D.   On:  11/02/2015 12:28    CBC  Recent Labs Lab 11/15/15 1836 11/16/15 0918 11/17/15 0300 11/18/15 0330 11/19/15 0300  WBC 21.7* 18.2* 15.7* 9.4 7.4  HGB 10.8* 9.3* 8.0* 8.1* 7.3*  HCT 34.0* 28.5* 25.7* 25.0* 24.2*  PLT 208 151 136* 125* 150  MCV 90.4 91.6 91.1 91.2 88.6  MCH 28.7 29.9 28.4 29.6 26.7  MCHC 31.8 32.6 31.1 32.4 30.2  RDW 13.1 13.5 13.4 13.5 13.6  LYMPHSABS 1.7  --   --   --   --   MONOABS 2.1*  --   --   --   --   EOSABS 0.1  --   --   --   --   BASOSABS 0.0  --   --   --   --     Chemistries   Recent Labs Lab 11/15/15 1836 11/16/15 0918 11/17/15 0300 11/18/15 0330 11/19/15 0300  NA 135 137 134* 136 137  K 5.0 5.2* 4.4 4.5 3.9  CL 97* 106 104 103 102  CO2 22 17* 22 23 25   GLUCOSE 174* 129* 129* 164* 141*  BUN 12 13 9 8 7   CREATININE 1.13* 0.80 0.79 0.81 0.72  CALCIUM 9.7 8.4* 8.2* 8.6* 8.3*  AST 24  --   --   --   --   ALT 17  --   --   --   --   ALKPHOS 75  --   --   --   --   BILITOT 0.9  --   --   --   --    ------------------------------------------------------------------------------------------------------------------ estimated creatinine clearance is 106.9 mL/min (by C-G formula based on Cr of 0.72). ------------------------------------------------------------------------------------------------------------------ No results for input(s): HGBA1C in the last 72  hours. ------------------------------------------------------------------------------------------------------------------ No results for input(s): CHOL, HDL, LDLCALC, TRIG, CHOLHDL, LDLDIRECT in the last 72 hours. ------------------------------------------------------------------------------------------------------------------ No results for input(s): TSH, T4TOTAL, T3FREE, THYROIDAB in the last 72 hours.  Invalid input(s): FREET3 ------------------------------------------------------------------------------------------------------------------  Recent Labs  11/17/15 1140  VITAMINB12 817  FOLATE 11.7  FERRITIN 281  TIBC 253  IRON 7*  RETICCTPCT 1.8    Coagulation profile  Recent Labs Lab 11/16/15 0114  INR 1.36    No results for input(s): DDIMER in the last 72 hours.  Cardiac Enzymes  Recent Labs Lab 11/16/15 0114 11/16/15 0506 11/16/15 0918  TROPONINI 0.05* 0.05* 0.04*   ------------------------------------------------------------------------------------------------------------------ Invalid input(s): POCBNP  No results for input(s): GLUCAP in the last 72 hours.   Brenda Cruz M.D. [facility] 11/19/2015, 11:44 AM  Pager: [phone] Between 7am to 7pm - call Pager - [phone]  After 7pm go to [email or web address] - password [email or web address]  Call night coverage person covering after 7pm

## 2015-11-19 NOTE — Care Management Note (Signed)
Case Management Note  Patient Details  Name: Brenda Cruz MRN: [id number] Date of Birth: Dec 29, 1965  Action/Plan: Patient was independent prior to admission, works full time, lives at home with spouse. Physical therapy recommends HHPT but patient stated that she is short of breath and cannot walk a lot at this time. CM will monitor for the need of home oxygen. Has private insurance with [employer] with prescription drug coverage; pharmacy of choice is [facility] in [place].   Expected Discharge Date:     Possibly 11/22/2015             Expected Discharge Plan:  Home/Self Care Discharge planning Services  CM Consult  Status of Service:  In process, will continue to follow  Sherrilyn Rist [id number] 11/19/2015, 10:23 AM

## 2015-11-19 NOTE — Progress Notes (Signed)
Brenda Cruz   DOB:1965-10-30   [id number]    Subjective: She complained of fatigue and constipation for 5 days. She had mild abdominal cramps. Her right-sided pleuritic chest pain is slowly improving. The patient denies any recent signs or symptoms of bleeding such as spontaneous epistaxis, hematuria or hematochezia.   Objective:  Filed Vitals:   11/19/15 0801 11/19/15 0907  BP: 138/80   Pulse: 99 99  Temp: 97.5 F (36.4 C)   Resp: 18 18     Intake/Output Summary (Last 24 hours) at 11/19/15 1136 Last data filed at 11/19/15 0600  Gross per 24 hour  Intake    870 ml  Output    950 ml  Net    -80 ml    GENERAL:alert, no distress and comfortable. She is obese SKIN: skin color is pale, texture, turgor are normal, no rashes or significant lesions. Noted mild bruising EYES: normal, Conjunctiva arepale and non-injected, sclera clear OROPHARYNX:no exudate, no erythema and lips, buccal mucosa, and tongue normal  NECK: supple, thyroid normal size, non-tender, without nodularity LYMPH:  no palpable lymphadenopathy in the cervical, axillary or inguinal LUNGS: clear to auscultation and percussion with normal breathing effort HEART: regular rate & rhythm and no murmurs and no lower extremity edema ABDOMEN:abdomen soft, non-tender and normal bowel sounds Musculoskeletal:no cyanosis of digits and no clubbing  NEURO: alert & oriented x 3 with fluent speech, no focal motor/sensory deficits   Labs:  Lab Results  Component Value Date   WBC 7.4 11/19/2015   HGB 7.3* 11/19/2015   HCT 24.2* 11/19/2015   MCV 88.6 11/19/2015   PLT 150 11/19/2015   NEUTROABS 17.8* 11/15/2015    Lab Results  Component Value Date   NA 137 11/19/2015   K 3.9 11/19/2015   CL 102 11/19/2015   CO2 25 11/19/2015   Assessment & Plan:  1) Left lower extremity DVT +/- bilateral PE I reviewed with the patient about the plan for care for provoked DVT. It is still unclear whether she had bilateral PE but due  to tachycardia, chest pain and abnormal VQ scan, she is being treated for presumed bilateral PE as well.  Her hypercoagulation panel is non-diagnostic (in the setting of IV heparin). Plan to continue her treatment with Arixtra for now pending work-up to exclude possibility of HIT  2) Iron deficiency anemia and anemia of chronic disease She had received iV iron and would benefit from oral iron replacement therapy. She has worsening anemia but based on recent lack of reticulocytosis, I think were looking at anemia of chronic disease, possibly with bone marrow suppression from recent infection and side effects of broad-spectrum IV antibiotics. I agree with blood transfusion to keep hemoglobin greater than 8 g for now. 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 The acute drop of platelet count could be related to treatment with cefepime, vancomycin and non-immune mediated reaction to heparin. Additional testing for HIT in progress. As long as her platelet count is >50,000 there is no contraindication for anticoagulation therapy or antiplatelet therapy Her platelet count has improved.   5) Hospital acquired pneumonia Leukocytosis is improving. treatment is switched to levofloxacin. She had no further fevers   6) Constipation consider laxatives.  Will follow   Macaulay Reicher, MD 11/19/2015  11:36 AM

## 2015-11-19 NOTE — Progress Notes (Signed)
Pharmacy Antibiotic Note  Brenda Cruz is a 50 y.o. female on day # 4 antibiotics for HCAP.  Transitioned from Cefepime to Levaquin PO this morning. Also received Vancomycin 1/29-1/31.  Plan:  Levaquin 750 mg PO q24hrs begun this am.  Daily dose scheduled to separate Levaquin doses from BID iron by at least 2 hrs, to avoid potential inactivation and decreased effectiveness.  Height: 5\' 8"  (172.7 cm) Weight: 218 lb (98.884 kg) (scale a) IBW/kg (Calculated) : 63.9  Temp (24hrs), Avg:98 F (36.7 C), Min:97.5 F (36.4 C), Max:98.6 F (37 C)   Recent Labs Lab 11/15/15 1836 11/15/15 1841 11/15/15 1954 11/16/15 0114 11/16/15 0506 11/16/15 0918 11/17/15 0300 11/18/15 0330 11/19/15 0300  WBC 21.7*  --   --   --   --  18.2* 15.7* 9.4 7.4  CREATININE 1.13*  --   --   --   --  0.80 0.79 0.81 0.72  LATICACIDVEN  --  1.83 3.44* 0.6 0.8  --   --   --   --     Estimated Creatinine Clearance: 106.9 mL/min (by C-G formula based on Cr of 0.72).                      Antimicrobials this admission: Vanc 1/29>>1/31 Cefepime 1/29>>2/2 Levaquin PO 2/2>>  Microbiology results: 1/30 urine - insignificant growth (1000 colonies) 1/29 blood x 2 - ng x 3 days so far 1/29 Flu neg 1/29 HIV neg 1/29 Strep pneumo and Legionella antigens neg  Arty Baumgartner, [place] Pager: [phone] 11/19/2015 3:00 PM

## 2015-11-20 DIAGNOSIS — D638 Anemia in other chronic diseases classified elsewhere: Secondary | ICD-10-CM

## 2015-11-20 DIAGNOSIS — K5900 Constipation, unspecified: Secondary | ICD-10-CM

## 2015-11-20 DIAGNOSIS — I519 Heart disease, unspecified: Secondary | ICD-10-CM

## 2015-11-20 DIAGNOSIS — R079 Chest pain, unspecified: Secondary | ICD-10-CM

## 2015-11-20 DIAGNOSIS — R0602 Shortness of breath: Secondary | ICD-10-CM

## 2015-11-20 LAB — CULTURE, BLOOD (ROUTINE X 2)
CULTURE: NO GROWTH
CULTURE: NO GROWTH

## 2015-11-20 LAB — TYPE AND SCREEN
ABO/RH(D): A POS
ANTIBODY SCREEN: NEGATIVE
UNIT DIVISION: 0
Unit division: 0

## 2015-11-20 LAB — CBC
HCT: 27.9 % — ABNORMAL LOW (ref 36.0–46.0)
Hemoglobin: 9.1 g/dL — ABNORMAL LOW (ref 12.0–15.0)
MCH: 28.3 pg (ref 26.0–34.0)
MCHC: 32.6 g/dL (ref 30.0–36.0)
MCV: 86.6 fL (ref 78.0–100.0)
PLATELETS: 182 10*3/uL (ref 150–400)
RBC: 3.22 MIL/uL — AB (ref 3.87–5.11)
RDW: 15.2 % (ref 11.5–15.5)
WBC: 9 10*3/uL (ref 4.0–10.5)

## 2015-11-20 LAB — BASIC METABOLIC PANEL
Anion gap: 10 (ref 5–15)
CO2: 27 mmol/L (ref 22–32)
CREATININE: 0.7 mg/dL (ref 0.44–1.00)
Calcium: 8.5 mg/dL — ABNORMAL LOW (ref 8.9–10.3)
Chloride: 103 mmol/L (ref 101–111)
GFR calc Af Amer: >60 mL/min (ref >60)
GLUCOSE: 174 mg/dL — AB (ref 65–99)
POTASSIUM: 3.4 mmol/L — AB (ref 3.5–5.1)
SODIUM: 140 mmol/L (ref 135–145)

## 2015-11-20 LAB — PROTHROMBIN GENE MUTATION

## 2015-11-20 LAB — FACTOR 5 LEIDEN

## 2015-11-20 MED ORDER — LEVALBUTEROL HCL 1.25 MG/0.5ML IN NEBU
1.2500 mg | INHALATION_SOLUTION | Freq: Three times a day (TID) | RESPIRATORY_TRACT | Status: DC
  Administered 2015-11-20 (×2): 1.25 mg via RESPIRATORY_TRACT
  Filled 2015-11-20 (×3): qty 0.5

## 2015-11-20 MED ORDER — LEVALBUTEROL HCL 1.25 MG/0.5ML IN NEBU: 1.2500 mg | INHALATION_SOLUTION | Freq: Two times a day (BID) | RESPIRATORY_TRACT | Status: DC

## 2015-11-20 MED ORDER — OXYCODONE-ACETAMINOPHEN 5-325 MG PO TABS
1.0000 | ORAL_TABLET | ORAL | Status: DC | PRN
  Administered 2015-11-20: 2 via ORAL
  Filled 2015-11-20 (×2): qty 2

## 2015-11-20 MED ORDER — RIVAROXABAN 15 MG PO TABS
15.0000 mg | ORAL_TABLET | Freq: Two times a day (BID) | ORAL | Status: DC
  Administered 2015-11-21: 15 mg via ORAL
  Filled 2015-11-20: qty 1

## 2015-11-20 MED ORDER — POTASSIUM CHLORIDE CRYS ER 20 MEQ PO TBCR
40.0000 meq | EXTENDED_RELEASE_TABLET | Freq: Once | ORAL | Status: AC
  Administered 2015-11-20: 40 meq via ORAL
  Filled 2015-11-20: qty 2

## 2015-11-20 NOTE — Progress Notes (Signed)
PT Cancellation Note  Patient Details Name: Brenda Cruz MRN: [id number] DOB: [birth year]   Cancelled Treatment:    Reason Eval/Treat Not Completed: Patient at procedure or test/unavailable.  Will attempt another time.   Ramond Dial 11/20/2015, 12:55 PM   Mee Hives, PT MS Acute Rehab Dept. Number: [facility] [id number] and [place] [phone]

## 2015-11-20 NOTE — Progress Notes (Signed)
Brenda Cruz   DOB:1966-06-22   [id number]    Subjective: She felt better. Pleuritic chest pain has improved and she denies shortness of breath. She felt energy level has improved since she received blood transfusion. The patient denies any recent signs or symptoms of bleeding such as spontaneous epistaxis, hematuria or hematochezia. Denies leg swelling. Constipation has resolved  Objective:  Filed Vitals:   11/20/15 0531 11/20/15 1000  BP: 143/70 142/72  Pulse: 96 105  Temp: 98.5 F (36.9 C)   Resp:       Intake/Output Summary (Last 24 hours) at 11/20/15 1417 Last data filed at 11/20/15 1411  Gross per 24 hour  Intake    660 ml  Output    850 ml  Net   -190 ml    GENERAL:alert, no distress and comfortable SKIN: skin color, texture, turgor are normal, no rashes or significant lesions EYES: normal, Conjunctiva are pink and non-injected, sclera clear OROPHARYNX:no exudate, no erythema and lips, buccal mucosa, and tongue normal  Musculoskeletal:no cyanosis of digits and no clubbing  NEURO: alert & oriented x 3 with fluent speech, no focal motor/sensory deficits   Labs:  Lab Results  Component Value Date   WBC 9.0 11/20/2015   HGB 9.1* 11/20/2015   HCT 27.9* 11/20/2015   MCV 86.6 11/20/2015   PLT 182 11/20/2015   NEUTROABS 17.8* 11/15/2015    Lab Results  Component Value Date   NA 140 11/20/2015   K 3.4* 11/20/2015   CL 103 11/20/2015   CO2 27 11/20/2015    Assessment & Plan:   Left lower extremity DVT +/- bilateral PE I reviewed with the patient about the plan for care for provoked DVT. It is still unclear whether she had bilateral PE but due to tachycardia, chest pain and abnormal VQ scan, she is being treated for presumed bilateral PE as well.  Her hypercoagulation panel is non-diagnostic (in the setting of IV heparin). 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 I reviewed the research with the patient and her  husband. The strongest data for anticoagulation therapy for HIT related thrombotic episodes would be to use warfarin. The newer agents have no data to be used in this situation although they are approved for DVT/PE from other causes. We discussed extensively with the risk, benefit, side effects of NOAC versus warfarin, availability of antidote and risk of bleeding. Ultimately, after prolonged discussion, she has me informed consent to proceed with Xarelto. Duration of treatment would be minimum 6 months, possibly to a year. I will follow-up with the patient in the outpatient clinic to determine the length of treatment   Iron deficiency anemia and anemia of chronic disease She had received iV iron and would benefit from oral iron replacement therapy. She has worsening anemia but based on recent lack of reticulocytosis, I think were looking at anemia of chronic disease, possibly with bone marrow suppression from recent infection and side effects of broad-spectrum IV antibiotics. I agree with blood transfusion to keep hemoglobin greater than 8 g for now. She has no clinical signs of bleeding and I think it is safe for [place] to continue on anticoagulation therapy for now  I will recheck her blood work when I see her back in my office in 2 weeks. 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 The  acute drop of platelet count could be related to treatment with cefepime, vancomycin and non-immune mediated reaction to heparin. OD is high with HIT panel. This is considered positive. SRA is pending.  As long as her platelet count is >50,000 there is no contraindication for anticoagulation therapy or antiplatelet there I will enter heparin into allergy list  Hospital acquired pneumonia Leukocytosis is improving. Treatment is switched to levofloxacin. She had no further fevers    Constipation Continue laxatives.  Recent heart disease Consider reducing dose of aspirin in view of long term Xarelto  Discharge planning OK to be discharged from hematology stand point I will sign off. Appointment made for 2/201/7    [facility], [place], MD 11/20/2015  2:17 PM

## 2015-11-20 NOTE — Progress Notes (Signed)
ANTICOAGULATION CONSULT NOTE - Follow Up Consult  Pharmacy Consult for Arixtra -> Xarelto Indication: pulmonary embolus and DVT  Patient Measurements: Height: 5\' 8"  (172.7 cm) Weight: 211 lb 12.8 oz (96.072 kg) (a scale) IBW/kg (Calculated) : 63.9  Vital Signs: Temp: 98.5 F (36.9 C) (02/03 0531) Temp Source: Oral (02/03 0531) BP: 142/72 mmHg (02/03 1000) Pulse Rate: 105 (02/03 1000)  Labs:  Recent Labs  11/18/15 0330 11/19/15 0300 11/20/15 0400  HGB 8.1* 7.3* 9.1*  HCT 25.0* 24.2* 27.9*  PLT 125* 150 182  CREATININE 0.81 0.72 0.70    Estimated Creatinine Clearance: 105.4 mL/min (by C-G formula based on Cr of 0.7).  Assessment:  To transition from Arixtra 10 mg sq q24hrs to Xarelto for LLE DVT +/- bilateral PE.   Hematology directing anticoagulation.  HIT antibodies positive, SRA sent 11/18/15 and in process. Platelet count up to 182.    Anticoagulants this admission:      IV heparin 1/30>>1/31      Xarelto 1/31>>2/1      Arixtra 2/2>>2/3 - last dose ~6am today.  Goal of Therapy:  treatment dose Xarelto Monitor platelets by anticoagulation protocol: Yes   Plan:   Discontinue Arixtra.  Begin Xarelto 15 mg BID in am and continue x 21 days.   Then, Xarelto 20 mg daily with supper to begin on 11/11/15.  Discussed with patient.  Expecting discharge on 2/4 with Xarelto Starter pack.  Arty Baumgartner, [place] Pager: [phone] 11/20/2015,4:19 PM

## 2015-11-20 NOTE — Progress Notes (Signed)
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  Pt performing toileting, standing grooming at a supervision to modified independent level.  Educated pt in benefits of sitting during showering and recommended shower seat. Pt and husband receptive.   Follow Up Recommendations  No OT follow up    Equipment Recommendations  None recommended by OT    Recommendations for Other Services      Precautions / Restrictions Precautions Precautions: Sternal Precaution Comments: watch 02       Mobility Bed Mobility Overal bed mobility: Needs Assistance Bed Mobility: Sit to Supine       Sit to supine: Supervision   General bed mobility comments: HOB up, cues to avoid UE use  Transfers Overall transfer level: Needs assistance Equipment used: None Transfers: Sit to/from Stand Sit to Stand: Modified independent (Device/Increase time)         General transfer comment: no physical assist from toilet or recliner, reminders to avoid pushing up with hands    Balance                                   ADL Overall ADL's : Needs assistance/impaired     Grooming: Wash/dry hands;Oral care;Standing;Supervision/safety           Upper Body Dressing : Set up;Sitting   Lower Body Dressing: Set up;Sit to/from stand   Toilet Transfer: Modified Independent;Ambulation   Toileting- Clothing Manipulation and Hygiene: Modified independent;Sit to/from stand       Functional mobility during ADLs: Modified independent (no device within room, bathroom) General ADL Comments: Pt educated in energy  conservation strategies and pursed lip breathing techniques.      Vision                     Perception     Praxis      Cognition   Behavior During Therapy: WFL for tasks assessed/performed Overall Cognitive Status: Within Functional Limits for tasks assessed                       Extremity/Trunk Assessment               Exercises     Shoulder Instructions       General Comments      Pertinent Vitals/ Pain       Pain Assessment: Faces Faces Pain Scale: Hurts little more Pain Location: sternum Pain Descriptors / Indicators: Grimacing;Guarding Pain Intervention(s): Limited activity within patient's tolerance;Monitored during session;Repositioned  Home Living                                          Prior Functioning/Environment              Frequency Min 2X/week     Progress Toward Goals  OT Goals(current goals can now be found in the care plan section)  Progress towards OT goals: Progressing toward goals     Plan Discharge plan remains  appropriate    Co-evaluation                 End of Session Equipment Utilized During Treatment: Oxygen   Activity Tolerance Patient limited by fatigue   Patient Left in bed;with call bell/phone within reach;with family/visitor present   Nurse Communication  (ok to give peanut butter and crackers)        Time: [id number] OT Time Calculation (min): 40 min  Charges: OT General Charges $OT Visit: 1 Procedure OT Treatments $Self Care/Home Management : 23-37 mins  Malka So 11/20/2015, 1:32 PM  [phone]

## 2015-11-20 NOTE — Progress Notes (Addendum)
[facility]                                                                              Patient Demographics  Brenda Cruz, is a 50 y.o. female, DOB - Jan 06, 1966, DUK:[id number]  Admit date - 11/15/2015   Admitting Physician Ivor Costa, MD  Outpatient Primary MD for the patient is Pcp Not In System  LOS - 5   Chief Complaint  Patient presents with  . Shortness of Breath  . Back Pain  . Post-op Problem       Brief HPI   HPI on 11/15/2015 by Dr. Ivor Costa Brenda Cruz is a 50 y.o. female with PMH of CAD, s/p of CABG 11/02/15 by Dr. Roxan Hockey, hyperlipidemia, asthma, GERD, depression, anxiety, DDD, TMG, who presented with right sided flank pain and chest pain, shortness of breath and fever. Patient was recently hospitalized from 01/12-1/20/17 and had CABG done by Dr. Roxan Hockey on 11/02/15. Patient was doing fine until the day before the admission, when she started having shortness and right flank pain and R lower chest pain, severe, constant, sharp, pleuritic. Patient also reported mild cough with small amount dark colored sputum production. Denied any fevers at home but was found to have temp of 101.2 in the emergency room. No chills. Patient reported that she had tenderness over left calf area two days ago prior to admission, per her report had resolved. In ED, patient was found to have WBC 21.7, lactate 1.83-->3.44, negative troponin, negative pregnancy test, temperature 11.2, tachycardia, tachypnea, acute renal injury. CT-chest showed bilateral lower lobe atelectasis versus infiltrates and small right pleural effusion, but no evidence of mediastinal hematoma or evidence of pericardial effusion. Patient is admitted to inpatient for further eval and treatment. EDP consulted on-call cardiothoracic surgeon.   Assessment & Plan   Acute respiratory insufficiency secondary to possible HCAP vs PE: Presenting with pleuritic chest pain, tachycardia and  tachypnea. -Improving, O2 sats 98% on 2 L, home O2 evaluation -> will need 2 L O2 continuous -CT chest without contrast showed bilateral lower lobe atelectasis versus infiltrate and small right pleural effusion -VQ scan obtained as patient has allergy to contrast dye: intermediate probability for PE -LE doppler: +DVT left distal femoral, popliteal, posterior tibial veins -Echocardiogram: EF 37-62%, grade 1 diastolic dysfunction -Influenza negative -PT and OT recommended home health PT -Discontinued IV heparin and transitioned to Xarelto; however, in light of patient's thrombocytopenia, discontinued Xarelto and currently 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  Sepsis possibly secondary to pneumonia - Discussed in detail with Dr Alvy Bimler, possibility of bone marrow suppression with vancomycin and cefepime as patient's hemoglobin is trending down. No obvious bleeding. - Discontinued IV vancomycin and cefepime, Continue oral levofloxacin - Blood cultures show no growth to date -Strep pneumonia urine Ag negative, urine Legionella antigen negative - Home O2 evaluation   Left lower extremity DVT +/- bilateral pulmonary embolism  -AntithrombIII 96, Protein C activity 112, Protein C 75, Protein S Activity 66, Protein S Ag 116 -PTT lupus AC 110 (high), DRVVT 82 (high) -Factor 5 and prothrombin gene mutation still pending - Plan to continue treatment with Arixtra, HIT +.  Discussed with Dr. Alvy Bimler, plan to discuss options with patient today. Will await recommendations. Addendum:  Spoke with Dr. Alvy Bimler who met with the patient and her husband regarding options about anticoagulation. Patient and her husband prefer Xarelto at this time. Requested pharmacy to start [place] today. Will obtain PT evaluation, home O2 evaluation. Hopefully DC home in a.m. patient agreeable with the plan. Hematology follow-up with Dr Alvy Bimler on 2/21  Coronary artery disease -Status post CABG on 11/02/2015 -Cardiothoracic surgery made  aware of patient's admission -Troponin mildly elevated however flat -Continue aspirin, statin, beta blocker  -Patient refused statin at last discharge  Essential hypertension - Lisinopril held due to acute renal injury -Continue metoprolol and IV hydralazine as needed  Acute kidney injury- resolved -Likely secondary to sepsis and dehydration versus medication -Continue to monitor BMP  GERD -Continue PPI  Depression and anxiety -Continue Prozac and Ativan  Chronic normocytic anemia -Baseline hemoglobin approximately 10 -Anemia panel: Iron 7, Ferritin 281 -Pending FOBT -Given dose of feraheme -Started on oral iron supplementation  - Hemoglobin 7.3 today, Transfuse 2 units packed RBCs  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 Alvy Bimler, cont Arixtra until further recommendations today.  Hyperkalemia -Resolved, continue to monitor BMP  Code Status: FC   Family Communication: Discussed in detail with the patient, all imaging results, lab results explained to the patient    Disposition Plan:   Time Spent in minutes 25 minutes  Procedures  VQ Scan LE doppler Echocardiogram  Consults   Hematology  DVT Prophylaxis  arixtra  Medications  Scheduled Meds: . aspirin EC  81 mg Oral Daily  . dextromethorphan-guaiFENesin  1 tablet Oral BID  . docusate sodium  100 mg Oral BID  . ferrous sulfate  325 mg Oral BID WC  . FLUoxetine  20 mg Oral Daily  . fondaparinux (ARIXTRA) injection  10 mg Subcutaneous Q0600  . hydrocortisone   Rectal BID  . levalbuterol  1.25 mg Nebulization BID  . levofloxacin  750 mg Oral Daily  . metoprolol  25 mg Oral BID  . pantoprazole  40 mg Oral Daily  . pravastatin  40 mg Oral q1800   Continuous Infusions:  PRN Meds:.acetaminophen, hydrALAZINE, HYDROmorphone (DILAUDID) injection, LORazepam,  oxyCODONE-acetaminophen, simethicone, technetium TC 56M diethylenetriame-pentaacetic acid   Antibiotics   Anti-infectives    Start     Dose/Rate Route Frequency Ordered Stop   11/19/15 1000  levofloxacin (LEVAQUIN) tablet 750 mg     750 mg Oral Daily 11/19/15 0830     11/15/15 2000  ceFEPIme (MAXIPIME) 1 g in dextrose 5 % 50 mL IVPB  Status:  Discontinued     1 g 100 mL/hr over 30 Minutes Intravenous 3 times per day 11/15/15 1958 11/19/15 0812   11/15/15 2000  vancomycin (VANCOCIN) 1,250 mg in sodium chloride 0.9 % 250 mL IVPB     1,250 mg 166.7 mL/hr over 90 Minutes Intravenous Every 12 hours 11/15/15 1958 11/17/15 2129        Subjective:   Jamillah Camilo was seen and examined today. Right-sided pain improving, shortness of breath improving, no acute complaints at this time.  No fevers or chills. Patient denies dizziness, abdominal pain, N/V/D/C, new weakness, numbess, tingling. No acute events overnight.    Objective:   Blood pressure 142/72, pulse 105, temperature 98.5 F (36.9 C), temperature source Oral, resp. rate 17, height _0  (1.727 m), weight  96.072 kg (211 lb 12.8 oz), last menstrual period 11/01/2015, SpO2 94 %.  Wt Readings from Last 3 Encounters:  11/20/15 96.072 kg (211 lb 12.8 oz)  11/06/15 103.1 kg (227 lb 4.7 oz)  10/16/15 103.602 kg (228 lb 6.4 oz)     Intake/Output Summary (Last 24 hours) at 11/20/15 1138 Last data filed at 11/20/15 3790  Gross per 24 hour  Intake    780 ml  Output   1550 ml  Net   -770 ml    Exam  General: Alert and oriented x 3, NAD  HEENT:  PERRLA, EOMI,   Neck: Supple, no JVD, no masses  CVS: S1 S2 clear, RRR  Respiratory: CTAB  Abdomen: Soft, nontender, nondistended, + bowel sounds  Ext: no cyanosis clubbing or edema  Neuro: no new deficits  Skin: No rashes  Psych: Normal affect and demeanor, alert and oriented x3    Data Review   Micro Results Recent Results (from the past 240 hour(s))  Blood culture  (routine x 2)     Status: None (Preliminary result)   Collection Time: 11/15/15  8:35 PM  Result Value Ref Range Status   Specimen Description BLOOD LEFT FOREARM  Final   Special Requests BOTTLES DRAWN AEROBIC AND ANAEROBIC 5CC   Final   Culture NO GROWTH 4 DAYS  Final   Report Status PENDING  Incomplete  Blood culture (routine x 2)     Status: None (Preliminary result)   Collection Time: 11/15/15  9:46 PM  Result Value Ref Range Status   Specimen Description BLOOD LEFT HAND  Final   Special Requests BOTTLES DRAWN AEROBIC AND ANAEROBIC 5CC  Final   Culture NO GROWTH 4 DAYS  Final   Report Status PENDING  Incomplete  Culture, Urine     Status: None   Collection Time: 11/16/15 10:30 PM  Result Value Ref Range Status   Specimen Description URINE, CLEAN CATCH  Final   Special Requests NONE  Final   Culture 1,000 COLONIES/mL INSIGNIFICANT GROWTH  Final   Report Status 11/18/2015 FINAL  Final    Radiology Reports Dg Chest 2 View  11/15/2015  CLINICAL DATA:  Right-sided chest tightness and shortness of breath EXAM: CHEST  2 VIEW COMPARISON:  11/04/2015 FINDINGS: Streaky and linear opacity in the lung bases is probably related atelectasis although pneumonia medial lung bases cannot be entirely excluded. Opacity seen posteriorly in the costophrenic sulcus on the lateral film. Lung volumes are low. Patient is status post median sternotomy. The visualized bony structures of the thorax are intact. IMPRESSION: Interval decrease in volume with interval development of bibasilar collapse/ consolidation. Electronically Signed   By: Misty Stanley M.D.   On: 11/15/2015 17:50   Dg Chest 2 View  10/29/2015  CLINICAL DATA:  Mid chest pressure off and on for 3 weeks with exertion, hypertension, asthma, smoker EXAM: CHEST  2 VIEW COMPARISON:  03/20/2014 ; correlation CT chest 10/13/2011 FINDINGS: Normal heart size, mediastinal contours, and pulmonary vascularity. Minimal central peribronchial thickening. Focus  of scarring in lingula adjacent to LEFT heart border similar to prior CT. No acute infiltrate, pleural effusion or pneumothorax. Bones unremarkable. IMPRESSION: Bronchitic changes with lingular scarring. No acute abnormalities. Electronically Signed   By: Lavonia Dana M.D.   On: 10/29/2015 10:27   Ct Chest Wo Contrast  11/15/2015  CLINICAL DATA:  Shortness of breath. Right-sided back pain. Pneumonia. One week postop from CABG. EXAM: CT CHEST WITHOUT CONTRAST TECHNIQUE: Multidetector CT imaging of the chest  was performed following the standard protocol without IV contrast. COMPARISON:  10/13/2011 FINDINGS: Mediastinum/Lymph Nodes: No masses or pathologically enlarged lymph nodes identified on this un-enhanced exam. No evidence of mediastinal hematoma. Recent postop changes from CABG noted. No evidence of pericardial effusion. Lungs/Pleura: Bilateral lower lobe atelectasis or infiltrates are seen. Small right pleural effusion noted. No evidence of pneumothorax. Upper abdomen: No acute findings. Musculoskeletal: No chest wall mass or suspicious bone lesions identified. IMPRESSION: Bilateral lower lobe atelectasis versus infiltrates and small right pleural effusion. Electronically Signed   By: Earle Gell M.D.   On: 11/15/2015 20:31   Nm Pulmonary Perf And Vent  11/16/2015  CLINICAL DATA:  Shortness of breath.  Pleuritic chest pain. EXAM: NUCLEAR MEDICINE VENTILATION - PERFUSION LUNG SCAN TECHNIQUE: Ventilation images were obtained in multiple projections using inhaled aerosol Tc-[id number]. Perfusion images were obtained in multiple projections after intravenous injection of Tc-950mAA. RADIOPHARMACEUTICALS:  31 millicurie [id number] aerosol inhalation and 4.1 millicurie [id number] IV COMPARISON:  Chest x-ray and CT from earlier today FINDINGS: There is a moderate sized area of left upper lobe posterior segment matched defect. There is a large peripheral area of right lower lobe basilar segment  perfusion defect which is triple matched (see RPO). IMPRESSION: Intermediate probability category (20-79%) by PIOPED II. Electronically Signed   By: JoMonte Fantasia.D.   On: 11/16/2015 00:41   Dg Chest Port 1 View  11/04/2015  CLINICAL DATA:  CABG EXAM: PORTABLE CHEST 1 VIEW COMPARISON:  11/03/2015 FINDINGS: Swan-Ganz catheter removed. Left chest tube removed. Mediastinal drain removed. Central venous catheters have been removed. Negative for pneumothorax. Negative for heart failure or effusion. Lungs are clear. IMPRESSION: Support lines have been removed.  No acute cardiopulmonary disease. Electronically Signed   By: ChFranchot Gallo.D.   On: 11/04/2015 08:08   Dg Chest Port 1 View  11/03/2015  CLINICAL DATA:  Patient status post CABG procedure. EXAM: PORTABLE CHEST 1 VIEW COMPARISON:  Chest radiograph 11/02/2015. FINDINGS: PA catheter tip projects over the expected location the proximal right main pulmonary artery. Bilateral chest tubes in place. Interval extubation and removal of enteric tube. Multiple monitoring leads course over the patient. Stable cardiac and mediastinal contours status post median sternotomy. Focal opacity left lung base, unchanged from prior. Interval improvement in previously described perihilar interstitial opacities. No large pleural effusion or pneumothorax. IMPRESSION: Interval extubation. Probable focal atelectasis left lung base. Interval retraction pulmonary arterial catheter with tip projecting over the expected location main right pulmonary artery. Electronically Signed   By: DrLovey Newcomer.D.   On: 11/03/2015 08:14   Dg Chest Port 1 View  11/02/2015  CLINICAL DATA:  Status post CABG today.  Initial encounter. EXAM: PORTABLE CHEST 1 VIEW COMPARISON:  PA and lateral chest 10/29/2015. FINDINGS: Endotracheal tube is in place with the tip in good position at the level of the clavicular heads. Right IJ approach Swan-Ganz catheter is also in place with the tip in the the distal  descending interlobar pulmonary artery on the right. Recommend withdrawal approximately 7.5 cm. Bilateral chest tubes are noted. No pneumothorax. Mild basilar atelectasis is seen. Six intact median sternotomy wires are identified. IMPRESSION: Right IJ approach Swan-Ganz catheter tip is in the distal descending interlobar pulmonary artery on the right. Recommend withdrawal of 7.5 cm. Support apparatus otherwise projects in good position. No pneumothorax. Bibasilar atelectasis. These results were called by telephone at the time of interpretation on 11/02/2015 at 12:24 pm to the patient's nurse PaPrecious Bardwho  verbally acknowledged these results. Electronically Signed   By: Inge Rise M.D.   On: 11/02/2015 12:28    CBC  Recent Labs Lab 11/15/15 1836 11/16/15 0918 11/17/15 0300 11/18/15 0330 11/19/15 0300 11/20/15 0400  WBC 21.7* 18.2* 15.7* 9.4 7.4 9.0  HGB 10.8* 9.3* 8.0* 8.1* 7.3* 9.1*  HCT 34.0* 28.5* 25.7* 25.0* 24.2* 27.9*  PLT 208 151 136* 125* 150 182  MCV 90.4 91.6 91.1 91.2 88.6 86.6  MCH 28.7 29.9 28.4 29.6 26.7 28.3  MCHC 31.8 32.6 31.1 32.4 30.2 32.6  RDW 13.1 13.5 13.4 13.5 13.6 15.2  LYMPHSABS 1.7  --   --   --   --   --   MONOABS 2.1*  --   --   --   --   --   EOSABS 0.1  --   --   --   --   --   BASOSABS 0.0  --   --   --   --   --     Chemistries   Recent Labs Lab 11/15/15 1836 11/16/15 0918 11/17/15 0300 11/18/15 0330 11/19/15 0300 11/20/15 0400  NA 135 137 134* 136 137 140  K 5.0 5.2* 4.4 4.5 3.9 3.4*  CL 97* 106 104 103 102 103  CO2 22 17* _0 GLUCOSE 174* 129* 129* 164* 141* 174*  BUN _1 <5*  CREATININE 1.13* 0.80 0.79 0.81 0.72 0.70  CALCIUM 9.7 8.4* 8.2* 8.6* 8.3* 8.5*  AST 24  --   --   --   --   --   ALT 17  --   --   --   --   --   ALKPHOS 75  --   --   --   --   --   BILITOT 0.9  --   --   --   --   --     ------------------------------------------------------------------------------------------------------------------ estimated creatinine clearance is 105.4 mL/min (by C-G formula based on Cr of 0.7). ------------------------------------------------------------------------------------------------------------------ No results for input(s): HGBA1C in the last 72 hours. ------------------------------------------------------------------------------------------------------------------ No results for input(s): CHOL, HDL, LDLCALC, TRIG, CHOLHDL, LDLDIRECT in the last 72 hours. ------------------------------------------------------------------------------------------------------------------ No results for input(s): TSH, T4TOTAL, T3FREE, THYROIDAB in the last 72 hours.  Invalid input(s): FREET3 ------------------------------------------------------------------------------------------------------------------  Recent Labs  11/17/15 1140  VITAMINB12 817  FOLATE 11.7  FERRITIN 281  TIBC 253  IRON 7*  RETICCTPCT 1.8    Coagulation profile  Recent Labs Lab 11/16/15 0114  INR 1.36    No results for input(s): DDIMER in the last 72 hours.  Cardiac Enzymes  Recent Labs Lab 11/16/15 0114 11/16/15 0506 11/16/15 0918  TROPONINI 0.05* 0.05* 0.04*   ------------------------------------------------------------------------------------------------------------------ Invalid input(s): POCBNP  No results for input(s): GLUCAP in the last 72 hours.   Shakiah Wester M.D. [facility] 11/20/2015, 11:38 AM  Pager: [phone] Between 7am to 7pm - call Pager - [phone]  After 7pm go to [email or web address] - password [email or web address]  Call night coverage person covering after 7pm

## 2015-11-20 NOTE — Discharge Instructions (Signed)
Information on my medicine - XARELTO (rivaroxaban)  This medication education was reviewed with me or my healthcare representative as part of my discharge preparation.  The pharmacist that spoke with me during my hospital stay was:  Eudelia Bunch, [place]? Xarelto was prescribed to treat blood clots that may have been found in the veins of your legs (deep vein thrombosis) or in your lungs (pulmonary embolism) and to reduce the risk of them occurring again.  What do you need to know about Xarelto? The starting dose is one 15 mg tablet taken TWICE daily with food for the FIRST 21 DAYS then on (enter date)  11/11/15  the dose is changed to one 20 mg tablet taken ONCE A DAY with your evening meal.  DO NOT stop taking Xarelto without talking to the health care provider who prescribed the medication.  Refill your prescription for 20 mg tablets before you run out.  After discharge, you should have regular check-up appointments with your healthcare provider that is prescribing your Xarelto.  In the future your dose may need to be changed if your kidney function changes by a significant amount.  What do you do if you miss a dose? If you are taking Xarelto TWICE DAILY and you miss a dose, take it as soon as you remember. You may take two 15 mg tablets (total 30 mg) at the same time then resume your regularly scheduled 15 mg twice daily the next day.  If you are taking Xarelto ONCE DAILY and you miss a dose, take it as soon as you remember on the same day then continue your regularly scheduled once daily regimen the next day. Do not take two doses of Xarelto at the same time.   Important Safety Information Xarelto is a blood thinner medicine that can cause bleeding. You should call your healthcare provider right away if you experience any of the following: ? Bleeding from an injury or your nose that does not stop. ? Unusual colored urine (red or dark brown) or  unusual colored stools (red or black). ? Unusual bruising for unknown reasons. ? A serious fall or if you hit your head (even if there is no bleeding).  Some medicines may interact with Xarelto and might increase your risk of bleeding while on Xarelto. To help avoid this, consult your healthcare provider or pharmacist prior to using any new prescription or non-prescription medications, including herbals, vitamins, non-steroidal anti-inflammatory drugs (NSAIDs) and supplements.  This website has more information on Xarelto: [email or web address] on my medicine - XARELTO (rivaroxaban)  This medication education was reviewed with me or my healthcare representative as part of my discharge preparation.  The pharmacist that spoke with me during my hospital stay was:  Eudelia Bunch, [place]? Xarelto was prescribed to treat blood clots that may have been found in the veins of your legs (deep vein thrombosis) or in your lungs (pulmonary embolism) and to reduce the risk of them occurring again.  What do you need to know about Xarelto? The starting dose is one 15 mg tablet taken TWICE daily with food for the FIRST 21 DAYS then on Wednesay 2/22 or after you have taken all of the 15 mg tablets  the dose is changed to one 20 mg tablet taken ONCE A DAY with your evening meal.  DO NOT stop taking Xarelto without talking to the health care provider who prescribed the medication.  Refill your prescription for 20 mg tablets before you run out.  After discharge, you should have regular check-up appointments with your healthcare provider that is prescribing your Xarelto.  In the future your dose may need to be changed if your kidney function changes by a significant amount.  What do you do if you miss a dose? If you are taking Xarelto TWICE DAILY and you miss a dose, take it as soon as you remember. You may take two 15 mg tablets (total 30 mg) at the same time then  resume your regularly scheduled 15 mg twice daily the next day.  If you are taking Xarelto ONCE DAILY and you miss a dose, take it as soon as you remember on the same day then continue your regularly scheduled once daily regimen the next day. Do not take two doses of Xarelto at the same time.   Important Safety Information Xarelto is a blood thinner medicine that can cause bleeding. You should call your healthcare provider right away if you experience any of the following: ? Bleeding from an injury or your nose that does not stop. ? Unusual colored urine (red or dark brown) or unusual colored stools (red or black). ? Unusual bruising for unknown reasons. ? A serious fall or if you hit your head (even if there is no bleeding).  Some medicines may interact with Xarelto and might increase your risk of bleeding while on Xarelto. To help avoid this, consult your healthcare provider or pharmacist prior to using any new prescription or non-prescription medications, including herbals, vitamins, non-steroidal anti-inflammatory drugs (NSAIDs) and supplements.  This website has more information on Xarelto: [email or web address]

## 2015-11-20 NOTE — Progress Notes (Signed)
Physical Therapy Treatment Patient Details Name: Brenda Cruz MRN: [id number] DOB: [birth year] Today's Date: 11/20/2015    History of Present Illness 50 yo female admitted with PE and L LE DVT Pt recently hospitalized 10/29/15-11/06/15 PMH: CABG 11/02/15, CAD, GERD, Depression, anxiety, DDD, TMG, asthma    PT Comments    Patient agreeable to increased activity this session despite generalized fatigue. Patient ambulated in hall on room air with saturations remaining >92% despite DOE. Patient re-educated on sternal precautions and techniques for safety with transfers and mobility. Utilized RW for mobility for energy conservation and safety.   Follow Up Recommendations  Home health PT;Supervision/Assistance - 24 hour     Equipment Recommendations  Rolling Walker with 5"' wheels (pending progress)   Recommendations for Other Services       Precautions / Restrictions Precautions Precautions: Sternal Precaution Comments: watch 02 Restrictions Weight Bearing Restrictions: No    Mobility  Bed Mobility Overal bed mobility: Needs Assistance Bed Mobility: Supine to Sit;Sit to Supine     Supine to sit: Min guard (VCs for technique secondary to sternal precautions) Sit to supine: Supervision   General bed mobility comments: HOB up, cues to avoid UE use  Transfers Overall transfer level: Needs assistance Equipment used: None Transfers: Sit to/from Stand Sit to Stand: Modified independent (Device/Increase time)         General transfer comment: no assist to come to standing, increased fatigue therefore decided to use RW during ambulation  Ambulation/Gait Ambulation/Gait assistance: Supervision Ambulation Distance (Feet): 210 Feet Assistive device: Rolling walker (2 wheeled) Gait Pattern/deviations: Step-through pattern;Decreased stride length Gait velocity: decreased Gait velocity interpretation: Below normal speed for age/gender General Gait Details: ambulated on room air  with saturations remaining >92% with activity, but patient did have noted DOE.    Stairs            Wheelchair Mobility    Modified Rankin (Stroke Patients Only)       Balance Overall balance assessment: Needs assistance Sitting-balance support: Feet supported Sitting balance-Leahy Scale: Fair     Standing balance support: No upper extremity supported Standing balance-Leahy Scale: Fair Standing balance comment: some instbaility noted, bilateral LE weakness secondary to fatigue                    Cognition Arousal/Alertness: Awake/alert Behavior During Therapy: WFL for tasks assessed/performed Overall Cognitive Status: Within Functional Limits for tasks assessed                      Exercises      General Comments General comments (skin integrity, edema, etc.): Patient educated regarding techniques for transfers with sternal precautions as well as use of heart pillow for splinting. Patient with poor compliance of precautions during activity despite cues.       Pertinent Vitals/Pain Pain Assessment: 0-10 Pain Score: 7  Faces Pain Scale: Hurts little more Pain Location: chest and Bilateral LEs Pain Descriptors / Indicators: Grimacing;Guarding Pain Intervention(s): Limited activity within patient's tolerance;Monitored during session;Patient requesting pain meds-RN notified;Utilized relaxation techniques    Home Living                      Prior Function            PT Goals (current goals can now be found in the care plan section) Acute Rehab PT Goals Patient Stated Goal: to take care of 9 yo son PT Goal Formulation: With patient Time For Goal Achievement:  12/02/15 Potential to Achieve Goals: Good Progress towards PT goals: Progressing toward goals    Frequency  Min 3X/week    PT Plan Current plan remains appropriate    Co-evaluation             End of Session Equipment Utilized During Treatment: Gait  belt;Oxygen Activity Tolerance: Patient limited by fatigue Patient left: in bed;with call bell/phone within reach     Time: [id number] PT Time Calculation (min) (ACUTE ONLY): 19 min  Charges:  $Gait Training: 8-22 mins                    G CodesDuncan Dull November 24, 2015, 4:01 PM Alben Deeds, [place] DPT  [phone]

## 2015-11-20 NOTE — Progress Notes (Signed)
PT Cancellation Note  Patient Details Name: Brenda Cruz MRN: [id number] DOB: [birth year]   Cancelled Treatment:    Reason Eval/Treat Not Completed: Other (comment) (Just walked with nursing to check O2 sats with no cannula).  Will try later as time and pt willingness allow.   Ramond Dial 11/20/2015, 11:17 AM   Mee Hives, PT MS Acute Rehab Dept. Number: [facility] [id number] and [place] [phone]

## 2015-11-20 NOTE — Progress Notes (Signed)
1800 not in any distress . Pt claimed feeling bette now

## 2015-11-21 LAB — CBC
HCT: 28.8 % — ABNORMAL LOW (ref 36.0–46.0)
Hemoglobin: 9.6 g/dL — ABNORMAL LOW (ref 12.0–15.0)
MCH: 29.4 pg (ref 26.0–34.0)
MCHC: 33.3 g/dL (ref 30.0–36.0)
MCV: 88.1 fL (ref 78.0–100.0)
PLATELETS: 173 10*3/uL (ref 150–400)
RBC: 3.27 MIL/uL — AB (ref 3.87–5.11)
RDW: 15.4 % (ref 11.5–15.5)
WBC: 10.3 10*3/uL (ref 4.0–10.5)

## 2015-11-21 LAB — BASIC METABOLIC PANEL
ANION GAP: 14 (ref 5–15)
BUN: 5 mg/dL — ABNORMAL LOW (ref 6–20)
CALCIUM: 9.1 mg/dL (ref 8.9–10.3)
CO2: 23 mmol/L (ref 22–32)
Chloride: 100 mmol/L — ABNORMAL LOW (ref 101–111)
Creatinine, Ser: 0.74 mg/dL (ref 0.44–1.00)
Glucose, Bld: 143 mg/dL — ABNORMAL HIGH (ref 65–99)
POTASSIUM: 4.1 mmol/L (ref 3.5–5.1)
SODIUM: 137 mmol/L (ref 135–145)

## 2015-11-21 MED ORDER — METOPROLOL TARTRATE 25 MG PO TABS: 25.0000 mg | ORAL_TABLET | Freq: Two times a day (BID) | ORAL | Status: DC

## 2015-11-21 MED ORDER — HYDROCORTISONE 2.5 % RE CREA: TOPICAL_CREAM | Freq: Two times a day (BID) | RECTAL | Status: DC

## 2015-11-21 MED ORDER — DM-GUAIFENESIN ER 30-600 MG PO TB12: 1.0000 | ORAL_TABLET | Freq: Two times a day (BID) | ORAL | Status: DC | PRN

## 2015-11-21 MED ORDER — TRAMADOL HCL 50 MG PO TABS: 50.0000 mg | ORAL_TABLET | Freq: Three times a day (TID) | ORAL | Status: DC | PRN

## 2015-11-21 MED ORDER — RIVAROXABAN 15 MG PO TABS: 15.0000 mg | ORAL_TABLET | Freq: Two times a day (BID) | ORAL | Status: DC

## 2015-11-21 MED ORDER — LEVOFLOXACIN 750 MG PO TABS: 750.0000 mg | ORAL_TABLET | Freq: Every day | ORAL | Status: DC

## 2015-11-21 MED ORDER — DOCUSATE SODIUM 100 MG PO CAPS: 100.0000 mg | ORAL_CAPSULE | Freq: Two times a day (BID) | ORAL | Status: DC | PRN

## 2015-11-21 MED ORDER — RIVAROXABAN 20 MG PO TABS: 20.0000 mg | ORAL_TABLET | Freq: Every day | ORAL | Status: DC

## 2015-11-21 MED ORDER — ASPIRIN 81 MG PO TBEC: 81.0000 mg | DELAYED_RELEASE_TABLET | Freq: Every day | ORAL | Status: DC

## 2015-11-21 MED ORDER — FERROUS SULFATE 325 (65 FE) MG PO TABS: 325.0000 mg | ORAL_TABLET | Freq: Two times a day (BID) | ORAL | Status: DC

## 2015-11-21 NOTE — Progress Notes (Signed)
Patientwas discharge to home at 11:00 am  accompanied by family members and staff. Prescriptions , discharge instructions and education given. Patient verbalized understanding. All personal belongings given. Telemetry box and IV removed prior to discharge and site in good condition.

## 2015-11-21 NOTE — Discharge Summary (Signed)
Physician Discharge Summary   Patient ID: Brenda Cruz MRN: [id number] DOB/AGE: 48-Oct-1967 48 y.o.  Admit date: 11/15/2015 Discharge date: 11/21/2015  Primary Care Physician:  Pcp Not In System  Discharge Diagnoses:    Acute respiratory failure secondary to HCAP, pulmonary embolism resolved  . Sepsis ([place]) secondary to  . HCAP (healthcare-associated pneumonia) Left lower extremity DVT, presumed bilateral pulmonary embolism   Coronary artery disease status post CABG . AKI (acute kidney injury) ([place]) . GERD (gastroesophageal reflux disease) . Depression . GAD (generalized anxiety disorder) . Essential hypertension . obesity ([place])    Consults: Hematology  Recommendations for Outpatient Follow-up:  1. Patient was recommended to continue xarelto 4m BID x 21 days, then 20 mg daily thereafter. 2. Please repeat CBC/BMET at next visit   DIET: Heart healthy diet    Allergies: Atorvastatin, iohexol, PCN, Zithromax, sulfa antibiotics      DISCHARGE MEDICATIONS: Current Discharge Medication List    START taking these medications   Details  dextromethorphan-guaiFENesin (MUCINEX DM) 30-600 MG 12hr tablet Take 1 tablet by mouth 2 (two) times daily as needed for cough. Qty: 60 tablet, Refills: 0    docusate sodium (COLACE) 100 MG capsule Take 1 capsule (100 mg total) by mouth 2 (two) times daily as needed for mild constipation. Qty: 60 capsule, Refills: 0    ferrous sulfate 325 (65 FE) MG tablet Take 1 tablet (325 mg total) by mouth 2 (two) times daily with a meal. Qty: 60 tablet, Refills: 3    hydrocortisone (ANUSOL-HC) 2.5 % rectal cream Place rectally 2 (two) times daily. Qty: 30 g, Refills: 2    levofloxacin (LEVAQUIN) 750 MG tablet Take 1 tablet (750 mg total) by mouth daily. X 7days Qty: 7 tablet, Refills: 0    !! Rivaroxaban (XARELTO) 15 MG TABS tablet Take 1 tablet (15 mg total) by mouth 2 (two) times daily with a meal. X 20 more days Qty: 42 tablet, Refills: 0     !! rivaroxaban (XARELTO) 20 MG TABS tablet Take 1 tablet (20 mg total) by mouth daily with supper. Qty: 30 tablet, Refills: 3     !! - Potential duplicate medications found. Please discuss with provider.    CONTINUE these medications which have CHANGED   Details  aspirin EC 81 MG EC tablet Take 1 tablet (81 mg total) by mouth daily. Qty: 30 tablet, Refills: 3    metoprolol tartrate (LOPRESSOR) 25 MG tablet Take 1 tablet (25 mg total) by mouth 2 (two) times daily. Qty: 60 tablet, Refills: 3    traMADol (ULTRAM) 50 MG tablet Take 1 tablet (50 mg total) by mouth every 8 (eight) hours as needed for moderate pain. Qty: 60 tablet, Refills: 0      CONTINUE these medications which have NOT CHANGED   Details  acetaminophen (TYLENOL) 500 MG tablet Take 1,000 mg by mouth every 6 (six) hours as needed for moderate pain.    albuterol (PROVENTIL HFA;VENTOLIN HFA) 108 (90 BASE) MCG/ACT inhaler Inhale 2 puffs into the lungs every 6 (six) hours as needed for wheezing or shortness of breath. Qty: 1 Inhaler, Refills: 2   Associated Diagnoses: Other acute sinusitis    esomeprazole (NEXIUM) 40 MG capsule TAKE ONE (1) CAPSULE EACH DAY Qty: 30 capsule, Refills: 4   Associated Diagnoses: Gastroesophageal reflux disease without esophagitis    FLUoxetine (PROZAC) 20 MG capsule TAKE ONE (1) CAPSULE EACH DAY Qty: 30 capsule, Refills: 2   Associated Diagnoses: Depression    lisinopril (PRINIVIL,ZESTRIL) 5  MG tablet Take 1 tablet (5 mg total) by mouth daily. Qty: 30 tablet, Refills: 3    LORazepam (ATIVAN) 1 MG tablet TAKE 1 TABLET TWICE DAILY AS NEEDED FOR ANXIETY Qty: 60 tablet, Refills: 1   Associated Diagnoses: GAD (generalized anxiety disorder)    oxyCODONE (OXY IR/ROXICODONE) 5 MG immediate release tablet Take 1-2 tablets (5-10 mg total) by mouth every 3 (three) hours as needed for severe pain. Qty: 30 tablet, Refills: 0    simethicone (MYLICON) 245 MG chewable tablet Chew 125-250 mg by mouth  every 6 (six) hours as needed for flatulence.    atorvastatin (LIPITOR) 80 MG tablet Take 1 tablet (80 mg total) by mouth daily at 6 PM. Qty: 30 tablet, Refills: 3      STOP taking these medications     furosemide (LASIX) 40 MG tablet      potassium chloride SA (K-DUR,KLOR-CON) 20 MEQ tablet          Brief H and P: For complete details please refer to admission H and P, but in briefHPI on 11/15/2015 by Dr. Berneda Rose is a 50 y.o. female with PMH of CAD, s/p of CABG 11/02/15 by Dr. Roxan Hockey, hyperlipidemia, asthma, GERD, depression, anxiety, DDD, TMG, who presented with right sided flank pain and chest pain, shortness of breath and fever. Patient was recently hospitalized from 01/12-1/20/17 and had CABG done by Dr. Roxan Hockey on 11/02/15. Patient was doing fine until the day before the admission, when she started having shortness and right flank pain and R lower chest pain, severe, constant, sharp, pleuritic. Patient also reported mild cough with small amount dark colored sputum production. Denied any fevers at home but was found to have temp of 101.2 in the emergency room. No chills. Patient reported that she had tenderness over left calf area two days ago prior to admission, per her report had resolved. In ED, patient was found to have WBC 21.7, lactate 1.83-->3.44, negative troponin, negative pregnancy test, temperature 11.2, tachycardia, tachypnea, acute renal injury. CT-chest showed bilateral lower lobe atelectasis versus infiltrates and small right pleural effusion, but no evidence of mediastinal hematoma or evidence of pericardial effusion. Patient is admitted to inpatient for further eval and treatment. EDP consulted on-call cardiothoracic surgeon.   Hospital Course:  Acute hypoxic respiratory failure secondary to possible HCAP vs PE: Presenting with pleuritic chest pain, tachycardia and tachypnea. -Improving, home O2 evaluation done, patient maintained sats at 98% on  rest and ambulation on room air.  -CT chest without contrast showed bilateral lower lobe atelectasis versus infiltrate and small right pleural effusion -VQ scan obtained as patient has allergy to contrast dye: intermediate probability for PE -LE doppler: +DVT left distal femoral, popliteal, posterior tibial veins -Echocardiogram: EF 80-99%, grade 1 diastolic dysfunction -Influenza negative -PT and OT recommended home health PT -Discontinued IV heparin and transitioned to Xarelto; however, in light of patient's thrombocytopenia, xarelto was discontinued. Patient was 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 Patient was followed closely by hematology, see below, now transitioned back to xarelto.  Sepsis possibly secondary to pneumonia - Discussed in detail with Dr Alvy Bimler, possibility of bone marrow suppression with vancomycin and cefepime as patient's hemoglobin is trending down. No obvious bleeding. - Discontinued IV vancomycin and cefepime, Continue oral levofloxacin to complete full course of antibiotics - Blood cultures show no growth to date -Strep pneumonia urine Ag negative, urine Legionella antigen negative - Home O2 evaluation done, patient does not need O2.   Left lower extremity  DVT +/- bilateral pulmonary embolism  - AntithrombIII 96, Protein C activity 112, Protein C 75, Protein S Activity 66, Protein S Ag 116 - PTT lupus AC 110 (high), DRVVT 82 (high) - Factor 5 negative and prothrombin gene mutation negative - Patient was placed on  Arixtra as HIT antibody panel was positive. - Dr. Alvy Bimler met with the patient and her husband regarding options about anticoagulation. Patient and her husband prefer Xarelto at this time. Xarelto was restarted on 11/20/15. Hematology follow-up with Dr Alvy Bimler on 2/20.  Coronary artery disease -Status post CABG on 11/02/2015 -Cardiothoracic surgery made aware of patient's admission -Troponin mildly elevated however flat -Continue aspirin, statin, beta blocker   -Patient refused statin at last discharge  Essential hypertension - Lisinopril initially held due to acute renal injury, patient may restart it, creatinine improved to 0.74 -Continue metoprolol  Acute kidney injury- resolved -Likely secondary to sepsis and dehydration versus medication  GERD -Continue PPI  Depression and anxiety -Continue Prozac and Ativan  Chronic normocytic anemia -Baseline hemoglobin approximately 10 -Anemia panel: Iron 7, Ferritin 281 -Pending FOBT -Given dose of feraheme -Started on oral iron supplementation  - Patient was transfused 2 units packed RBCs on 11/19/15  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 -[place] were >300 10/29/15, and have steadily dropped, now PLT improving   Hyperkalemia -Resolved, continue to monitor BMP   Day of Discharge BP 145/83 mmHg  Pulse 99  Temp(Src) 98.2 F (36.8 C) (Oral)  Resp 18  Ht _0  (1.727 m)  Wt 97.16 kg (214 lb 3.2 oz)  BMI 32.58 kg/m2  SpO2 98%  LMP 11/01/2015 (Approximate)  Physical Exam: General: Alert and awake oriented x3 not in any acute distress. HEENT: anicteric sclera, pupils reactive to light and accommodation CVS: S1-S2 clear no murmur rubs or gallops Chest: clear to auscultation bilaterally, no wheezing rales or rhonchi, CABG scar healing Abdomen: soft nontender, nondistended, normal bowel sounds Extremities: no cyanosis, clubbing or edema noted bilaterally Neuro: Cranial nerves II-XII intact, no focal neurological deficits   The results of significant diagnostics from this hospitalization (including imaging, microbiology, ancillary and laboratory) are listed below for reference.    LAB RESULTS: Basic Metabolic Panel:  Recent Labs Lab 11/20/15 0400 11/21/15 0302  NA 140 137  K 3.4* 4.1  CL 103 100*  CO2 27 23  GLUCOSE 174* 143*  BUN <5* 5*  CREATININE 0.70 0.74  CALCIUM 8.5* 9.1   Liver Function Tests:  Recent  Labs Lab 11/15/15 1836  AST 24  ALT 17  ALKPHOS 75  BILITOT 0.9  PROT 7.9  ALBUMIN 3.5   No results for input(s): LIPASE, AMYLASE in the last 168 hours. No results for input(s): AMMONIA in the last 168 hours. CBC:  Recent Labs Lab 11/15/15 1836  11/20/15 0400 11/21/15 0302  WBC 21.7*  < > 9.0 10.3  NEUTROABS 17.8*  --   --   --   HGB 10.8*  < > 9.1* 9.6*  HCT 34.0*  < > 27.9* 28.8*  MCV 90.4  < > 86.6 88.1  PLT 208  < > 182 173  < > = values in this interval not displayed. Cardiac Enzymes:  Recent Labs Lab 11/16/15 0506 11/16/15 0918  TROPONINI 0.05* 0.04*   BNP: Invalid input(s): POCBNP CBG: No results for input(s): GLUCAP in the last 168 hours.  Significant Diagnostic Studies:  Dg Chest 2 View  11/15/2015  CLINICAL DATA:  Right-sided chest tightness  and shortness of breath EXAM: CHEST  2 VIEW COMPARISON:  11/04/2015 FINDINGS: Streaky and linear opacity in the lung bases is probably related atelectasis although pneumonia medial lung bases cannot be entirely excluded. Opacity seen posteriorly in the costophrenic sulcus on the lateral film. Lung volumes are low. Patient is status post median sternotomy. The visualized bony structures of the thorax are intact. IMPRESSION: Interval decrease in volume with interval development of bibasilar collapse/ consolidation. Electronically Signed   By: Misty Stanley M.D.   On: 11/15/2015 17:50   Ct Chest Wo Contrast  11/15/2015  CLINICAL DATA:  Shortness of breath. Right-sided back pain. Pneumonia. One week postop from CABG. EXAM: CT CHEST WITHOUT CONTRAST TECHNIQUE: Multidetector CT imaging of the chest was performed following the standard protocol without IV contrast. COMPARISON:  10/13/2011 FINDINGS: Mediastinum/Lymph Nodes: No masses or pathologically enlarged lymph nodes identified on this un-enhanced exam. No evidence of mediastinal hematoma. Recent postop changes from CABG noted. No evidence of pericardial effusion. Lungs/Pleura:  Bilateral lower lobe atelectasis or infiltrates are seen. Small right pleural effusion noted. No evidence of pneumothorax. Upper abdomen: No acute findings. Musculoskeletal: No chest wall mass or suspicious bone lesions identified. IMPRESSION: Bilateral lower lobe atelectasis versus infiltrates and small right pleural effusion. Electronically Signed   By: Earle Gell M.D.   On: 11/15/2015 20:31   Nm Pulmonary Perf And Vent  11/16/2015  CLINICAL DATA:  Shortness of breath.  Pleuritic chest pain. EXAM: NUCLEAR MEDICINE VENTILATION - PERFUSION LUNG SCAN TECHNIQUE: Ventilation images were obtained in multiple projections using inhaled aerosol Tc-21mDTPA. Perfusion images were obtained in multiple projections after intravenous injection of Tc-924mAA. RADIOPHARMACEUTICALS:  31 millicurie [id number] aerosol inhalation and 4.1 millicurie [id number] IV COMPARISON:  Chest x-ray and CT from earlier today FINDINGS: There is a moderate sized area of left upper lobe posterior segment matched defect. There is a large peripheral area of right lower lobe basilar segment perfusion defect which is triple matched (see RPO). IMPRESSION: Intermediate probability category (20-79%) by PIOPED II. Electronically Signed   By: JoMonte Fantasia.D.   On: 11/16/2015 00:41    2D ECHO:  Study Conclusions  - Left ventricle: The cavity size was normal. Wall thickness was increased (posterior wall) in a pattern of mild LVH. Systolic function was normal. The estimated ejection fraction was in the range of 60% to 65%. Wall motion was normal; there were no regional wall motion abnormalities. Doppler parameters are consistent with abnormal left ventricular relaxation (grade 1 diastolic dysfunction). - Right ventricle: The cavity size was normal. Wall thickness was normal.  Disposition and Follow-up: Discharge Instructions    Diet - low sodium heart healthy    Complete by:  As directed       Discharge instructions    Complete by:  As directed   Take XARELTO 1555mwice a day for 90m67moays.  Start Xarelto 90mg72mly with supper starting 12/12/15     Increase activity slowly    Complete by:  As directed             DISPOSITION: home    DISCHARGE FOLLOW-UP Follow-up Information    [facility] On 12/07/2015.   Specialty:  Hematology and Oncology   Why:  for hospital follow-up please confirm the time & date   Contact information:   [phone]  Follow up with SteveMelrose NakayamaOn 12/01/2015.  Specialty:  Cardiothoracic Surgery   Why:  at 9:45AM   Contact information:   [address] [place] [id number] [phone]        Time spent on Discharge: 61mns   Signed:   RAI,RIPUDEEP M.D. [facility] 11/21/2015, 11:16 AM Pager: [phone]

## 2015-11-23 ENCOUNTER — Other Ambulatory Visit: Payer: Self-pay | Admitting: Hematology and Oncology

## 2015-11-23 ENCOUNTER — Encounter: Payer: Self-pay | Admitting: Hematology and Oncology

## 2015-11-23 DIAGNOSIS — D509 Iron deficiency anemia, unspecified: Secondary | ICD-10-CM | POA: Insufficient documentation

## 2015-11-23 HISTORY — DX: Iron deficiency anemia, unspecified: D50.9

## 2015-11-23 LAB — SEROTONIN RELEASE ASSAY (SRA)
SRA .2 IU/mL UFH Ser-aCnc: 90 % — ABNORMAL HIGH (ref 0–20)
SRA 100IU/mL UFH Ser-aCnc: 26 % — ABNORMAL HIGH (ref 0–20)

## 2015-11-30 ENCOUNTER — Other Ambulatory Visit: Payer: Self-pay | Admitting: *Deleted

## 2015-11-30 DIAGNOSIS — Z951 Presence of aortocoronary bypass graft: Secondary | ICD-10-CM

## 2015-11-30 DIAGNOSIS — I2510 Atherosclerotic heart disease of native coronary artery without angina pectoris: Secondary | ICD-10-CM

## 2015-12-01 ENCOUNTER — Ambulatory Visit
Admission: RE | Admit: 2015-12-01 | Discharge: 2015-12-01 | Disposition: A | Discharge status: Home / Self Care | Payer: Managed Care, Other (non HMO) | Source: Ambulatory Visit | Attending: Thoracic Surgery (Cardiothoracic Vascular Surgery) | Admitting: Thoracic Surgery (Cardiothoracic Vascular Surgery)

## 2015-12-01 ENCOUNTER — Ambulatory Visit (INDEPENDENT_AMBULATORY_CARE_PROVIDER_SITE_OTHER): Payer: Self-pay | Admitting: Thoracic Surgery (Cardiothoracic Vascular Surgery)

## 2015-12-01 ENCOUNTER — Encounter: Payer: Self-pay | Admitting: Thoracic Surgery (Cardiothoracic Vascular Surgery)

## 2015-12-01 VITALS — BP 100/68 | HR 69 | Resp 16 | Ht 68.0 in | Wt 202.0 lb

## 2015-12-01 DIAGNOSIS — Z951 Presence of aortocoronary bypass graft: Secondary | ICD-10-CM

## 2015-12-01 DIAGNOSIS — I2510 Atherosclerotic heart disease of native coronary artery without angina pectoris: Secondary | ICD-10-CM

## 2015-12-01 NOTE — Progress Notes (Signed)
[address]renda Cruz [address]       [place] [address]             [phone]       HPI: Brenda Cruz presents for a scheduled postoperative follow-up visit.  Brenda Cruz is a 50 year old woman who underwent coronary bypass grafting 1 with a left mammary to LAD for left main disease on 11/02/2015. Her initial postoperative course was unremarkable and she went home on postoperative day #4.  About a week after discharge she started having problems and presented back to the emergency room. She was diagnosed with DVT and PE. She also was treated for presumed pneumonia. 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 She was discharged on Xarelto.  Since then she's been making progress. She does still have some incisional pain but is only taking Tylenol for that. She does not like the way oxycodone makes her feel. Her pleuritic pain has improved. She has not had any anginal-type chest pain.  Past Medical History  Diagnosis Date  . Asthma   . GERD (gastroesophageal reflux disease)   . DDD (degenerative disc disease)   . Hyperlipemia   . Anxiety   . Hemorrhoids   . TMJ (temporomandibular joint disorder)   . Iron deficiency anemia 11/23/2015   Past Surgical History  Procedure Laterality Date  . Cholecystectomy    . Lumbar fusion    . Cardiac catheterization N/A 10/29/2015    Procedure: Left Heart Cath and Coronary Angiography;  Surgeon: [name], MD;  Location: [facility];  Service: Cardiovascular;  Laterality: N/A;  . Coronary artery bypass graft N/A 11/02/2015    Procedure: CORONARY ARTERY BYPASS GRAFTING (CABG)x 1 using left internal mammary artery.;  Surgeon: Melrose Nakayama, MD;  Location: [facility];  Service: Open Heart Surgery;  Laterality: N/A;  . Tee without cardioversion N/A 11/02/2015    Procedure: TRANSESOPHAGEAL ECHOCARDIOGRAM (TEE);  Surgeon: Melrose Nakayama, MD;  Location: [facility];  Service: Open Heart Surgery;  Laterality: N/A;       Current Outpatient Prescriptions  Medication Sig Dispense Refill  . acetaminophen (TYLENOL) 500 MG tablet Take 1,000 mg by mouth every 6 (six) hours as needed for moderate pain.    Marland Kitchen albuterol (PROVENTIL HFA;VENTOLIN HFA) 108 (90 BASE) MCG/ACT inhaler Inhale 2 puffs into the lungs every 6 (six) hours as needed for wheezing or shortness of breath. 1 Inhaler 2  . aspirin EC 81 MG EC tablet Take 1 tablet (81 mg total) by mouth daily. 30 tablet 3  . esomeprazole (NEXIUM) 40 MG capsule TAKE ONE (1) CAPSULE EACH DAY 30 capsule 4  . ferrous sulfate 325 (65 FE) MG tablet Take 1 tablet (325 mg total) by mouth 2 (two) times daily with a meal. 60 tablet 3  . FLUoxetine (PROZAC) 20 MG capsule TAKE ONE (1) CAPSULE EACH DAY 30 capsule 2  . lisinopril (PRINIVIL,ZESTRIL) 5 MG tablet Take 1 tablet (5 mg total) by mouth daily. 30 tablet 3  . LORazepam (ATIVAN) 1 MG tablet TAKE 1 TABLET TWICE DAILY AS NEEDED FOR ANXIETY 60 tablet 1  . metoprolol tartrate (LOPRESSOR) 25 MG tablet Take 1 tablet (25 mg total) by mouth 2 (two) times daily. 60 tablet 3  . Rivaroxaban (XARELTO) 15 MG TABS tablet Take 1 tablet (15 mg total) by mouth 2 (two) times daily with a meal. X 20 more days 42 tablet 0  . traMADol (ULTRAM) 50 MG tablet Take 1 tablet (50 mg  total) by mouth every 8 (eight) hours as needed for moderate pain. 60 tablet 0  . [START ON 12/12/2015] rivaroxaban (XARELTO) 20 MG TABS tablet Take 1 tablet (20 mg total) by mouth daily with supper. (Patient not taking: Reported on 12/01/2015) 30 tablet 3   No current facility-administered medications for this visit.    Physical Exam BP 100/68 mmHg  Pulse 69  Resp 16  Ht 5\' 8"  (1.727 m)  Wt 202 lb (91.627 kg)  BMI 30.72 kg/m2  SpO2 98%  LMP 11/01/2015 (Approximate) 50 year old woman in no acute distress Alert and oriented 3 with no focal deficits Well-developed well-nourished Cardiac regular rate and rhythm normal S1 and S2 Lungs clear with equal breath sounds  bilaterally Sternum stable, incision healing well No peripheral edema  Diagnostic Tests: CHEST 2 VIEW  COMPARISON: CT chest of 11/15/2015 and chest x-ray of the same day  FINDINGS: Aeration of the lower lobes has improved somewhat although there is persistent bibasilar linear atelectasis left-greater-than-right present. No definite pleural effusion is seen. Mediastinal and hilar contours are unremarkable. Heart size is stable.  IMPRESSION: Improved aeration with persistent bibasilar linear atelectasis.   Electronically Signed  By: Ivar Drape M.D.  On: 12/01/2015 09:25  Impression: 50 year old woman who presented with exertional chest pain. Workup showed a tight ostial left main stenosis. She was treated with a left mammary to LAD on 11/02/2015. Her initial postoperative course was uncomplicated and she went home on day 4, however she was readmitted with pulmonary emboli shortly thereafter. She was diagnosed with heparin-induced thrombocytopenia. She currently is on Xarelto.  She is doing well from a surgical standpoint now that her to use her being treated. She does still have some incisional pain, but is only taking acetaminophen for that. She has had some limitations on her walking due to having pulmonary emboli, but is starting to increase her activities as well.  From my standpoint she is okay to drive once she feels comfortable with that. She should still not lift anything over 10 pounds for another 2 weeks or anything over 20 pounds for 4 weeks.  She missed her appointment with Dr. Gwenlyn Found due to being hospitalized. We will help her arrange an appointment with him.  Plan:  Follow-up appointment with Dr. Quay Burow.  I will be happy to see her back any time the future be of any further assistance with her care.  Melrose Nakayama, MD [facility] [phone]

## 2015-12-07 ENCOUNTER — Ambulatory Visit (HOSPITAL_BASED_OUTPATIENT_CLINIC_OR_DEPARTMENT_OTHER): Payer: Managed Care, Other (non HMO) | Admitting: Hematology and Oncology

## 2015-12-07 ENCOUNTER — Other Ambulatory Visit (HOSPITAL_BASED_OUTPATIENT_CLINIC_OR_DEPARTMENT_OTHER): Payer: Managed Care, Other (non HMO)

## 2015-12-07 ENCOUNTER — Encounter: Payer: Self-pay | Admitting: Hematology and Oncology

## 2015-12-07 VITALS — BP 135/77 | HR 74 | Temp 98.1°F | Resp 18 | Ht 68.0 in | Wt 207.4 lb

## 2015-12-07 DIAGNOSIS — D7582 Heparin induced thrombocytopenia (HIT): Secondary | ICD-10-CM | POA: Diagnosis not present

## 2015-12-07 DIAGNOSIS — D509 Iron deficiency anemia, unspecified: Secondary | ICD-10-CM

## 2015-12-07 DIAGNOSIS — I2699 Other pulmonary embolism without acute cor pulmonale: Secondary | ICD-10-CM

## 2015-12-07 DIAGNOSIS — D75829 Heparin-induced thrombocytopenia, unspecified: Secondary | ICD-10-CM

## 2015-12-07 DIAGNOSIS — R079 Chest pain, unspecified: Secondary | ICD-10-CM

## 2015-12-07 DIAGNOSIS — Z86711 Personal history of pulmonary embolism: Secondary | ICD-10-CM | POA: Insufficient documentation

## 2015-12-07 HISTORY — DX: Other pulmonary embolism without acute cor pulmonale: I26.99

## 2015-12-07 LAB — CBC & DIFF AND RETIC
BASO%: 0.6 % (ref 0.0–2.0)
Basophils Absolute: 0.1 10*3/uL (ref 0.0–0.1)
EOS ABS: 0.1 10*3/uL (ref 0.0–0.5)
EOS%: 1.5 % (ref 0.0–7.0)
HEMATOCRIT: 38.2 % (ref 34.8–46.6)
HGB: 12.4 g/dL (ref 11.6–15.9)
Immature Retic Fract: 11.3 % — ABNORMAL HIGH (ref 1.60–10.00)
LYMPH%: 28 % (ref 14.0–49.7)
MCH: 28.3 pg (ref 25.1–34.0)
MCHC: 32.5 g/dL (ref 31.5–36.0)
MCV: 87.2 fL (ref 79.5–101.0)
MONO#: 0.9 10*3/uL (ref 0.1–0.9)
MONO%: 10.6 % (ref 0.0–14.0)
NEUT%: 59.3 % (ref 38.4–76.8)
NEUTROS ABS: 5.1 10*3/uL (ref 1.5–6.5)
PLATELETS: 364 10*3/uL (ref 145–400)
RBC: 4.38 10*6/uL (ref 3.70–5.45)
RDW: 14.8 % — AB (ref 11.2–14.5)
RETIC %: 2.54 % — AB (ref 0.70–2.10)
Retic Ct Abs: 111.25 10*3/uL — ABNORMAL HIGH (ref 33.70–90.70)
WBC: 8.6 10*3/uL (ref 3.9–10.3)
lymph#: 2.4 10*3/uL (ref 0.9–3.3)

## 2015-12-07 MED ORDER — RIVAROXABAN 20 MG PO TABS: 20.0000 mg | ORAL_TABLET | Freq: Every day | ORAL | Status: DC

## 2015-12-08 DIAGNOSIS — D75829 Heparin-induced thrombocytopenia, unspecified: Secondary | ICD-10-CM | POA: Insufficient documentation

## 2015-12-08 DIAGNOSIS — D7582 Heparin induced thrombocytopenia (HIT): Secondary | ICD-10-CM | POA: Insufficient documentation

## 2015-12-08 NOTE — Progress Notes (Signed)
[facility] OFFICE PROGRESS NOTE  No primary care provider on file. SUMMARY OF HEMATOLOGIC HISTORY: The patient was seen in the hospital on 11/18/2015 Brenda Cruz 50 y.o. female was admitted after presentation with fever, dyspnea and new onset right pleuritic chest pain. She was just discharged from the hospital after CABG. Prior to admission, she complained of new onset of left lower extremity discomfort and right sided pleuritic chest pain. She quit smoking recently. On the day of admission she was found to have abnormal CBC. On 1/29, WBC was 21.7, hemoglobin was 10.8 and platelet count was 208. She had fever and multiple investigations On 11/15/15: CXR showed bibasilar consolidation. CT scan of chest without contrast showed evidence bilateral lobe atelectasis. She was placed on Iv antibiotics. On 11/16/15: VQ scan showed high probability of PE with LUL moderate defect and RLL defect with perfusion mismatched On 11/17/15: Left lower extremity Doppler was positive for acute DVT affecting left femoral, popliteal and posterior tibial veins On 11/17/15: ECHO showed normal EF without evidence of right heart strain She was started on heparin and had hypercoagulable panel drawn. Results is inconclusive for lupus anticoagulant  Since admission, her CBC changed. She has improvement of leukocytosis with antibiotic therapy. She got progressively anemic and was found to have iron deficiency anemia. She received IV iron.  Platelet count dropped to 125 today and was stopped due to suspected HIT. She was started on Xarelto   In term of history of blood clot, She was recently hospitalized from 1/12 to 1/20 for near syncopal episode and was later found to have severe disease in LAD, s/p CABG with LIMA to LAD on 11/02/15. Her operative record showed she was on bypass machine for 53 minutes. Her platelet count preoperatively was 254. By the day of discharge her platelet count was 175.  She had no  prior history or diagnosis of cancer. Her age appropriate screening programs are up-to-date. She had prior surgeries before and never had perioperative thromboembolic events. The patient had been exposed to birth control pills and fertility treatment and never had thrombotic events. The patient had been pregnant before and denies history of peripartum thromboembolic event or history of recurrent miscarriages. There is no family history of blood clots or miscarriages.  In terms of anemia, she had iron deficiency anemia during her pregnancy and had taken iron supplement before. She has history of menorrhagia. She had donated blood once but never received blood transfusion to her knowledge The patient was admitted from 11/15/2015 to 11/21/2015 for management of heparin-induced thrombocytopenia, confirmed on HITpanel/SRA and was discharged home on Xarelto She has discontinued smoking INTERVAL HISTORY: Brenda Cruz 50 y.o. female returns for further follow-up. She is doing well. Denies any chest pain or shortness of breath. She is able to tolerate oral iron supplements well, twice a day The patient denies any recent signs or symptoms of bleeding such as spontaneous epistaxis, hematuria or hematochezia. She complained of recent menorrhagia  I have reviewed the past medical history, past surgical history, social history and family history with the patient and they are unchanged from previous note.  ALLERGIES:  is allergic to atorvastatin; iohexol; heparin; penicillins; sulfa antibiotics; and zithromax.  MEDICATIONS:  Current Outpatient Prescriptions  Medication Sig Dispense Refill  . acetaminophen (TYLENOL) 500 MG tablet Take 1,000 mg by mouth every 6 (six) hours as needed for moderate pain.    Marland Kitchen aspirin EC 81 MG EC tablet Take 1 tablet (81 mg total) by mouth  daily. 30 tablet 3  . esomeprazole (NEXIUM) 40 MG capsule TAKE ONE (1) CAPSULE EACH DAY 30 capsule 4  . ferrous sulfate 325 (65 FE) MG  tablet Take 1 tablet (325 mg total) by mouth 2 (two) times daily with a meal. 60 tablet 3  . FLUoxetine (PROZAC) 20 MG capsule TAKE ONE (1) CAPSULE EACH DAY 30 capsule 2  . lisinopril (PRINIVIL,ZESTRIL) 5 MG tablet Take 1 tablet (5 mg total) by mouth daily. 30 tablet 3  . LORazepam (ATIVAN) 1 MG tablet TAKE 1 TABLET TWICE DAILY AS NEEDED FOR ANXIETY 60 tablet 1  . metoprolol tartrate (LOPRESSOR) 25 MG tablet Take 1 tablet (25 mg total) by mouth 2 (two) times daily. 60 tablet 3  . traMADol (ULTRAM) 50 MG tablet Take 1 tablet (50 mg total) by mouth every 8 (eight) hours as needed for moderate pain. 60 tablet 0  . albuterol (PROVENTIL HFA;VENTOLIN HFA) 108 (90 BASE) MCG/ACT inhaler Inhale 2 puffs into the lungs every 6 (six) hours as needed for wheezing or shortness of breath. (Patient not taking: Reported on 12/07/2015) 1 Inhaler 2  . rivaroxaban (XARELTO) 20 MG TABS tablet Take 1 tablet (20 mg total) by mouth daily with supper. 30 tablet 6   No current facility-administered medications for this visit.     REVIEW OF SYSTEMS:   Constitutional: Denies fevers, chills or night sweats Eyes: Denies blurriness of vision Ears, nose, mouth, throat, and face: Denies mucositis or sore throat Respiratory: Denies cough, dyspnea or wheezes Cardiovascular: Denies palpitation, chest discomfort or lower extremity swelling Gastrointestinal:  Denies nausea, heartburn or change in bowel habits Skin: Denies abnormal skin rashes Lymphatics: Denies new lymphadenopathy or easy bruising Neurological:Denies numbness, tingling or new weaknesses Behavioral/Psych: Mood is stable, no new changes  All other systems were reviewed with the patient and are negative.  PHYSICAL EXAMINATION: ECOG PERFORMANCE STATUS: 0 - Asymptomatic  Filed Vitals:   12/07/15 1101  BP: 135/77  Pulse: 74  Temp: 98.1 F (36.7 C)  Resp: 18   Filed Weights   12/07/15 1101  Weight: 207 lb 6.4 oz (94.076 kg)    GENERAL:alert, no distress  and comfortable. She is obese SKIN: skin color, texture, turgor are normal, no rashes or significant lesions EYES: normal, Conjunctiva are pink and non-injected, sclera clear Musculoskeletal:no cyanosis of digits and no clubbing  NEURO: alert & oriented x 3 with fluent speech, no focal motor/sensory deficits  LABORATORY DATA:  I have reviewed the data as listed Results for orders placed or performed in visit on 12/07/15 (from the past 48 hour(s))  CBC & Diff and Retic     Status: Abnormal   Collection Time: 12/07/15 10:29 AM  Result Value Ref Range   WBC 8.6 3.9 - 10.3 10e3/uL   NEUT# 5.1 1.5 - 6.5 10e3/uL   HGB 12.4 11.6 - 15.9 g/dL   HCT 38.2 34.8 - 46.6 %   Platelets 364 145 - 400 10e3/uL   MCV 87.2 79.5 - 101.0 fL   MCH 28.3 25.1 - 34.0 pg   MCHC 32.5 31.5 - 36.0 g/dL   RBC 4.38 3.70 - 5.45 10e6/uL   RDW 14.8 (H) 11.2 - 14.5 %   lymph# 2.4 0.9 - 3.3 10e3/uL   MONO# 0.9 0.1 - 0.9 10e3/uL   Eosinophils Absolute 0.1 0.0 - 0.5 10e3/uL   Basophils Absolute 0.1 0.0 - 0.1 10e3/uL   NEUT% 59.3 38.4 - 76.8 %   LYMPH% 28.0 14.0 - 49.7 %   MONO%  10.6 0.0 - 14.0 %   EOS% 1.5 0.0 - 7.0 %   BASO% 0.6 0.0 - 2.0 %   Retic % 2.54 (H) 0.70 - 2.10 %   Retic Ct Abs 111.25 (H) 33.70 - 90.70 10e3/uL   Immature Retic Fract 11.30 (H) 1.60 - 10.00 %    Lab Results  Component Value Date   WBC 8.6 12/07/2015   HGB 12.4 12/07/2015   HCT 38.2 12/07/2015   MCV 87.2 12/07/2015   PLT 364 12/07/2015   ASSESSMENT & PLAN:  Pulmonary emboli (HCC) The patient had probable pulmonary emboli, related to post operative course and heparin-induced thrombocytopenia/thrombosis. She is doing well on anticoagulation therapy except for recent mild menorrhagia I recommend she continue Xarelto for minimum 6 months until July 2017 At that point in time, I will call and check on the patient If she feels well and is relatively active/mobile, we will discontinue Xarelto and keep her on increased dose of aspirin  therapy indefinitely  Iron deficiency anemia Her anemia has resolved. I recommend she reduce oral iron supplement to once a day. Due to recent menorrhagia, I recommend she continue on oral iron supplement for at least 6 months  HIT (heparin-induced thrombocytopenia) (HCC) We discussed avoidance of heparin containing products in the future.    All questions were answered. The patient knows to call the clinic with any problems, questions or concerns. No barriers to learning was detected.  I spent 15 minutes counseling the patient face to face. The total time spent in the appointment was 20 minutes and more than 50% was on counseling.     [facility], Carla Rashad, MD 2/21/20178:22 AM

## 2015-12-08 NOTE — Assessment & Plan Note (Signed)
We discussed avoidance of heparin containing products in the future.

## 2015-12-08 NOTE — Assessment & Plan Note (Signed)
Her anemia has resolved. I recommend she reduce oral iron supplement to once a day. Due to recent menorrhagia, I recommend she continue on oral iron supplement for at least 6 months

## 2015-12-08 NOTE — Assessment & Plan Note (Signed)
The patient had probable pulmonary emboli, related to post operative course and heparin-induced thrombocytopenia/thrombosis. She is doing well on anticoagulation therapy except for recent mild menorrhagia I recommend she continue Xarelto for minimum 6 months until July 2017 At that point in time, I will call and check on the patient If she feels well and is relatively active/mobile, we will discontinue Xarelto and keep her on increased dose of aspirin therapy indefinitely

## 2015-12-11 ENCOUNTER — Telehealth: Payer: Self-pay | Admitting: Cardiology

## 2015-12-11 ENCOUNTER — Ambulatory Visit (INDEPENDENT_AMBULATORY_CARE_PROVIDER_SITE_OTHER): Payer: Managed Care, Other (non HMO) | Admitting: Cardiology

## 2015-12-11 ENCOUNTER — Encounter: Payer: Self-pay | Admitting: Cardiology

## 2015-12-11 VITALS — BP 122/84 | HR 58 | Ht 68.0 in | Wt 207.4 lb

## 2015-12-11 DIAGNOSIS — D75829 Heparin-induced thrombocytopenia, unspecified: Secondary | ICD-10-CM

## 2015-12-11 DIAGNOSIS — I2699 Other pulmonary embolism without acute cor pulmonale: Secondary | ICD-10-CM

## 2015-12-11 DIAGNOSIS — T50905A Adverse effect of unspecified drugs, medicaments and biological substances, initial encounter: Secondary | ICD-10-CM

## 2015-12-11 DIAGNOSIS — M6282 Rhabdomyolysis: Secondary | ICD-10-CM | POA: Diagnosis not present

## 2015-12-11 DIAGNOSIS — T887XXA Unspecified adverse effect of drug or medicament, initial encounter: Secondary | ICD-10-CM | POA: Diagnosis not present

## 2015-12-11 DIAGNOSIS — Z951 Presence of aortocoronary bypass graft: Secondary | ICD-10-CM

## 2015-12-11 DIAGNOSIS — Z79899 Other long term (current) drug therapy: Secondary | ICD-10-CM | POA: Diagnosis not present

## 2015-12-11 DIAGNOSIS — I10 Essential (primary) hypertension: Secondary | ICD-10-CM

## 2015-12-11 DIAGNOSIS — J189 Pneumonia, unspecified organism: Secondary | ICD-10-CM

## 2015-12-11 DIAGNOSIS — E785 Hyperlipidemia, unspecified: Secondary | ICD-10-CM | POA: Insufficient documentation

## 2015-12-11 DIAGNOSIS — D7582 Heparin induced thrombocytopenia (HIT): Secondary | ICD-10-CM

## 2015-12-11 MED ORDER — ROSUVASTATIN CALCIUM 10 MG PO TABS: ORAL_TABLET | ORAL | Status: DC

## 2015-12-11 NOTE — Assessment & Plan Note (Signed)
Family history of rhabdomyolysis from statin Rx

## 2015-12-11 NOTE — Telephone Encounter (Signed)
Returning your call. °

## 2015-12-11 NOTE — Assessment & Plan Note (Signed)
PE, DVT post CABG

## 2015-12-11 NOTE — Telephone Encounter (Signed)
Spoke with patient - she would like to proceed with a referral to [place] cardiac rehab. Referral placed in EPIC

## 2015-12-11 NOTE — Assessment & Plan Note (Signed)
Controlled.  

## 2015-12-11 NOTE — Telephone Encounter (Signed)
Patient seen by Lurena Joiner, [place] today Wants to start cardiac rehab asap as is going back to work 4/11 Called [facility] - earliest start date is April 4 Called [facility]hab - no wait list for start date   Cardiac rehab sessions are MWF, last apprxo 1 hour 15 minutes First session 645am Last session 2:45pm  LM for patient to call back to see if she wants a referral to [facility] cardiac rehab instead of waiting on [facility]

## 2015-12-11 NOTE — Progress Notes (Signed)
12/11/2015 Brenda Cruz   October 06, 1966  [id number]  Primary Physician No primary care provider on file. 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 Cath revealed 80% LM. She underwent elective CABG x 1 with an LIMA-LAD 11/02/15. She did well initially post op but came back to the hospital with SOB, sepsis. She was found to have HCAP. She had slight renal insufficiency and a VQ was done that was intermediate for PE. LE dopplers positive for LLE DVT. She was placed on Heparin but developed HIT. Eventually she stabilized and was discharged on Xarelto. She has an apt with Hematology Monday but the plan is for 6 months of anticoagulation. She is in the office today for follow up. She is not on a statin. She admits she has been prescribed statins in the past but has never taken them. She says her father had rhabdomyolysis from Zocor and was quite ill.and she has always been afraid to take one. Her other concern is the delay in starting Cardiac Rehab in [place]. They can't start her till the end of March and she goes back to work the first of April.   Current Outpatient Prescriptions  Medication Sig Dispense Refill  . acetaminophen (TYLENOL) 500 MG tablet Take 1,000 mg by mouth every 6 (six) hours as needed for moderate pain.    Marland Kitchen albuterol (PROVENTIL HFA;VENTOLIN HFA) 108 (90 BASE) MCG/ACT inhaler Inhale 2 puffs into the lungs every 6 (six) hours as needed for wheezing or shortness of breath. 1 Inhaler 2  . aspirin EC 81 MG EC tablet Take 1 tablet (81 mg total) by mouth daily. 30 tablet 3  . esomeprazole (NEXIUM) 40 MG capsule TAKE ONE (1) CAPSULE EACH DAY 30 capsule 4  . ferrous sulfate 325 (65 FE) MG tablet Take 1 tablet (325 mg total) by mouth 2 (two) times daily with a meal. 60 tablet 3  . FLUoxetine (PROZAC) 20 MG capsule TAKE ONE (1) CAPSULE EACH DAY 30 capsule 2  .  lisinopril (PRINIVIL,ZESTRIL) 5 MG tablet Take 1 tablet (5 mg total) by mouth daily. 30 tablet 3  . LORazepam (ATIVAN) 1 MG tablet TAKE 1 TABLET TWICE DAILY AS NEEDED FOR ANXIETY 60 tablet 1  . metoprolol tartrate (LOPRESSOR) 25 MG tablet Take 1 tablet (25 mg total) by mouth 2 (two) times daily. 60 tablet 3  . rivaroxaban (XARELTO) 20 MG TABS tablet Take 1 tablet (20 mg total) by mouth daily with supper. 30 tablet 6  . traMADol (ULTRAM) 50 MG tablet Take 1 tablet (50 mg total) by mouth every 8 (eight) hours as needed for moderate pain. 60 tablet 0  . rosuvastatin (CRESTOR) 10 MG tablet Take 1 tablet (10mg ) by mouth on Monday, Wednesday, Friday. 30 tablet 3   No current facility-administered medications for this visit.    Allergies  Allergen Reactions  . Atorvastatin Other (See Comments)    myalgias  . Iohexol Itching, Rash and Other (See Comments)     Code: RASH, Desc: White blisters in mouth during ivp in [facility] '93, ok w/ 13 hour prep today//a.calhoun, Onset Date: [id number]   . 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 Penicillins Other (See Comments)    Has patient had a PCN reaction causing immediate rash, facial/tongue/throat swelling, SOB or lightheadedness with hypotension: No Has patient had a PCN reaction causing severe rash involving  mucus membranes or skin necrosis: No Has patient had a PCN reaction that required hospitalization No Has patient had a PCN reaction occurring within the last 10 years: No If all of the above answers are "NO", then may proceed with Cephalosporin use.  . Sulfa Antibiotics Itching and Rash  . Zithromax [Azithromycin] Itching and Rash    Social History   Social History  . Marital Status: Married    Spouse Name: N/A  . Number of Children: 1  . Years of Education: N/A   Occupational History  . Medical Billing    Social History Main Topics  . Smoking status: Former Research scientist (life sciences)  . Smokeless tobacco: Never Used     Comment:  Counseling paper for smoking given to patient in exam room   . Alcohol Use: No  . Drug Use: No  . Sexual Activity: Not on file   Other Topics Concern  . Not on file   Social History Narrative   2 caffeine drinks daily      Review of Systems: General: negative for chills, fever, night sweats or weight changes.  Cardiovascular: negative for chest pain, dyspnea on exertion, edema, orthopnea, palpitations, paroxysmal nocturnal dyspnea or shortness of breath Dermatological: negative for rash Respiratory: negative for cough or wheezing Urologic: negative for hematuria Abdominal: negative for nausea, vomiting, diarrhea, bright red blood per rectum, melena, or hematemesis Neurologic: negative for visual changes, syncope, or dizziness All other systems reviewed and are otherwise negative except as noted above.    Blood pressure 122/84, pulse 58, height 5\' 8"  (1.727 m), weight 207 lb 6.4 oz (94.076 kg), last menstrual period 11/01/2015.  General appearance: alert, cooperative, no distress and mildly obese Neck: no carotid bruit and no JVD Lungs: clear to auscultation bilaterally and decreased Rt base, no rub Heart: regular rate and rhythm, S1, S2 normal, no murmur, click, rub or gallop Extremities: extremities normal, atraumatic, no cyanosis or edema Neurologic: Grossly normal  EKG NSR, SB  ASSESSMENT AND PLAN:   S/P CABG x 1 LIMA-LAD 11/02/15  Pulmonary emboli (HCC) PE, DVT post CABG  HIT (heparin-induced thrombocytopenia) (HCC) HIT with Heparin during her adm for PE/DVT/ HCAP  Essential hypertension Controlled  Dyslipidemia Family history of rhabdomyolysis from statin Rx  HCAP (healthcare-associated pneumonia) Admitted 11/15/15    PLAN  Discussed with Dr Claiborne Billings- will start Crestor 10 mg MWF. Check CK and LFTs in 4 weeks. If she is doing well increase Crestor to 10 mg QD and check Lipids and LFTs in 2 months. F/U Dr Gwenlyn Found 3 months. We'll try and get Cardiac Rehab moved up.  She can't come to [facility] for this- an hour drive one way.   Kerin Ransom K PA-C 12/11/2015 9:57 AM

## 2015-12-11 NOTE — Assessment & Plan Note (Signed)
LIMA-LAD 11/02/15

## 2015-12-11 NOTE — Assessment & Plan Note (Signed)
Admitted 11/15/15

## 2015-12-11 NOTE — Patient Instructions (Signed)
Your physician has recommended you make the following change in your medication: START crestor 10mg  once daily on Monday, Wednesday, Friday   Your physician recommends that you return for lab work in 4 weeks  Your physician recommends that you schedule a follow-up appointment in: 2 months with Dr. Gwenlyn Found

## 2015-12-11 NOTE — Assessment & Plan Note (Signed)
HIT with Heparin during her adm for PE/DVT/ HCAP

## 2015-12-22 ENCOUNTER — Other Ambulatory Visit: Payer: Self-pay | Admitting: Nurse Practitioner

## 2015-12-23 NOTE — Telephone Encounter (Signed)
Last filled 11/16/15, last seen 10/16/15. [facility]

## 2015-12-24 ENCOUNTER — Ambulatory Visit (HOSPITAL_COMMUNITY): Payer: Managed Care, Other (non HMO)

## 2015-12-24 NOTE — Telephone Encounter (Signed)
Please call in ativan with 1 refills 

## 2015-12-24 NOTE — Telephone Encounter (Signed)
rx called in

## 2015-12-29 ENCOUNTER — Ambulatory Visit (HOSPITAL_COMMUNITY): Payer: Managed Care, Other (non HMO)

## 2015-12-30 ENCOUNTER — Ambulatory Visit (HOSPITAL_COMMUNITY): Payer: Managed Care, Other (non HMO)

## 2016-01-01 ENCOUNTER — Ambulatory Visit (HOSPITAL_COMMUNITY): Payer: Managed Care, Other (non HMO)

## 2016-01-04 ENCOUNTER — Ambulatory Visit (HOSPITAL_COMMUNITY): Payer: Managed Care, Other (non HMO)

## 2016-01-06 ENCOUNTER — Ambulatory Visit (HOSPITAL_COMMUNITY): Payer: Managed Care, Other (non HMO)

## 2016-01-07 ENCOUNTER — Other Ambulatory Visit: Payer: Self-pay | Admitting: Nurse Practitioner

## 2016-01-08 ENCOUNTER — Ambulatory Visit (HOSPITAL_COMMUNITY): Payer: Managed Care, Other (non HMO)

## 2016-01-11 ENCOUNTER — Ambulatory Visit (HOSPITAL_COMMUNITY): Payer: Managed Care, Other (non HMO)

## 2016-01-13 ENCOUNTER — Ambulatory Visit (HOSPITAL_COMMUNITY): Payer: Managed Care, Other (non HMO)

## 2016-01-15 ENCOUNTER — Ambulatory Visit (HOSPITAL_COMMUNITY): Payer: Managed Care, Other (non HMO)

## 2016-01-18 ENCOUNTER — Ambulatory Visit (HOSPITAL_COMMUNITY): Payer: Managed Care, Other (non HMO)

## 2016-01-20 ENCOUNTER — Ambulatory Visit (HOSPITAL_COMMUNITY): Payer: Managed Care, Other (non HMO)

## 2016-01-22 ENCOUNTER — Ambulatory Visit (HOSPITAL_COMMUNITY): Payer: Managed Care, Other (non HMO)

## 2016-01-25 ENCOUNTER — Ambulatory Visit (HOSPITAL_COMMUNITY): Payer: Managed Care, Other (non HMO)

## 2016-01-27 ENCOUNTER — Ambulatory Visit (HOSPITAL_COMMUNITY): Payer: Managed Care, Other (non HMO)

## 2016-01-29 ENCOUNTER — Ambulatory Visit (HOSPITAL_COMMUNITY): Payer: Managed Care, Other (non HMO)

## 2016-02-01 ENCOUNTER — Encounter: Payer: Self-pay | Admitting: Nurse Practitioner

## 2016-02-01 ENCOUNTER — Ambulatory Visit (HOSPITAL_COMMUNITY): Payer: Managed Care, Other (non HMO)

## 2016-02-01 ENCOUNTER — Telehealth: Payer: Self-pay | Admitting: Cardiovascular Disease

## 2016-02-01 ENCOUNTER — Ambulatory Visit (INDEPENDENT_AMBULATORY_CARE_PROVIDER_SITE_OTHER): Payer: Managed Care, Other (non HMO) | Admitting: Nurse Practitioner

## 2016-02-01 VITALS — BP 118/75 | HR 78 | Temp 97.8°F | Ht 68.0 in | Wt 210.0 lb

## 2016-02-01 DIAGNOSIS — I2699 Other pulmonary embolism without acute cor pulmonale: Secondary | ICD-10-CM

## 2016-02-01 DIAGNOSIS — F329 Major depressive disorder, single episode, unspecified: Secondary | ICD-10-CM

## 2016-02-01 DIAGNOSIS — F411 Generalized anxiety disorder: Secondary | ICD-10-CM

## 2016-02-01 DIAGNOSIS — F32A Depression, unspecified: Secondary | ICD-10-CM

## 2016-02-01 DIAGNOSIS — R079 Chest pain, unspecified: Secondary | ICD-10-CM | POA: Diagnosis not present

## 2016-02-01 DIAGNOSIS — I2510 Atherosclerotic heart disease of native coronary artery without angina pectoris: Secondary | ICD-10-CM

## 2016-02-01 DIAGNOSIS — E785 Hyperlipidemia, unspecified: Secondary | ICD-10-CM | POA: Diagnosis not present

## 2016-02-01 MED ORDER — FLUOXETINE HCL 40 MG PO CAPS: 40.0000 mg | ORAL_CAPSULE | Freq: Every day | ORAL | Status: DC

## 2016-02-01 MED ORDER — ROSUVASTATIN CALCIUM 10 MG PO TABS: 10.0000 mg | ORAL_TABLET | Freq: Every day | ORAL | Status: DC

## 2016-02-01 MED ORDER — CLONAZEPAM 0.5 MG PO TABS: 0.5000 mg | ORAL_TABLET | Freq: Two times a day (BID) | ORAL | Status: DC | PRN

## 2016-02-01 NOTE — Patient Instructions (Signed)
Stress and Stress Management Stress is a normal reaction to life events. It is what you feel when life demands more than you are used to or more than you can handle. Some stress can be useful. For example, the stress reaction can help you catch the last bus of the day, study for a test, or meet a deadline at work. But stress that occurs too often or for too long can cause problems. It can affect your emotional health and interfere with relationships and normal daily activities. Too much stress can weaken your immune system and increase your risk for physical illness. If you already have a medical problem, stress can make it worse. CAUSES  All sorts of life events may cause stress. An event that causes stress for one person may not be stressful for another person. Major life events commonly cause stress. These may be positive or negative. Examples include losing your job, moving into a new home, getting married, having a baby, or losing a loved one. Less obvious life events may also cause stress, especially if they occur day after day or in combination. Examples include working long hours, driving in traffic, caring for children, being in debt, or being in a difficult relationship. SIGNS AND SYMPTOMS Stress may cause emotional symptoms including, the following:  Anxiety. This is feeling worried, afraid, on edge, overwhelmed, or out of control.  Anger. This is feeling irritated or impatient.  Depression. This is feeling sad, down, helpless, or guilty.  Difficulty focusing, remembering, or making decisions. Stress may cause physical symptoms, including the following:   Aches and pains. These may affect your head, neck, back, stomach, or other areas of your body.  Tight muscles or clenched jaw.  Low energy or trouble sleeping. Stress may cause unhealthy behaviors, including the following:   Eating to feel better (overeating) or skipping meals.  Sleeping too little, too much, or both.  Working  too much or putting off tasks (procrastination).  Smoking, drinking alcohol, or using drugs to feel better. DIAGNOSIS  Stress is diagnosed through an assessment by your health care provider. Your health care provider will ask questions about your symptoms and any stressful life events.Your health care provider will also ask about your medical history and may order blood tests or other tests. Certain medical conditions and medicine can cause physical symptoms similar to stress. Mental illness can cause emotional symptoms and unhealthy behaviors similar to stress. Your health care provider may refer you to a mental health professional for further evaluation.  TREATMENT  Stress management is the recommended treatment for stress.The goals of stress management are reducing stressful life events and coping with stress in healthy ways.  Techniques for reducing stressful life events include the following:  Stress identification. Self-monitor for stress and identify what causes stress for you. These skills may help you to avoid some stressful events.  Time management. Set your priorities, keep a calendar of events, and learn to say "no." These tools can help you avoid making too many commitments. Techniques for coping with stress include the following:  Rethinking the problem. Try to think realistically about stressful events rather than ignoring them or overreacting. Try to find the positives in a stressful situation rather than focusing on the negatives.  Exercise. Physical exercise can release both physical and emotional tension. The key is to find a form of exercise you enjoy and do it regularly.  Relaxation techniques. These relax the body and mind. Examples include yoga, meditation, tai chi, biofeedback, deep  breathing, progressive muscle relaxation, listening to music, being out in nature, journaling, and other hobbies. Again, the key is to find one or more that you enjoy and can do  regularly.  Healthy lifestyle. Eat a balanced diet, get plenty of sleep, and do not smoke. Avoid using alcohol or drugs to relax.  Strong support network. Spend time with family, friends, or other people you enjoy being around.Express your feelings and talk things over with someone you trust. Counseling or talktherapy with a mental health professional may be helpful if you are having difficulty managing stress on your own. Medicine is typically not recommended for the treatment of stress.Talk to your health care provider if you think you need medicine for symptoms of stress. HOME CARE INSTRUCTIONS  Keep all follow-up visits as directed by your health care provider.  Take all medicines as directed by your health care provider. SEEK MEDICAL CARE IF:  Your symptoms get worse or you start having new symptoms.  You feel overwhelmed by your problems and can no longer manage them on your own. SEEK IMMEDIATE MEDICAL CARE IF:  You feel like hurting yourself or someone else.   This information is not intended to replace advice given to you by your health care provider. Make sure you discuss any questions you have with your health care provider.   Document Released: 03/29/2001 Document Revised: 10/24/2014 Document Reviewed: 05/28/2013 Elsevier Interactive Patient Education 2016 Elsevier Inc.  

## 2016-02-01 NOTE — Progress Notes (Signed)
   Subjective:    Patient ID: Brenda Cruz, female    DOB: 11-24-1965, 50 y.o.   MRN: [id number]  HPI Patient comes in today c/o chest pain earlier this morning. Felt like something squeezing her chest- somewhat different then it was when she had to go to hospital several months ago. Her blood pressure was elevated- she says that she is very stressed at work . SHe is very afraid since having bypass surgey- afraid she is going to die suddenly.  * she was seen with chest pain and SOB - she was sent to ER and was found to be having a MI and had immediate bypass surgery- since surgery she has had a PE and is currently on xeralto.    Review of Systems  Constitutional: Negative.   Respiratory: Negative.   Cardiovascular: Positive for chest pain.  Gastrointestinal: Negative.   Genitourinary: Negative.   Neurological: Negative.   Psychiatric/Behavioral: The patient is nervous/anxious.   All other systems reviewed and are negative.      Objective:   Physical Exam  Constitutional: She is oriented to person, place, and time. She appears well-developed and well-nourished. No distress.  Cardiovascular: Normal rate, regular rhythm and normal heart sounds.   Pulmonary/Chest: Effort normal and breath sounds normal.  Neurological: She is alert and oriented to person, place, and time.  Skin: Skin is warm.  Psychiatric: She has a normal mood and affect. Her behavior is normal. Judgment and thought content normal.   BP 118/75 mmHg  Pulse 78  Temp(Src) 97.8 F (36.6 C) (Oral)  Ht 5\' 8"  (1.727 m)  Wt 210 lb (95.255 kg)  BMI 31.94 kg/m2  EKG unchanged from Larae Grooms, [name]        Assessment & Plan:   1. Chest pain, unspecified chest pain type   2. Coronary artery disease involving native coronary artery of native heart without angina pectoris   3. Other acute pulmonary embolism ([place])   4. GAD (generalized anxiety disorder)   5. Depression   6. Hyperlipidemia with  target LDL less than 100    Increased prozac from 20mg  daily to 40mg  daily Changed lorazepam to klonoopin 0.5mg  BID stress mangement Work limited to 1/2 day x 1 week Follow up prn  [name] [name], [name]

## 2016-02-01 NOTE — Telephone Encounter (Signed)
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  she taking lisinopril 5 mg ,metoprolol 12.5 mg twice a day  Appointment schedule with extender - 02/02/16 at 9:30 am Patient able to come today

## 2016-02-01 NOTE — Telephone Encounter (Signed)
Pt went back to work 01-26-16, problem with endurance and BP high-would like to work half days for 2 weeks-Pt needs note -fax to [place] @ [phone] -pt was sent home today BP too high- pls call pt at [phone]

## 2016-02-02 ENCOUNTER — Ambulatory Visit: Payer: Managed Care, Other (non HMO) | Admitting: Nurse Practitioner

## 2016-02-03 ENCOUNTER — Ambulatory Visit (HOSPITAL_COMMUNITY): Payer: Managed Care, Other (non HMO)

## 2016-02-05 ENCOUNTER — Ambulatory Visit (HOSPITAL_COMMUNITY): Payer: Managed Care, Other (non HMO)

## 2016-02-08 ENCOUNTER — Ambulatory Visit (HOSPITAL_COMMUNITY): Payer: Managed Care, Other (non HMO)

## 2016-02-10 ENCOUNTER — Ambulatory Visit (HOSPITAL_COMMUNITY): Payer: Managed Care, Other (non HMO)

## 2016-02-12 ENCOUNTER — Ambulatory Visit (HOSPITAL_COMMUNITY): Payer: Managed Care, Other (non HMO)

## 2016-02-15 ENCOUNTER — Ambulatory Visit (HOSPITAL_COMMUNITY): Payer: Managed Care, Other (non HMO)

## 2016-02-17 ENCOUNTER — Ambulatory Visit (HOSPITAL_COMMUNITY): Payer: Managed Care, Other (non HMO)

## 2016-02-19 ENCOUNTER — Ambulatory Visit (HOSPITAL_COMMUNITY): Payer: Managed Care, Other (non HMO)

## 2016-02-22 ENCOUNTER — Ambulatory Visit (HOSPITAL_COMMUNITY): Payer: Managed Care, Other (non HMO)

## 2016-02-23 ENCOUNTER — Ambulatory Visit: Payer: Managed Care, Other (non HMO) | Admitting: Cardiovascular Disease

## 2016-02-24 ENCOUNTER — Ambulatory Visit (HOSPITAL_COMMUNITY): Payer: Managed Care, Other (non HMO)

## 2016-02-26 ENCOUNTER — Ambulatory Visit (HOSPITAL_COMMUNITY): Payer: Managed Care, Other (non HMO)

## 2016-02-29 ENCOUNTER — Ambulatory Visit (HOSPITAL_COMMUNITY): Payer: Managed Care, Other (non HMO)

## 2016-03-02 ENCOUNTER — Ambulatory Visit (HOSPITAL_COMMUNITY): Payer: Managed Care, Other (non HMO)

## 2016-03-03 ENCOUNTER — Other Ambulatory Visit: Payer: Self-pay | Admitting: Nurse Practitioner

## 2016-03-04 ENCOUNTER — Ambulatory Visit (HOSPITAL_COMMUNITY): Payer: Managed Care, Other (non HMO)

## 2016-03-07 ENCOUNTER — Ambulatory Visit (HOSPITAL_COMMUNITY): Payer: Managed Care, Other (non HMO)

## 2016-03-09 ENCOUNTER — Ambulatory Visit (HOSPITAL_COMMUNITY): Payer: Managed Care, Other (non HMO)

## 2016-03-11 ENCOUNTER — Ambulatory Visit (HOSPITAL_COMMUNITY): Payer: Managed Care, Other (non HMO)

## 2016-03-14 ENCOUNTER — Ambulatory Visit (HOSPITAL_COMMUNITY): Payer: Managed Care, Other (non HMO)

## 2016-03-15 ENCOUNTER — Ambulatory Visit (INDEPENDENT_AMBULATORY_CARE_PROVIDER_SITE_OTHER): Payer: Managed Care, Other (non HMO) | Admitting: Cardiovascular Disease

## 2016-03-15 ENCOUNTER — Encounter: Payer: Self-pay | Admitting: Cardiovascular Disease

## 2016-03-15 VITALS — BP 112/72 | HR 64 | Ht 68.0 in | Wt 214.6 lb

## 2016-03-15 DIAGNOSIS — I10 Essential (primary) hypertension: Secondary | ICD-10-CM

## 2016-03-15 DIAGNOSIS — Z79899 Other long term (current) drug therapy: Secondary | ICD-10-CM | POA: Diagnosis not present

## 2016-03-15 DIAGNOSIS — I2510 Atherosclerotic heart disease of native coronary artery without angina pectoris: Secondary | ICD-10-CM

## 2016-03-15 DIAGNOSIS — E785 Hyperlipidemia, unspecified: Secondary | ICD-10-CM

## 2016-03-15 DIAGNOSIS — Z0000 Encounter for general adult medical examination without abnormal findings: Secondary | ICD-10-CM | POA: Diagnosis not present

## 2016-03-15 MED ORDER — ROSUVASTATIN CALCIUM 20 MG PO TABS: 20.0000 mg | ORAL_TABLET | Freq: Every day | ORAL | Status: DC

## 2016-03-15 NOTE — Progress Notes (Signed)
03/15/2016 Brenda Cruz   Dec 14, 1965  [id number]  Primary Physician [name], [name] Primary Cardiologist: [name] MD FACP,FACC,FAHA, FSCAI   HPI:  .Brenda Cruz is a 50 y.o.married Caucasian female with a history of GERD, newly diagnosed HLD/ HTN, anxiety, morbid obesity, continued tobacco abuse ( 1.5 PPD) and no past cardiac history who presented to [facility] ED today (10/29/15) with chest pain/SOB.   She has been having substernal chest pain since November 2016 but worsening for the past 3 weeks. The chest pain is worse with minimal exertion and associated with SOB/nausea and relived at rest. Her chest pain is not reminiscent of her typical GERD symptoms. She was seen on 10/16/15 by her PCP for evaluation of chest pain. She was supposed to get a stress test, but this was never scheduled. She was newly started on a statin and lisinopril for elevated BPs in the office. However, she never started any of these medications because she "doesnt like taking pills." Additionally, her BP had normalized at home and she was afraid to take the medication for fear of hypotension. She routinely takes her BP at home and it seems to be quite labile.   She works at [employer] in [place] and has quite a bit of stress at work. This morning she became so SOB and had such severe chest pain that she felt like she might pass out. No syncope. She saw the PA at [facility] who advised that she present to the ED. She denies palpitations, LE edema, orthopnea or PND. She does admit to snoring at night but no witnessed apneic episodes. She does not formally exercise and is very sedentary. At the end of our discussion, her husband and she mentioned that this all started after starting her new position at [employer]. She does not have these symptoms on the weekends or at home. For instance, she walked up an incline in the snow last week and had minimal SOB with no chest pain. Additionally, she has  a long history of anxiety and recently started taking her prozac again as well as her ativan.   Of note, she does have a family hx of CAD. Her father died of a massive heart attack at 39. Her mother has 6 siblings and every one of them had CAD with events in their 66s. Her sister has difficult to control HTN and had a heart cath in her 42s, but no stenting to their knowledge. Because of her risk factors, the crescendo nature of her symptoms I decided to proceed with coronary angiography to define her anatomy and rule out an ischemic etiology. I performed carotid catheterization 10/29/15 revealing 80% distal left main stenosis.On 11/02/15 she underwent coronary artery bypass grafting X1 by Dr. Manuela Schwartz in with a LIMA to LAD. She did well postoperatively however she did come back one week later with a DVT, pulmonary embolus at community acquired pneumonia She has been oral anticoagulations since.   Current Outpatient Prescriptions  Medication Sig Dispense Refill  . acetaminophen (TYLENOL) 500 MG tablet Take 1,000 mg by mouth every 6 (six) hours as needed for moderate pain.    Marland Kitchen albuterol (PROVENTIL HFA;VENTOLIN HFA) 108 (90 BASE) MCG/ACT inhaler Inhale 2 puffs into the lungs every 6 (six) hours as needed for wheezing or shortness of breath. 1 Inhaler 2  . aspirin EC 81 MG EC tablet Take 1 tablet (81 mg total) by mouth daily. 30 tablet 3  . clonazePAM (KLONOPIN) 0.5 MG tablet  Take 1 tablet (0.5 mg total) by mouth 2 (two) times daily as needed for anxiety. 60 tablet 1  . esomeprazole (NEXIUM) 40 MG capsule TAKE ONE (1) CAPSULE EACH DAY 30 capsule 4  . ferrous sulfate 325 (65 FE) MG tablet Take 325 mg by mouth as directed.    Marland Kitchen FLUoxetine (PROZAC) 40 MG capsule Take 1 capsule (40 mg total) by mouth daily. 90 capsule 3  . lisinopril (PRINIVIL,ZESTRIL) 5 MG tablet TAKE ONE (1) TABLET EACH DAY 30 tablet 4  . LORazepam (ATIVAN) 1 MG tablet Take 1 tablet by mouth as directed.    . metoprolol tartrate  (LOPRESSOR) 25 MG tablet Take 1 tablet (25 mg total) by mouth 2 (two) times daily. 60 tablet 3  . rivaroxaban (XARELTO) 20 MG TABS tablet Take 1 tablet (20 mg total) by mouth daily with supper. 30 tablet 6  . traMADol (ULTRAM) 50 MG tablet Take 1 tablet (50 mg total) by mouth every 8 (eight) hours as needed for moderate pain. 60 tablet 0  . rosuvastatin (CRESTOR) 20 MG tablet Take 1 tablet (20 mg total) by mouth daily. 90 tablet 3   No current facility-administered medications for this visit.    Allergies  Allergen Reactions  . Atorvastatin Other (See Comments)    myalgias  . Iohexol Itching, Rash and Other (See Comments)     Code: RASH, Desc: White blisters in mouth during ivp in [facility] '93, ok w/ 13 hour prep today//a.calhoun, Onset Date: [id number]   . 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 Penicillins Other (See Comments)    Has patient had a PCN reaction causing immediate rash, facial/tongue/throat swelling, SOB or lightheadedness with hypotension: Yes Has patient had a PCN reaction causing severe rash involving mucus membranes or skin necrosis: Yes Has patient had a PCN reaction that required hospitalization Yes Has patient had a PCN reaction occurring within the last 10 years: Yes If all of the above answers are "NO", then may proceed with Cephalosporin use.  . Sulfa Antibiotics Itching and Rash  . Zithromax [Azithromycin] Itching and Rash    Social History   Social History  . Marital Status: Married    Spouse Name: N/A  . Number of Children: 1  . Years of Education: N/A   Occupational History  . Medical Billing    Social History Main Topics  . Smoking status: Former Research scientist (life sciences)  . Smokeless tobacco: Never Used     Comment: Counseling paper for smoking given to patient in exam room   . Alcohol Use: No  . Drug Use: No  . Sexual Activity: Not on file   Other Topics Concern  . Not on file   Social History Narrative   2 caffeine drinks daily       Review of Systems: General: negative for chills, fever, night sweats or weight changes.  Cardiovascular: negative for chest pain, dyspnea on exertion, edema, orthopnea, palpitations, paroxysmal nocturnal dyspnea or shortness of breath Dermatological: negative for rash Respiratory: negative for cough or wheezing Urologic: negative for hematuria Abdominal: negative for nausea, vomiting, diarrhea, bright red blood per rectum, melena, or hematemesis Neurologic: negative for visual changes, syncope, or dizziness All other systems reviewed and are otherwise negative except as noted above.    Blood pressure 112/72, pulse 64, height 5\' 8"  (1.727 m), weight 214 lb 9.6 oz (97.342 kg), SpO2 99 %.  General appearance: alert and no distress Neck: no adenopathy, no carotid bruit, no  JVD, supple, symmetrical, trachea midline and thyroid not enlarged, symmetric, no tenderness/mass/nodules Lungs: clear to auscultation bilaterally Heart: regular rate and rhythm, S1, S2 normal, no murmur, click, rub or gallop Extremities: extremities normal, atraumatic, no cyanosis or edema  EKG not performed today  ASSESSMENT AND PLAN:   Essential hypertension History of hypertension blood pressure measured at 112/72. She is on lisinopril and metoprolol. Continue current meds at current dosing  Coronary artery disease involving native coronary artery of native heart without angina pectoris History of CAD status post cardiac catheterization performed by myself 10/29/15 revealing high-grade left main stenosis. She underwent coronary artery bypass grafting X 1 with a LIMA to her LAD by Dr. Roxan Hockey on 11/02/15. She's had no chest pain since.  Dyslipidemia History of hyperlipidemia recent lipid profile performed 10/30/15 revealed a total cholesterol of 252, LDL 192 and HDL of 31. I'm going to begin her on Crestor 20 mg a day and will recheck a lipid liver profile in 2 months.  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 She has been on Xarelto oral anticoagulation since. We will discontinue this in August of this year.      [name] MD FACP,FACC,FAHA, [facility] 03/15/2016 4:08 PM

## 2016-03-15 NOTE — Patient Instructions (Signed)
Medication Instructions:  Your physician has recommended you make the following change in your medication:  1- INCREASE CRESTOR TO 20 MG (1 TABLET) BY MOUTH DAILY.   Labwork: Your physician recommends that you return for lab work in: [place]. The lab can be found on the FIRST FLOOR of out building in [address]   Testing/Procedures: NONE  Follow-Up: Your physician wants you to follow-up in: [place]. You will receive a reminder letter in the mail two months in advance. If you don't receive a letter, please call our office to schedule the follow-up appointment.   Any Other Special Instructions Will Be Listed Below (If Applicable).     If you need a refill on your cardiac medications before your next appointment, please call your pharmacy.

## 2016-03-15 NOTE — Assessment & Plan Note (Signed)
History of hypertension blood pressure measured at 112/72. She is on lisinopril and metoprolol. Continue current meds at current dosing

## 2016-03-15 NOTE — Assessment & Plan Note (Signed)
History of hyperlipidemia recent lipid profile performed 10/30/15 revealed a total cholesterol of 252, LDL 192 and HDL of 31. I'm going to begin her on Crestor 20 mg a day and will recheck a lipid liver profile in 2 months.

## 2016-03-15 NOTE — Assessment & Plan Note (Signed)
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 She has been on Xarelto oral anticoagulation since. We will discontinue this in August of this year.

## 2016-03-15 NOTE — Assessment & Plan Note (Signed)
History of CAD status post cardiac catheterization performed by myself 10/29/15 revealing high-grade left main stenosis. She underwent coronary artery bypass grafting X 1 with a LIMA to her LAD by Dr. Roxan Hockey on 11/02/15. She's had no chest pain since.

## 2016-03-16 ENCOUNTER — Ambulatory Visit (HOSPITAL_COMMUNITY): Payer: Managed Care, Other (non HMO)

## 2016-03-18 ENCOUNTER — Ambulatory Visit (HOSPITAL_COMMUNITY): Payer: Managed Care, Other (non HMO)

## 2016-03-21 ENCOUNTER — Ambulatory Visit (HOSPITAL_COMMUNITY): Payer: Managed Care, Other (non HMO)

## 2016-03-23 ENCOUNTER — Ambulatory Visit (HOSPITAL_COMMUNITY): Payer: Managed Care, Other (non HMO)

## 2016-03-25 ENCOUNTER — Ambulatory Visit (HOSPITAL_COMMUNITY): Payer: Managed Care, Other (non HMO)

## 2016-03-28 ENCOUNTER — Ambulatory Visit (HOSPITAL_COMMUNITY): Payer: Managed Care, Other (non HMO)

## 2016-03-30 ENCOUNTER — Ambulatory Visit (HOSPITAL_COMMUNITY): Payer: Managed Care, Other (non HMO)

## 2016-04-01 ENCOUNTER — Ambulatory Visit (HOSPITAL_COMMUNITY): Payer: Managed Care, Other (non HMO)

## 2016-04-04 ENCOUNTER — Ambulatory Visit (HOSPITAL_COMMUNITY): Payer: Managed Care, Other (non HMO)

## 2016-04-06 ENCOUNTER — Ambulatory Visit (HOSPITAL_COMMUNITY): Payer: Managed Care, Other (non HMO)

## 2016-04-08 ENCOUNTER — Ambulatory Visit (HOSPITAL_COMMUNITY): Payer: Managed Care, Other (non HMO)

## 2016-04-18 ENCOUNTER — Emergency Department (HOSPITAL_COMMUNITY): Payer: Managed Care, Other (non HMO)

## 2016-04-18 ENCOUNTER — Encounter (HOSPITAL_COMMUNITY): Payer: Self-pay | Admitting: Family Medicine

## 2016-04-18 ENCOUNTER — Emergency Department (HOSPITAL_COMMUNITY)
Admission: EM | Admit: 2016-04-18 | Discharge: 2016-04-19 | Disposition: A | Discharge status: Home / Self Care | Payer: Managed Care, Other (non HMO) | Attending: Emergency Medicine | Admitting: Emergency Medicine

## 2016-04-18 ENCOUNTER — Telehealth: Payer: Self-pay | Admitting: Cardiovascular Disease

## 2016-04-18 DIAGNOSIS — R072 Precordial pain: Secondary | ICD-10-CM | POA: Diagnosis present

## 2016-04-18 DIAGNOSIS — Z951 Presence of aortocoronary bypass graft: Secondary | ICD-10-CM | POA: Diagnosis not present

## 2016-04-18 DIAGNOSIS — Z79899 Other long term (current) drug therapy: Secondary | ICD-10-CM | POA: Diagnosis not present

## 2016-04-18 DIAGNOSIS — Z87891 Personal history of nicotine dependence: Secondary | ICD-10-CM | POA: Insufficient documentation

## 2016-04-18 DIAGNOSIS — Z7982 Long term (current) use of aspirin: Secondary | ICD-10-CM | POA: Insufficient documentation

## 2016-04-18 DIAGNOSIS — I10 Essential (primary) hypertension: Secondary | ICD-10-CM | POA: Insufficient documentation

## 2016-04-18 DIAGNOSIS — J40 Bronchitis, not specified as acute or chronic: Secondary | ICD-10-CM

## 2016-04-18 HISTORY — DX: Essential (primary) hypertension: I10

## 2016-04-18 LAB — CBC
HEMATOCRIT: 35.3 % — AB (ref 36.0–46.0)
Hemoglobin: 10.8 g/dL — ABNORMAL LOW (ref 12.0–15.0)
MCH: 27 pg (ref 26.0–34.0)
MCHC: 30.6 g/dL (ref 30.0–36.0)
MCV: 88.3 fL (ref 78.0–100.0)
Platelets: 300 10*3/uL (ref 150–400)
RBC: 4 MIL/uL (ref 3.87–5.11)
RDW: 14.2 % (ref 11.5–15.5)
WBC: 11 10*3/uL — AB (ref 4.0–10.5)

## 2016-04-18 LAB — BASIC METABOLIC PANEL
ANION GAP: 7 (ref 5–15)
BUN: 7 mg/dL (ref 6–20)
CO2: 22 mmol/L (ref 22–32)
Calcium: 8.9 mg/dL (ref 8.9–10.3)
Chloride: 106 mmol/L (ref 101–111)
Creatinine, Ser: 1.01 mg/dL — ABNORMAL HIGH (ref 0.44–1.00)
GFR calc Af Amer: >60 mL/min (ref >60)
Glucose, Bld: 119 mg/dL — ABNORMAL HIGH (ref 65–99)
POTASSIUM: 3.8 mmol/L (ref 3.5–5.1)
SODIUM: 135 mmol/L (ref 135–145)

## 2016-04-18 LAB — D-DIMER, QUANTITATIVE (NOT AT ARMC): D DIMER QUANT: 1.6 ug{FEU}/mL — AB (ref 0.00–0.50)

## 2016-04-18 LAB — I-STAT TROPONIN, ED
Troponin i, poc: 0 ng/mL (ref 0.00–0.08)
Troponin i, poc: 0 ng/mL (ref 0.00–0.08)

## 2016-04-18 LAB — RAPID STREP SCREEN (MED CTR MEBANE ONLY): STREPTOCOCCUS, GROUP A SCREEN (DIRECT): NEGATIVE

## 2016-04-18 MED ORDER — DIPHENHYDRAMINE HCL 50 MG/ML IJ SOLN
25.0000 mg | Freq: Once | INTRAMUSCULAR | Status: DC
  Filled 2016-04-18: qty 1

## 2016-04-18 MED ORDER — HYDROCORTISONE NA SUCCINATE PF 100 MG IJ SOLR
200.0000 mg | Freq: Once | INTRAMUSCULAR | Status: AC
  Administered 2016-04-18: 200 mg via INTRAVENOUS
  Filled 2016-04-18: qty 4

## 2016-04-18 MED ORDER — FENTANYL CITRATE (PF) 100 MCG/2ML IJ SOLN
100.0000 ug | Freq: Once | INTRAMUSCULAR | Status: AC
  Administered 2016-04-18: 100 ug via INTRAVENOUS
  Filled 2016-04-18: qty 2

## 2016-04-18 MED ORDER — METHYLPREDNISOLONE SODIUM SUCC 125 MG IJ SOLR
125.0000 mg | Freq: Once | INTRAMUSCULAR | Status: DC
  Filled 2016-04-18: qty 2

## 2016-04-18 MED ORDER — DIPHENHYDRAMINE HCL 50 MG/ML IJ SOLN
50.0000 mg | Freq: Once | INTRAMUSCULAR | Status: AC
  Administered 2016-04-18: 50 mg via INTRAVENOUS
  Filled 2016-04-18: qty 1

## 2016-04-18 NOTE — Telephone Encounter (Signed)
Pt calling regarding SOB -diminished sounds right lung -chest pressure-pain-hx blood clot

## 2016-04-18 NOTE — ED Notes (Signed)
Pt here for increased chest pain and SOB upon inspiration. sts hx of PE. sts started Saturday. Sts recent trip to beach. Pt on xarelto,.

## 2016-04-18 NOTE — ED Provider Notes (Signed)
CSN: [id number]     Arrival date & time 04/18/16  1521 History   First MD Initiated Contact with Patient 04/18/16 1755     Chief Complaint  Patient presents with  . Chest Pain  . Shortness of Breath     (Consider location/radiation/quality/duration/timing/severity/associated sxs/prior Treatment) Patient is a 50 y.o. female presenting with chest pain.  Chest Pain Pain location:  Substernal area and L chest Pain quality: sharp and stabbing   Pain radiates to:  Neck Pain radiates to the back: no   Pain severity:  Mild Onset quality:  Gradual Duration:  2 days Timing:  Constant Chronicity:  Recurrent Context: breathing and movement   Relieved by:  None tried Worsened by:  Nothing tried Ineffective treatments:  None tried Associated symptoms: no abdominal pain, no dizziness, no dysphagia, no fever, no orthopnea and no palpitations     Past Medical History  Diagnosis Date  . Asthma   . GERD (gastroesophageal reflux disease)   . DDD (degenerative disc disease)   . Hyperlipemia   . Anxiety   . Hemorrhoids   . TMJ (temporomandibular joint disorder)   . Iron deficiency anemia 11/23/2015  . Pulmonary emboli ([place]) 12/07/2015  . Hypertension    Past Surgical History  Procedure Laterality Date  . Cholecystectomy    . Lumbar fusion    . Cardiac catheterization N/A 10/29/2015    Procedure: Left Heart Cath and Coronary Angiography;  Surgeon: [name], MD;  Location: [facility] [facility];  Service: Cardiovascular;  Laterality: N/A;  . Coronary artery bypass graft N/A 11/02/2015    Procedure: CORONARY ARTERY BYPASS GRAFTING (CABG)x 1 using left internal mammary artery.;  Surgeon: Melrose Nakayama, MD;  Location: [facility];  Service: Open Heart Surgery;  Laterality: N/A;  . Tee without cardioversion N/A 11/02/2015    Procedure: TRANSESOPHAGEAL ECHOCARDIOGRAM (TEE);  Surgeon: Melrose Nakayama, MD;  Location: [facility];  Service: Open Heart Surgery;  Laterality: N/A;   Family History   Problem Relation Age of Onset  . Colon cancer Neg Hx   . Colon polyps Father   . Stomach cancer Paternal Grandmother   . Esophageal cancer Maternal Grandfather   . Breast cancer Mother   . Ovarian cancer      Maternal Side Great  Aunt   Social History  Substance Use Topics  . Smoking status: Former Research scientist (life sciences)  . Smokeless tobacco: Never Used     Comment: Counseling paper for smoking given to patient in exam room   . Alcohol Use: No   OB History    No data available     Review of Systems  Constitutional: Negative for fever and chills.  HENT: Negative for trouble swallowing.   Eyes: Negative for pain.  Cardiovascular: Positive for chest pain. Negative for palpitations and orthopnea.  Gastrointestinal: Negative for abdominal pain.  Endocrine: Negative for polydipsia and polyuria.  Neurological: Negative for dizziness.  All other systems reviewed and are negative.     Allergies  Atorvastatin; Iohexol; Heparin; Penicillins; Sulfa antibiotics; and Zithromax  Home Medications   Prior to Admission medications   Medication Sig Start Date End Date Taking? Authorizing Provider  acetaminophen (TYLENOL) 500 MG tablet Take 1,000 mg by mouth every 6 (six) hours as needed for moderate pain.   Yes Historical Provider, MD  aspirin EC 81 MG EC tablet Take 1 tablet (81 mg total) by mouth daily. 11/21/15  Yes Ripudeep Krystal Eaton, MD  clonazePAM (KLONOPIN) 0.5 MG tablet Take 1  tablet (0.5 mg total) by mouth 2 (two) times daily as needed for anxiety. Patient taking differently: Take 0.5 mg by mouth at bedtime.  02/01/16  Yes [name] [name], [name]  esomeprazole (NEXIUM) 40 MG capsule TAKE ONE (1) [place] Patient taking differently: Take 40 mg by mouth daily at 12 noon. TAKE ONE (1) CAPSULE EACH DAY 10/16/15  Yes [name] [name], [name]  ferrous sulfate 325 (65 FE) MG tablet Take 325 mg by mouth daily as needed (only takes for menses).    Yes Historical Provider, MD  FLUoxetine (PROZAC) 40  MG capsule Take 1 capsule (40 mg total) by mouth daily. 02/01/16  Yes [name] [name], [name]  lisinopril (PRINIVIL,ZESTRIL) 5 MG tablet TAKE ONE (1) TABLET EACH DAY 03/04/16  Yes [name] [name], [name]  LORazepam (ATIVAN) 1 MG tablet Take 1 tablet by mouth every 8 (eight) hours as needed for anxiety.  12/22/15  Yes Historical Provider, MD  metoprolol tartrate (LOPRESSOR) 25 MG tablet Take 1 tablet (25 mg total) by mouth 2 (two) times daily. 11/21/15  Yes Ripudeep Krystal Eaton, MD  Multiple Vitamin (MULTIVITAMIN WITH MINERALS) TABS tablet Take 1 tablet by mouth daily.   Yes Historical Provider, MD  Probiotic Product (RA PROBIOTIC GUMMIES) CHEW Chew 1 each by mouth daily.   Yes Historical Provider, MD  rivaroxaban (XARELTO) 20 MG TABS tablet Take 1 tablet (20 mg total) by mouth daily with supper. 12/07/15  Yes Heath Lark, MD  rosuvastatin (CRESTOR) 20 MG tablet Take 1 tablet (20 mg total) by mouth daily. Patient taking differently: Take 20 mg by mouth every evening.  03/15/16  Yes [name], MD  albuterol (PROVENTIL HFA;VENTOLIN HFA) 108 (90 Base) MCG/ACT inhaler Inhale 2 puffs into the lungs every 4 (four) hours as needed for wheezing or shortness of breath. 04/19/16   Merrily Pew, MD  doxycycline (VIBRAMYCIN) 100 MG capsule Take 1 capsule (100 mg total) by mouth 2 (two) times daily. One po bid x 7 days 04/19/16   Merrily Pew, MD  predniSONE (DELTASONE) 20 MG tablet 3 tabs po day one, then 2 po daily x 4 days 04/19/16   Merrily Pew, MD  traMADol (ULTRAM) 50 MG tablet Take 1 tablet (50 mg total) by mouth every 8 (eight) hours as needed for moderate pain. 11/21/15   Ripudeep K Rai, MD   BP 128/71 mmHg  Pulse 78  Temp(Src) 97.8 F (36.6 C) (Oral)  Resp 17  SpO2 98% Physical Exam  Constitutional: She is oriented to person, place, and time. She appears well-developed and well-nourished.  HENT:  Head: Normocephalic and atraumatic.  Neck: Normal range of motion.  Cardiovascular: Normal rate and regular  rhythm.   Pulmonary/Chest: Effort normal. No stridor. Tachypnea noted. No respiratory distress.  Abdominal: Soft. She exhibits no distension. There is no tenderness.  Musculoskeletal: She exhibits no edema or tenderness.  Neurological: She is alert and oriented to person, place, and time. No cranial nerve deficit.  Skin: Skin is warm and dry.  Nursing note and vitals reviewed.   ED Course  Procedures (including critical care time)  Angiocath insertion Performed by: Merrily Pew  Consent: Verbal consent obtained. Risks and benefits: risks, benefits and alternatives were discussed Time out: Immediately prior to procedure a "time out" was called to verify the correct patient, procedure, equipment, support staff and site/side marked as required.  Preparation: Patient was prepped and draped in the usual sterile fashion.  Vein Location: Right brachial  Ultrasound Guided  Gauge: 20  Normal  blood return and flush without difficulty Patient tolerance: Patient tolerated the procedure well with no immediate complications.     Labs Review Labs Reviewed  BASIC METABOLIC PANEL - Abnormal; Notable for the following:    Glucose, Bld 119 (*)    Creatinine, Ser 1.01 (*)    All other components within normal limits  CBC - Abnormal; Notable for the following:    WBC 11.0 (*)    Hemoglobin 10.8 (*)    HCT 35.3 (*)    All other components within normal limits  D-DIMER, QUANTITATIVE (NOT AT [facility]) - Abnormal; Notable for the following:    D-Dimer, Quant 1.60 (*)    All other components within normal limits  RAPID STREP SCREEN (NOT AT [facility])  CULTURE, GROUP A STREP ([place])  I-STAT TROPOININ, ED  I-STAT TROPOININ, ED    Imaging Review Ct Angio Chest Pe W/cm &/or Wo Cm  04/19/2016  CLINICAL DATA:  Mid chest pain beginning 3 days ago. History of pulmonary embolism, hypertension and hyperlipidemia. EXAM: CT ANGIOGRAPHY CHEST WITH CONTRAST TECHNIQUE: Multidetector CT imaging of the chest was  performed using the standard protocol during bolus administration of intravenous contrast. Multiplanar CT image reconstructions and MIPs were obtained to evaluate the vascular anatomy. CONTRAST:  50 cc Isovue 370 COMPARISON:  Chest radiograph October 20, 2015 CT chest November 15, 2015 FINDINGS: Mild respiratory motion degraded examination. PULMONARY ARTERY: Adequate contrast opacification of the pulmonary artery's. Main pulmonary artery is not enlarged. No pulmonary arterial filling defects to the level of the subsegmental branches. MEDIASTINUM: The heart size is mildly enlarged, no right heart strain. Mild pericardial wall thickening. Status post CABG. Thoracic aorta is normal course and caliber, unremarkable. No lymphadenopathy by CT size criteria. LUNGS: Tracheobronchial tree is patent, no pneumothorax. No pleural effusions, focal consolidations, pulmonary nodules or masses. 4 mm stable sub solid RIGHT middle lobe pulmonary nodule. Bibasilar linear densities. SOFT TISSUES AND OSSEOUS STRUCTURES: Included view of the abdomen is unremarkable. Status post median sternotomy with nonunited surgical defect. Review of the MIP images confirms the above findings. IMPRESSION: No acute pulmonary embolism on this respiratory motion degraded examination. Bibasilar atelectasis/scarring, improved aeration from prior CT. Mild cardiomegaly.  Status post CABG. Electronically Signed   By: Elon Alas M.D.   On: 04/19/2016 01:21   I have personally reviewed and evaluated these images and lab results as part of my medical decision-making.   EKG Interpretation   Date/Time:  Monday April 18 2016 15:26:55 EDT Ventricular Rate:  100 PR Interval:  138 QRS Duration: 74 QT Interval:  352 QTC Calculation: 454 R Axis:   8 Text Interpretation:  Normal sinus rhythm Nonspecific ST and T wave  abnormality Abnormal ECG No significant change since last tracing  Confirmed by [facility] MD, Corene Cornea [phone]) on 04/18/2016 6:19:29 PM       MDM   Final diagnoses:  Bronchitis   Likely bronchitis, h/o PE, recent trip, pleuritic pain and wasn't taking xarelto so will cta. Has h/o iodine allergy so will do four hour prep to get ct scan.  CT negative for PE. Symptoms improved. Likely bronchitis and will treat for the same.   New Prescriptions: Discharge Medication List as of 04/19/2016  1:28 AM    START taking these medications   Details  doxycycline (VIBRAMYCIN) 100 MG capsule Take 1 capsule (100 mg total) by mouth 2 (two) times daily. One po bid x 7 days, Starting 04/19/2016, Until Discontinued, Print    predniSONE (DELTASONE) 20 MG tablet  3 tabs po day one, then 2 po daily x 4 days, Print        I have personally and contemperaneously reviewed labs and imaging and used in my decision making as above.   A medical screening exam was performed and I feel the patient has had an appropriate workup for their chief complaint at this time and likelihood of emergent condition existing is low and thus workup can continue on an outpatient basis.. Their vital signs are stable. They have been counseled on decision, discharge, follow up and which symptoms necessitate immediate return to the emergency department.  They verbally stated understanding and agreement with plan and discharged in stable condition.      Merrily Pew, MD 04/20/16 [phone]

## 2016-04-18 NOTE — Telephone Encounter (Signed)
Agree .. Thanks.  Dr. Lemmie Evens

## 2016-04-18 NOTE — Telephone Encounter (Signed)
Returned call. Patient states positive for symptoms of SOB, chest pain, especially noticed on deep inspiration but states "hurts every time I breath in". History significant for DVT, PE. She reports compliance w Xarelto and no missed doses of medication. She reports symptoms are consistent w her previous PE. I advised due to the chest pain being active at this time and mirroring previous history, best recommendation would be assessment in ED. Pt states she is in [place] and will have driver take her to [facility]. Notified patient I would contact our hospital cardmaster Wannetta Sender). Pt voiced understanding and thanks.  Left msg for Trish regarding pending patient arrival.

## 2016-04-19 ENCOUNTER — Emergency Department (HOSPITAL_COMMUNITY): Payer: Managed Care, Other (non HMO)

## 2016-04-19 ENCOUNTER — Encounter (HOSPITAL_COMMUNITY): Payer: Self-pay | Admitting: Radiology

## 2016-04-19 MED ORDER — IOPAMIDOL (ISOVUE-370) INJECTION 76%
INTRAVENOUS | Status: AC
  Administered 2016-04-19: 50 mL
  Filled 2016-04-19: qty 100

## 2016-04-19 MED ORDER — ALBUTEROL SULFATE HFA 108 (90 BASE) MCG/ACT IN AERS: 2.0000 | INHALATION_SPRAY | RESPIRATORY_TRACT | Status: DC | PRN

## 2016-04-19 MED ORDER — DOXYCYCLINE HYCLATE 100 MG PO CAPS: 100.0000 mg | ORAL_CAPSULE | Freq: Two times a day (BID) | ORAL | Status: DC

## 2016-04-19 MED ORDER — PREDNISONE 20 MG PO TABS: ORAL_TABLET | ORAL | Status: DC

## 2016-04-19 NOTE — ED Notes (Signed)
Pt stable, ambulatory, states understanding of discharge instructions 

## 2016-04-21 LAB — CULTURE, GROUP A STREP (THRC)

## 2016-05-05 ENCOUNTER — Other Ambulatory Visit: Payer: Self-pay | Admitting: *Deleted

## 2016-05-05 DIAGNOSIS — F411 Generalized anxiety disorder: Secondary | ICD-10-CM

## 2016-05-05 MED ORDER — CLONAZEPAM 0.5 MG PO TABS: 0.5000 mg | ORAL_TABLET | Freq: Two times a day (BID) | ORAL | Status: DC | PRN

## 2016-05-05 NOTE — Telephone Encounter (Signed)
Last seen and filled 02/01/16, change to call in

## 2016-05-05 NOTE — Telephone Encounter (Signed)
Please call in klonopin with 0 refills 

## 2016-05-05 NOTE — Telephone Encounter (Signed)
Rx called in and pt is aware. 

## 2016-05-24 ENCOUNTER — Other Ambulatory Visit: Payer: Self-pay | Admitting: Cardiovascular Disease

## 2016-05-31 ENCOUNTER — Telehealth: Payer: Self-pay | Admitting: Hematology and Oncology

## 2016-05-31 ENCOUNTER — Other Ambulatory Visit: Payer: Self-pay | Admitting: Hematology and Oncology

## 2016-05-31 DIAGNOSIS — Z1239 Encounter for other screening for malignant neoplasm of breast: Secondary | ICD-10-CM

## 2016-05-31 NOTE — Telephone Encounter (Signed)
I spoke with the patient over the telephone. She had recent visit to the emergency department in July with CT angiogram that came back negative for recurrence of PE. She is compliant taking Xarelto but has heavy menstruation every month. She is recently found to be anemic. The patient denies recent smoking. We discussed the risk and benefits of discontinuation of Xarelto after provoked DVT/PE She is in agreement to discontinue Xarelto at the end of the month and then switch over to 325 mg daily aspirin. We discussed some other risk factors of recurrence of DVT and PE including smoking, prolonged immobility, hormone replacement therapy and surgeries. The patient will call me in the future if she is planning invasive procedure that might put her at risk of recurrence of DVT and PE. She will need aggressive prophylactic therapy. With her prior history of heparin-induced thrombocytopenia, heparin-containing products should be avoided. I addressed all her questions and concerns. The patient requested screening mammogram due to family history of breast cancer and I have requested it to be done and an order is placed.

## 2016-06-25 ENCOUNTER — Other Ambulatory Visit: Payer: Self-pay | Admitting: Nurse Practitioner

## 2016-06-25 DIAGNOSIS — K219 Gastro-esophageal reflux disease without esophagitis: Secondary | ICD-10-CM

## 2016-07-07 ENCOUNTER — Ambulatory Visit (INDEPENDENT_AMBULATORY_CARE_PROVIDER_SITE_OTHER): Payer: Managed Care, Other (non HMO) | Admitting: Nurse Practitioner

## 2016-07-07 ENCOUNTER — Encounter: Payer: Self-pay | Admitting: Nurse Practitioner

## 2016-07-07 VITALS — BP 151/78 | HR 71 | Temp 99.0°F | Ht 68.0 in | Wt 218.0 lb

## 2016-07-07 DIAGNOSIS — I2583 Coronary atherosclerosis due to lipid rich plaque: Secondary | ICD-10-CM

## 2016-07-07 DIAGNOSIS — J0101 Acute recurrent maxillary sinusitis: Secondary | ICD-10-CM

## 2016-07-07 DIAGNOSIS — G4700 Insomnia, unspecified: Secondary | ICD-10-CM | POA: Diagnosis not present

## 2016-07-07 DIAGNOSIS — I2510 Atherosclerotic heart disease of native coronary artery without angina pectoris: Secondary | ICD-10-CM

## 2016-07-07 MED ORDER — CEFDINIR 300 MG PO CAPS: 300.0000 mg | ORAL_CAPSULE | Freq: Two times a day (BID) | ORAL | 0 refills | Status: DC

## 2016-07-07 MED ORDER — ZOLPIDEM TARTRATE 10 MG PO TABS: 10.0000 mg | ORAL_TABLET | Freq: Every evening | ORAL | 1 refills | Status: DC | PRN

## 2016-07-07 NOTE — Patient Instructions (Signed)

## 2016-07-07 NOTE — Progress Notes (Signed)
Subjective:     Brenda Cruz is a 50 y.o. female who presents for evaluation of sinus pain. Symptoms include: congestion, facial pain, headaches, nasal congestion, post nasal drip, sinus pressure and sore throat. Onset of symptoms was a few weeks ago. Symptoms have been gradually worsening since that time. Past history is significant for occasional episodes of bronchitis and pneumonia. Patient is a former smoker, quit 1 years ago.  The following portions of the patient's history were reviewed and updated as appropriate: allergies, current medications, past family history, past medical history, past social history, past surgical history and problem list.  * Still having trouble sleeping- lorazepam not helpful- would like to try something else.  Review of Systems Pertinent items noted in HPI and remainder of comprehensive ROS otherwise negative.   Objective:    BP (!) 151/78   Pulse 71   Temp 99 F (37.2 C) (Oral)   Ht 5\' 8"  (1.727 m)   Wt 218 lb (98.9 kg)   BMI 33.15 kg/m  General appearance: alert and cooperative Eyes: conjunctivae/corneas clear. PERRL, EOM's intact. Fundi benign. Ears: normal TM's and external ear canals both ears Nose: clear discharge, moderate congestion, turbinates red, sinus tenderness bilateral Throat: lips, mucosa, and tongue normal; teeth and gums normal Neck: no adenopathy, no carotid bruit, no JVD, supple, symmetrical, trachea midline and thyroid not enlarged, symmetric, no tenderness/mass/nodules Lungs: clear to auscultation bilaterally Heart: regular rate and rhythm, S1, S2 normal, no murmur, click, rub or gallop    Assessment:    Acute bacterial sinusitis  And insomnia.    Plan:   1. Take meds as prescribed 2. Use a cool mist humidifier especially during the winter months and when heat has been humid. 3. Use saline nose sprays frequently 4. Saline irrigations of the nose can be very helpful if done frequently.  * 4X daily for 1 week*  * Use of  a nettie pot can be helpful with this. Follow directions with this* 5. Drink plenty of fluids 6. Keep thermostat turn down low 7.For any cough or congestion  Use plain Mucinex- regular strength or max strength is fine   * Children- consult with Pharmacist for dosing 8. For fever or aces or pains- take tylenol or ibuprofen appropriate for age and weight.  * for fevers greater than 101 orally you may alternate ibuprofen and tylenol every  3 hours.    Bedtime routine Do not take lorazepam and ambien at same time.  Meds ordered this encounter  Medications  . aspirin 325 MG tablet    Sig: Take 325 mg by mouth daily.  Marland Kitchen zolpidem (AMBIEN) 10 MG tablet    Sig: Take 1 tablet (10 mg total) by mouth at bedtime as needed for sleep.    Dispense:  30 tablet    Refill:  1    Order Specific Question:   Supervising Provider    Answer:   VINCENT, CAROL L [[id number]]  . cefdinir (OMNICEF) 300 MG capsule    Sig: Take 1 capsule (300 mg total) by mouth 2 (two) times daily. 1 po BID    Dispense:  20 capsule    Refill:  0    Order Specific Question:   Supervising Provider    Answer:   Eustaquio Maize [[id number]]     [name] [name], [name]

## 2016-07-14 ENCOUNTER — Other Ambulatory Visit: Payer: Self-pay | Admitting: Nurse Practitioner

## 2016-07-15 ENCOUNTER — Other Ambulatory Visit: Payer: Self-pay

## 2016-07-15 MED ORDER — TRAMADOL HCL 50 MG PO TABS: 50.0000 mg | ORAL_TABLET | Freq: Three times a day (TID) | ORAL | 0 refills | Status: DC | PRN

## 2016-07-15 NOTE — Telephone Encounter (Signed)
Tramadol rx ready for pick up - will need to be seen in future for pain meds

## 2016-07-16 ENCOUNTER — Other Ambulatory Visit: Payer: Self-pay | Admitting: Hematology and Oncology

## 2016-07-26 ENCOUNTER — Other Ambulatory Visit: Payer: Self-pay

## 2016-07-26 MED ORDER — LORAZEPAM 1 MG PO TABS: 1.0000 mg | ORAL_TABLET | Freq: Three times a day (TID) | ORAL | 1 refills | Status: DC | PRN

## 2016-08-09 ENCOUNTER — Ambulatory Visit: Payer: Managed Care, Other (non HMO) | Admitting: Pediatrics

## 2016-08-09 ENCOUNTER — Encounter: Payer: Self-pay | Admitting: Family Medicine

## 2016-08-09 ENCOUNTER — Ambulatory Visit (INDEPENDENT_AMBULATORY_CARE_PROVIDER_SITE_OTHER): Payer: PRIVATE HEALTH INSURANCE

## 2016-08-09 ENCOUNTER — Ambulatory Visit (INDEPENDENT_AMBULATORY_CARE_PROVIDER_SITE_OTHER): Payer: PRIVATE HEALTH INSURANCE | Admitting: Family Medicine

## 2016-08-09 VITALS — BP 125/79 | HR 77 | Temp 99.1°F | Ht 68.0 in | Wt 219.0 lb

## 2016-08-09 DIAGNOSIS — R059 Cough, unspecified: Secondary | ICD-10-CM

## 2016-08-09 DIAGNOSIS — J0101 Acute recurrent maxillary sinusitis: Secondary | ICD-10-CM | POA: Diagnosis not present

## 2016-08-09 DIAGNOSIS — R05 Cough: Secondary | ICD-10-CM

## 2016-08-09 MED ORDER — LEVOFLOXACIN 500 MG PO TABS: 500.0000 mg | ORAL_TABLET | Freq: Every day | ORAL | 0 refills | Status: DC

## 2016-08-09 MED ORDER — PREDNISONE 20 MG PO TABS: 20.0000 mg | ORAL_TABLET | Freq: Every day | ORAL | 0 refills | Status: DC

## 2016-08-09 NOTE — Patient Instructions (Signed)
Great to see you!  Start levaquin, be sure to finish all antibiotics. We will call with chest x ray results within 2-3 days.

## 2016-08-09 NOTE — Progress Notes (Signed)
   HPI  Patient presents today with cough.  Patient was seen on 9/21 and treated for sinus infection with Omnicef. Her facial pain improved and cough improved for a few days. However the cough set back in and she began having deeper more productive cough since that time. She also complains of mild shortness of breath and persistent central chest pain after her CABG. She is careful to say that she does not have exertional pain but that she has incisional pain that is worsened by coughing.  She denies any fevers, however she does have severe malaise and fatigue.  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  She has had slight recurrence of left-sided maxillary facial pain and pressure  PMH: Smoking status noted ROS: Per HPI  Objective: BP 125/79   Pulse 77   Temp 99.1 F (37.3 C) (Oral)   Ht 5\' 8"  (1.727 m)   Wt 219 lb (99.3 kg)   BMI 33.30 kg/m  Gen: NAD, alert, cooperative with exam HEENT: NCAT, left-sided maxillary tenderness to palpation, no right-sided tenderness or frontal sinus tenderness to palpation CV: RRR, good S1/S2, no murmur Resp: CTABL, no wheezes, non-labored Ext: No edema, warm Neuro: Alert and oriented, No gross deficits  Assessment and plan:  # Acute recurrent sinusitis With cough concerning for pneumonia. She has some healthcare exposure occupationally. Treat with Levaquin which would easily cover the pneumonia as well Chest x-ray pending Continue supportive care and maintain low threshold for return History of smoking I have also sent her some prednisone     Orders Placed This Encounter  Procedures  . DG Chest 2 View    Standing Status:   Future    Number of Occurrences:   1    Standing Expiration Date:   10/09/2017    Order Specific Question:   Reason for Exam (SYMPTOM  OR DIAGNOSIS REQUIRED)    Answer:   cough, s/p cabg. Eval for CAP    Order Specific Question:   Is patient pregnant?    Answer:   No    Order Specific  Question:   Preferred imaging location?    Answer:   External    Meds ordered this encounter  Medications  . levofloxacin (LEVAQUIN) 500 MG tablet    Sig: Take 1 tablet (500 mg total) by mouth daily.    Dispense:  10 tablet    Refill:  0  . predniSONE (DELTASONE) 20 MG tablet    Sig: Take 1 tablet (20 mg total) by mouth daily with breakfast.    Dispense:  10 tablet    Refill:  0    Laroy Apple, MD [facility] 08/09/2016, 3:52 PM

## 2016-08-10 ENCOUNTER — Telehealth: Payer: Self-pay | Admitting: Nurse Practitioner

## 2016-08-10 NOTE — Telephone Encounter (Signed)
lmtcb & left message this was visible in Mychart

## 2016-08-11 NOTE — Telephone Encounter (Signed)
Patient aware and she will be back by to have labs drawn

## 2016-08-11 NOTE — Telephone Encounter (Signed)
Patient did not have blood work done when here- eeds to come in to have done.

## 2016-09-01 ENCOUNTER — Ambulatory Visit (INDEPENDENT_AMBULATORY_CARE_PROVIDER_SITE_OTHER): Payer: PRIVATE HEALTH INSURANCE | Admitting: Family Medicine

## 2016-09-01 ENCOUNTER — Encounter: Payer: Self-pay | Admitting: Family Medicine

## 2016-09-01 VITALS — BP 133/81 | HR 74 | Temp 98.5°F | Ht 68.0 in | Wt 216.6 lb

## 2016-09-01 DIAGNOSIS — J0100 Acute maxillary sinusitis, unspecified: Secondary | ICD-10-CM

## 2016-09-01 DIAGNOSIS — R05 Cough: Secondary | ICD-10-CM | POA: Diagnosis not present

## 2016-09-01 DIAGNOSIS — R059 Cough, unspecified: Secondary | ICD-10-CM

## 2016-09-01 MED ORDER — HYDROCODONE-HOMATROPINE 5-1.5 MG/5ML PO SYRP: 5.0000 mL | ORAL_SOLUTION | Freq: Four times a day (QID) | ORAL | 0 refills | Status: DC | PRN

## 2016-09-01 MED ORDER — DOXYCYCLINE HYCLATE 100 MG PO TABS: 100.0000 mg | ORAL_TABLET | Freq: Two times a day (BID) | ORAL | 0 refills | Status: DC

## 2016-09-01 NOTE — Patient Instructions (Addendum)
Great to see you!  Finish all antibiotics, take probiotics while you are on them  Do not drive after taking hycodan

## 2016-09-01 NOTE — Progress Notes (Signed)
   HPI  Patient presents today here with cough and sinus pressure.  Patient explains that after her visit last time she had mild transient improvement of cough and malaise but then had resumption of symptoms over the last 5-6 days. She complains of deep cough, malaise, and sinus pressure and pain in her maxillary sinuses.  She's tolerating foods and fluids normally. She does not have any chest pain, she is status post CABG last year about this time.  PMH: Smoking status noted ROS: Per HPI  Objective: BP 133/81   Pulse 74   Temp 98.5 F (36.9 C) (Oral)   Ht 5\' 8"  (1.727 m)   Wt 216 lb 9.6 oz (98.2 kg)   BMI 32.93 kg/m  Gen: NAD, alert, cooperative with exam HEENT: NCAT, bilateral maxillary sinus tenderness to palpation, nares with swelling of the turbinates bilaterally, TMs normal bilaterally, oropharynx moist and clear CV: RRR, good S1/S2, no murmur Resp: CTABL, no wheezes, non-labored Ext: No edema, warm Neuro: Alert and oriented, No gross deficits  Assessment and plan:  # Cough, acute maxillary sinusitis Persistent cough, despite aggressive treatment with Levaquin. I'm treating her with doxycycline primarily to cover Sinusitis Hycodan for nighttime cough Discussed with her possible post infectious inflammatory changes of the lungs that may take several weeks to heal. If she has persistent cough after this course I would consider a short, approximately one month, treatment with inhaled corticosteroid/LABA combo to reduce cough while she recovers.     Meds ordered this encounter  Medications  . doxycycline (VIBRA-TABS) 100 MG tablet    Sig: Take 1 tablet (100 mg total) by mouth 2 (two) times daily. 1 po bid    Dispense:  20 tablet    Refill:  0  . HYDROcodone-homatropine (HYCODAN) 5-1.5 MG/5ML syrup    Sig: Take 5 mLs by mouth every 6 (six) hours as needed for cough.    Dispense:  120 mL    Refill:  0    Laroy Apple, MD [facility] 09/01/2016, 4:21 PM

## 2016-09-12 ENCOUNTER — Other Ambulatory Visit: Payer: Self-pay | Admitting: Nurse Practitioner

## 2016-09-12 DIAGNOSIS — K219 Gastro-esophageal reflux disease without esophagitis: Secondary | ICD-10-CM

## 2016-09-15 ENCOUNTER — Telehealth: Payer: Self-pay | Admitting: Nurse Practitioner

## 2016-09-15 NOTE — Telephone Encounter (Signed)
Pt notified of recommendation appt scheduled 

## 2016-09-15 NOTE — Telephone Encounter (Signed)
Do you want to rck pt ?

## 2016-09-15 NOTE — Telephone Encounter (Signed)
No easy alternatives, would recommend follow up.   Laroy Apple, MD [facility] 09/15/2016, 11:48 AM

## 2016-09-16 ENCOUNTER — Encounter: Payer: Self-pay | Admitting: Family Medicine

## 2016-09-16 ENCOUNTER — Ambulatory Visit (INDEPENDENT_AMBULATORY_CARE_PROVIDER_SITE_OTHER): Payer: PRIVATE HEALTH INSURANCE | Admitting: Family Medicine

## 2016-09-16 VITALS — BP 112/66 | HR 69 | Temp 97.5°F | Ht 68.0 in | Wt 219.0 lb

## 2016-09-16 DIAGNOSIS — R591 Generalized enlarged lymph nodes: Secondary | ICD-10-CM

## 2016-09-16 DIAGNOSIS — H9201 Otalgia, right ear: Secondary | ICD-10-CM | POA: Diagnosis not present

## 2016-09-16 DIAGNOSIS — J209 Acute bronchitis, unspecified: Secondary | ICD-10-CM | POA: Diagnosis not present

## 2016-09-16 NOTE — Progress Notes (Signed)
   HPI  Patient presents today for persistent cough.  Patient has had a prolonged illness, over the last 6 weeks or so she's had persistent cough, malaise, and intermittent shortness of breath. She has been treated with 2 courses of antibiotics, doxycycline and Levaquin as well as a course of steroids. She has improved marginally but has waxed and waned.  She complains of severe right ear pain, this was less severe and her last follow-up.  she has a nebulizer which is pretty effective.  She's tolerating foods and fluids. She denies chest pain, overt fever, or persistent dyspnea.   PMH: Smoking status noted ROS: Per HPI  Objective: BP 112/66   Pulse 69   Temp 97.5 F (36.4 C) (Oral)   Ht 5\' 8"  (1.727 m)   Wt 219 lb (99.3 kg)   BMI 33.30 kg/m  Gen: NAD, alert, cooperative with exam HEENT: NCAT, oropharynx moist and clear, TMs normal bilaterally, nares clear Neck: Scattered tender lymphadenopathy in the anterior cervical chain CV: RRR, good S1/S2, no murmur Resp: CTABL, no wheezes, non-labored, unchanged after nebulizer Abd: SNTND, BS present, no guarding or organomegaly Ext: No edema, warm Neuro: Alert and oriented, No gross deficits  Assessment and plan:  # Acute bronchitis, right ear pain, lymphadenopathy Unusual prolonged illness Treated aggressively so far without much improvement Labs including EBV with drawn pout illness with LAD, watchful waiting,  low threshold for return Given 6 weeks worth of samples for 80 mg Symbicort   Orders Placed This Encounter  Procedures  . CBC with Differential  . Epstein-Barr virus VCA antibody panel    Meds ordered this encounter  Medications  . budesonide-formoterol (SYMBICORT) 80-4.5 MCG/ACT inhaler    Sig: Inhale 2 puffs into the lungs 2 (two) times daily.    Dispense:  3 Inhaler    Refill:  0    Laroy Apple, MD [facility] 09/17/2016, 7:51 AM

## 2016-09-17 LAB — CBC WITH DIFFERENTIAL/PLATELET
BASOS ABS: 0 10*3/uL (ref 0.0–0.2)
Basos: 0 %
EOS (ABSOLUTE): 0.4 10*3/uL (ref 0.0–0.4)
Eos: 4 %
Hematocrit: 35.2 % (ref 34.0–46.6)
Hemoglobin: 11.6 g/dL (ref 11.1–15.9)
IMMATURE GRANS (ABS): 0.1 10*3/uL (ref 0.0–0.1)
IMMATURE GRANULOCYTES: 1 %
LYMPHS: 30 %
Lymphocytes Absolute: 3.3 10*3/uL — ABNORMAL HIGH (ref 0.7–3.1)
MCH: 28.6 pg (ref 26.6–33.0)
MCHC: 33 g/dL (ref 31.5–35.7)
MCV: 87 fL (ref 79–97)
MONOCYTES: 9 %
Monocytes Absolute: 1 10*3/uL — ABNORMAL HIGH (ref 0.1–0.9)
NEUTROS PCT: 56 %
Neutrophils Absolute: 6.3 10*3/uL (ref 1.4–7.0)
PLATELETS: 271 10*3/uL (ref 150–379)
RBC: 4.05 x10E6/uL (ref 3.77–5.28)
RDW: 14.7 % (ref 12.3–15.4)
WBC: 11.1 10*3/uL — AB (ref 3.4–10.8)

## 2016-09-17 LAB — EPSTEIN-BARR VIRUS VCA ANTIBODY PANEL: EBV VCA IGG: 132 U/mL — AB (ref 0.0–17.9)

## 2016-09-17 MED ORDER — BUDESONIDE-FORMOTEROL FUMARATE 80-4.5 MCG/ACT IN AERO: 2.0000 | INHALATION_SPRAY | Freq: Two times a day (BID) | RESPIRATORY_TRACT | 0 refills | Status: DC

## 2016-10-17 ENCOUNTER — Other Ambulatory Visit: Payer: Self-pay | Admitting: Nurse Practitioner

## 2016-10-17 DIAGNOSIS — K219 Gastro-esophageal reflux disease without esophagitis: Secondary | ICD-10-CM

## 2016-10-18 NOTE — Telephone Encounter (Signed)
Last refill without being seen Please call in xanax with 0 refills 

## 2016-10-18 NOTE — Telephone Encounter (Signed)
Please call in lorazepam and  with 0 refills Has to be seen for pain medication

## 2016-10-25 ENCOUNTER — Ambulatory Visit (INDEPENDENT_AMBULATORY_CARE_PROVIDER_SITE_OTHER): Payer: PRIVATE HEALTH INSURANCE | Admitting: Nurse Practitioner

## 2016-10-25 ENCOUNTER — Encounter: Payer: Self-pay | Admitting: Nurse Practitioner

## 2016-10-25 VITALS — BP 130/75 | HR 71 | Temp 97.2°F | Ht 68.0 in | Wt 218.0 lb

## 2016-10-25 DIAGNOSIS — R300 Dysuria: Secondary | ICD-10-CM | POA: Diagnosis not present

## 2016-10-25 DIAGNOSIS — N3000 Acute cystitis without hematuria: Secondary | ICD-10-CM | POA: Diagnosis not present

## 2016-10-25 DIAGNOSIS — J441 Chronic obstructive pulmonary disease with (acute) exacerbation: Secondary | ICD-10-CM

## 2016-10-25 LAB — MICROSCOPIC EXAMINATION: Epithelial Cells (non renal): >10 /hpf — AB (ref 0–10)

## 2016-10-25 LAB — URINALYSIS, COMPLETE
BILIRUBIN UA: NEGATIVE
GLUCOSE, UA: NEGATIVE
KETONES UA: NEGATIVE
NITRITE UA: NEGATIVE
UUROB: 0.2 mg/dL (ref 0.2–1.0)
pH, UA: 5 (ref 5.0–7.5)

## 2016-10-25 MED ORDER — CIPROFLOXACIN HCL 500 MG PO TABS: 500.0000 mg | ORAL_TABLET | Freq: Two times a day (BID) | ORAL | 0 refills | Status: DC

## 2016-10-25 MED ORDER — TIOTROPIUM BROMIDE MONOHYDRATE 18 MCG IN CAPS: 18.0000 ug | ORAL_CAPSULE | Freq: Every day | RESPIRATORY_TRACT | 5 refills | Status: DC

## 2016-10-25 MED ORDER — PREDNISONE 10 MG (21) PO TBPK
ORAL_TABLET | ORAL | 0 refills | Status: DC
Start: 2016-10-25 — End: 2016-12-07

## 2016-10-25 MED ORDER — HYDROCODONE-HOMATROPINE 5-1.5 MG/5ML PO SYRP: 5.0000 mL | ORAL_SOLUTION | Freq: Four times a day (QID) | ORAL | 0 refills | Status: DC | PRN

## 2016-10-25 NOTE — Progress Notes (Signed)
Subjective:     Brenda Cruz is a 51 y.o. female who presents for evaluation of sinus pain. Symptoms include: congestion, cough, facial pain, headaches and hoarce. Onset of symptoms was 2 months ago. Symptoms have been gradually worsening since that time. Past history is significant for chronic bronchitis. Patient is a smoker  (1 ppd x 30 yrs). * she also is having slight dysuriaa and frequency for the last several days  The following portions of the patient's history were reviewed and updated as appropriate: allergies, current medications, past family history, past medical history, past social history, past surgical history and problem list.  Review of Systems Pertinent items noted in HPI and remainder of comprehensive ROS otherwise negative.   Objective:    BP 130/75   Pulse 71   Temp 97.2 F (36.2 C) (Oral)   Ht 5\' 8"  (1.727 m)   Wt 218 lb (98.9 kg)   BMI 33.15 kg/m  General appearance: alert and cooperative Eyes: conjunctivae/corneas clear. PERRL, EOM's intact. Fundi benign. Ears: normal TM's and external ear canals both ears Nose: clear discharge, moderate congestion, turbinates red, sinus tenderness bilateral Throat: lips, mucosa, and tongue normal; teeth and gums normal Neck: no adenopathy, no carotid bruit, no JVD, supple, symmetrical, trachea midline and thyroid not enlarged, symmetric, no tenderness/mass/nodules Lungs: rhonchi bibasilar Heart: regular rate and rhythm, S1, S2 normal, no murmur, click, rub or gallop   Abdomen- soft nontender with positive bowel sounds throughout.   Assessment:    Acute bacterial sinusitis and COPD exacerbation UTI.    Plan:   1. Take meds as prescribed 2. Use a cool mist humidifier especially during the winter months and when heat has been humid. 3. Use saline nose sprays frequently 4. Saline irrigations of the nose can be very helpful if done frequently.  * 4X daily for 1 week*  * Use of a nettie pot can be helpful with this.  Follow directions with this* 5. Drink plenty of fluids 6. Keep thermostat turn down low 7.For any cough or congestion  Use plain Mucinex- regular strength or max strength is fine   * Children- consult with Pharmacist for dosing 8. For fever or aces or pains- take tylenol or ibuprofen appropriate for age and weight.  * for fevers greater than 101 orally you may alternate ibuprofen and tylenol every  3 hours.   Take medication as prescribe Cotton underwear Take shower not bath Cranberry juice, yogurt Force fluids AZO over the counter X2 days Culture pending RTO prn   Meds ordered this encounter  Medications  . HYDROcodone-homatropine (HYCODAN) 5-1.5 MG/5ML syrup    Sig: Take 5 mLs by mouth every 6 (six) hours as needed for cough.    Dispense:  120 mL    Refill:  0    Order Specific Question:   Supervising Provider    Answer:   VINCENT, CAROL L [[id number]]  . tiotropium (SPIRIVA HANDIHALER) 18 MCG inhalation capsule    Sig: Place 1 capsule (18 mcg total) into inhaler and inhale daily.    Dispense:  30 capsule    Refill:  5    Order Specific Question:   Supervising Provider    Answer:   VINCENT, CAROL L [[id number]]  . predniSONE (STERAPRED UNI-PAK 21 TAB) 10 MG (21) TBPK tablet    Sig: As directed x 6 days    Dispense:  21 tablet    Refill:  0    Order Specific Question:   Supervising Provider  Answer:   VINCENT, CAROL L [[id number]]  . ciprofloxacin (CIPRO) 500 MG tablet    Sig: Take 1 tablet (500 mg total) by mouth 2 (two) times daily.    Dispense:  20 tablet    Refill:  0    Order Specific Question:   Supervising Provider    Answer:   Eustaquio Maize [[id number]]   [name] [name], [name]

## 2016-10-25 NOTE — Patient Instructions (Signed)

## 2016-10-31 ENCOUNTER — Other Ambulatory Visit: Payer: Self-pay | Admitting: Nurse Practitioner

## 2016-10-31 DIAGNOSIS — K219 Gastro-esophageal reflux disease without esophagitis: Secondary | ICD-10-CM

## 2016-11-20 ENCOUNTER — Other Ambulatory Visit: Payer: Self-pay | Admitting: Nurse Practitioner

## 2016-11-22 NOTE — Telephone Encounter (Signed)
Please call in xanax with 1 refills 

## 2016-11-24 ENCOUNTER — Other Ambulatory Visit: Payer: Self-pay | Admitting: Nurse Practitioner

## 2016-11-24 DIAGNOSIS — F411 Generalized anxiety disorder: Secondary | ICD-10-CM

## 2016-12-07 ENCOUNTER — Encounter: Payer: Self-pay | Admitting: Physician Assistant

## 2016-12-07 ENCOUNTER — Ambulatory Visit (INDEPENDENT_AMBULATORY_CARE_PROVIDER_SITE_OTHER): Payer: PRIVATE HEALTH INSURANCE | Admitting: Physician Assistant

## 2016-12-07 VITALS — BP 120/73 | HR 79 | Temp 97.7°F | Ht 68.0 in | Wt 220.8 lb

## 2016-12-07 DIAGNOSIS — R05 Cough: Secondary | ICD-10-CM

## 2016-12-07 DIAGNOSIS — J441 Chronic obstructive pulmonary disease with (acute) exacerbation: Secondary | ICD-10-CM | POA: Diagnosis not present

## 2016-12-07 DIAGNOSIS — I10 Essential (primary) hypertension: Secondary | ICD-10-CM

## 2016-12-07 DIAGNOSIS — F339 Major depressive disorder, recurrent, unspecified: Secondary | ICD-10-CM

## 2016-12-07 DIAGNOSIS — N393 Stress incontinence (female) (male): Secondary | ICD-10-CM | POA: Diagnosis not present

## 2016-12-07 DIAGNOSIS — R058 Other specified cough: Secondary | ICD-10-CM | POA: Insufficient documentation

## 2016-12-07 DIAGNOSIS — J449 Chronic obstructive pulmonary disease, unspecified: Secondary | ICD-10-CM | POA: Insufficient documentation

## 2016-12-07 DIAGNOSIS — T464X5A Adverse effect of angiotensin-converting-enzyme inhibitors, initial encounter: Secondary | ICD-10-CM | POA: Diagnosis not present

## 2016-12-07 LAB — URINALYSIS, COMPLETE
Bilirubin, UA: NEGATIVE
GLUCOSE, UA: NEGATIVE
Ketones, UA: NEGATIVE
Leukocytes, UA: NEGATIVE
NITRITE UA: NEGATIVE
PH UA: 5.5 (ref 5.0–7.5)
Protein, UA: NEGATIVE
RBC, UA: NEGATIVE
Specific Gravity, UA: 1.01 (ref 1.005–1.030)
UUROB: 0.2 mg/dL (ref 0.2–1.0)

## 2016-12-07 LAB — MICROSCOPIC EXAMINATION
RBC MICROSCOPIC, UA: NONE SEEN /HPF (ref 0–?)
Renal Epithel, UA: NONE SEEN /hpf

## 2016-12-07 MED ORDER — FLUOXETINE HCL 40 MG PO CAPS: 40.0000 mg | ORAL_CAPSULE | Freq: Every day | ORAL | 11 refills | Status: DC

## 2016-12-07 MED ORDER — CEFDINIR 300 MG PO CAPS: 300.0000 mg | ORAL_CAPSULE | Freq: Two times a day (BID) | ORAL | 0 refills | Status: DC

## 2016-12-07 MED ORDER — HYDROCOD POLST-CPM POLST ER 10-8 MG/5ML PO SUER: 5.0000 mL | Freq: Two times a day (BID) | ORAL | 0 refills | Status: DC | PRN

## 2016-12-07 MED ORDER — PREDNISONE 10 MG (21) PO TBPK: ORAL_TABLET | ORAL | 0 refills | Status: DC

## 2016-12-07 MED ORDER — AMLODIPINE BESYLATE 2.5 MG PO TABS: 2.5000 mg | ORAL_TABLET | Freq: Every day | ORAL | 3 refills | Status: DC

## 2016-12-07 NOTE — Progress Notes (Signed)
BP 120/73   Pulse 79   Temp 97.7 F (36.5 C) (Oral)   Ht 5\' 8"  (1.727 m)   Wt 220 lb 12.8 oz (100.2 kg)   BMI 33.57 kg/m    Subjective:    Patient ID: Brenda Cruz, female    DOB: 1965-12-22, 51 y.o.   MRN: [id number]  HPI: Brenda Cruz is a 51 y.o. female presenting on 12/07/2016 for Cough (since October, COPD med has not helped, worse at night, urinary incontinence, productive at times); Fatigue; Sore Throat (fever off &  on, ST off & on as well since November); and Urinary Incontinence  Patient with several days of progressing upper respiratory and bronchial symptoms. Initially there was more upper respiratory congestion. This progressed to having significant cough that is productive throughout the day and severe at night. There is occasional wheezing after coughing. They will sometimes have slight dyspnea on exertion. It is productive mucus that is yellow in color. Denies any blood.  Has cough possibly related to ACE, will plan to stop and see if some of cough is better. If after another treatment and change of ACE, consider pulmonology consult.  Relevant past medical, surgical, family and social history reviewed and updated as indicated. Allergies and medications reviewed and updated.  Past Medical History:  Diagnosis Date  . Anxiety   . Asthma   . DDD (degenerative disc disease)   . GERD (gastroesophageal reflux disease)   . Hemorrhoids   . Hyperlipemia   . Hypertension   . Iron deficiency anemia 11/23/2015  . Pulmonary emboli ([place]) 12/07/2015  . TMJ (temporomandibular joint disorder)     Past Surgical History:  Procedure Laterality Date  . CARDIAC CATHETERIZATION N/A 10/29/2015   Procedure: Left Heart Cath and Coronary Angiography;  Surgeon: [name], MD;  Location: [facility];  Service: Cardiovascular;  Laterality: N/A;  . CHOLECYSTECTOMY    . CORONARY ARTERY BYPASS GRAFT N/A 11/02/2015   Procedure: CORONARY ARTERY BYPASS GRAFTING (CABG)x 1 using  left internal mammary artery.;  Surgeon: Melrose Nakayama, MD;  Location: [facility];  Service: Open Heart Surgery;  Laterality: N/A;  . LUMBAR FUSION    . TEE WITHOUT CARDIOVERSION N/A 11/02/2015   Procedure: TRANSESOPHAGEAL ECHOCARDIOGRAM (TEE);  Surgeon: Melrose Nakayama, MD;  Location: [facility];  Service: Open Heart Surgery;  Laterality: N/A;    Review of Systems  Constitutional: Positive for fatigue. Negative for activity change, appetite change and chills.  HENT: Positive for congestion, postnasal drip and sore throat.   Eyes: Negative.   Respiratory: Positive for cough and wheezing.   Cardiovascular: Negative.  Negative for chest pain, palpitations and leg swelling.  Gastrointestinal: Negative.   Genitourinary: Positive for enuresis.  Musculoskeletal: Negative.   Skin: Negative.   Neurological: Positive for headaches.   Current Outpatient Prescriptions on File Prior to Visit  Medication Sig Dispense Refill  . acetaminophen (TYLENOL) 500 MG tablet Take 1,000 mg by mouth every 6 (six) hours as needed for moderate pain.    Marland Kitchen albuterol (PROVENTIL HFA;VENTOLIN HFA) 108 (90 Base) MCG/ACT inhaler Inhale 2 puffs into the lungs every 4 (four) hours as needed for wheezing or shortness of breath. 1 Inhaler 0  . aspirin 325 MG tablet Take 325 mg by mouth daily.    . clonazePAM (KLONOPIN) 0.5 MG tablet TAKE ONE TABLET TWICE DAILY AS NEEDED 60 tablet 1  . esomeprazole (NEXIUM) 40 MG capsule TAKE ONE (1) CAPSULE EACH DAY 30 capsule 1  .  ferrous sulfate 325 (65 FE) MG tablet Take 325 mg by mouth daily as needed (only takes for menses).     . LORazepam (ATIVAN) 1 MG tablet TAKE 1 TABLET TWICE DAILY AS NEEDED FOR ANXIETY 30 tablet 1  . metoprolol tartrate (LOPRESSOR) 25 MG tablet Take 1 tablet (25 mg total) by mouth 2 (two) times daily. 60 tablet 3  . Multiple Vitamin (MULTIVITAMIN WITH MINERALS) TABS tablet Take 1 tablet by mouth daily.    . Probiotic Product (RA PROBIOTIC GUMMIES) CHEW Chew 1 each  by mouth daily.    Marland Kitchen tiotropium (SPIRIVA HANDIHALER) 18 MCG inhalation capsule Place 1 capsule (18 mcg total) into inhaler and inhale daily. 30 capsule 5  . traMADol (ULTRAM) 50 MG tablet Take 1 tablet (50 mg total) by mouth every 8 (eight) hours as needed for moderate pain. 60 tablet 0   No current facility-administered medications on file prior to visit.          Objective:    BP 120/73   Pulse 79   Temp 97.7 F (36.5 C) (Oral)   Ht 5\' 8"  (1.727 m)   Wt 220 lb 12.8 oz (100.2 kg)   BMI 33.57 kg/m    Physical Exam  Constitutional: She is oriented to person, place, and time. She appears well-developed and well-nourished.  HENT:  Head: Normocephalic and atraumatic.  Right Ear: There is drainage and tenderness.  Left Ear: There is drainage and tenderness.  Nose: Mucosal edema and rhinorrhea present. Right sinus exhibits maxillary sinus tenderness and frontal sinus tenderness. Left sinus exhibits maxillary sinus tenderness and frontal sinus tenderness.  Mouth/Throat: Oropharyngeal exudate and posterior oropharyngeal erythema present.  Eyes: Conjunctivae and EOM are normal. Pupils are equal, round, and reactive to light.  Neck: Normal range of motion. Neck supple.  Cardiovascular: Normal rate, regular rhythm, normal heart sounds and intact distal pulses.   Pulmonary/Chest: Effort normal. She has wheezes in the right upper field and the left upper field.  Abdominal: Soft. Bowel sounds are normal. She exhibits no distension and no mass. There is tenderness in the suprapubic area. There is no rebound, no guarding and no CVA tenderness.  Neurological: She is alert and oriented to person, place, and time. She has normal reflexes.  Skin: Skin is warm and dry. No rash noted.  Psychiatric: She has a normal mood and affect. Her behavior is normal. Judgment and thought content normal.    Results for orders placed or performed in visit on 12/07/16  Microscopic Examination  Result Value Ref  Range   WBC, UA 0-5 0 - 5 /hpf   RBC, UA None seen 0 - 2 /hpf   Epithelial Cells (non renal) >10 (A) 0 - 10 /hpf   Renal Epithel, UA None seen None seen /hpf   Bacteria, UA Moderate (A) None seen/Few  Urinalysis, Complete  Result Value Ref Range   Specific Gravity, UA 1.010 1.005 - 1.030   pH, UA 5.5 5.0 - 7.5   Color, UA Yellow Yellow   Appearance Ur Clear Clear   Leukocytes, UA Negative Negative   Protein, UA Negative Negative/Trace   Glucose, UA Negative Negative   Ketones, UA Negative Negative   RBC, UA Negative Negative   Bilirubin, UA Negative Negative   Urobilinogen, Ur 0.2 0.2 - 1.0 mg/dL   Nitrite, UA Negative Negative   Microscopic Examination See below:       Assessment & Plan:   1. Stress incontinence of urine - Urinalysis, Complete  2. COPD exacerbation (HCC) - cefdinir (OMNICEF) 300 MG capsule; Take 1 capsule (300 mg total) by mouth 2 (two) times daily. 1 po BID  Dispense: 20 capsule; Refill: 0 - predniSONE (STERAPRED UNI-PAK 21 TAB) 10 MG (21) TBPK tablet; As directed x 6 days  Dispense: 21 tablet; Refill: 0 - chlorpheniramine-HYDROcodone (TUSSIONEX) 10-8 MG/5ML SUER; Take 5 mLs by mouth every 12 (twelve) hours as needed for cough.  Dispense: 120 mL; Refill: 0  3. ACE-inhibitor cough Change to amlodipine  Continue all other maintenance medications as listed above.  Follow up plan: Return in about 2 weeks (around 12/21/2016) for check with PCP.  Educational handout given for COPD  Terald Sleeper PA-C [facility] [address]  [place], [place] [address] [phone]   12/07/2016, 10:20 PM

## 2016-12-07 NOTE — Patient Instructions (Signed)
Broncoespasmo - Adultos (Bronchospasm, Adult) Broncoespasmo significa que hay un espasmo o restriccin de las vas areas que llevan el aire a los pulmones. Durante el broncoespasmo, la respiracin se hace ms difcil debido a que las vas respiratorias se contraen. Cuando esto ocurre, puede haber tos, un silbido al respirar (sibilancias) presin en el pecho y dificultad para respirar. Generalmente se asocia al asma, pero no todos los pacientes que experimentan broncoespasmos sufren asma. CAUSAS La causa del broncoespasmo es la inflamacin o la irritacin de las vas respiratorias. La inflamacin o la irritacin pueden haber sido desencadenadas por:  Set designer (por ejemplo a animales, polen, alimentos y moho). Los alrgenos que causan el broncoespasmo pueden producir sibilancias inmediatamente despus de la exposicin, o varias horas despus.  Infeccin. Se considera que la causa ms frecuente son las infecciones virales.  Ejercicios.  Irritantes (como la polucin, humo de cigarrillos, olores fuertes, Nature conservation officer y vapores de [place]).  Los cambios climticos. El viento aumenta la cantidad de moho y polen del aire. La lluvia limpia el aire arrastrando las sustancias irritantes. El aire fro puede causar inflamacin.  Estrs y [employer]. Plain.  Tos excesiva durante la noche.  Tos frecuente o intensa durante un resfro comn.  Opresin en el pecho.  Falta de aire. DIAGNSTICO El broncoespasmo se diagnostica con la historia clnica y un examen fsico. En algunos casos se indican otros estudios, como radiografas de trax, para Clinical research associate. TRATAMIENTO  Le indicarn medicamentos por va inhalatoria para abrir las vas respiratorias y ayudarlo a Ambulance person. Los medicamentos se administran por medio de Educational psychologist o Furniture conservator/restorer.  Le administrarn corticoides en los casos de broncoespasmo grave, generalmente cuando se asocia al  asma. INSTRUCCIONES PARA EL CUIDADO EN EL HOGAR  Siempre tenga un plan preparado para pedir asistencia mdica. Sepa cuando debe llamar al mdico y a los servicios de emergencia de su localidad (911 en [place]). Sepa donde puede acceder a un servicio de emergencias.  Tome todos los medicamentos como le indic el mdico.  Si le indicaron el uso de un inhalador o nebulizador, consulte a su mdico para que le explique cmo usarlo correctamente. Siempre use un espaciador con el inhalador, si le indicaron su uso.  Es Manufacturing systems engineer muy tranquilo durante el ataque. Trate de relajarse y respirar ms lentamente.  Controle el ambiente del hogar del siguiente modo:  Cambie el filtro de la calefaccin y del aire acondicionado al menos una vez al mes.  Limite el uso de hogares o estufas a lea.  No fume y nopermita que fumen en su hogar.  Evite la exposicin a perfumes y fragancias.  Elimine las plagas (como cucarachas, ratones) y sus excrementos.  Elimine las plantas si observa moho en ellas.  Mantenga su casa limpia y [place].  Reemplace las alfombras por pisos de [place], baldosas o vinilo. Las alfombras pueden retener las escamas o pelos de los animales y [place].  Use almohadas, mantas y cubre colchones antialrgicos.  Gratiot sbanas y las mantas todas las semanas con agua caliente y squelas con aire caliente.  Use mantas de [place] o algodn.  Lvese las manos con frecuencia. SOLICITE ATENCIN MDICA SI:  Tiene dolores musculares.  Siente dolor en el pecho.  El esputo cambia de un color claro o blanco a un color amarillo, verde, gris o sanguinolento.  El esputo que elimina es ms espeso.  Tiene problemas relacionados con el medicamento que recibe. como urticaria, picazn, hinchazn o  dificultades respiratorias. [place] DE INMEDIATO SI:  [place] sibilancias y la tos, an despus de tomar los medicamentos recetados.  Tiene una creciente  dificultad para respirar.  Siente un dolor intenso en el pecho. ASEGRESE DE QUE:  Comprende estas instrucciones.  Controlar su afeccin.  Recibir ayuda de inmediato si no mejora o si empeora. Esta informacin no tiene Marine scientist el consejo del mdico. Asegrese de hacerle al mdico cualquier pregunta que tenga. Document Released: 01/10/2008 Document Revised: 10/24/2014 Document Reviewed: 03/25/2013 Elsevier Interactive Patient Education  2017 [employer].

## 2016-12-09 ENCOUNTER — Telehealth: Payer: Self-pay | Admitting: Physician Assistant

## 2016-12-09 NOTE — Telephone Encounter (Signed)
Note placed at front desk for pick up, patient aware

## 2016-12-19 ENCOUNTER — Telehealth: Payer: Self-pay | Admitting: Cardiovascular Disease

## 2016-12-19 NOTE — Telephone Encounter (Signed)
OK 

## 2016-12-19 NOTE — Telephone Encounter (Signed)
New Message  Pt voiced wanting to be release from [facility]'s care to be accepted by [facility] in [facility] for closer proximity.

## 2016-12-19 NOTE — Telephone Encounter (Signed)
Left detailed message on VM-ok per [facility]

## 2016-12-20 NOTE — Telephone Encounter (Signed)
Okay with me 

## 2017-01-11 ENCOUNTER — Ambulatory Visit: Payer: PRIVATE HEALTH INSURANCE | Admitting: Physician Assistant

## 2017-01-17 ENCOUNTER — Encounter: Payer: Self-pay | Admitting: Cardiology

## 2017-01-18 IMAGING — DX DG CHEST 2V
2 series · 2 of 2 positions shown · non-contrast
Comparison: 11/04/2015

CLINICAL DATA: Right-sided chest tightness and shortness of breath

EXAM:
CHEST  2 VIEW

[chest pa]
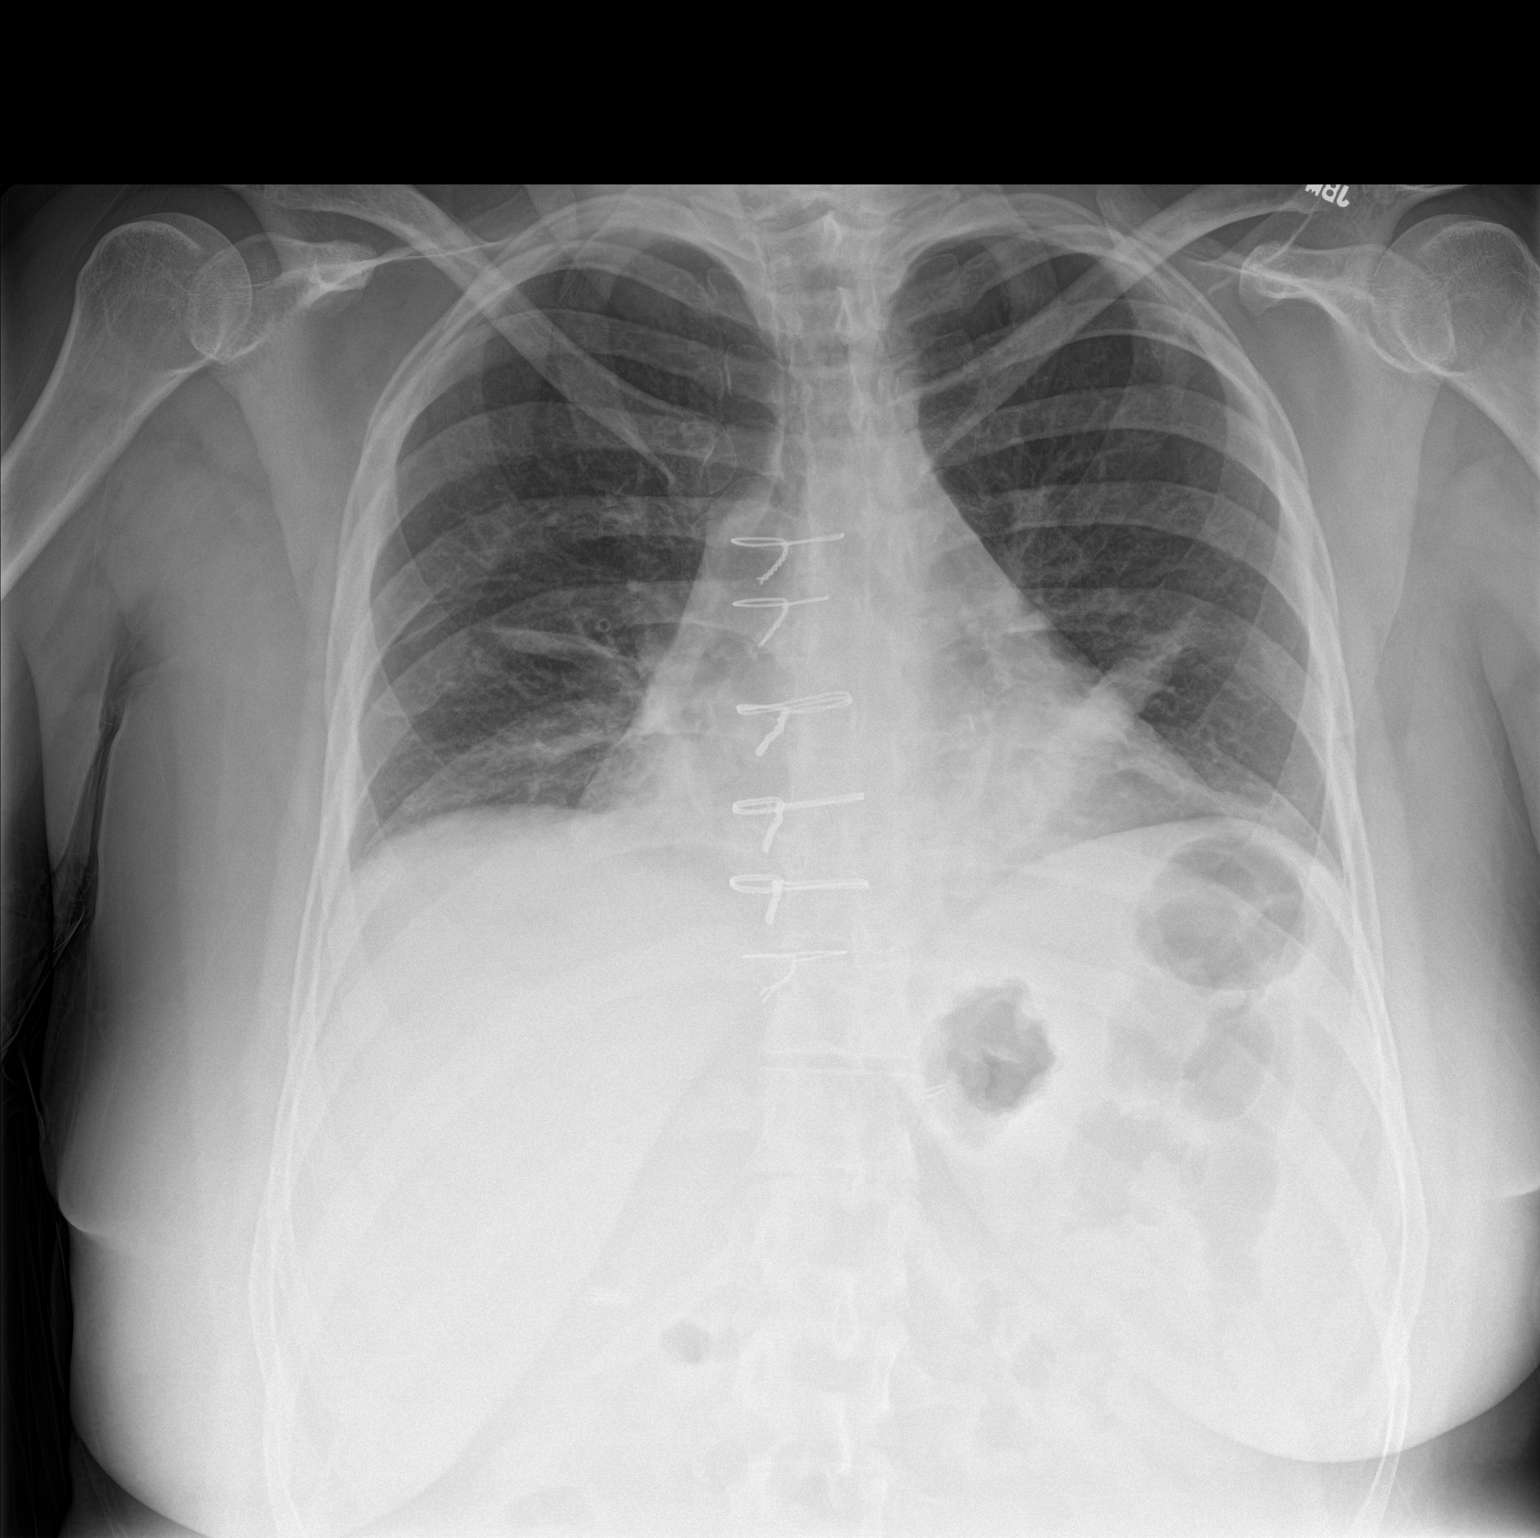

[chest lat]
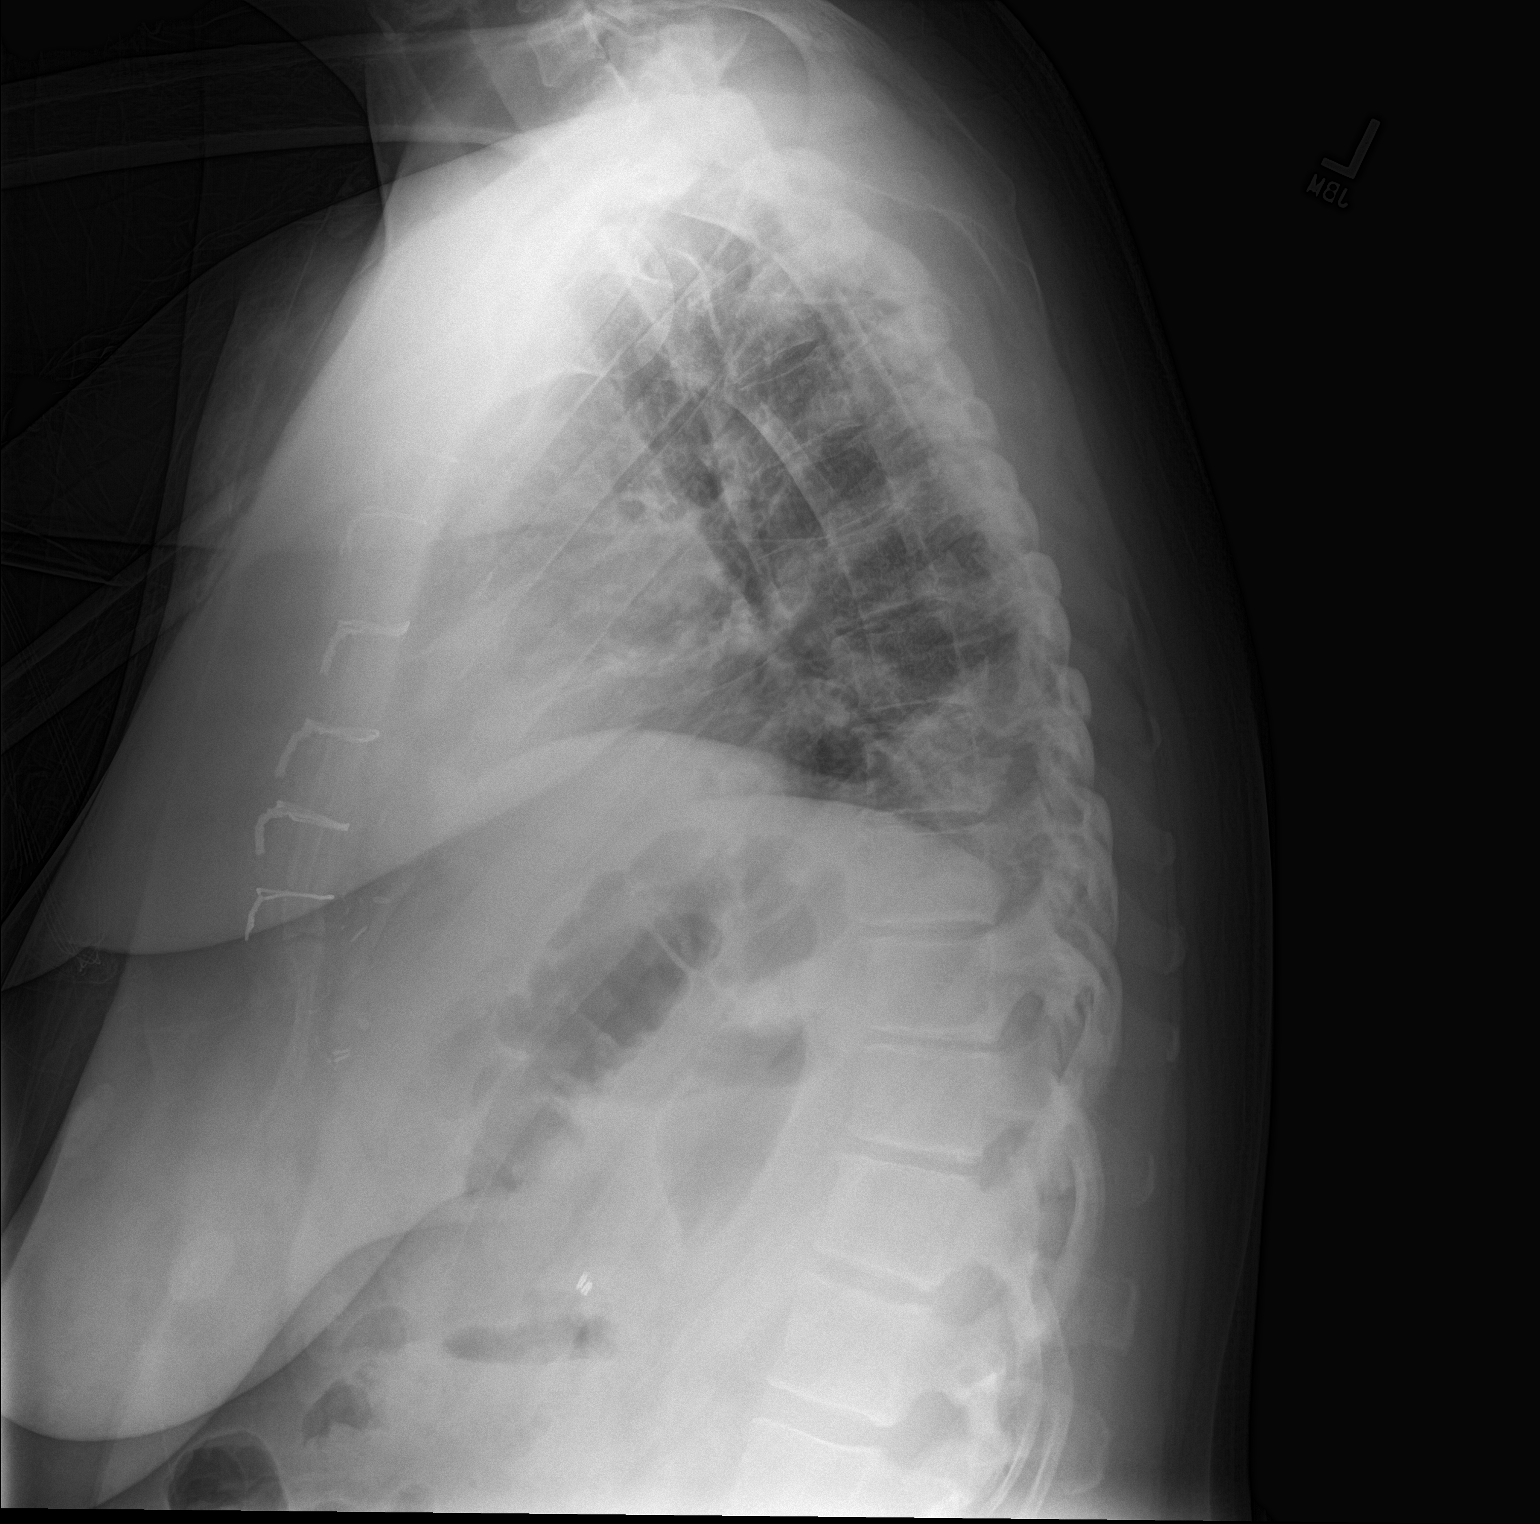

[2 of 2 positions shown; findings below may reference images not displayed]

FINDINGS: Streaky and linear opacity in the lung bases is probably related
atelectasis although pneumonia medial lung bases cannot be entirely
excluded. Opacity seen posteriorly in the costophrenic sulcus on the
lateral film. Lung volumes are low. Patient is status post median
sternotomy. The visualized bony structures of the thorax are intact.
IMPRESSION: Interval decrease in volume with interval development of bibasilar
collapse/ consolidation.

## 2017-01-27 ENCOUNTER — Encounter: Payer: Self-pay | Admitting: Physician Assistant

## 2017-01-27 ENCOUNTER — Ambulatory Visit (INDEPENDENT_AMBULATORY_CARE_PROVIDER_SITE_OTHER): Payer: PRIVATE HEALTH INSURANCE | Admitting: Physician Assistant

## 2017-01-27 VITALS — BP 130/76 | HR 73 | Temp 97.4°F | Ht 68.0 in | Wt 220.8 lb

## 2017-01-27 DIAGNOSIS — Z0000 Encounter for general adult medical examination without abnormal findings: Secondary | ICD-10-CM

## 2017-01-27 DIAGNOSIS — R0683 Snoring: Secondary | ICD-10-CM | POA: Diagnosis not present

## 2017-01-27 DIAGNOSIS — R5383 Other fatigue: Secondary | ICD-10-CM | POA: Insufficient documentation

## 2017-01-27 DIAGNOSIS — R0681 Apnea, not elsewhere classified: Secondary | ICD-10-CM

## 2017-01-27 MED ORDER — TRAMADOL HCL 50 MG PO TABS: 50.0000 mg | ORAL_TABLET | Freq: Three times a day (TID) | ORAL | 5 refills | Status: DC | PRN

## 2017-01-27 NOTE — Patient Instructions (Signed)

## 2017-01-27 NOTE — Progress Notes (Signed)
BP 130/76   Pulse 73   Temp 97.4 F (36.3 C) (Oral)   Ht 5' 8" (1.727 m)   Wt 220 lb 12.8 oz (100.2 kg)   BMI 33.57 kg/m    Subjective:    Patient ID: Brenda Cruz, female    DOB: Sep 18, 1966, 50 y.o.   MRN: [id number]  HPI: Brenda Cruz is a 51 y.o. female presenting on 01/27/2017 for Cough (follow up on cough. 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 About 2 weeks after she stopped the lisinopril the cough greatly improved. She is doing well on her other medications. Her blood pressure readings are very good. The patient does however have still significant fatigue. She never wakes up rested. Her husband is present in the room with her. He reports that she does snore and have apneic events. The patient's willing to go for a sleep apnea test. With her cardiac history I have told her that treating her sleep apnea is of great importance to reduce future risk..   All medications are reviewed today. There are no reports of any problems with the medications. All of the medical conditions are reviewed and updated.  Lab work is reviewed and will be ordered as medically necessary. There are no new problems reported with today's visit.   Relevant past medical, surgical, family and social history reviewed and updated as indicated. Allergies and medications reviewed and updated.  Past Medical History:  Diagnosis Date  . Anxiety   . Asthma   . DDD (degenerative disc disease)   . GERD (gastroesophageal reflux disease)   . Hemorrhoids   . Hyperlipemia   . Hypertension   . Iron deficiency anemia 11/23/2015  . Pulmonary emboli ([place]) 12/07/2015  . TMJ (temporomandibular joint disorder)     Past Surgical History:  Procedure Laterality Date  . CARDIAC CATHETERIZATION N/A 10/29/2015   Procedure: Left Heart Cath and Coronary Angiography;  Surgeon: [name], MD;   Location: [facility];  Service: Cardiovascular;  Laterality: N/A;  . CHOLECYSTECTOMY    . CORONARY ARTERY BYPASS GRAFT N/A 11/02/2015   Procedure: CORONARY ARTERY BYPASS GRAFTING (CABG)x 1 using left internal mammary artery.;  Surgeon: Melrose Nakayama, MD;  Location: [facility];  Service: Open Heart Surgery;  Laterality: N/A;  . LUMBAR FUSION    . TEE WITHOUT CARDIOVERSION N/A 11/02/2015   Procedure: TRANSESOPHAGEAL ECHOCARDIOGRAM (TEE);  Surgeon: Melrose Nakayama, MD;  Location: [facility];  Service: Open Heart Surgery;  Laterality: N/A;    Review of Systems  Constitutional: Positive for fatigue. Negative for activity change and fever.  HENT: Negative.   Eyes: Negative.   Respiratory: Negative.  Negative for cough.   Cardiovascular: Negative.  Negative for chest pain.  Gastrointestinal: Negative.  Negative for abdominal pain.  Endocrine: Negative.   Genitourinary: Negative.  Negative for dysuria.  Musculoskeletal: Negative.   Skin: Negative.   Neurological: Negative.   Psychiatric/Behavioral: Positive for decreased concentration and sleep disturbance. Negative for agitation, confusion and self-injury. The patient is nervous/anxious.      Current Outpatient Prescriptions:  .  acetaminophen (TYLENOL) 500 MG tablet, Take 1,000 mg by mouth every 6 (six) hours as needed for moderate pain., Disp: , Rfl:  .  albuterol (PROVENTIL HFA;VENTOLIN HFA) 108 (90 Base) MCG/ACT inhaler, Inhale 2 puffs into the lungs every 4 (four) hours as needed for wheezing or shortness  of breath., Disp: 1 Inhaler, Rfl: 0 .  amLODipine (NORVASC) 2.5 MG tablet, Take 1 tablet (2.5 mg total) by mouth daily., Disp: 90 tablet, Rfl: 3 .  aspirin 325 MG tablet, Take 325 mg by mouth daily., Disp: , Rfl:  .  clonazePAM (KLONOPIN) 0.5 MG tablet, TAKE ONE TABLET TWICE DAILY AS NEEDED, Disp: 60 tablet, Rfl: 1 .  esomeprazole (NEXIUM) 40 MG capsule, TAKE ONE (1) CAPSULE EACH DAY, Disp: 30 capsule, Rfl: 1 .  ferrous sulfate  325 (65 FE) MG tablet, Take 325 mg by mouth daily as needed (only takes for menses). , Disp: , Rfl:  .  FLUoxetine (PROZAC) 40 MG capsule, Take 1 capsule (40 mg total) by mouth daily., Disp: 30 capsule, Rfl: 11 .  LORazepam (ATIVAN) 1 MG tablet, TAKE 1 TABLET TWICE DAILY AS NEEDED FOR ANXIETY, Disp: 30 tablet, Rfl: 1 .  metoprolol tartrate (LOPRESSOR) 25 MG tablet, Take 1 tablet (25 mg total) by mouth 2 (two) times daily., Disp: 60 tablet, Rfl: 3 .  Multiple Vitamin (MULTIVITAMIN WITH MINERALS) TABS tablet, Take 1 tablet by mouth daily., Disp: , Rfl:  .  Probiotic Product (RA PROBIOTIC GUMMIES) CHEW, Chew 1 each by mouth daily., Disp: , Rfl:  .  traMADol (ULTRAM) 50 MG tablet, Take 1 tablet (50 mg total) by mouth every 8 (eight) hours as needed for moderate pain., Disp: 60 tablet, Rfl: 5     Objective:    BP 130/76   Pulse 73   Temp 97.4 F (36.3 C) (Oral)   Ht 5' 8" (1.727 m)   Wt 220 lb 12.8 oz (100.2 kg)   BMI 33.57 kg/m   Allergies  Allergen Reactions  . Atorvastatin Other (See Comments)    myalgias  . Iohexol Itching, Rash and Other (See Comments)     Code: RASH, Desc: White blisters in mouth during ivp in [facility] '93, ok w/ 13 hour prep today//a.calhoun, Onset Date: [id number]   . 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 Penicillins Other (See Comments)    Has patient had a PCN reaction causing immediate rash, facial/tongue/throat swelling, SOB or lightheadedness with hypotension: No Has patient had a PCN reaction causing severe rash involving mucus membranes or skin necrosis: No Has patient had a PCN reaction that required hospitalization No Has patient had a PCN reaction occurring within the last 10 years: No If all of the above answers are "NO", then may proceed with Cephalosporin use.  . Sulfa Antibiotics Itching and Rash  . Zithromax [Azithromycin] Itching and Rash     Physical Exam  Constitutional: She is oriented to person, place, and time. She  appears well-developed and well-nourished.  HENT:  Head: Normocephalic and atraumatic.  Eyes: Conjunctivae and EOM are normal. Pupils are equal, round, and reactive to light.  Cardiovascular: Normal rate, regular rhythm, normal heart sounds and intact distal pulses.   Pulmonary/Chest: Effort normal and breath sounds normal.  Abdominal: Soft. Bowel sounds are normal.  Neurological: She is alert and oriented to person, place, and time. She has normal reflexes.  Skin: Skin is warm and dry. No rash noted.  Psychiatric: She has a normal mood and affect. Her behavior is normal. Judgment and thought content normal.        Assessment & Plan:   1. Well adult exam - CBC with Differential/Platelet - CMP14+EGFR - Lipid panel - TSH  2. Fatigue, unspecified type - CBC with Differential/Platelet - CMP14+EGFR - TSH -  Ambulatory referral to Sleep Studies  3. Snoring - Ambulatory referral to Sleep Studies  4. Apnea - Ambulatory referral to Sleep Studies   Current Outpatient Prescriptions:  .  acetaminophen (TYLENOL) 500 MG tablet, Take 1,000 mg by mouth every 6 (six) hours as needed for moderate pain., Disp: , Rfl:  .  albuterol (PROVENTIL HFA;VENTOLIN HFA) 108 (90 Base) MCG/ACT inhaler, Inhale 2 puffs into the lungs every 4 (four) hours as needed for wheezing or shortness of breath., Disp: 1 Inhaler, Rfl: 0 .  amLODipine (NORVASC) 2.5 MG tablet, Take 1 tablet (2.5 mg total) by mouth daily., Disp: 90 tablet, Rfl: 3 .  aspirin 325 MG tablet, Take 325 mg by mouth daily., Disp: , Rfl:  .  clonazePAM (KLONOPIN) 0.5 MG tablet, TAKE ONE TABLET TWICE DAILY AS NEEDED, Disp: 60 tablet, Rfl: 1 .  esomeprazole (NEXIUM) 40 MG capsule, TAKE ONE (1) CAPSULE EACH DAY, Disp: 30 capsule, Rfl: 1 .  ferrous sulfate 325 (65 FE) MG tablet, Take 325 mg by mouth daily as needed (only takes for menses). , Disp: , Rfl:  .  FLUoxetine (PROZAC) 40 MG capsule, Take 1 capsule (40 mg total) by mouth daily., Disp: 30  capsule, Rfl: 11 .  LORazepam (ATIVAN) 1 MG tablet, TAKE 1 TABLET TWICE DAILY AS NEEDED FOR ANXIETY, Disp: 30 tablet, Rfl: 1 .  metoprolol tartrate (LOPRESSOR) 25 MG tablet, Take 1 tablet (25 mg total) by mouth 2 (two) times daily., Disp: 60 tablet, Rfl: 3 .  Multiple Vitamin (MULTIVITAMIN WITH MINERALS) TABS tablet, Take 1 tablet by mouth daily., Disp: , Rfl:  .  Probiotic Product (RA PROBIOTIC GUMMIES) CHEW, Chew 1 each by mouth daily., Disp: , Rfl:  .  traMADol (ULTRAM) 50 MG tablet, Take 1 tablet (50 mg total) by mouth every 8 (eight) hours as needed for moderate pain., Disp: 60 tablet, Rfl: 5  Continue all other maintenance medications as listed above.  Follow up plan: No Follow-up on file.  Educational handout given for sleep apnea  Terald Sleeper PA-C [facility] [address]  [place], [place] [address] [phone]   01/27/2017, 11:22 AM

## 2017-01-28 LAB — CMP14+EGFR
ALBUMIN: 3.9 g/dL (ref 3.5–5.5)
ALK PHOS: 85 IU/L (ref 39–117)
ALT: 16 IU/L (ref 0–32)
AST: 29 IU/L (ref 0–40)
Albumin/Globulin Ratio: 1.3 (ref 1.2–2.2)
BILIRUBIN TOTAL: 0.2 mg/dL (ref 0.0–1.2)
BUN / CREAT RATIO: 11 (ref 9–23)
BUN: 11 mg/dL (ref 6–24)
CHLORIDE: 100 mmol/L (ref 96–106)
CO2: 22 mmol/L (ref 18–29)
Calcium: 8.8 mg/dL (ref 8.7–10.2)
Creatinine, Ser: 1.02 mg/dL — ABNORMAL HIGH (ref 0.57–1.00)
GFR calc Af Amer: 75 mL/min/{1.73_m2} (ref >59)
GFR calc non Af Amer: 65 mL/min/{1.73_m2} (ref >59)
GLUCOSE: 126 mg/dL — AB (ref 65–99)
Globulin, Total: 3 g/dL (ref 1.5–4.5)
Potassium: 4.9 mmol/L (ref 3.5–5.2)
SODIUM: 140 mmol/L (ref 134–144)
Total Protein: 6.9 g/dL (ref 6.0–8.5)

## 2017-01-28 LAB — LIPID PANEL
CHOL/HDL RATIO: 5.3 ratio — AB (ref 0.0–4.4)
Cholesterol, Total: 168 mg/dL (ref 100–199)
HDL: 32 mg/dL — AB (ref >39)
LDL Calculated: 87 mg/dL (ref 0–99)
Triglycerides: 246 mg/dL — ABNORMAL HIGH (ref 0–149)
VLDL CHOLESTEROL CAL: 49 mg/dL — AB (ref 5–40)

## 2017-01-28 LAB — CBC WITH DIFFERENTIAL/PLATELET
BASOS ABS: 0.1 10*3/uL (ref 0.0–0.2)
Basos: 1 %
EOS (ABSOLUTE): 0.3 10*3/uL (ref 0.0–0.4)
Eos: 4 %
HEMOGLOBIN: 12.3 g/dL (ref 11.1–15.9)
Hematocrit: 37.5 % (ref 34.0–46.6)
Immature Grans (Abs): 0 10*3/uL (ref 0.0–0.1)
Immature Granulocytes: 0 %
LYMPHS ABS: 2.4 10*3/uL (ref 0.7–3.1)
Lymphs: 25 %
MCH: 29.6 pg (ref 26.6–33.0)
MCHC: 32.8 g/dL (ref 31.5–35.7)
MCV: 90 fL (ref 79–97)
MONOCYTES: 9 %
MONOS ABS: 0.9 10*3/uL (ref 0.1–0.9)
NEUTROS ABS: 6.1 10*3/uL (ref 1.4–7.0)
Neutrophils: 61 %
Platelets: 271 10*3/uL (ref 150–379)
RBC: 4.15 x10E6/uL (ref 3.77–5.28)
RDW: 14.8 % (ref 12.3–15.4)
WBC: 9.8 10*3/uL (ref 3.4–10.8)

## 2017-01-28 LAB — TSH: TSH: 1.1 u[IU]/mL (ref 0.450–4.500)

## 2017-01-29 NOTE — Progress Notes (Signed)
Cardiology Office Note   Date:  02/02/2017   ID:  Brenda Cruz, DOB 1966-06-09, MRN [id number]  PCP:  [name], [name]  Cardiologist:   Minus Breeding, MD    Chief Complaint  Patient presents with  . Coronary Artery Disease      History of Present Illness: Brenda Cruz is a 51 y.o. female who presents for follow-up of coronary disease. She has strong family history and some atypical chest pain in early 2017. She had cardiac catheterization ostial left main stenosis and had a LIMA to the LAD. I reviewed these records and the films myself for this visit. She was being followed by Dr. Gwenlyn Found but lives here in [place]. She did have a previous smoking history. 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  Of note she had very atypical symptoms when she presented with slight presyncopal sensation at work followed by dyspnea. She says she's had that one more time about a month ago where she felt a slight episode of presyncope like people were disappearing from her. She's not had any frank syncope. She's not had any chest pressure, neck or arm discomfort. She's not had any palpitations. She's never really had any arm or neck discomfort or other classical symptoms. She's not had any shortness of breath that she was having.   Past Medical History:  Diagnosis Date  . Anxiety   . Asthma   . DDD (degenerative disc disease)   . GERD (gastroesophageal reflux disease)   . Hemorrhoids   . 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 Hyperlipemia   . Hypertension   . Iron deficiency anemia 11/23/2015  . Pulmonary emboli ([facility]) 12/07/2015  . TMJ (temporomandibular joint disorder)     Past Surgical History:  Procedure Laterality Date  . CARDIAC CATHETERIZATION N/A 10/29/2015   Procedure: Left Heart Cath and Coronary Angiography;  Surgeon: [name], MD;  Location: [facility];  Service: Cardiovascular;  Laterality: N/A;  . CHOLECYSTECTOMY    . CORONARY ARTERY BYPASS GRAFT N/A 11/02/2015   Procedure: CORONARY ARTERY BYPASS GRAFTING (CABG)x 1 using left internal mammary artery.;  Surgeon: Melrose Nakayama, MD;  Location: [facility];  Service: Open Heart Surgery;  Laterality: N/A;  . LUMBAR FUSION    . TEE WITHOUT CARDIOVERSION N/A 11/02/2015   Procedure: TRANSESOPHAGEAL ECHOCARDIOGRAM (TEE);  Surgeon: Melrose Nakayama, MD;  Location: [facility];  Service: Open Heart Surgery;  Laterality: N/A;     Current Outpatient Prescriptions  Medication Sig Dispense Refill  . acetaminophen (TYLENOL) 500 MG tablet Take 1,000 mg by mouth every 6 (six) hours as needed for moderate pain.    Marland Kitchen albuterol (PROVENTIL HFA;VENTOLIN HFA) 108 (90 Base) MCG/ACT inhaler Inhale 2 puffs into the lungs every 4 (four) hours as needed for wheezing or shortness of breath. 1 Inhaler 0  . amLODipine (NORVASC) 2.5 MG tablet Take 1 tablet (2.5 mg total) by mouth daily. 90 tablet 3  . aspirin 81 MG tablet Take 1 tablet (81 mg total) by mouth daily.    . clonazePAM (KLONOPIN) 0.5 MG tablet Take 0.5 mg by mouth 2 (two) times daily as needed for anxiety.    Marland Kitchen esomeprazole (NEXIUM) 40 MG capsule Take 40 mg by mouth daily at 12 noon.    . ferrous sulfate 325 (65 FE) MG tablet Take 325  mg by mouth daily as needed (only takes for menses).     Marland Kitchen FLUoxetine (PROZAC) 40 MG capsule Take 1 capsule (40 mg total) by mouth daily. 30 capsule 11  . LORazepam (ATIVAN) 1 MG tablet Take 1 mg by mouth 2 (two) times daily as needed for anxiety.    . metoprolol tartrate (LOPRESSOR) 25 MG tablet Take 12.5 mg by mouth 2 (two) times daily.    . Multiple Vitamin (MULTIVITAMIN WITH MINERALS) TABS tablet Take 1 tablet by mouth daily.    . Probiotic Product (RA PROBIOTIC GUMMIES) CHEW Chew 1 each by mouth daily.    . rosuvastatin (CRESTOR) 40 MG tablet Take 1 tablet (40 mg total) by mouth daily. 30 tablet 11  . traMADol (ULTRAM)  50 MG tablet Take 1 tablet (50 mg total) by mouth every 8 (eight) hours as needed for moderate pain. 60 tablet 5   No current facility-administered medications for this visit.     Allergies:   Atorvastatin; Iohexol; Heparin; Penicillins; Sulfa antibiotics; and Zithromax [azithromycin]    ROS:  Please see the history of present illness.   Otherwise, review of systems are positive for none.   All other systems are reviewed and negative.    PHYSICAL EXAM: VS:  BP 140/82   Pulse 65   Ht 5\' 8"  (1.727 m)   Wt 222 lb (100.7 kg)   BMI 33.75 kg/m  , BMI Body mass index is 33.75 kg/m. GENERAL:  Well appearing NECK:  No jugular venous distention, waveform within normal limits, carotid upstroke brisk and symmetric, no bruits, no thyromegaly LYMPHATICS:  No cervical, inguinal adenopathy LUNGS:  Clear to auscultation bilaterally BACK:  No CVA tenderness CHEST:  Well healed sternotomy scar. HEART:  PMI not displaced or sustained,S1 and S2 within normal limits, no S3, no S4, no clicks, no rubs, no murmurs ABD:  Flat, positive bowel sounds normal in frequency in pitch, no bruits, no rebound, no guarding, no midline pulsatile mass, no hepatomegaly, no splenomegaly EXT:  2 plus pulses throughout, no edema, no cyanosis no clubbing   EKG:  EKG is ordered today. The ekg ordered today demonstrates sinus rhythm, rate 65, axis within normal limits, intervals within normal limits, no acute ST-T wave changes.   Recent Labs: 04/18/2016: Hemoglobin 10.8 01/27/2017: ALT 16; BUN 11; Creatinine, Ser 1.02; Platelets 271; Potassium 4.9; Sodium 140; TSH 1.100    Lipid Panel    Component Value Date/Time   CHOL 168 01/27/2017 0858   TRIG 246 (H) 01/27/2017 0858   HDL 32 (L) 01/27/2017 0858   CHOLHDL 5.3 (H) 01/27/2017 0858   CHOLHDL 8.1 10/30/2015 0300   VLDL 29 10/30/2015 0300   LDLCALC 87 01/27/2017 0858      Wt Readings from Last 3 Encounters:  02/01/17 222 lb (100.7 kg)  01/27/17 220 lb 12.8 oz  (100.2 kg)  12/07/16 220 lb 12.8 oz (100.2 kg)      Other studies Reviewed: Additional studies/ records that were reviewed today include: Cath films, labs. Review of the above records demonstrates:  Please see elsewhere in the note.     ASSESSMENT AND PLAN:  CAD:   She has very atypical symptoms. I will bring the patient back for a POET (Plain Old Exercise Test). This will allow me to screen for obstructive coronary disease, risk stratify and very importantly provide a prescription for exercise.  She will reduce her aspirin 81 mg daily.  DYSLIPIDEMIA:    She is not quite at target with  her LDL I will increase her Crestor to 40 mg daily. She needs lipid profile 8 weeks.   Current medicines are reviewed at length with the patient today.  The patient does not have concerns regarding medicines.  The following changes have been made:  As above  Labs/ tests ordered today include:   Orders Placed This Encounter  Procedures  . Exercise Tolerance Test  . EKG 12-Lead     Disposition:   FU with me in six months.      Signed, Minus Breeding, MD  02/02/2017 8:15 AM    [facility]

## 2017-02-01 ENCOUNTER — Ambulatory Visit (INDEPENDENT_AMBULATORY_CARE_PROVIDER_SITE_OTHER): Payer: PRIVATE HEALTH INSURANCE | Admitting: Cardiology

## 2017-02-01 ENCOUNTER — Encounter: Payer: Self-pay | Admitting: Cardiology

## 2017-02-01 VITALS — BP 140/82 | HR 65 | Ht 68.0 in | Wt 222.0 lb

## 2017-02-01 DIAGNOSIS — I25119 Atherosclerotic heart disease of native coronary artery with unspecified angina pectoris: Secondary | ICD-10-CM

## 2017-02-01 DIAGNOSIS — E785 Hyperlipidemia, unspecified: Secondary | ICD-10-CM

## 2017-02-01 MED ORDER — ROSUVASTATIN CALCIUM 40 MG PO TABS
40.0000 mg | ORAL_TABLET | Freq: Every day | ORAL | 11 refills | Status: DC
Start: 2017-02-01 — End: 2017-12-12

## 2017-02-01 MED ORDER — ASPIRIN 81 MG PO TABS: 81.0000 mg | ORAL_TABLET | Freq: Every day | ORAL | Status: DC

## 2017-02-01 NOTE — Patient Instructions (Signed)
Medication Instructions:  Please decrease your ASA to 81 mg a day. Increase your Crestor to 40 mg a day. Continue all other medications as listed.  Testing/Procedures: Your physician has requested that you have an exercise tolerance test. For further information please visit [email or web address]. Please also follow instruction sheet, as given.  Follow-Up: Follow up in 6 month with Dr. Percival Spanish.  You will receive a letter in the mail 2 months before you are due.  Please call us when you receive this letter to schedule your follow up appointment.  If you need a refill on your cardiac medications before your next appointment, please call your pharmacy.  Thank you for choosing [facility]!!

## 2017-02-02 ENCOUNTER — Encounter: Payer: Self-pay | Admitting: Cardiology

## 2017-02-10 ENCOUNTER — Ambulatory Visit (INDEPENDENT_AMBULATORY_CARE_PROVIDER_SITE_OTHER): Payer: PRIVATE HEALTH INSURANCE

## 2017-02-10 DIAGNOSIS — I25119 Atherosclerotic heart disease of native coronary artery with unspecified angina pectoris: Secondary | ICD-10-CM

## 2017-02-10 LAB — EXERCISE TOLERANCE TEST
CHL CUP MPHR: 172 {beats}/min
CSEPEDS: 57 s
CSEPHR: 75 %
Estimated workload: 7 METS
Exercise duration (min): 5 min
Peak HR: 130 {beats}/min
RPE: 17
Rest HR: 70 {beats}/min

## 2017-02-21 ENCOUNTER — Other Ambulatory Visit: Payer: Self-pay | Admitting: Nurse Practitioner

## 2017-02-23 NOTE — Telephone Encounter (Signed)
Is she on both?

## 2017-02-23 NOTE — Telephone Encounter (Signed)
Patient should not be on both  Of these. Which one is she taking

## 2017-02-23 NOTE — Telephone Encounter (Signed)
I know we have discussed bfore but I cannot remember why she is taking both. Not sureoffice policies are going to allow her to be on both

## 2017-02-23 NOTE — Telephone Encounter (Signed)
Patient states she is taking both medications.

## 2017-02-24 MED ORDER — LORAZEPAM 1 MG PO TABS: 1.0000 mg | ORAL_TABLET | Freq: Two times a day (BID) | ORAL | 1 refills | Status: DC | PRN

## 2017-02-24 NOTE — Telephone Encounter (Signed)
Patient aware and verbalizes understanding. States she was on both because she had had open heart surgery and it was causing her to have bad night terriers where she would wake up screaming. Patient states she would rather be on Ativan since she has been on it for so long. States she will do whatever you would like her to do.

## 2017-02-24 NOTE — Telephone Encounter (Signed)
Please call in ativan 1mg  1 po BID #60 with 1 refills

## 2017-02-24 NOTE — Telephone Encounter (Signed)
Rx called in. Patient aware.  

## 2017-02-24 NOTE — Addendum Note (Signed)
Addended by: [name] on: 02/24/2017 01:19 PM   Modules accepted: Orders

## 2017-02-27 ENCOUNTER — Institutional Professional Consult (permissible substitution): Payer: PRIVATE HEALTH INSURANCE | Admitting: Neurology

## 2017-03-14 ENCOUNTER — Telehealth: Payer: Self-pay | Admitting: Cardiology

## 2017-03-14 NOTE — Telephone Encounter (Signed)
Called Brenda Cruz in regards to request for sooner appt-reports she has had continued SOB since last OV with Dr. Percival Spanish.  Occurs daily, reports fatigue, intermittent nausea x 1 week. Denies vomiting, diaphoresis, syncope, dizziness.    Per chart review-Brenda Cruz has scheduled for follow up after GXT to discuss her symptoms with Dr. Percival Spanish.    Rescheduled appt to tomorrow 5/30 with Dr. Percival Spanish in [place] (where she usually sees him).  Brenda Cruz aware if symptoms reoccur or worsen to proceed to ER for evaluation.    Brenda Cruz aware and verbalized understanding.

## 2017-03-14 NOTE — Progress Notes (Signed)
Cardiology Office Note   Date:  03/15/2017   ID:  Brenda Cruz, DOB February 14, 1966, MRN [id number]  PCP:  [name], [name]  Cardiologist:   Minus Breeding, MD    Chief Complaint  Patient presents with  . Coronary Artery Disease      History of Present Illness: Brenda Cruz is a 51 y.o. female who presents for follow-up of coronary disease. She has strong family history and some atypical chest pain in early 2017. She had cardiac catheterization with ostial left main stenosis and had a LIMA to the LAD. I reviewed these records and the films myself for this visit. She was being followed by Dr. Gwenlyn Found but lives here in [place]. She did have a previous smoking history. 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  When I first saw her recently she had presyncope and dyspnea.  She had a negative inadequate POET (Plain Old Exercise Treadmill).  She had to stop because of dyspnea after walking 5:57.  There were no ischemic ST T wave changes.  She has had none of the presyncope that she was having.  However, she continues to have episodes of some mild upper burning chest discomfort sporadically and SOB with activity.     Past Medical History:  Diagnosis Date  . Anxiety   . Asthma   . DDD (degenerative disc disease)   . GERD (gastroesophageal reflux disease)   . Hemorrhoids   . HIT (heparin-induced thrombocytopenia) ([place])   . Hyperlipemia   . Hypertension   . Iron deficiency anemia 11/23/2015  . Pulmonary emboli ([place]) 12/07/2015  . TMJ (temporomandibular joint disorder)     Past Surgical History:  Procedure Laterality Date  . CARDIAC CATHETERIZATION N/A 10/29/2015   Procedure: Left Heart Cath and Coronary Angiography;  Surgeon: [name], MD;  Location: [facility];  Service: Cardiovascular;  Laterality: N/A;  . CHOLECYSTECTOMY    . CORONARY ARTERY BYPASS GRAFT N/A  11/02/2015   Procedure: CORONARY ARTERY BYPASS GRAFTING (CABG)x 1 using left internal mammary artery.;  Surgeon: Melrose Nakayama, MD;  Location: [facility];  Service: Open Heart Surgery;  Laterality: N/A;  . LUMBAR FUSION    . TEE WITHOUT CARDIOVERSION N/A 11/02/2015   Procedure: TRANSESOPHAGEAL ECHOCARDIOGRAM (TEE);  Surgeon: Melrose Nakayama, MD;  Location: [facility];  Service: Open Heart Surgery;  Laterality: N/A;     Current Outpatient Prescriptions  Medication Sig Dispense Refill  . acetaminophen (TYLENOL) 500 MG tablet Take 1,000 mg by mouth every 6 (six) hours as needed for moderate pain.    Marland Kitchen albuterol (PROVENTIL HFA;VENTOLIN HFA) 108 (90 Base) MCG/ACT inhaler Inhale 2 puffs into the lungs every 4 (four) hours as needed for wheezing or shortness of breath. 1 Inhaler 0  . amLODipine (NORVASC) 2.5 MG tablet Take 1 tablet (2.5 mg total) by mouth daily. 90 tablet 3  . aspirin 81 MG tablet Take 1 tablet (81 mg total) by mouth daily.    Marland Kitchen esomeprazole (NEXIUM) 40 MG capsule Take 40 mg by mouth daily at 12 noon.    . ferrous sulfate 325 (65 FE) MG tablet Take 325 mg by mouth daily as needed (only takes for menses).     Marland Kitchen FLUoxetine (PROZAC) 40 MG capsule Take 1 capsule (40 mg total) by mouth daily. 30 capsule 11  . LORazepam (ATIVAN)  1 MG tablet Take 1 tablet (1 mg total) by mouth 2 (two) times daily as needed for anxiety. 60 tablet 1  . metoprolol tartrate (LOPRESSOR) 25 MG tablet Take 12.5 mg by mouth 2 (two) times daily.    . Multiple Vitamin (MULTIVITAMIN WITH MINERALS) TABS tablet Take 1 tablet by mouth daily.    . Probiotic Product (RA PROBIOTIC GUMMIES) CHEW Chew 1 each by mouth daily.    . rosuvastatin (CRESTOR) 40 MG tablet Take 1 tablet (40 mg total) by mouth daily. 30 tablet 11  . traMADol (ULTRAM) 50 MG tablet Take 1 tablet (50 mg total) by mouth every 8 (eight) hours as needed for moderate pain. 60 tablet 5   No current facility-administered medications for this visit.      Allergies:   Atorvastatin; Iohexol; Heparin; Penicillins; Sulfa antibiotics; and Zithromax [azithromycin]    ROS:  Please see the history of present illness.   Otherwise, review of systems are positive for none.   All other systems are reviewed and negative.    PHYSICAL EXAM: VS:  BP (!) 160/78   Pulse 76   Ht 5\' 8"  (1.727 m)   Wt 226 lb (102.5 kg)   BMI 34.36 kg/m  , BMI Body mass index is 34.36 kg/m.  GENERAL:  Well appearing NECK:  No jugular venous distention, waveform within normal limits, carotid upstroke brisk and symmetric, no bruits, no thyromegaly LUNGS:  Clear to auscultation bilaterally CHEST:  Unremarkable HEART:  PMI not displaced or sustained,S1 and S2 within normal limits, no S3, no S4, no clicks, no rubs, no murmurs ABD:  Flat, positive bowel sounds normal in frequency in pitch, no bruits, no rebound, no guarding, no midline pulsatile mass, no hepatomegaly, no splenomegaly EXT:  2 plus pulses throughout, no edema, no cyanosis no clubbing   EKG:  EKG is not ordered today.   Recent Labs: 04/18/2016: Hemoglobin 10.8 01/27/2017: ALT 16; BUN 11; Creatinine, Ser 1.02; Platelets 271; Potassium 4.9; Sodium 140; TSH 1.100    Lipid Panel    Component Value Date/Time   CHOL 168 01/27/2017 0858   TRIG 246 (H) 01/27/2017 0858   HDL 32 (L) 01/27/2017 0858   CHOLHDL 5.3 (H) 01/27/2017 0858   CHOLHDL 8.1 10/30/2015 0300   VLDL 29 10/30/2015 0300   LDLCALC 87 01/27/2017 0858      Wt Readings from Last 3 Encounters:  03/15/17 226 lb (102.5 kg)  02/01/17 222 lb (100.7 kg)  01/27/17 220 lb 12.8 oz (100.2 kg)      Other studies Reviewed: Additional studies/ records that were reviewed today include: POET (Plain Old Exercise Treadmill) Review of the above records demonstrates:  See above   ASSESSMENT AND PLAN:  CAD:   She had a recent negative inadequate POET (Plain Old Exercise Treadmill).  She is quite anxious about her symptoms.  We discussed a cath.  She is  very frightened of having this.  I think the pretest probability of new obstructive coronary disease or problems with her graft is not particularly high. However, given her inability to achieve the target heart rate she'll need another screening stress test. I think dobutamine echocardiography would be reasonable. We discussed this and will proceed.  DYSPNEA:  This will be evaluated as above.  If the stress test is negative she will continue with exercise and weight loss and if she has increased dyspnea after being successful with this I would suggest pulmonary referral.  DYSLIPIDEMIA:   I increased the Crestor at  the last visit and will order lipids and liver enzymes in another 4 weeks.    Current medicines are reviewed at length with the patient today.  The patient does not have concerns regarding medicines.  The following changes have been made:   None  Labs/ tests ordered today include:    Orders Placed This Encounter  Procedures  . Lipid panel  . ECHOCARDIOGRAM STRESS TEST     Disposition:   FU with me in based on the results of the above.   Signed, Minus Breeding, MD  03/15/2017 4:00 PM    [place]

## 2017-03-14 NOTE — Telephone Encounter (Signed)
New Message  Pt c/o of Chest Pain: STAT if CP now or developed within 24 hours  1. Are you having CP right now? No  2. Are you experiencing any other symptoms (ex. SOB, nausea, vomiting, sweating)? No  3. How long have you been experiencing CP? 03/10/2017  4. Is your CP continuous or coming and going? Not sure  5. Have you taken Nitroglycerin? Not sure  Pt sent message through mychart for scheduling with MD-Hochrein and the reason for a sooner appt is "Still have symptoms of pressure and discomfort in chest."  Please f/u with pt ?

## 2017-03-15 ENCOUNTER — Encounter: Payer: Self-pay | Admitting: Cardiology

## 2017-03-15 ENCOUNTER — Ambulatory Visit (INDEPENDENT_AMBULATORY_CARE_PROVIDER_SITE_OTHER): Payer: PRIVATE HEALTH INSURANCE | Admitting: Cardiology

## 2017-03-15 VITALS — BP 160/78 | HR 76 | Ht 68.0 in | Wt 226.0 lb

## 2017-03-15 DIAGNOSIS — E785 Hyperlipidemia, unspecified: Secondary | ICD-10-CM | POA: Diagnosis not present

## 2017-03-15 DIAGNOSIS — R0600 Dyspnea, unspecified: Secondary | ICD-10-CM | POA: Diagnosis not present

## 2017-03-15 DIAGNOSIS — I25119 Atherosclerotic heart disease of native coronary artery with unspecified angina pectoris: Secondary | ICD-10-CM | POA: Diagnosis not present

## 2017-03-15 DIAGNOSIS — E1169 Type 2 diabetes mellitus with other specified complication: Secondary | ICD-10-CM | POA: Insufficient documentation

## 2017-03-15 NOTE — Patient Instructions (Addendum)
Medication Instructions:  The current medical regimen is effective;  continue present plan and medications.  Please have fasting lipid panel in 4 weeks.  Testing/Procedures: Your physician has requested that you have a dobutamine echocardiogram. For further information please visit [email or web address]. Please follow instruction sheet as given.  Follow-Up: Follow up with Dr Percival Spanish after the above testing.  If you need a refill on your cardiac medications before your next appointment, please call your pharmacy.  Thank you for choosing [facility]!!

## 2017-03-24 ENCOUNTER — Telehealth: Payer: Self-pay | Admitting: Cardiology

## 2017-03-24 NOTE — Telephone Encounter (Signed)
New message  Pt called requesting to speak with RN. Pt states he appt for 6/13 should be schedule after appts on 6/19. Pt would like to schedule with Dr. Percival Spanish. Please call back to discuss

## 2017-03-24 NOTE — Telephone Encounter (Signed)
Message sent to scheduler. 

## 2017-03-24 NOTE — Telephone Encounter (Signed)
Follow up scheduled

## 2017-03-29 ENCOUNTER — Ambulatory Visit: Payer: PRIVATE HEALTH INSURANCE | Admitting: Cardiology

## 2017-03-30 ENCOUNTER — Telehealth (HOSPITAL_COMMUNITY): Payer: Self-pay | Admitting: *Deleted

## 2017-03-30 NOTE — Telephone Encounter (Signed)
Patient given detailed instructions per Stress Test Requisition Sheet for test on 04/04/17 at 2:30.Patient Notified to arrive 30 minutes early, and that it is imperative to arrive on time for appointment to keep from having the test rescheduled.  Patient verbalized understanding. Veronia Beets

## 2017-04-04 ENCOUNTER — Ambulatory Visit (HOSPITAL_COMMUNITY): Payer: PRIVATE HEALTH INSURANCE | Attending: Cardiovascular Disease

## 2017-04-04 ENCOUNTER — Ambulatory Visit (HOSPITAL_COMMUNITY): Payer: PRIVATE HEALTH INSURANCE

## 2017-04-04 DIAGNOSIS — I25119 Atherosclerotic heart disease of native coronary artery with unspecified angina pectoris: Secondary | ICD-10-CM | POA: Diagnosis present

## 2017-04-04 DIAGNOSIS — R0600 Dyspnea, unspecified: Secondary | ICD-10-CM | POA: Diagnosis not present

## 2017-04-04 MED ORDER — SODIUM CHLORIDE 0.9 % IV SOLN
10.0000 ug/kg/min | INTRAVENOUS | Status: AC
  Administered 2017-04-04: 30 ug/kg/min via INTRAVENOUS

## 2017-04-24 ENCOUNTER — Encounter: Payer: Self-pay | Admitting: Family

## 2017-04-24 ENCOUNTER — Ambulatory Visit (INDEPENDENT_AMBULATORY_CARE_PROVIDER_SITE_OTHER): Payer: PRIVATE HEALTH INSURANCE | Admitting: Family

## 2017-04-24 VITALS — BP 120/73 | HR 73 | Temp 98.9°F | Ht 68.0 in | Wt 224.6 lb

## 2017-04-24 DIAGNOSIS — J029 Acute pharyngitis, unspecified: Secondary | ICD-10-CM | POA: Diagnosis not present

## 2017-04-24 LAB — CULTURE, GROUP A STREP

## 2017-04-24 LAB — RAPID STREP SCREEN (MED CTR MEBANE ONLY): STREP GP A AG, IA W/REFLEX: NEGATIVE

## 2017-04-24 MED ORDER — CEPHALEXIN 500 MG PO CAPS: 500.0000 mg | ORAL_CAPSULE | Freq: Two times a day (BID) | ORAL | 0 refills | Status: AC

## 2017-04-24 NOTE — Patient Instructions (Signed)

## 2017-04-24 NOTE — Progress Notes (Signed)
Subjective:    Patient ID: Brenda Cruz, female    DOB: 1966-04-09, 51 y.o.   MRN: [id number]  PT presents to the office today with sore throat and right rib pain that started one week ago that has gradually worsen.   Fever   Associated symptoms include congestion, coughing, ear pain and headaches.  Sore Throat   This is a new problem. The current episode started 1 to 4 weeks ago. The problem has been gradually worsening. The pain is worse on the left side. The maximum temperature recorded prior to her arrival was 100.4 - 100.9 F. The pain is at a severity of 6/10. The pain is mild. Associated symptoms include congestion, coughing, ear pain, headaches, a hoarse voice, shortness of breath, swollen glands and trouble swallowing. Pertinent negatives include no ear discharge or plugged ear sensation. She has tried acetaminophen and NSAIDs for the symptoms. The treatment provided mild relief.      Review of Systems  Constitutional: Positive for fever.  HENT: Positive for congestion, ear pain, hoarse voice and trouble swallowing. Negative for ear discharge.   Respiratory: Positive for cough and shortness of breath.   Neurological: Positive for headaches.  All other systems reviewed and are negative.      Objective:   Physical Exam  Constitutional: She is oriented to person, place, and time. She appears well-developed and well-nourished. No distress.  HENT:  Head: Normocephalic and atraumatic.  Right Ear: External ear normal.  Left Ear: External ear normal.  Mouth/Throat: Posterior oropharyngeal edema and posterior oropharyngeal erythema present.  Eyes: Pupils are equal, round, and reactive to light.  Neck: Normal range of motion. Neck supple. No thyromegaly present.  Cardiovascular: Normal rate, regular rhythm, normal heart sounds and intact distal pulses.   No murmur heard. Pulmonary/Chest: Effort normal and breath sounds normal. No respiratory distress. She has no wheezes.    Intermittent nonproductive cough  Abdominal: Soft. Bowel sounds are normal. She exhibits no distension. There is no tenderness.  Musculoskeletal: Normal range of motion. She exhibits no edema or tenderness.  Lymphadenopathy:    She has cervical adenopathy (left).  Neurological: She is alert and oriented to person, place, and time.  Skin: Skin is warm and dry.  Psychiatric: She has a normal mood and affect. Her behavior is normal. Judgment and thought content normal.  Vitals reviewed.   BP 120/73   Pulse 73   Temp 98.9 F (37.2 C) (Oral)   Ht 5\' 8"  (1.727 m)   Wt 224 lb 9.6 oz (101.9 kg)   BMI 34.15 kg/m      Assessment & Plan:  1. Sore throat - Rapid strep screen (not at [facility]) - cephALEXin (KEFLEX) 500 MG capsule; Take 1 capsule (500 mg total) by mouth 2 (two) times daily.  Dispense: 20 capsule; Refill: 0  2. Acute pharyngitis, unspecified etiology - Take meds as prescribed - Use a cool mist humidifier  -Use saline nose sprays frequently -Saline irrigations of the nose can be very helpful if done frequently.  * 4X daily for 1 week*  * Use of a nettie pot can be helpful with this. Follow directions with this* -Force fluids -For any cough or congestion  Use plain Mucinex- regular strength or max strength is fine   * Children- consult with Pharmacist for dosing -For fever or aces or pains- take tylenol or ibuprofen appropriate for age and weight.  * for fevers greater than 101 orally you may alternate ibuprofen and tylenol every  3 hours. -Throat lozenges if help -New toothbrush in 3 days - cephALEXin (KEFLEX) 500 MG capsule; Take 1 capsule (500 mg total) by mouth 2 (two) times daily.  Dispense: 20 capsule; Refill: 0   Evelina Dun, [name]

## 2017-04-26 ENCOUNTER — Ambulatory Visit: Payer: PRIVATE HEALTH INSURANCE | Admitting: Cardiology

## 2017-05-02 ENCOUNTER — Other Ambulatory Visit: Payer: Self-pay | Admitting: Cardiovascular Disease

## 2017-05-02 ENCOUNTER — Other Ambulatory Visit: Payer: Self-pay | Admitting: Nurse Practitioner

## 2017-05-02 ENCOUNTER — Telehealth: Payer: Self-pay | Admitting: Family

## 2017-05-02 ENCOUNTER — Other Ambulatory Visit: Payer: Self-pay | Admitting: *Deleted

## 2017-05-02 MED ORDER — CLINDAMYCIN HCL 300 MG PO CAPS: 300.0000 mg | ORAL_CAPSULE | Freq: Three times a day (TID) | ORAL | 0 refills | Status: DC

## 2017-05-02 MED ORDER — ALBUTEROL SULFATE HFA 108 (90 BASE) MCG/ACT IN AERS: 2.0000 | INHALATION_SPRAY | RESPIRATORY_TRACT | 0 refills | Status: DC | PRN

## 2017-05-02 NOTE — Telephone Encounter (Signed)
Pt notified of RX Verbalizes understanding 

## 2017-05-02 NOTE — Progress Notes (Signed)
Pt also requesting refill on Albuterol inhaler Refill ok per [facility]

## 2017-05-02 NOTE — Telephone Encounter (Signed)
Clindamycin Prescription sent to pharmacy. New toothbrush in 3 days.

## 2017-05-08 ENCOUNTER — Encounter: Payer: Self-pay | Admitting: Family

## 2017-05-08 ENCOUNTER — Ambulatory Visit (INDEPENDENT_AMBULATORY_CARE_PROVIDER_SITE_OTHER): Payer: PRIVATE HEALTH INSURANCE | Admitting: Family

## 2017-05-08 ENCOUNTER — Ambulatory Visit (INDEPENDENT_AMBULATORY_CARE_PROVIDER_SITE_OTHER): Payer: PRIVATE HEALTH INSURANCE

## 2017-05-08 VITALS — BP 120/73 | HR 75 | Temp 97.1°F | Ht 68.0 in | Wt 225.0 lb

## 2017-05-08 DIAGNOSIS — R079 Chest pain, unspecified: Secondary | ICD-10-CM

## 2017-05-08 DIAGNOSIS — R05 Cough: Secondary | ICD-10-CM

## 2017-05-08 DIAGNOSIS — R5383 Other fatigue: Secondary | ICD-10-CM | POA: Diagnosis not present

## 2017-05-08 DIAGNOSIS — Z951 Presence of aortocoronary bypass graft: Secondary | ICD-10-CM

## 2017-05-08 DIAGNOSIS — R059 Cough, unspecified: Secondary | ICD-10-CM

## 2017-05-08 DIAGNOSIS — J441 Chronic obstructive pulmonary disease with (acute) exacerbation: Secondary | ICD-10-CM | POA: Diagnosis not present

## 2017-05-08 DIAGNOSIS — R0602 Shortness of breath: Secondary | ICD-10-CM | POA: Diagnosis not present

## 2017-05-08 DIAGNOSIS — J020 Streptococcal pharyngitis: Secondary | ICD-10-CM | POA: Diagnosis not present

## 2017-05-08 MED ORDER — PREDNISONE 10 MG (21) PO TBPK: ORAL_TABLET | ORAL | 0 refills | Status: DC

## 2017-05-08 NOTE — Progress Notes (Signed)
Subjective:    Patient ID: Brenda Cruz, female    DOB: 10/26/1965, 51 y.o.   MRN: [id number]  Pt presents to the office today with SOB, chest pain, and cough. PT was treated for strep on 04/24/17 and 05/02/17. Pt's strep was negative, but husband and son both tested positive for strep over the last week. PT currently taking clindamycin 300 mg TID, but continues to have chills, SOB, and fatigue.   PT has hx of CABG and is followed by Cardiologists every 3 months.  Cough  This is a new problem. The current episode started 1 to 4 weeks ago. The cough is productive of sputum. Associated symptoms include chills, ear pain, headaches, nasal congestion, a sore throat, shortness of breath and wheezing. Pertinent negatives include no ear congestion, fever, myalgias or rhinorrhea. The symptoms are aggravated by stress. She has tried rest (antibiotic) for the symptoms. The treatment provided mild relief. Her past medical history is significant for COPD.      Review of Systems  Constitutional: Positive for chills. Negative for fever.  HENT: Positive for ear pain and sore throat. Negative for rhinorrhea.   Respiratory: Positive for cough, shortness of breath and wheezing.   Musculoskeletal: Negative for myalgias.  Neurological: Positive for headaches.  All other systems reviewed and are negative.      Objective:   Physical Exam  Constitutional: She is oriented to person, place, and time. She appears well-developed and well-nourished. No distress.  HENT:  Head: Normocephalic and atraumatic.  Right Ear: External ear normal.  Left Ear: External ear normal.  Nose: Nose normal.  Mouth/Throat: Oropharynx is clear and moist.  Eyes: Pupils are equal, round, and reactive to light.  Neck: Normal range of motion. Neck supple. No thyromegaly present.  Cardiovascular: Normal rate, regular rhythm, normal heart sounds and intact distal pulses.   No murmur heard. Pulmonary/Chest: Effort normal. No  respiratory distress. She has decreased breath sounds. She has no wheezes.  Abdominal: Soft. Bowel sounds are normal. She exhibits no distension. There is no tenderness.  Musculoskeletal: Normal range of motion. She exhibits no edema or tenderness.  Neurological: She is alert and oriented to person, place, and time.  Skin: Skin is warm and dry.  Psychiatric: Her behavior is normal. Judgment and thought content normal. Her mood appears anxious.  Vitals reviewed.   BP 120/73   Pulse 75   Temp (!) 97.1 F (36.2 C) (Oral)   Ht 5\' 8"  (1.727 m)   Wt 225 lb (102.1 kg)   SpO2 98%   BMI 34.21 kg/m      Assessment & Plan:  1. COPD exacerbation ([place]) - DG Chest 2 View; Future - predniSONE (STERAPRED UNI-PAK 21 TAB) 10 MG (21) TBPK tablet; Use as directed  Dispense: 21 tablet; Refill: 0 - CBC with Differential/Platelet  2. SOB (shortness of breath) - DG Chest 2 View; Future - EKG 12-Lead - predniSONE (STERAPRED UNI-PAK 21 TAB) 10 MG (21) TBPK tablet; Use as directed  Dispense: 21 tablet; Refill: 0 - CBC with Differential/Platelet  3. Chest pain, unspecified type - DG Chest 2 View; Future - EKG 12-Lead - predniSONE (STERAPRED UNI-PAK 21 TAB) 10 MG (21) TBPK tablet; Use as directed  Dispense: 21 tablet; Refill: 0 - CBC with Differential/Platelet  4. Strep throat - predniSONE (STERAPRED UNI-PAK 21 TAB) 10 MG (21) TBPK tablet; Use as directed  Dispense: 21 tablet; Refill: 0 - CBC with Differential/Platelet  5. Cough - DG Chest 2 View; Future -  predniSONE (STERAPRED UNI-PAK 21 TAB) 10 MG (21) TBPK tablet; Use as directed  Dispense: 21 tablet; Refill: 0 - CBC with Differential/Platelet  6. S/P CABG x 1 - EKG 12-Lead - predniSONE (STERAPRED UNI-PAK 21 TAB) 10 MG (21) TBPK tablet; Use as directed  Dispense: 21 tablet; Refill: 0 - CBC with Differential/Platelet  7. Other fatigue - EKG 12-Lead - predniSONE (STERAPRED UNI-PAK 21 TAB) 10 MG (21) TBPK tablet; Use as directed  Dispense:  21 tablet; Refill: 0 - CBC with Differential/Platelet  Continue Clindamycin  Start prednisone Follow up with Cardiologists Pt has hx of CABG and PE- If any SOB, chest pain go to ED!!!!  Evelina Dun, [name]

## 2017-05-08 NOTE — Patient Instructions (Signed)
Cough, Adult Coughing is a reflex that clears your throat and your airways. Coughing helps to heal and protect your lungs. It is normal to cough occasionally, but a cough that happens with other symptoms or lasts a long time may be a sign of a condition that needs treatment. A cough may last only 2-3 weeks (acute), or it may last longer than 8 weeks (chronic). What are the causes? Coughing is commonly caused by:  Breathing in substances that irritate your lungs.  A viral or bacterial respiratory infection.  Allergies.  Asthma.  Postnasal drip.  Smoking.  Acid backing up from the stomach into the esophagus (gastroesophageal reflux).  Certain medicines.  Chronic lung problems, including COPD (or rarely, lung cancer).  Other medical conditions such as heart failure.  Follow these instructions at home: Pay attention to any changes in your symptoms. Take these actions to help with your discomfort:  Take medicines only as told by your health care provider. ? If you were prescribed an antibiotic medicine, take it as told by your health care provider. Do not stop taking the antibiotic even if you start to feel better. ? Talk with your health care provider before you take a cough suppressant medicine.  Drink enough fluid to keep your urine clear or pale yellow.  If the air is dry, use a cold steam vaporizer or humidifier in your bedroom or your home to help loosen secretions.  Avoid anything that causes you to cough at work or at home.  If your cough is worse at night, try sleeping in a semi-upright position.  Avoid cigarette smoke. If you smoke, quit smoking. If you need help quitting, ask your health care provider.  Avoid caffeine.  Avoid alcohol.  Rest as needed.  Contact a health care provider if:  You have new symptoms.  You cough up pus.  Your cough does not get better after 2-3 weeks, or your cough gets worse.  You cannot control your cough with suppressant  medicines and you are losing sleep.  You develop pain that is getting worse or pain that is not controlled with pain medicines.  You have a fever.  You have unexplained weight loss.  You have night sweats. Get help right away if:  You cough up blood.  You have difficulty breathing.  Your heartbeat is very fast. This information is not intended to replace advice given to you by your health care provider. Make sure you discuss any questions you have with your health care provider. Document Released: 04/01/2011 Document Revised: 03/10/2016 Document Reviewed: 12/10/2014 Elsevier Interactive Patient Education  2017 [employer] Inc.  

## 2017-05-09 ENCOUNTER — Other Ambulatory Visit: Payer: Self-pay | Admitting: Family

## 2017-05-09 LAB — CBC WITH DIFFERENTIAL/PLATELET
Basophils Absolute: 0 10*3/uL (ref 0.0–0.2)
Basos: 0 %
EOS (ABSOLUTE): 0.1 10*3/uL (ref 0.0–0.4)
EOS: 1 %
HEMATOCRIT: 33.1 % — AB (ref 34.0–46.6)
Hemoglobin: 11.3 g/dL (ref 11.1–15.9)
Immature Grans (Abs): 0.1 10*3/uL (ref 0.0–0.1)
Immature Granulocytes: 1 %
LYMPHS ABS: 1.9 10*3/uL (ref 0.7–3.1)
Lymphs: 15 %
MCH: 30.1 pg (ref 26.6–33.0)
MCHC: 34.1 g/dL (ref 31.5–35.7)
MCV: 88 fL (ref 79–97)
MONOS ABS: 1 10*3/uL — AB (ref 0.1–0.9)
Monocytes: 7 %
NEUTROS ABS: 9.8 10*3/uL — AB (ref 1.4–7.0)
Neutrophils: 76 %
Platelets: 303 10*3/uL (ref 150–379)
RBC: 3.75 x10E6/uL — ABNORMAL LOW (ref 3.77–5.28)
RDW: 13.8 % (ref 12.3–15.4)
WBC: 12.8 10*3/uL — AB (ref 3.4–10.8)

## 2017-05-10 ENCOUNTER — Emergency Department (HOSPITAL_COMMUNITY)
Admission: EM | Admit: 2017-05-10 | Discharge: 2017-05-10 | Disposition: A | Discharge status: Home / Self Care | Payer: PRIVATE HEALTH INSURANCE | Attending: Emergency Medicine | Admitting: Emergency Medicine

## 2017-05-10 ENCOUNTER — Telehealth: Payer: Self-pay

## 2017-05-10 ENCOUNTER — Encounter (HOSPITAL_COMMUNITY): Payer: Self-pay

## 2017-05-10 ENCOUNTER — Emergency Department (HOSPITAL_COMMUNITY): Payer: PRIVATE HEALTH INSURANCE

## 2017-05-10 DIAGNOSIS — Z87891 Personal history of nicotine dependence: Secondary | ICD-10-CM | POA: Diagnosis not present

## 2017-05-10 DIAGNOSIS — Z79899 Other long term (current) drug therapy: Secondary | ICD-10-CM | POA: Diagnosis not present

## 2017-05-10 DIAGNOSIS — I10 Essential (primary) hypertension: Secondary | ICD-10-CM | POA: Insufficient documentation

## 2017-05-10 DIAGNOSIS — Z7982 Long term (current) use of aspirin: Secondary | ICD-10-CM | POA: Insufficient documentation

## 2017-05-10 DIAGNOSIS — Z951 Presence of aortocoronary bypass graft: Secondary | ICD-10-CM | POA: Diagnosis not present

## 2017-05-10 DIAGNOSIS — R079 Chest pain, unspecified: Secondary | ICD-10-CM | POA: Diagnosis not present

## 2017-05-10 DIAGNOSIS — I3139 Other pericardial effusion (noninflammatory): Secondary | ICD-10-CM

## 2017-05-10 DIAGNOSIS — J45909 Unspecified asthma, uncomplicated: Secondary | ICD-10-CM | POA: Insufficient documentation

## 2017-05-10 DIAGNOSIS — I313 Pericardial effusion (noninflammatory): Secondary | ICD-10-CM

## 2017-05-10 DIAGNOSIS — J449 Chronic obstructive pulmonary disease, unspecified: Secondary | ICD-10-CM | POA: Diagnosis not present

## 2017-05-10 LAB — BASIC METABOLIC PANEL
Anion gap: 6 (ref 5–15)
BUN: 8 mg/dL (ref 6–20)
CHLORIDE: 103 mmol/L (ref 101–111)
CO2: 27 mmol/L (ref 22–32)
Calcium: 8.5 mg/dL — ABNORMAL LOW (ref 8.9–10.3)
Creatinine, Ser: 0.94 mg/dL (ref 0.44–1.00)
Glucose, Bld: 157 mg/dL — ABNORMAL HIGH (ref 65–99)
POTASSIUM: 3.5 mmol/L (ref 3.5–5.1)
SODIUM: 136 mmol/L (ref 135–145)

## 2017-05-10 LAB — CBC
HEMATOCRIT: 35.6 % — AB (ref 36.0–46.0)
Hemoglobin: 11.5 g/dL — ABNORMAL LOW (ref 12.0–15.0)
MCH: 29.6 pg (ref 26.0–34.0)
MCHC: 32.3 g/dL (ref 30.0–36.0)
MCV: 91.8 fL (ref 78.0–100.0)
PLATELETS: 330 10*3/uL (ref 150–400)
RBC: 3.88 MIL/uL (ref 3.87–5.11)
RDW: 13.5 % (ref 11.5–15.5)
WBC: 11.7 10*3/uL — AB (ref 4.0–10.5)

## 2017-05-10 LAB — I-STAT TROPONIN, ED: Troponin i, poc: 0.05 ng/mL (ref 0.00–0.08)

## 2017-05-10 MED ORDER — HYDROCORTISONE NA SUCCINATE PF 250 MG IJ SOLR
200.0000 mg | Freq: Once | INTRAMUSCULAR | Status: AC
  Administered 2017-05-10: 200 mg via INTRAVENOUS
  Filled 2017-05-10: qty 200

## 2017-05-10 MED ORDER — DIPHENHYDRAMINE HCL 50 MG/ML IJ SOLN: 50.0000 mg | Freq: Once | INTRAMUSCULAR | Status: DC

## 2017-05-10 MED ORDER — MORPHINE SULFATE (PF) 4 MG/ML IV SOLN
4.0000 mg | Freq: Once | INTRAVENOUS | Status: AC
  Administered 2017-05-10: 4 mg via INTRAVENOUS
  Filled 2017-05-10: qty 1

## 2017-05-10 MED ORDER — IOPAMIDOL (ISOVUE-370) INJECTION 76%
INTRAVENOUS | Status: AC
  Administered 2017-05-10: 100 mL
  Filled 2017-05-10: qty 100

## 2017-05-10 MED ORDER — DIPHENHYDRAMINE HCL 50 MG/ML IJ SOLN
50.0000 mg | Freq: Once | INTRAMUSCULAR | Status: AC
Start: 2017-05-10 — End: 2017-05-10
  Administered 2017-05-10: 50 mg via INTRAVENOUS
  Filled 2017-05-10: qty 1

## 2017-05-10 MED ORDER — IBUPROFEN 600 MG PO TABS: 600.0000 mg | ORAL_TABLET | Freq: Four times a day (QID) | ORAL | 0 refills | Status: DC | PRN

## 2017-05-10 MED ORDER — HYDROCODONE-ACETAMINOPHEN 5-325 MG PO TABS: 1.0000 | ORAL_TABLET | Freq: Four times a day (QID) | ORAL | 0 refills | Status: DC | PRN

## 2017-05-10 NOTE — ED Notes (Signed)
Patient transported to CT 

## 2017-05-10 NOTE — ED Notes (Addendum)
Explained to pt that she will wait 4 hours for CT after Solu-cortef administration.

## 2017-05-10 NOTE — ED Provider Notes (Signed)
  Physical Exam  BP 129/76   Pulse 96   Temp 97.7 F (36.5 C)   Resp 18   Wt 99.8 kg (220 lb)   SpO2 96%   BMI 33.45 kg/m   Physical Exam  ED Course  Procedures  MDM Care assumed at 4pm. Patient on clindamycin for possible strep. Patient has been having more chest pain for several days. Trop neg x 1. Cardiology saw patient and doesn't think that she has MI. CT showed no PE, but there is small pericardial effusion. Patient states that she has some intermittent low grade temp for about a week. OP showed small vesicles roof of mouth but posterior pharynx clear. She appears well. I think she can certainly have pericarditis vs mono vs other viral etiology. I offered to get mono and ESR, CRP but she felt better and wants to go home. Recommend outpatient echo, PCP follow up. Will have her finish clinda as prescribed.        Drenda Freeze, MD 05/10/17 [phone]

## 2017-05-10 NOTE — Telephone Encounter (Signed)
Patient called office stating that she is having continued chest pain and pain in left arm. She has been seen here in office recently a couple times and treated with antibiotics. She continues to have an elevated white count and in general doesn't feel well. With patients history and current symptoms advised patient to go to ER for further evaluation. Patient verbalized understanding

## 2017-05-10 NOTE — ED Notes (Signed)
PT updated on change of plans.  D-dimer was cancelled and CT scan ordered.

## 2017-05-10 NOTE — ED Notes (Signed)
Pt requesting pain medication.  Dr. Darl Householder notified of same.

## 2017-05-10 NOTE — Consult Note (Signed)
Cardiology Consult    Patient ID: Brenda Cruz MRN: [id number], DOB/AGE: 1966/02/12   Admit date: 05/10/2017 Date of Consult: 05/10/2017  Primary Physician: [name], [name] Primary Cardiologist: Dr. Percival Spanish  Requesting Provider: Dr. 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   Brenda Cruz is a 51 y.o. female who is being seen today for the evaluation of chest pain at the request of Dr. Ellender Hose.   Past Medical History   Past Medical History:  Diagnosis Date  . Anxiety   . Asthma   . DDD (degenerative disc disease)   . GERD (gastroesophageal reflux disease)   . Hemorrhoids   . HIT (heparin-induced thrombocytopenia) ([place])   . Hyperlipemia   . Hypertension   . Iron deficiency anemia 11/23/2015  . Pulmonary emboli ([place]) 12/07/2015  . TMJ (temporomandibular joint disorder)     Past Surgical History:  Procedure Laterality Date  . CARDIAC CATHETERIZATION N/A 10/29/2015   Procedure: Left Heart Cath and Coronary Angiography;  Surgeon: [name], MD;  Location: [facility];  Service: Cardiovascular;  Laterality: N/A;  . CHOLECYSTECTOMY    . CORONARY ARTERY BYPASS GRAFT N/A 11/02/2015   Procedure: CORONARY ARTERY BYPASS GRAFTING (CABG)x 1 using left internal mammary artery.;  Surgeon: Melrose Nakayama, MD;  Location: [facility];  Service: Open Heart Surgery;  Laterality: N/A;  . LUMBAR FUSION    . TEE WITHOUT CARDIOVERSION N/A 11/02/2015   Procedure: TRANSESOPHAGEAL ECHOCARDIOGRAM (TEE);  Surgeon: Melrose Nakayama, MD;  Location: [facility];  Service: Open Heart Surgery;  Laterality: N/A;     Allergies  Allergies  Allergen Reactions  . Atorvastatin Other (See Comments)    myalgias  . Iohexol Itching, Rash and Other (See Comments)     Code: RASH, Desc: White blisters  in mouth during ivp in [facility] '93, ok w/ 13 hour prep today//a.calhoun, Onset Date: [id number]   . 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 Penicillins Other (See Comments)    Has patient had a PCN reaction causing immediate rash, facial/tongue/throat swelling, SOB or lightheadedness with hypotension: No Has patient had a PCN reaction causing severe rash involving mucus membranes or skin necrosis: No Has patient had a PCN reaction that required hospitalization No Has patient had a PCN reaction occurring within the last 10 years: No If all of the above answers are "NO", then may proceed with Cephalosporin use.  . Sulfa Antibiotics Itching and Rash  . Zithromax [Azithromycin] Itching and Rash    History of Present Illness    Brenda Cruz is known to our service. She underwent heart catheterization and subsequent CABG x 1 (Jan 2017) for left main disease. Her post-operative course was complicated by HIT and DVT/PE treated with xarelto.   She last saw Dr. Percival Spanish in clinic on 03/15/17. At that time she was in her usual state of health. She did complain of SOB  Initially underwent regular treadmill test   Not adequate  Continue dyspnea and chest pain   Given these results and her continued symptoms of atypical chest pain and dyspnea, cardiac catheterization vs dobutamine stress echo were discussed. Dobutamine echo performed on 04/04/17 was negative for ischemia with normal LV function.   On my interview, she  states she was treated for strep throat twice over the past month. During the first ABX treatment (04/22/17 tx with keflex) she started having left-sided chest pain that woke her from sleep, but only rated as a 3-4/10. The pain was intermittent with shortness of breath. She has complained of shortness of breath since May 2018 and often can't lay flat and breathe. Deep inspiration makes the chest pain worse, no relieving factors.  She continued to feel poorly and she was treated for  strep again (05/02/17, clindamycin). Over the last week, the pain now radiates up her left neck, down her left arm, and to he left scapula. The pain is worsening in intensity and continues to be intermittent. she states that the pain on palpation is from a lymph node.  Pain is continuous  At work it will last all day  Has put heating pads on it  TENS unit  Taking motrin, tylenol, tramadol  No help  Pleuritic in nature   She is very anxious about this chest pain because her troponin and EKG were both negative before her CABG. She expresses that she is not interested in repeat heart catheterization but is nervous to discharge home.   Inpatient Medications    . diphenhydrAMINE  50 mg Intravenous Once     Outpatient Medications    Prior to Admission medications   Medication Sig Start Date End Date Taking? Authorizing Provider  acetaminophen (TYLENOL) 500 MG tablet Take 1,000 mg by mouth every 6 (six) hours as needed for moderate pain.    [provider]  albuterol (PROVENTIL HFA;VENTOLIN HFA) 108 (90 Base) MCG/ACT inhaler Inhale 2 puffs into the lungs every 4 (four) hours as needed for wheezing or shortness of breath. 05/02/17   Evelina Dun A, [name]  amLODipine (NORVASC) 2.5 MG tablet Take 1 tablet (2.5 mg total) by mouth daily. 12/07/16   Terald Sleeper, PA-C  aspirin 81 MG tablet Take 1 tablet (81 mg total) by mouth daily. 02/01/17   Minus Breeding, MD  clindamycin (CLEOCIN) 300 MG capsule Take 1 capsule (300 mg total) by mouth 3 (three) times daily. 05/02/17   Sharion Balloon, [name]  esomeprazole (NEXIUM) 40 MG capsule TAKE ONE (1) CAPSULE EACH DAY 05/03/17   Terald Sleeper, PA-C  ferrous sulfate 325 (65 FE) MG tablet Take 325 mg by mouth daily as needed (only takes for menses).     [provider]  FLUoxetine (PROZAC) 40 MG capsule Take 1 capsule (40 mg total) by mouth daily. 12/07/16   Terald Sleeper, PA-C  lisinopril (PRINIVIL,ZESTRIL) 5 MG tablet TAKE ONE (1) TABLET EACH DAY  05/03/17   Terald Sleeper, PA-C  LORazepam (ATIVAN) 1 MG tablet Take 1 tablet (1 mg total) by mouth 2 (two) times daily as needed for anxiety. 02/24/17   [name], [name], [name]  metoprolol tartrate (LOPRESSOR) 25 MG tablet Take 12.5 mg by mouth 2 (two) times daily.    [provider]  metoprolol tartrate (LOPRESSOR) 50 MG tablet TAKE ONE TABLET BY MOUTH TWICE DAILY 05/02/17   [name], MD  Multiple Vitamin (MULTIVITAMIN WITH MINERALS) TABS tablet Take 1 tablet by mouth daily.    [provider]  predniSONE (STERAPRED UNI-PAK 21 TAB) 10 MG (21) TBPK tablet Use as directed 05/08/17   Evelina Dun A, [name]  Probiotic Product (RA PROBIOTIC GUMMIES) CHEW Chew 1 each by mouth daily.    [provider]  rosuvastatin (CRESTOR) 40 MG tablet Take 1 tablet (40  mg total) by mouth daily. 02/01/17   Minus Breeding, MD  traMADol (ULTRAM) 50 MG tablet Take 1 tablet (50 mg total) by mouth every 8 (eight) hours as needed for moderate pain. 01/27/17   Terald Sleeper, PA-C     Family History    Family History  Problem Relation Age of Onset  . Colon polyps Father   . Stomach cancer Paternal Grandmother   . Esophageal cancer Maternal Grandfather   . Breast cancer Mother   . Ovarian cancer Unknown        Maternal Side Great  Aunt  . Colon cancer Neg Hx     Social History    Social History   Social History  . Marital status: Married    Spouse name: N/A  . Number of children: 1  . Years of education: N/A   Occupational History  . Medical Billing    Social History Main Topics  . Smoking status: Former Research scientist (life sciences)  . Smokeless tobacco: Never Used     Comment: Counseling paper for smoking given to patient in exam room   . Alcohol use No  . Drug use: No  . Sexual activity: Not on file   Other Topics Concern  . Not on file   Social History Narrative   2 caffeine drinks daily      Review of Systems    General:  No chills, fever, night sweats or weight changes.    Cardiovascular:  + chest pain, + dyspnea on exertion, no edema, + orthopnea, palpitations, paroxysmal nocturnal dyspnea. Dermatological: No rash, lesions/masses Respiratory: No cough, dyspnea Urologic: No hematuria, dysuria Abdominal:   No nausea, vomiting, diarrhea, bright red blood per rectum, melena, or hematemesis Neurologic:  No visual changes, wkns, changes in mental status. All other systems reviewed and are otherwise negative except as noted above.  Physical Exam    Blood pressure 126/60, pulse 65, temperature 97.7 F (36.5 C), resp. rate 15, weight 220 lb (99.8 kg), SpO2 100 %.  General: Pleasant, NAD  COmplains of pain L chest with deep breath  Psych: Normal affect. Neuro: Alert and oriented X 3. Moves all extremities spontaneously. HEENT: Normal  Neck: Supple without bruits or JVD. Lungs:  Resp regular and unlabored, CTA.  No wheezes or rales   Chest  Tender to palpation L upper chest (mimics some of pain) Heart: RRR no s3, s4, or murmurs.  No rubs   Abdomen: Soft, non-tender, non-distended, BS + x 4.  Extremities: No clubbing, cyanosis or edema. DP/PT/Radials 2+ and equal bilaterally.  Labs    Troponin (Point of Care Test)  Recent Labs  05/10/17 1320  TROPIPOC 0.05   No results for input(s): CKTOTAL, CKMB, TROPONINI in the last 72 hours. Lab Results  Component Value Date   WBC 11.7 (H) 05/10/2017   HGB 11.5 (L) 05/10/2017   HCT 35.6 (L) 05/10/2017   MCV 91.8 05/10/2017   PLT 330 05/10/2017    Recent Labs Lab 05/10/17 1300  NA 136  K 3.5  CL 103  CO2 27  BUN 8  CREATININE 0.94  CALCIUM 8.5*  GLUCOSE 157*   Lab Results  Component Value Date   CHOL 168 01/27/2017   HDL 32 (L) 01/27/2017   LDLCALC 87 01/27/2017   TRIG 246 (H) 01/27/2017   Lab Results  Component Value Date   DDIMER 1.60 (H) 04/18/2016     Radiology Studies    Dg Chest 2 View  Result Date: 05/10/2017 CLINICAL DATA:  Left-sided chest pain, cough, fever, and nausea for 2  weeks. Coronary artery disease. EXAM: CHEST  2 VIEW COMPARISON:  05/08/2017 FINDINGS: The heart size and mediastinal contours are within normal limits. Prior CABG noted. Mild scarring in the lingula remains stable. No evidence of pulmonary consolidation or edema. No evidence of pleural effusion The visualized skeletal structures are unremarkable. IMPRESSION: Stable exam.  No active cardiopulmonary disease. Electronically Signed   By: Earle Gell M.D.   On: 05/10/2017 13:30    ECG & Cardiac Imaging    EKG 05/10/17: NSR, low voltage (1/2 standard)    Dobutamine Echocardiogram 04/04/17: - Negative [place] echo.   No evidence of ischemia .   Normal LV function  POET 02/10/17:  The patient walked for 5:57 of a Bruce protocol GXt. Peak HR of 130 which is 75% predicted maximal HR  There were no ST or T wave changes to suggest ischemia .  No QRS widening at peak exercise  Nondiagnostic GXT due to inability to achieve target HR . There was no evidence of ischemia at peak exercise  Heart catheterization 10/29/15:  Ostial  LM lesion, 80% stenosed.  The left ventricular systolic function is normal.   Assessment & Plan    1. Chest pain   Patient with hx of LM stenosis   - she is s/p LIMA to LAD (off-pump) 11/02/15  - return of dyspnea and possible orthopnea in 02/2017 with negative dobutamine echo Pt presents with CP that is been ongoing for past 2 wks  Pleuritic   - This chest pain sounds atypical in nature.  It does not appear to be cardiac in nature  Trop neg  I agree with CT to eval L lung   2 CAD  As above  Current chest pain does not appear to be cardiac  3  SOB  Chronic  Neg dobutamine echo  Follow for now  4  ID Patient being treated for strep  Throat clear   Not sure if the Abx are contrib to symtpoms  Denies diarrhea     5. HTN - current regimen of norvasc, lisinopril, and lopressor   6 HLD - continue lipitor   7. Leukocytosis - WBC 11.7 (12.8) - she is afebrile  with recent treatment for strep throat x 2 (04/22/17, 05/02/17); pt continued to have fever and chills while on clindamycin    Signed, Danijah Noh, PA-C 05/10/2017, 3:40 PM [phone]

## 2017-05-10 NOTE — Discharge Instructions (Signed)
You have a small pericardial effusion. See your heart doctor to get echo outpatient.   Take round the clock motrin. Take vicodin for severe pain.   Finish clindamycin as prescribed.   See your primary doctor as well   Return to ER if you have worse chest pain, trouble breathing, fever, vomiting.

## 2017-05-10 NOTE — ED Provider Notes (Addendum)
[facility] DEPT Provider Note   CSN: [id number] Arrival date & time: 05/10/17  1247     History   Chief Complaint Chief Complaint  Patient presents with  . Chest Pain    HPI Brenda Cruz is a 51 y.o. female with a past medical history significant for HTN, HLD, CAD w/CABG (Jan 2017), anxiety, HIT with previous PE ( previously on Xarealto) who presents with chest pain radiating to left arm. Patient reports that pain started about two weeks ago, after she started treatment for Strep throat. Patient was initially started on Keflex but continue to spike fever and was switched to clindamycin. During treatment patient start to develop severe pain that woke her from her sleep. Patient endorses shortness of breath associated with chest pain. Patient reports that deep breath makes the pain worse with radiation to left shoulder and arm. She denies nausea vomiting, abdominal pain, dizziness, headaches.   HPI  Past Medical History:  Diagnosis Date  . Anxiety   . Asthma   . DDD (degenerative disc disease)   . GERD (gastroesophageal reflux disease)   . Hemorrhoids   . HIT (heparin-induced thrombocytopenia) ([place])   . Hyperlipemia   . Hypertension   . Iron deficiency anemia 11/23/2015  . Pulmonary emboli ([place]) 12/07/2015  . TMJ (temporomandibular joint disorder)     Patient Active Problem List   Diagnosis Date Noted  . Hyperlipidemia 03/15/2017  . Fatigue 01/27/2017  . Snoring 01/27/2017  . Apnea 01/27/2017  . Stress incontinence of urine 12/07/2016  . COPD exacerbation ([place]) 12/07/2016  . ACE-inhibitor cough 12/07/2016  . Breast screening 05/31/2016  . Dyslipidemia 12/11/2015  . HIT (heparin-induced thrombocytopenia) ([place]) 12/08/2015  . Hx of pulmonary embolus 12/07/2015  . Iron deficiency anemia 11/23/2015  . HCAP (healthcare-associated pneumonia) 11/15/2015  . AKI (acute kidney injury) ([place]) 11/15/2015  . S/P CABG x 1 11/02/2015  . Coronary artery disease involving native  coronary artery of native heart without angina pectoris   . Chest pain 10/29/2015  . Coronary artery disease due to lipid rich plaque 10/29/2015  . Chest pain, exertional   . Essential hypertension 10/16/2015  . Morbid obesity ([place]) 10/16/2015  . GERD (gastroesophageal reflux disease) 05/16/2013  . Depression 05/16/2013  . GAD (generalized anxiety disorder) 05/16/2013    Past Surgical History:  Procedure Laterality Date  . CARDIAC CATHETERIZATION N/A 10/29/2015   Procedure: Left Heart Cath and Coronary Angiography;  Surgeon: [name], MD;  Location: [facility];  Service: Cardiovascular;  Laterality: N/A;  . CHOLECYSTECTOMY    . CORONARY ARTERY BYPASS GRAFT N/A 11/02/2015   Procedure: CORONARY ARTERY BYPASS GRAFTING (CABG)x 1 using left internal mammary artery.;  Surgeon: Melrose Nakayama, MD;  Location: [facility];  Service: Open Heart Surgery;  Laterality: N/A;  . LUMBAR FUSION    . TEE WITHOUT CARDIOVERSION N/A 11/02/2015   Procedure: TRANSESOPHAGEAL ECHOCARDIOGRAM (TEE);  Surgeon: Melrose Nakayama, MD;  Location: [facility];  Service: Open Heart Surgery;  Laterality: N/A;    OB History    No data available       Home Medications    Prior to Admission medications   Medication Sig Start Date End Date Taking? Authorizing Provider  acetaminophen (TYLENOL) 500 MG tablet Take 1,000 mg by mouth every 6 (six) hours as needed for moderate pain.    [provider]  albuterol (PROVENTIL HFA;VENTOLIN HFA) 108 (90 Base) MCG/ACT inhaler Inhale 2 puffs into the lungs every 4 (four) hours as needed  for wheezing or shortness of breath. 05/02/17   Evelina Dun A, [name]  amLODipine (NORVASC) 2.5 MG tablet Take 1 tablet (2.5 mg total) by mouth daily. 12/07/16   Terald Sleeper, PA-C  aspirin 81 MG tablet Take 1 tablet (81 mg total) by mouth daily. 02/01/17   Minus Breeding, MD  clindamycin (CLEOCIN) 300 MG capsule Take 1 capsule (300 mg total) by mouth 3 (three) times daily.  05/02/17   Sharion Balloon, [name]  esomeprazole (NEXIUM) 40 MG capsule TAKE ONE (1) CAPSULE EACH DAY 05/03/17   Terald Sleeper, PA-C  ferrous sulfate 325 (65 FE) MG tablet Take 325 mg by mouth daily as needed (only takes for menses).     [provider]  FLUoxetine (PROZAC) 40 MG capsule Take 1 capsule (40 mg total) by mouth daily. 12/07/16   Terald Sleeper, PA-C  lisinopril (PRINIVIL,ZESTRIL) 5 MG tablet TAKE ONE (1) TABLET EACH DAY 05/03/17   Terald Sleeper, PA-C  LORazepam (ATIVAN) 1 MG tablet Take 1 tablet (1 mg total) by mouth 2 (two) times daily as needed for anxiety. 02/24/17   [name], [name], [name]  metoprolol tartrate (LOPRESSOR) 25 MG tablet Take 12.5 mg by mouth 2 (two) times daily.    [provider]  metoprolol tartrate (LOPRESSOR) 50 MG tablet TAKE ONE TABLET BY MOUTH TWICE DAILY 05/02/17   [name], MD  Multiple Vitamin (MULTIVITAMIN WITH MINERALS) TABS tablet Take 1 tablet by mouth daily.    [provider]  predniSONE (STERAPRED UNI-PAK 21 TAB) 10 MG (21) TBPK tablet Use as directed 05/08/17   Evelina Dun A, [name]  Probiotic Product (RA PROBIOTIC GUMMIES) CHEW Chew 1 each by mouth daily.    [provider]  rosuvastatin (CRESTOR) 40 MG tablet Take 1 tablet (40 mg total) by mouth daily. 02/01/17   Minus Breeding, MD  traMADol (ULTRAM) 50 MG tablet Take 1 tablet (50 mg total) by mouth every 8 (eight) hours as needed for moderate pain. 01/27/17   Terald Sleeper, PA-C    Family History Family History  Problem Relation Age of Onset  . Colon polyps Father   . Stomach cancer Paternal Grandmother   . Esophageal cancer Maternal Grandfather   . Breast cancer Mother   . Ovarian cancer Unknown        Maternal Side Great  Aunt  . Colon cancer Neg Hx     Social History Social History  Substance Use Topics  . Smoking status: Former Research scientist (life sciences)  . Smokeless tobacco: Never Used     Comment: Counseling paper for smoking given to patient in exam room    . Alcohol use No     Allergies   Atorvastatin; Iohexol; Heparin; Penicillins; Sulfa antibiotics; and Zithromax [azithromycin]   Review of Systems Review of Systems  Constitutional: Negative.   HENT: Negative.   Eyes: Negative.   Respiratory: Positive for shortness of breath.   Cardiovascular: Positive for chest pain.  Gastrointestinal: Negative.   Endocrine: Negative.   Genitourinary: Negative.   Musculoskeletal: Negative.   Allergic/Immunologic: Negative.   Neurological: Negative.   Hematological: Negative.   Psychiatric/Behavioral: Negative.      Physical Exam Updated Vital Signs BP 126/60   Pulse 65   Temp 97.7 F (36.5 C)   Resp 15   Wt 99.8 kg (220 lb)   SpO2 100%   BMI 33.45 kg/m   Physical Exam  Constitutional: She is oriented to person, place, and time. She appears well-developed.  HENT:  Head: Normocephalic and atraumatic.  Eyes: Pupils are equal, round, and reactive to light. Conjunctivae and EOM are normal.  Neck: Normal range of motion. Neck supple.  Cardiovascular: Normal rate, regular rhythm and normal heart sounds.   Pulmonary/Chest: Effort normal and breath sounds normal.  Abdominal: Soft. Bowel sounds are normal.  Musculoskeletal: Normal range of motion.  Neurological: She is alert and oriented to person, place, and time.     ED Treatments / Results  Labs (all labs ordered are listed, but only abnormal results are displayed) Labs Reviewed  BASIC METABOLIC PANEL - Abnormal; Notable for the following:       Result Value   Glucose, Bld 157 (*)    Calcium 8.5 (*)    All other components within normal limits  CBC - Abnormal; Notable for the following:    WBC 11.7 (*)    Hemoglobin 11.5 (*)    HCT 35.6 (*)    All other components within normal limits  I-STAT TROPONIN, ED    EKG  EKG Interpretation  Date/Time:  Wednesday May 10 2017 12:56:34 EDT Ventricular Rate:  67 PR Interval:  142 QRS Duration: 80 QT Interval:  426 QTC  Calculation: 450 R Axis:   27 Text Interpretation:  Normal sinus rhythm Normal ECG No significant change since last tracing Confirmed by Duffy Bruce [phone]) on 05/10/2017 2:50:36 PM       Radiology Dg Chest 2 View  Result Date: 05/10/2017 CLINICAL DATA:  Left-sided chest pain, cough, fever, and nausea for 2 weeks. Coronary artery disease. EXAM: CHEST  2 VIEW COMPARISON:  05/08/2017 FINDINGS: The heart size and mediastinal contours are within normal limits. Prior CABG noted. Mild scarring in the lingula remains stable. No evidence of pulmonary consolidation or edema. No evidence of pleural effusion The visualized skeletal structures are unremarkable. IMPRESSION: Stable exam.  No active cardiopulmonary disease. Electronically Signed   By: Earle Gell M.D.   On: 05/10/2017 13:30    Procedures Procedures (including critical care time)  Medications Ordered in ED Medications  hydrocortisone sodium succinate (SOLU-CORTEF) injection 200 mg (not administered)  diphenhydrAMINE (BENADRYL) injection 50 mg (not administered)     Initial Impression / Assessment and Plan / ED Course  I have reviewed the triage vital signs and the nursing notes.  Pertinent labs & imaging results that were available during my care of the patient were reviewed by me and considered in my medical decision making (see chart for details).   Patient is 51 yo female who presented wit h chest pain radiating to her left arm. Heart score of 2. On exam, patient has pain on palpation but describe the pain as different from what she has been experiencing. Trop is negative and EKG is unchanged. Patient had a recent stress date on 6/19 ordered by Dr. Percival Spanish her cardiologist  that was within normal limits. With patient history for DVT, patient will have a CTA to rule out PE. Given, recent discussion of cardiac catheterization and cardiac history, will discuss case with cardiology. Cardiology will see patient and give input on  management.   Final Clinical Impressions(s) / ED Diagnoses   Final diagnoses:  Chest pain    New Prescriptions New Prescriptions   No medications on file     Marjie Skiff, MD 05/10/17 1652    Marjie Skiff, MD 05/10/17 1743    Duffy Bruce, MD 05/12/17 [place], [place], MD 05/24/17 1411

## 2017-05-10 NOTE — ED Triage Notes (Signed)
Pt reports to the ed with complaints of left sided chest pain that is sharp in nature and radiates to her left arm neck and jaw. She has an extensive cardiac history with history of cabg and pe reports this feels like a PE. No apparent distress in triage.

## 2017-05-10 NOTE — ED Notes (Signed)
Per dr Ellender Hose pt does not need cta in triage.

## 2017-05-10 NOTE — ED Notes (Signed)
Pt ambulatory to restroom. Denies dizziness or lightheadedness.

## 2017-05-11 ENCOUNTER — Telehealth: Payer: Self-pay | Admitting: Cardiology

## 2017-05-11 DIAGNOSIS — I313 Pericardial effusion (noninflammatory): Secondary | ICD-10-CM

## 2017-05-11 DIAGNOSIS — I3139 Other pericardial effusion (noninflammatory): Secondary | ICD-10-CM

## 2017-05-11 NOTE — Telephone Encounter (Signed)
Returned call to patient. She was evaluated in ED yesterday for CP and was found to have pericarditis, pericardial effusion per her report. She states she was recommended to have an outpatient echo (per EDP note). She states cardiologist, Dr. Harrington Challenger, did not recommend any cardiac testing and did not think her symptoms were cardiac related. Patient calling requesting this test be ordered - and also to see if her appt can be moved sooner.   Will route to DOD in Dr. Rosezella [place] absence to review and advise if test is appropriate

## 2017-05-11 NOTE — Telephone Encounter (Signed)
She did have a small pericardial effusion on CT that was somewhat increased from one year ago. I think it is reasonable to order an Echo prior to her follow up visit.  Cypress Fanfan Martin[place] MD, [facility]

## 2017-05-11 NOTE — Telephone Encounter (Signed)
New message  Pt call requesting to speak with RN. Pt would like to get a Echo complete. Please call back to discuss

## 2017-05-11 NOTE — Telephone Encounter (Signed)
Returned call to patient. Provided MD recommendations. She said she can also come to [facility] for an MD appt if needed. Will message scheduler regarding echo & appt.

## 2017-05-12 ENCOUNTER — Telehealth: Payer: Self-pay | Admitting: *Deleted

## 2017-05-12 NOTE — Telephone Encounter (Signed)
Me      05/12/17 11:46 AM  Note    Spoke with pt advised her to get Echo done and Dr Lolly Mustache will reviewed it and if he see's any abnormality in which he will need to see her sooner we will call and double book her sooner.

## 2017-05-12 NOTE — Telephone Encounter (Signed)
Spoke with pt advised her to get Echo done and Dr Lolly Mustache will reviewed it and if he see's any abnormality in which he will need to see her sooner we will call and double book her sooner.

## 2017-05-12 NOTE — Telephone Encounter (Signed)
-----   Message from Corinna Lines sent at 05/12/2017 11:25 AM EDT ----- Regarding: echo     Her echo is scheduled for 05-16-17 at 10:30/  I looked for an earlier appointment with Dr. Percival Spanish, but, there are none available before the 29th as I think he is on vacation and the PAs in his care team are also booked up.  [facility]

## 2017-05-13 ENCOUNTER — Other Ambulatory Visit: Payer: Self-pay | Admitting: Family Medicine

## 2017-05-13 ENCOUNTER — Telehealth: Payer: Self-pay | Admitting: Family Medicine

## 2017-05-13 MED ORDER — CEFDINIR 300 MG PO CAPS: 300.0000 mg | ORAL_CAPSULE | Freq: Two times a day (BID) | ORAL | 0 refills | Status: DC

## 2017-05-13 NOTE — Telephone Encounter (Signed)
Pt aware.

## 2017-05-13 NOTE — Telephone Encounter (Signed)
Please contact the patient that her know that I sent in the [facility] prescription as requested

## 2017-05-13 NOTE — Telephone Encounter (Signed)
Pt is concerned and believes she still has strep throat and all of her family has it. She has finished the clindamycin but she says the only thing that has helped in the past was [facility] and would like it sent into the pharmacy if possible. She does have a hospital f/u appt scheduled with MMM on Tuesday.

## 2017-05-16 ENCOUNTER — Ambulatory Visit: Payer: PRIVATE HEALTH INSURANCE | Admitting: Nurse Practitioner

## 2017-05-16 ENCOUNTER — Other Ambulatory Visit: Payer: Self-pay | Admitting: Family

## 2017-05-16 ENCOUNTER — Ambulatory Visit (HOSPITAL_COMMUNITY): Payer: No Typology Code available for payment source | Attending: Cardiology

## 2017-05-16 ENCOUNTER — Other Ambulatory Visit: Payer: Self-pay

## 2017-05-16 DIAGNOSIS — Z951 Presence of aortocoronary bypass graft: Secondary | ICD-10-CM | POA: Diagnosis not present

## 2017-05-16 DIAGNOSIS — R079 Chest pain, unspecified: Secondary | ICD-10-CM | POA: Insufficient documentation

## 2017-05-16 DIAGNOSIS — I313 Pericardial effusion (noninflammatory): Secondary | ICD-10-CM | POA: Diagnosis not present

## 2017-05-16 DIAGNOSIS — R0600 Dyspnea, unspecified: Secondary | ICD-10-CM | POA: Insufficient documentation

## 2017-05-16 DIAGNOSIS — I3139 Other pericardial effusion (noninflammatory): Secondary | ICD-10-CM

## 2017-05-18 ENCOUNTER — Ambulatory Visit: Payer: PRIVATE HEALTH INSURANCE | Admitting: Family

## 2017-05-29 ENCOUNTER — Encounter: Payer: Self-pay | Admitting: Cardiology

## 2017-06-01 ENCOUNTER — Ambulatory Visit: Payer: PRIVATE HEALTH INSURANCE | Admitting: Physician Assistant

## 2017-06-13 ENCOUNTER — Ambulatory Visit (INDEPENDENT_AMBULATORY_CARE_PROVIDER_SITE_OTHER): Payer: PRIVATE HEALTH INSURANCE | Admitting: Physician Assistant

## 2017-06-13 ENCOUNTER — Encounter: Payer: Self-pay | Admitting: Physician Assistant

## 2017-06-13 VITALS — BP 124/87 | HR 80 | Temp 98.5°F | Ht 68.0 in | Wt 227.0 lb

## 2017-06-13 DIAGNOSIS — A491 Streptococcal infection, unspecified site: Secondary | ICD-10-CM | POA: Diagnosis not present

## 2017-06-13 DIAGNOSIS — R071 Chest pain on breathing: Secondary | ICD-10-CM | POA: Diagnosis not present

## 2017-06-13 MED ORDER — IBUPROFEN 800 MG PO TABS: 800.0000 mg | ORAL_TABLET | Freq: Three times a day (TID) | ORAL | 0 refills | Status: DC | PRN

## 2017-06-13 MED ORDER — CEFDINIR 300 MG PO CAPS: 300.0000 mg | ORAL_CAPSULE | Freq: Two times a day (BID) | ORAL | 0 refills | Status: DC

## 2017-06-13 NOTE — Progress Notes (Signed)
BP 124/87   Pulse 80   Temp 98.5 F (36.9 C) (Oral)   Ht 5\' 8"  (1.727 m)   Wt 227 lb (103 kg)   SpO2 99%   BMI 34.52 kg/m    Subjective:    Patient ID: Brenda Cruz, female    DOB: 1966-06-14, 51 y.o.   MRN: [id number]  HPI: Brenda Cruz is a 51 y.o. female presenting on 06/13/2017 for ER follow up (Patient states that she went to ER with pericarditis and pleural effusion ) and Shortness of Breath  This patient comes in for periodic recheck on medications and conditions including ED visit where pericardial effusion was seen on CT. Had echo the next week but was normal. She finished the clindamycin that she was on  For a strep infection before the pain started, plus ibuprofen.  In the past couple weeks there has been a restarting of These symptoms. He will hurt behind her sternum. She has increased pain when she reclines. She is unable to sleep very well. She has had some fevers off and on over the past week. She does have cardiology appointment tomorrow with Dr. Percival Spanish. There is concern that another pericardial or endocardial cause is going on..   All medications are reviewed today. There are no reports of any problems with the medications. All of the medical conditions are reviewed and updated.  Lab work is reviewed and will be ordered as medically necessary. There are no new problems reported with today's visit.   Relevant past medical, surgical, family and social history reviewed and updated as indicated. Allergies and medications reviewed and updated.  Past Medical History:  Diagnosis Date  . Anxiety   . Asthma   . DDD (degenerative disc disease)   . GERD (gastroesophageal reflux disease)   . Hemorrhoids   . HIT (heparin-induced thrombocytopenia) ([place])   . Hyperlipemia   . Hypertension   . Iron deficiency anemia 11/23/2015  . Pulmonary emboli ([place]) 12/07/2015  . TMJ (temporomandibular joint disorder)     Past Surgical History:  Procedure Laterality Date  . CARDIAC  CATHETERIZATION N/A 10/29/2015   Procedure: Left Heart Cath and Coronary Angiography;  Surgeon: [name], MD;  Location: [place] [facility];  Service: Cardiovascular;  Laterality: N/A;  . CHOLECYSTECTOMY    . CORONARY ARTERY BYPASS GRAFT N/A 11/02/2015   Procedure: CORONARY ARTERY BYPASS GRAFTING (CABG)x 1 using left internal mammary artery.;  Surgeon: Melrose Nakayama, MD;  Location: [place];  Service: Open Heart Surgery;  Laterality: N/A;  . LUMBAR FUSION    . TEE WITHOUT CARDIOVERSION N/A 11/02/2015   Procedure: TRANSESOPHAGEAL ECHOCARDIOGRAM (TEE);  Surgeon: Melrose Nakayama, MD;  Location: [place];  Service: Open Heart Surgery;  Laterality: N/A;    Review of Systems  Constitutional: Positive for fatigue and fever. Negative for activity change and diaphoresis.  HENT: Negative.   Eyes: Negative.   Respiratory: Positive for chest tightness and shortness of breath. Negative for apnea, cough and wheezing.   Cardiovascular: Positive for chest pain. Negative for palpitations and leg swelling.  Gastrointestinal: Negative.  Negative for abdominal pain.  Endocrine: Negative.   Genitourinary: Negative.  Negative for dysuria.  Musculoskeletal: Negative.   Skin: Negative.   Neurological: Negative.     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   Sulfa Antibiotics Itching, Rash   Zithromax [azithromycin] Itching, Rash      Medication List       Accurate as of 06/13/17  3:27 PM. Always use your most recent med list.          acetaminophen 500 MG tablet Commonly known as:  TYLENOL Take 1,000 mg by mouth every 6 (six) hours as needed for moderate pain.   albuterol 108 (90 Base) MCG/ACT inhaler Commonly known as:  PROVENTIL HFA;VENTOLIN HFA Inhale 2 puffs into the lungs every 4 (four) hours as needed for wheezing or shortness of breath.   amLODipine 2.5 MG tablet Commonly known as:  NORVASC Take 1 tablet (2.5 mg total) by mouth daily.   aspirin EC 81 MG tablet Take 81 mg by mouth daily.   cefdinir 300 MG capsule Commonly known as:  OMNICEF Take 1 capsule (300 mg total) by mouth 2 (two) times daily. 1 po BID   diphenhydrAMINE 25 MG tablet Commonly known as:  BENADRYL Take 25 mg by mouth every 6 (six) hours as needed for itching or allergies.   esomeprazole 40 MG capsule Commonly known as:  NEXIUM TAKE ONE (1) CAPSULE EACH DAY   ferrous sulfate 325 (65 FE) MG tablet Take 325 mg by mouth daily as needed (only takes for menses).   FLUoxetine 40 MG capsule Commonly known as:  PROZAC Take 1 capsule (40 mg total) by mouth daily.   ibuprofen 600 MG tablet Commonly known as:  ADVIL,MOTRIN Take 1 tablet (600 mg total) by mouth every 6 (six) hours as needed.   ibuprofen 800 MG tablet Commonly known as:  ADVIL,MOTRIN Take 1 tablet (800 mg total) by mouth every 8 (eight) hours as needed.   LORazepam 1 MG tablet Commonly known as:  ATIVAN Take 1 tablet (1 mg total) by mouth 2 (two) times daily as needed for anxiety.   metoprolol tartrate 50 MG tablet Commonly known as:  LOPRESSOR TAKE ONE TABLET BY MOUTH TWICE DAILY   multivitamin with minerals Tabs tablet Take 1 tablet by mouth daily.   RA PROBIOTIC  GUMMIES Chew Chew 1 each by mouth daily.   rosuvastatin 40 MG tablet Commonly known as:  CRESTOR Take 1 tablet (40 mg total) by mouth daily.   traMADol 50 MG tablet Commonly known as:  ULTRAM Take 1 tablet (50 mg total) by mouth every 8 (eight) hours as needed for moderate pain.            Discharge Care Instructions        Start     Ordered   06/13/17 0000  cefdinir (OMNICEF) 300 MG capsule  2 times daily    Question:  Supervising Provider  Answer:  Timmothy Euler   06/13/17 1521   06/13/17 0000  ibuprofen (ADVIL,MOTRIN) 800 MG tablet  Every 8 hours PRN    Question:  Supervising Provider  Answer:  Timmothy Euler   06/13/17 1521         Objective:    BP 124/87   Pulse 80   Temp 98.5 F (36.9 C) (Oral)   Ht 5\' 8"  (1.727 m)   Wt 227 lb (103 kg)   SpO2 99%   BMI 34.52 kg/m   Allergies  Allergen Reactions  . Atorvastatin Other (See Comments)    myalgias  . Iohexol Itching, Rash and Other (See Comments)     Code: RASH, Desc: White blisters in mouth during ivp in [facility] '93, ok w/ 13 hour prep today//a.calhoun, Onset Date: [id number]   . 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 Penicillins Other (See Comments)    Has patient had a PCN reaction causing immediate rash, facial/tongue/throat swelling, SOB or lightheadedness with hypotension: No Has patient had a PCN reaction causing severe rash involving mucus membranes or skin necrosis: No Has patient had a PCN reaction that required hospitalization No Has patient had a PCN reaction occurring within the last 10 years: No If all of the above answers are "NO", then may proceed with Cephalosporin use.  . Sulfa Antibiotics Itching and Rash  . Zithromax [Azithromycin] Itching and Rash    Physical Exam  Constitutional: She is oriented to person, place, and time. She appears well-developed and well-nourished.  HENT:  Head: Normocephalic and atraumatic.  Right Ear: Tympanic membrane, external  ear and ear canal normal.  Left Ear: Tympanic membrane, external ear and ear canal normal.  Nose: Nose normal. No rhinorrhea.  Mouth/Throat: Oropharynx is clear and moist and mucous membranes are normal. No oropharyngeal exudate or posterior oropharyngeal erythema.  Eyes: Pupils are equal, round, and reactive to light. Conjunctivae and EOM are normal.  Neck: Normal range of motion. Neck supple.  Cardiovascular: Normal rate, regular rhythm, normal heart sounds and intact distal pulses.   Pulmonary/Chest: Effort normal and breath sounds normal.  Abdominal: Soft. Bowel sounds are normal.  Neurological: She is alert and oriented to person, place, and time. She has normal reflexes.  Skin: Skin is warm and dry. No rash noted.  Psychiatric: Her behavior is normal. Judgment and thought content normal. Her mood appears anxious.  Nursing note and vitals reviewed.   Results for orders placed or performed during the hospital encounter of 81/01/75  Basic metabolic panel  Result Value Ref Range   Sodium 136 135 - 145 mmol/L   Potassium 3.5 3.5 - 5.1 mmol/L   Chloride 103 101 - 111 mmol/L   CO2 27 22 - 32 mmol/L   Glucose, Bld 157 (H) 65 - 99 mg/dL   BUN 8 6 - 20 mg/dL   Creatinine, Ser 0.94 0.44 - 1.00 mg/dL   Calcium 8.5 (L) 8.9 - 10.3 mg/dL   GFR calc non Af Amer >60 >60 mL/min   GFR calc Af Amer >60 >60 mL/min   Anion gap 6 5 - 15  CBC  Result Value Ref Range   WBC 11.7 (H) 4.0 - 10.5 K/uL   RBC 3.88 3.87 - 5.11 MIL/uL   Hemoglobin 11.5 (L) 12.0 - 15.0 g/dL   HCT 35.6 (L) 36.0 - 46.0 %   MCV 91.8 78.0 - 100.0 fL   MCH 29.6 26.0 - 34.0 pg   MCHC 32.3 30.0 - 36.0 g/dL   RDW 13.5 11.5 - 15.5 %   Platelets 330 150 - 400 K/uL  I-stat troponin, ED  Result Value  Ref Range   Troponin i, poc 0.05 0.00 - 0.08 ng/mL   Comment 3              Assessment & Plan:   1. Streptococcal infection - cefdinir (OMNICEF) 300 MG capsule; Take 1 capsule (300 mg total) by mouth 2 (two) times daily. 1 po  BID  Dispense: 20 capsule; Refill: 0 - ibuprofen (ADVIL,MOTRIN) 800 MG tablet; Take 1 tablet (800 mg total) by mouth every 8 (eight) hours as needed.  Dispense: 30 tablet; Refill: 0  2. Chest pain on breathing - cefdinir (OMNICEF) 300 MG capsule; Take 1 capsule (300 mg total) by mouth 2 (two) times daily. 1 po BID  Dispense: 20 capsule; Refill: 0 - ibuprofen (ADVIL,MOTRIN) 800 MG tablet; Take 1 tablet (800 mg total) by mouth every 8 (eight) hours as needed.  Dispense: 30 tablet; Refill: 0    Current Outpatient Prescriptions:  .  acetaminophen (TYLENOL) 500 MG tablet, Take 1,000 mg by mouth every 6 (six) hours as needed for moderate pain., Disp: , Rfl:  .  albuterol (PROVENTIL HFA;VENTOLIN HFA) 108 (90 Base) MCG/ACT inhaler, Inhale 2 puffs into the lungs every 4 (four) hours as needed for wheezing or shortness of breath., Disp: 1 Inhaler, Rfl: 0 .  amLODipine (NORVASC) 2.5 MG tablet, Take 1 tablet (2.5 mg total) by mouth daily., Disp: 90 tablet, Rfl: 3 .  aspirin EC 81 MG tablet, Take 81 mg by mouth daily., Disp: , Rfl:  .  diphenhydrAMINE (BENADRYL) 25 MG tablet, Take 25 mg by mouth every 6 (six) hours as needed for itching or allergies., Disp: , Rfl:  .  esomeprazole (NEXIUM) 40 MG capsule, TAKE ONE (1) CAPSULE EACH DAY (Patient taking differently: TAKE 40MG  BY MOUTH DAILY), Disp: 90 capsule, Rfl: 0 .  ferrous sulfate 325 (65 FE) MG tablet, Take 325 mg by mouth daily as needed (only takes for menses). , Disp: , Rfl:  .  FLUoxetine (PROZAC) 40 MG capsule, Take 1 capsule (40 mg total) by mouth daily., Disp: 30 capsule, Rfl: 11 .  ibuprofen (ADVIL,MOTRIN) 600 MG tablet, Take 1 tablet (600 mg total) by mouth every 6 (six) hours as needed., Disp: 30 tablet, Rfl: 0 .  LORazepam (ATIVAN) 1 MG tablet, Take 1 tablet (1 mg total) by mouth 2 (two) times daily as needed for anxiety., Disp: 60 tablet, Rfl: 1 .  metoprolol tartrate (LOPRESSOR) 50 MG tablet, TAKE ONE TABLET BY MOUTH TWICE DAILY (Patient taking  differently: TAKE 25MG  BY MOUTH TWICE DAILY), Disp: 180 tablet, Rfl: 0 .  Multiple Vitamin (MULTIVITAMIN WITH MINERALS) TABS tablet, Take 1 tablet by mouth daily., Disp: , Rfl:  .  Probiotic Product (RA PROBIOTIC GUMMIES) CHEW, Chew 1 each by mouth daily., Disp: , Rfl:  .  rosuvastatin (CRESTOR) 40 MG tablet, Take 1 tablet (40 mg total) by mouth daily., Disp: 30 tablet, Rfl: 11 .  traMADol (ULTRAM) 50 MG tablet, Take 1 tablet (50 mg total) by mouth every 8 (eight) hours as needed for moderate pain., Disp: 60 tablet, Rfl: 5 .  cefdinir (OMNICEF) 300 MG capsule, Take 1 capsule (300 mg total) by mouth 2 (two) times daily. 1 po BID, Disp: 20 capsule, Rfl: 0 .  ibuprofen (ADVIL,MOTRIN) 800 MG tablet, Take 1 tablet (800 mg total) by mouth every 8 (eight) hours as needed., Disp: 30 tablet, Rfl: 0 Continue all other maintenance medications as listed above.  Follow up plan: Return in about 6 weeks (around 07/25/2017) for recheck.  Educational  handout given for pediatric asthma  Terald Sleeper PA-C [facility] [address]  [place], [place] [address] [phone]   06/13/2017, 3:27 PM

## 2017-06-13 NOTE — Progress Notes (Signed)
Cardiology Office Note   Date:  06/14/2017   ID:  Brenda Cruz, DOB 1966/09/14, MRN [id number]  PCP:  [name], [name]  Cardiologist:   Minus Breeding, MD    Chief Complaint  Patient presents with  . Coronary Artery Disease      History of Present Illness: Brenda Cruz is a 51 y.o. female who presents for follow-up of coronary disease. She has strong family history and some atypical chest pain in early 2017. She had cardiac catheterization with ostial left main stenosis and had a LIMA to the LAD. She had chest pain earlier this year and had a negative dobutamine echo in June.  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  She was in the ED on July and had chest pain.   She was seen by Dr. Harrington Challenger and this was not thought to be cardiac.  CT ruled out PE.  However, there was a small pericardial effusion noted.  However, follow up echo was normal without pericardial effusion. I reviewed these records for this visit.    She presents for follow up.  Since that visit late last month she has continued to have chest pain.  It is a constant pain.   It hurts worse lying flat and taking deep breaths. It is sharp and intense. Right now is hurting around the bases of both lungs posteriorly but it can be chest. She's been taking 800 mg Motrin 3 times daily without improvement. She's been having fevers at night. She's not describing cough. She's not having any new PND or orthopnea. She's been unable to work.  Past Medical History:  Diagnosis Date  . Anxiety   . Asthma   . DDD (degenerative disc disease)   . GERD (gastroesophageal reflux disease)   . Hemorrhoids   . HIT (heparin-induced thrombocytopenia) ([place])   . Hyperlipemia   . Hypertension   . Iron deficiency anemia 11/23/2015  . Pulmonary emboli ([place]) 12/07/2015  . TMJ (temporomandibular joint disorder)     Past Surgical History:    Procedure Laterality Date  . CARDIAC CATHETERIZATION N/A 10/29/2015   Procedure: Left Heart Cath and Coronary Angiography;  Surgeon: [name], MD;  Location: [place] [facility];  Service: Cardiovascular;  Laterality: N/A;  . CHOLECYSTECTOMY    . CORONARY ARTERY BYPASS GRAFT N/A 11/02/2015   Procedure: CORONARY ARTERY BYPASS GRAFTING (CABG)x 1 using left internal mammary artery.;  Surgeon: Melrose Nakayama, MD;  Location: [place];  Service: Open Heart Surgery;  Laterality: N/A;  . LUMBAR FUSION    . TEE WITHOUT CARDIOVERSION N/A 11/02/2015   Procedure: TRANSESOPHAGEAL ECHOCARDIOGRAM (TEE);  Surgeon: Melrose Nakayama, MD;  Location: [place];  Service: Open Heart Surgery;  Laterality: N/A;     Current Outpatient Prescriptions  Medication Sig Dispense Refill  . acetaminophen (TYLENOL) 500 MG tablet Take 1,000 mg by mouth every 6 (six) hours as needed for moderate pain.    Marland Kitchen albuterol (PROVENTIL HFA;VENTOLIN HFA) 108 (90 Base) MCG/ACT inhaler Inhale 2 puffs into the lungs every 4 (four) hours as needed for wheezing or shortness of breath. 1 Inhaler 0  . amLODipine (NORVASC) 2.5 MG tablet Take 1 tablet (2.5 mg total) by mouth daily. 90 tablet 3  . aspirin EC 81 MG tablet Take 81 mg by mouth daily.    . cefdinir (OMNICEF)  300 MG capsule Take 1 capsule (300 mg total) by mouth 2 (two) times daily. 1 po BID 20 capsule 0  . esomeprazole (NEXIUM) 40 MG capsule TAKE ONE (1) CAPSULE EACH DAY (Patient taking differently: TAKE 40MG BY MOUTH DAILY) 90 capsule 0  . ferrous sulfate 325 (65 FE) MG tablet Take 325 mg by mouth daily as needed (only takes for menses).     Marland Kitchen FLUoxetine (PROZAC) 40 MG capsule Take 1 capsule (40 mg total) by mouth daily. 30 capsule 11  . LORazepam (ATIVAN) 1 MG tablet Take 1 tablet (1 mg total) by mouth 2 (two) times daily as needed for anxiety. 60 tablet 1  . metoprolol tartrate (LOPRESSOR) 50 MG tablet TAKE ONE TABLET BY MOUTH TWICE DAILY (Patient taking differently: TAKE  25MG BY MOUTH TWICE DAILY) 180 tablet 0  . Multiple Vitamin (MULTIVITAMIN WITH MINERALS) TABS tablet Take 1 tablet by mouth daily.    . Probiotic Product (RA PROBIOTIC GUMMIES) CHEW Chew 1 each by mouth daily.    . rosuvastatin (CRESTOR) 40 MG tablet Take 1 tablet (40 mg total) by mouth daily. 30 tablet 11  . traMADol (ULTRAM) 50 MG tablet Take 1 tablet (50 mg total) by mouth every 8 (eight) hours as needed for moderate pain. 60 tablet 5  . diphenhydrAMINE (BENADRYL) 25 MG tablet Take 25 mg by mouth every 6 (six) hours as needed for itching or allergies.     No current facility-administered medications for this visit.     Allergies:   Atorvastatin; Iohexol; Heparin; Penicillins; Sulfa antibiotics; and Zithromax [azithromycin]    ROS:  Please see the history of present illness.   Otherwise, review of systems are positive for none.   All other systems are reviewed and negative.    PHYSICAL EXAM: VS:  BP 110/78   Pulse (!) 105   Ht _0  (1.727 m)   Wt 223 lb (101.2 kg)   BMI 33.91 kg/m  , BMI Body mass index is 33.91 kg/m.  GENERAL:  Well appearing NECK:  No jugular venous distention, waveform within normal limits, carotid upstroke brisk and symmetric, no bruits, no thyromegaly LUNGS:  Clear to auscultation bilaterally CHEST:  Unremarkable HEART:  PMI not displaced or sustained,S1 and S2 within normal limits, no S3, no S4, no clicks, no rubs, no murmurs ABD:  Flat, positive bowel sounds normal in frequency in pitch, no bruits, no rebound, no guarding, no midline pulsatile mass, no hepatomegaly, no splenomegaly EXT:  2 plus pulses throughout, no edema, no cyanosis no clubbing   EKG:  EKG is ordered today. Sinus tachycardia, rate 105, axis within normal limits, intervals within normal limits, nonspecific T-wave flattening diffusely. Compared to previous the rate is faster.  Recent Labs: 01/27/2017: ALT 16; TSH 1.100 05/10/2017: BUN 8; Creatinine, Ser 0.94; Hemoglobin 11.5; Platelets  330; Potassium 3.5; Sodium 136    Lipid Panel    Component Value Date/Time   CHOL 168 01/27/2017 0858   TRIG 246 (H) 01/27/2017 0858   HDL 32 (L) 01/27/2017 0858   CHOLHDL 5.3 (H) 01/27/2017 0858   CHOLHDL 8.1 10/30/2015 0300   VLDL 29 10/30/2015 0300   LDLCALC 87 01/27/2017 0858      Wt Readings from Last 3 Encounters:  06/14/17 223 lb (101.2 kg)  06/13/17 227 lb (103 kg)  05/10/17 220 lb (99.8 kg)      Other studies Reviewed: Additional studies/ records that were reviewed today include: ED records Review of the above records demonstrates:  See  above   ASSESSMENT AND PLAN:  CAD:  She had a negative dobutamine stress test.  At this point I further work up for this is planned.  The current pain is not anginal .   DYSPNEA/CHEST PAIN:  She had no effusion on echo. She has had no improvement with Motrin.  I am going to check a CRP and ESR.  If these are elevated I will treat with steroids for presumptive pleuritis or pericarditis.  She may ultimately need to see a rheumatologist.  DYSLIPIDEMIA:  In the future I will order follow up labs.      Current medicines are reviewed at length with the patient today.  The patient does not have concerns regarding medicines.  The following changes have been made:   None  Labs/ tests ordered today include:     Orders Placed This Encounter  Procedures  . Sed Rate (ESR)  . CRP High sensitivity  . EKG 12-Lead     Disposition:   FU with me in one month or sooner based on the above studies.    Signed, Minus Breeding, MD  06/14/2017 4:44 PM    [facility]

## 2017-06-13 NOTE — Patient Instructions (Signed)
Endocarditis Endocarditis is an infection of the inner layer of the heart (endocardium) or an infection of the heart valves. Endocarditis can cause growths inside the heart or on the heart valves. Over time, these growths can destroy heart tissue and cause heart failure or problems with heart rhythm. They can also cause stroke if they break away and form a blood clot in the brain. Early treatment offers the best chance for curing endocarditis and preventing complications. What are the causes? This condition may be caused by:  Germs that normally live in or on your body. The germs that most commonly cause endocarditis are bacteria.  A fungus.  What increases the risk? This condition is more likely to develop in people who have:  A heart defect.  Artificial (prosthetic) heart valves.  An abnormal or damaged heart valve.  A history of endocarditis.  Having certain procedures may also increase the risk of germs getting into the heart or bloodstream. What are the signs or symptoms? Symptoms of this condition may start suddenly, or they may start slowly and gradually get worse. Symptoms include:  Fever.  Chills.  Night sweats.  Muscle aches.  Fatigue.  Weakness.  Shortness of breath.  Chest pain.  Blood spots in the eyes.  Bleeding under the fingernails or toenails.  Painless red spots on the palms.  Painful lumps in the fingertips or toes.  Swelling in the feet or ankles.  How is this diagnosed? This condition may be diagnosed based on:  A physical exam. Your health care provider will listen to your heart to check for abnormal heart sounds (murmur). He or she may also use a scope to check for bleeding at the back of your eyes (retinas).  Tests. They may include: ? Blood tests to look for the germs that cause endocarditis. ? Imaging tests. A chest x-ray, CT scan, or echocardiogram may be used to create an image of your heart. A type of echocardiogram called a  transesophageal echocardiogram may be done to look at certain heart valves more closely.  How is this treated? Treatment for this condition depends on the cause of the endocarditis. Treatment may include:  Antibiotic medicines. These may be given through a tube into one of your veins (IV antibiotics) or taken by mouth. You may need to be on more than one antibiotic medicine.  Surgery to replace your heart valve. You may need surgery if: ? The endocarditis does not respond to treatment. ? You develop complications. ? Your heart valve is severely damaged.  Follow these instructions at home: Medicines  Take over-the-counter and prescription medicines only as told by your health care provider.  If you were prescribed an antibiotic medicine, take it as told by your health care provider. Do not stop taking the antibiotic even if you start to feel better. You may need to be on intravenous antibiotics for several weeks.  Do not use IV drugs unless it is part of your medical treatment. Lifestyle  Do not get tattoos or body piercings.  Practice good oral hygiene. This includes: ? Brushing and flossing regularly. ? Scheduling routine dental appointments.  Do not use any products that contain nicotine or tobacco, such as cigarettes and e-cigarettes. If you need help quitting, ask your health care provider.  Limit alcohol intake to no more than 1 drink a day for nonpregnant women and 2 drinks a day for men. One drink equals 12 oz of beer, 5 oz of wine, or 1 oz of hard liquor.  General instructions  Let your health care provider know before you have any dental or surgical procedures. You may need to take antibiotics before the procedure.  Tell all of your health care providers, including your dentist, that you have had endocarditis.  Gradually resume your usual activities.  Keep all follow-up visits as told by your health care provider. This is important. Contact a health care provider  if:  You have a fever.  Your symptoms do not improve.  Your symptoms get worse.  Your symptoms come back. Get help right away if:  You have trouble breathing.  You have chest pain.  You have symptoms of a stroke. These include: ? Sudden weakness. ? Numbness. ? Confusion. ? Trouble talking or understanding. ? A severe headache. Summary  Endocarditis is an infection of the inner layer of the heart (endocardium) or heart valves. It is caused by bacteria or a fungus.  Having certain heart conditions or procedures may increase the risk of endocarditis.  Antibiotics are an important treatment for endocarditis. Take these medicines as told by your health care provider. Do not stop taking them even if you start to feel better.  Tell all of your health care providers, including your dentist, that you have had endocarditis. This information is not intended to replace advice given to you by your health care provider. Make sure you discuss any questions you have with your health care provider. Document Released: 10/03/2005 Document Revised: 07/15/2016 Document Reviewed: 07/15/2016 Elsevier Interactive Patient Education  [employer].

## 2017-06-14 ENCOUNTER — Other Ambulatory Visit: Payer: PRIVATE HEALTH INSURANCE

## 2017-06-14 ENCOUNTER — Telehealth: Payer: Self-pay | Admitting: Nurse Practitioner

## 2017-06-14 ENCOUNTER — Encounter: Payer: Self-pay | Admitting: Cardiology

## 2017-06-14 ENCOUNTER — Ambulatory Visit (INDEPENDENT_AMBULATORY_CARE_PROVIDER_SITE_OTHER): Payer: PRIVATE HEALTH INSURANCE | Admitting: Cardiology

## 2017-06-14 VITALS — BP 110/78 | HR 105 | Ht 68.0 in | Wt 223.0 lb

## 2017-06-14 DIAGNOSIS — I2510 Atherosclerotic heart disease of native coronary artery without angina pectoris: Secondary | ICD-10-CM

## 2017-06-14 DIAGNOSIS — I10 Essential (primary) hypertension: Secondary | ICD-10-CM | POA: Diagnosis not present

## 2017-06-14 DIAGNOSIS — R079 Chest pain, unspecified: Secondary | ICD-10-CM | POA: Diagnosis not present

## 2017-06-14 NOTE — Addendum Note (Signed)
Addended by: Shellia Cleverly on: 06/14/2017 04:54 PM   Modules accepted: Orders

## 2017-06-14 NOTE — Patient Instructions (Signed)
Medication Instructions:  The current medical regimen is effective;  continue present plan and medications.  Labwork: Please have blood work today at [facility].  Follow-Up: Follow up in 1 month with Dr Percival Spanish.  If you need a refill on your cardiac medications before your next appointment, please call your pharmacy.  Thank you for choosing [facility]!!

## 2017-06-15 LAB — HIGH SENSITIVITY CRP: CRP, High Sensitivity: 216.55 mg/L — ABNORMAL HIGH (ref 0.00–3.00)

## 2017-06-15 LAB — D-DIMER, QUANTITATIVE: D-DIMER: 2.56 mg{FEU}/L — ABNORMAL HIGH (ref 0.00–0.49)

## 2017-06-15 LAB — SEDIMENTATION RATE: SED RATE: 42 mm/h — AB (ref 0–32)

## 2017-06-15 NOTE — Telephone Encounter (Signed)
Out of work till Tuesday- saw Dr. Warren Lacy yesterday

## 2017-06-16 ENCOUNTER — Telehealth: Payer: Self-pay | Admitting: Cardiology

## 2017-06-16 NOTE — Telephone Encounter (Signed)
Returned call. Patient aware I will call once recommendations received & that Dr. Percival Spanish is aware of her concerns. Caller voiced understanding and thanks.

## 2017-06-16 NOTE — Telephone Encounter (Signed)
Spoke to Ms Moller. She got copy of her results from [facility] showing abnormal results for her D-Dimer, CRP, ESR. Needing recommendations from Dr. Percival Spanish.   Dr. Rosezella Florida note from her visit 2 days ago:  "DYSPNEA/CHEST PAIN:  She had no effusion on echo. She has had no improvement with Motrin.  I am going to check a CRP and ESR.  If these are elevated I will treat with steroids for presumptive pleuritis or pericarditis.  She may ultimately need to see a rheumatologist."  Pt expresses she has continued to lay around in bed this week, in a lot of pain. She had sister speak to me as well. Sister states she is an Health visitor and is concerned that the patient needs to be in hospital. Informed them I would discuss w Dr. Percival Spanish and see what recommendations are advised. Pt verbalized understanding and thanks.

## 2017-06-16 NOTE — Telephone Encounter (Signed)
°  New Prob  Has some questions regarding moving forward with her care regarding elevated lab results. Please call.

## 2017-06-16 NOTE — Telephone Encounter (Signed)
Had discussed w Dr. Percival Spanish earlier who informed me he would review labwork, patient was made aware I was waiting for recommendations.

## 2017-06-16 NOTE — Telephone Encounter (Signed)
Left msg for Dr. Percival Spanish to call if able to advise.

## 2017-06-16 NOTE — Telephone Encounter (Signed)
Follow Up   Pt sister states that she wants a call back about this and that the pt feels neglected. And wants someone else to give her a call back

## 2017-06-17 ENCOUNTER — Telehealth: Payer: Self-pay | Admitting: Cardiology

## 2017-06-17 ENCOUNTER — Emergency Department (HOSPITAL_COMMUNITY)
Admission: EM | Admit: 2017-06-17 | Discharge: 2017-06-17 | Disposition: A | Discharge status: Home / Self Care | Payer: No Typology Code available for payment source | Attending: Emergency Medicine | Admitting: Emergency Medicine

## 2017-06-17 ENCOUNTER — Emergency Department (HOSPITAL_COMMUNITY): Payer: No Typology Code available for payment source

## 2017-06-17 ENCOUNTER — Encounter (HOSPITAL_COMMUNITY): Payer: Self-pay | Admitting: Emergency Medicine

## 2017-06-17 DIAGNOSIS — J45909 Unspecified asthma, uncomplicated: Secondary | ICD-10-CM | POA: Insufficient documentation

## 2017-06-17 DIAGNOSIS — Z87891 Personal history of nicotine dependence: Secondary | ICD-10-CM | POA: Diagnosis not present

## 2017-06-17 DIAGNOSIS — I2510 Atherosclerotic heart disease of native coronary artery without angina pectoris: Secondary | ICD-10-CM | POA: Diagnosis not present

## 2017-06-17 DIAGNOSIS — Z7982 Long term (current) use of aspirin: Secondary | ICD-10-CM | POA: Insufficient documentation

## 2017-06-17 DIAGNOSIS — J449 Chronic obstructive pulmonary disease, unspecified: Secondary | ICD-10-CM | POA: Insufficient documentation

## 2017-06-17 DIAGNOSIS — I10 Essential (primary) hypertension: Secondary | ICD-10-CM | POA: Insufficient documentation

## 2017-06-17 DIAGNOSIS — R079 Chest pain, unspecified: Secondary | ICD-10-CM | POA: Diagnosis present

## 2017-06-17 DIAGNOSIS — Z79899 Other long term (current) drug therapy: Secondary | ICD-10-CM | POA: Insufficient documentation

## 2017-06-17 DIAGNOSIS — R0789 Other chest pain: Secondary | ICD-10-CM | POA: Diagnosis not present

## 2017-06-17 LAB — BASIC METABOLIC PANEL
Anion gap: 9 (ref 5–15)
BUN: 9 mg/dL (ref 6–20)
CHLORIDE: 102 mmol/L (ref 101–111)
CO2: 25 mmol/L (ref 22–32)
CREATININE: 1.01 mg/dL — AB (ref 0.44–1.00)
Calcium: 9 mg/dL (ref 8.9–10.3)
GFR calc Af Amer: >60 mL/min (ref >60)
GFR calc non Af Amer: >60 mL/min (ref >60)
Glucose, Bld: 167 mg/dL — ABNORMAL HIGH (ref 65–99)
Potassium: 4 mmol/L (ref 3.5–5.1)
SODIUM: 136 mmol/L (ref 135–145)

## 2017-06-17 LAB — CBC
HCT: 33.5 % — ABNORMAL LOW (ref 36.0–46.0)
Hemoglobin: 11 g/dL — ABNORMAL LOW (ref 12.0–15.0)
MCH: 29.7 pg (ref 26.0–34.0)
MCHC: 32.8 g/dL (ref 30.0–36.0)
MCV: 90.5 fL (ref 78.0–100.0)
PLATELETS: 522 10*3/uL — AB (ref 150–400)
RBC: 3.7 MIL/uL — ABNORMAL LOW (ref 3.87–5.11)
RDW: 12.9 % (ref 11.5–15.5)
WBC: 17.2 10*3/uL — AB (ref 4.0–10.5)

## 2017-06-17 LAB — I-STAT TROPONIN, ED: Troponin i, poc: 0 ng/mL (ref 0.00–0.08)

## 2017-06-17 MED ORDER — SODIUM CHLORIDE 0.9 % IV SOLN
200.0000 mg | Freq: Once | INTRAVENOUS | Status: AC
  Administered 2017-06-17: 200 mg via INTRAVENOUS
  Filled 2017-06-17: qty 200

## 2017-06-17 MED ORDER — DIPHENHYDRAMINE HCL 50 MG/ML IJ SOLN: 50.0000 mg | Freq: Once | INTRAMUSCULAR | Status: DC

## 2017-06-17 MED ORDER — METHOCARBAMOL 750 MG PO TABS: 750.0000 mg | ORAL_TABLET | Freq: Four times a day (QID) | ORAL | 0 refills | Status: DC

## 2017-06-17 MED ORDER — HYDROCODONE-ACETAMINOPHEN 5-325 MG PO TABS: 2.0000 | ORAL_TABLET | ORAL | 0 refills | Status: DC | PRN

## 2017-06-17 MED ORDER — PREDNISONE 10 MG (21) PO TBPK: ORAL_TABLET | Freq: Every day | ORAL | 0 refills | Status: DC

## 2017-06-17 NOTE — ED Triage Notes (Signed)
Pt arrives to ED after having abnormal labs at MD office per pt D dimer was elevated. Pt states for the past week she has pain with deep breaths and CP when walking.

## 2017-06-17 NOTE — ED Provider Notes (Addendum)
I saw and evaluated the patient, reviewed the resident's note and I agree with the findings and plan.   EKG Interpretation  Date/Time:  Saturday June 17 2017 12:24:17 EDT Ventricular Rate:  73 PR Interval:  142 QRS Duration: 82 QT Interval:  380 QTC Calculation: 418 R Axis:   32 Text Interpretation:  Normal sinus rhythm Normal ECG Confirmed by Lacretia Leigh ([id number]) on 06/17/2017 2:01:21 PM     51 year old female presents with right-sided lower costal margin pain 2 weeks that's worse with position and better with remaining still. No hemoptysis. Did self medicate with prednisone 24 hours which did help. Saw her Dr. few days ago and blood work was done and an elevated d-dimer was noted. Was sent here for further evaluation. She also has a history of PE in the past. Denies any leg pain or swelling. No anginal symptoms. EKG is unchanged. Patient is pinpoint tender at the right lower rib margin. No rashes to the skin noted. Will order chest CT results are pending.  3:37 PM I had a long discussion with the patient and her husband. I also discussed the case with the patient's cardiologist, Dr. Percival Spanish. Patient's symptoms are very atypical for PE. Does have a leukocytosis which is likely from the prednisone which she has taken 2. He has no fever or chills. Shared decision-making was done and he agrees to not do the CT of the chest at this time. She is not hypoxic and not tachycardic. They understand that this could be a pulmonary embolism and they accept this. Per the cardiology recommendation, patient be placed on muscle relaxants as well as prednisone. Should return precautions given.   Lacretia Leigh, MD 06/17/17 1402    Lacretia Leigh, MD 06/17/17 1538

## 2017-06-17 NOTE — Telephone Encounter (Signed)
Pt called c/o chest pain- pleuritic. Her D-dimer was elevated. I suggested she come to the ED for a CTA.  Kerin Ransom PA-C 06/17/2017 10:58 AM

## 2017-06-17 NOTE — Discharge Instructions (Signed)
Please follow up with your PCP regarding your symptoms and recent ED visit. Please return to the ED if your symptoms worsen or if you develop new onset chest pain, SOB, diaphoresis, and nausea/vomiting.

## 2017-06-17 NOTE — ED Provider Notes (Signed)
[facility] DEPT Provider Note   CSN: [id number] Arrival date & time: 06/17/17  1224   History   Chief Complaint Chief Complaint  Patient presents with  . Shortness of Breath    HPI Brenda Cruz is a 51 y.o. female.  The patient has a PMH of anxiety, asthma, HLD, HTN, CAD s/p CABG, and hx of PE who presents with a two week history of worsening right chest wall pain. She states that the pain has occurred intermittently for the past two weeks. It most often occurs while she is sleeping, taking a deep breath, and walking long distances. She states that she has been unable to sleep because of her pain and shortness of breath. She states that she has been very worried about this pain and has seen her PCP and cardiologist. They reassured her that her chest pain was non-cardiac as she did not have new changes on EKG. They did a workup which she found out revealed an elevated CRP  and D-dimer. Given her history of PE, the patient was told to come to the ED for evaluation.      Past Medical History:  Diagnosis Date  . Anxiety   . Asthma   . DDD (degenerative disc disease)   . GERD (gastroesophageal reflux disease)   . Hemorrhoids   . HIT (heparin-induced thrombocytopenia) ([place])   . Hyperlipemia   . Hypertension   . Iron deficiency anemia 11/23/2015  . Pulmonary emboli ([place]) 12/07/2015  . TMJ (temporomandibular joint disorder)     Patient Active Problem List   Diagnosis Date Noted  . Streptococcal infection 06/13/2017  . Hyperlipidemia 03/15/2017  . Fatigue 01/27/2017  . Snoring 01/27/2017  . Apnea 01/27/2017  . Stress incontinence of urine 12/07/2016  . COPD exacerbation ([place]) 12/07/2016  . ACE-inhibitor cough 12/07/2016  . Breast screening 05/31/2016  . Dyslipidemia 12/11/2015  . 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 Hx of pulmonary embolus 12/07/2015  . Iron deficiency anemia 11/23/2015  . HCAP (healthcare-associated pneumonia) 11/15/2015  . AKI  (acute kidney injury) ([place]) 11/15/2015  . S/P CABG x 1 11/02/2015  . Coronary artery disease involving native coronary artery of native heart without angina pectoris   . Chest pain 10/29/2015  . Coronary artery disease due to lipid rich plaque 10/29/2015  . Chest pain, exertional   . Essential hypertension 10/16/2015  . Morbid obesity ([place]) 10/16/2015  . GERD (gastroesophageal reflux disease) 05/16/2013  . Depression 05/16/2013  . GAD (generalized anxiety disorder) 05/16/2013    Past Surgical History:  Procedure Laterality Date  . CARDIAC CATHETERIZATION N/A 10/29/2015   Procedure: Left Heart Cath and Coronary Angiography;  Surgeon: [name], MD;  Location: [facility];  Service: Cardiovascular;  Laterality: N/A;  . CHOLECYSTECTOMY    . CORONARY ARTERY BYPASS GRAFT N/A 11/02/2015   Procedure: CORONARY ARTERY BYPASS GRAFTING (CABG)x 1 using left internal mammary artery.;  Surgeon: Melrose Nakayama, MD;  Location: [facility];  Service: Open Heart Surgery;  Laterality: N/A;  . LUMBAR FUSION    . TEE WITHOUT CARDIOVERSION N/A 11/02/2015   Procedure: TRANSESOPHAGEAL ECHOCARDIOGRAM (TEE);  Surgeon: Melrose Nakayama, MD;  Location: [facility];  Service: Open Heart Surgery;  Laterality: N/A;    OB History    No data available       Home Medications    Prior to Admission medications   Medication Sig Start Date End Date Taking? Authorizing Provider  acetaminophen (TYLENOL) 500 MG tablet Take 1,000 mg  by mouth every 6 (six) hours as needed for moderate pain.    [provider]  albuterol (PROVENTIL HFA;VENTOLIN HFA) 108 (90 Base) MCG/ACT inhaler Inhale 2 puffs into the lungs every 4 (four) hours as needed for wheezing or shortness of breath. 05/02/17   Evelina Dun A, [name]  amLODipine (NORVASC) 2.5 MG tablet Take 1 tablet (2.5 mg total) by mouth daily. 12/07/16   Terald Sleeper, PA-C  aspirin EC 81 MG tablet Take 81 mg by mouth daily.    [provider]    cefdinir (OMNICEF) 300 MG capsule Take 1 capsule (300 mg total) by mouth 2 (two) times daily. 1 po BID 06/13/17   Terald Sleeper, PA-C  diphenhydrAMINE (BENADRYL) 25 MG tablet Take 25 mg by mouth every 6 (six) hours as needed for itching or allergies.    [provider]  esomeprazole (NEXIUM) 40 MG capsule TAKE ONE (1) CAPSULE EACH DAY Patient taking differently: TAKE 40MG  BY MOUTH DAILY 05/03/17   Terald Sleeper, PA-C  ferrous sulfate 325 (65 FE) MG tablet Take 325 mg by mouth daily as needed (only takes for menses).     [provider]  FLUoxetine (PROZAC) 40 MG capsule Take 1 capsule (40 mg total) by mouth daily. 12/07/16   Terald Sleeper, PA-C  LORazepam (ATIVAN) 1 MG tablet Take 1 tablet (1 mg total) by mouth 2 (two) times daily as needed for anxiety. 02/24/17   [name] [name], [name]  metoprolol tartrate (LOPRESSOR) 50 MG tablet TAKE ONE TABLET BY MOUTH TWICE DAILY Patient taking differently: TAKE 25MG  BY MOUTH TWICE DAILY 05/02/17   [name], MD  Multiple Vitamin (MULTIVITAMIN WITH MINERALS) TABS tablet Take 1 tablet by mouth daily.    [provider]  Probiotic Product (RA PROBIOTIC GUMMIES) CHEW Chew 1 each by mouth daily.    [provider]  rosuvastatin (CRESTOR) 40 MG tablet Take 1 tablet (40 mg total) by mouth daily. 02/01/17   Minus Breeding, MD  traMADol (ULTRAM) 50 MG tablet Take 1 tablet (50 mg total) by mouth every 8 (eight) hours as needed for moderate pain. 01/27/17   Terald Sleeper, PA-C    Family History Family History  Problem Relation Age of Onset  . Colon polyps Father   . Stomach cancer Paternal Grandmother   . Esophageal cancer Maternal Grandfather   . Breast cancer Mother   . Ovarian cancer Unknown        Maternal Side Great  Aunt  . Colon cancer Neg Hx     Social History Social History  Substance Use Topics  . Smoking status: Former Research scientist (life sciences)  . Smokeless tobacco: Never Used     Comment: Counseling paper for  smoking given to patient in exam room   . Alcohol use No     Allergies   Atorvastatin; Iohexol; Heparin; Penicillins; Sulfa antibiotics; and Zithromax [azithromycin]   Review of Systems Review of Systems  Constitutional: Positive for diaphoresis and fatigue. Negative for appetite change, chills and fever.  HENT: Negative for congestion, ear pain and sinus pressure.   Respiratory: Positive for cough (intermittent, nonproductive) and shortness of breath (with exertion).   Cardiovascular: Positive for chest pain (R chest wall pain). Negative for palpitations and leg swelling.  Gastrointestinal: Negative for abdominal distention, abdominal pain, blood in stool, constipation, diarrhea and nausea.  Genitourinary: Negative for dysuria and flank pain.  Neurological: Positive for light-headedness and headaches.   Physical Exam Updated Vital Signs BP 101/66  Pulse (!) 59   Temp 98 F (36.7 C) (Oral)   Resp 15   Ht 5\' 8"  (1.727 m)   Wt 101.2 kg (223 lb)   LMP 06/03/2017 (Exact Date)   SpO2 99%   BMI 33.91 kg/m   Physical Exam  Constitutional: She appears well-developed and well-nourished. No distress.  HENT:  Mouth/Throat: Oropharynx is clear and moist. No oropharyngeal exudate.  Neck: No JVD present.  Cardiovascular: Normal rate and regular rhythm.  Exam reveals no friction rub.   No murmur heard. Pulmonary/Chest: Effort normal. No respiratory distress. She has no wheezes.  No crackles  Abdominal: Soft. She exhibits no distension. There is no tenderness.  Musculoskeletal: She exhibits no edema (of bilateral lower extremities) or tenderness (of bilateral lower extremities).  Lymphadenopathy:    She has no cervical adenopathy.  Skin: Skin is warm and dry. Capillary refill takes less than 2 seconds. No rash noted. She is not diaphoretic. No erythema.    ED Treatments / Results  Labs (all labs ordered are listed, but only abnormal results are displayed) Labs Reviewed  BASIC  METABOLIC PANEL - Abnormal; Notable for the following:       Result Value   Glucose, Bld 167 (*)    Creatinine, Ser 1.01 (*)    All other components within normal limits  CBC - Abnormal; Notable for the following:    WBC 17.2 (*)    RBC 3.70 (*)    Hemoglobin 11.0 (*)    HCT 33.5 (*)    Platelets 522 (*)    All other components within normal limits  I-STAT TROPONIN, ED    EKG  EKG Interpretation  Date/Time:  Saturday June 17 2017 12:24:17 EDT Ventricular Rate:  73 PR Interval:  142 QRS Duration: 82 QT Interval:  380 QTC Calculation: 418 R Axis:   32 Text Interpretation:  Normal sinus rhythm Normal ECG Confirmed by Lacretia Leigh ([id number]) on 06/17/2017 2:01:28 PM       Radiology Dg Chest 2 View  Result Date: 06/17/2017 CLINICAL DATA:  Dyspnea and chest pain EXAM: CHEST  2 VIEW COMPARISON:  05/10/2017 chest radiograph. FINDINGS: Intact sternotomy wires. Stable cardiomediastinal silhouette with top-normal heart size. No pneumothorax. No pleural effusion. Lungs appear clear, with no acute consolidative airspace disease and no pulmonary edema. Cholecystectomy clips are seen in the right upper quadrant of the abdomen. IMPRESSION: No active cardiopulmonary disease. Electronically Signed   By: Ilona Sorrel M.D.   On: 06/17/2017 14:32    Procedures Procedures (including critical care time)  Medications Ordered in ED Medications  methylPREDNISolone sodium succinate (SOLU-MEDROL) 200 mg in sodium chloride 0.9 % 50 mL IVPB (200 mg Intravenous New Bag/Given 06/17/17 1438)  diphenhydrAMINE (BENADRYL) injection 50 mg (not administered)     Initial Impression / Assessment and Plan / ED Course  I have reviewed the triage vital signs and the nursing notes.  Pertinent labs & imaging results that were available during my care of the patient were reviewed by me and considered in my medical decision making (see chart for details).  Patient initially concerned about PE, as she stated that  this is how her blood clots presented in the past. She denies any unilateral lower extremity swelling, risk factors for PE including recent travel. Her vital signs are stable, within normal limits. She has low pre test probability for PE and, given her chronic symptoms and physical exam, I suspect her positive D-Dimer may be a false positive. Will premedicate for CT  angio, as patient has contrast allergy, for suspected PE rule-out.   The patient's pain appears to be more musculoskeletal, as the patient exhibited reproducible point tenderness on exam. It is most likely that this is the cause of her symptoms and she will require medications, including NSAIDs, and follow up with PCP.  Final Clinical Impressions(s) / ED Diagnoses   Final diagnoses:  None   After discussion with cardiologist and attending physician, patient decided to leave without CT angio to rule out PE. She was discharged with pain medications and return precautions. She was told to follow up with primary care physician regarding her ED visit and her symptoms.  New Prescriptions New Prescriptions   No medications on file     Thomasene Ripple, MD 06/17/17 1546

## 2017-06-17 NOTE — ED Notes (Signed)
Pt to xray

## 2017-06-20 ENCOUNTER — Telehealth: Payer: Self-pay | Admitting: Nurse Practitioner

## 2017-06-20 ENCOUNTER — Other Ambulatory Visit: Payer: Self-pay | Admitting: Hematology and Oncology

## 2017-06-20 ENCOUNTER — Telehealth: Payer: Self-pay | Admitting: *Deleted

## 2017-06-20 MED ORDER — RIVAROXABAN 20 MG PO TABS: 20.0000 mg | ORAL_TABLET | Freq: Every day | ORAL | 6 refills | Status: DC

## 2017-06-20 NOTE — Telephone Encounter (Signed)
Pt called wanting advice from Dr Alvy Bimler. States she has been having trouble with her blood work- WBC elevated (on antibiotics), platelets elevated (on high dose steroids) and is having swelling. "Dr Alvy Bimler is the smartest person I know, she has saved my life before and I want her advice. Should I see her or a rheumatologist as suggested by cardiologist? " Pt tearful, states she is scared.

## 2017-06-20 NOTE — Telephone Encounter (Signed)
Patient is on steroids and is feeling better- even though her d dimer was elevated her o2 sat was 99%. She is not sob currently and chest wall pain seems soime better. Going to wait and see how sheis in morning if no better will order stat CT of chest to rule out PE.

## 2017-06-20 NOTE — Telephone Encounter (Signed)
Spoke with patient about recent labwork and suggestions from other physicians. Would like MMMs opinion after she reviews chart

## 2017-06-20 NOTE — Telephone Encounter (Signed)
Notified that Dr Alvy Bimler thinks seeing the rheumatologist is a good idea. Since she is high risk for recurrent blood clot, she can resume xarelto for 1 month. We will follow up with her in ~ 30 days.. Pt states she does want to resume xarelto.

## 2017-06-21 ENCOUNTER — Telehealth: Payer: Self-pay | Admitting: Nurse Practitioner

## 2017-06-21 NOTE — Telephone Encounter (Signed)
appt scheduled Pt notified 

## 2017-06-23 ENCOUNTER — Encounter: Payer: Self-pay | Admitting: Nurse Practitioner

## 2017-06-23 ENCOUNTER — Ambulatory Visit (INDEPENDENT_AMBULATORY_CARE_PROVIDER_SITE_OTHER): Payer: PRIVATE HEALTH INSURANCE | Admitting: Nurse Practitioner

## 2017-06-23 VITALS — BP 123/75 | HR 76 | Temp 97.7°F | Ht 68.0 in | Wt 227.0 lb

## 2017-06-23 DIAGNOSIS — F339 Major depressive disorder, recurrent, unspecified: Secondary | ICD-10-CM

## 2017-06-23 DIAGNOSIS — S86899A Other injury of other muscle(s) and tendon(s) at lower leg level, unspecified leg, initial encounter: Secondary | ICD-10-CM | POA: Diagnosis not present

## 2017-06-23 DIAGNOSIS — M545 Low back pain, unspecified: Secondary | ICD-10-CM

## 2017-06-23 DIAGNOSIS — F3341 Major depressive disorder, recurrent, in partial remission: Secondary | ICD-10-CM | POA: Diagnosis not present

## 2017-06-23 DIAGNOSIS — R42 Dizziness and giddiness: Secondary | ICD-10-CM

## 2017-06-23 DIAGNOSIS — R413 Other amnesia: Secondary | ICD-10-CM

## 2017-06-23 LAB — URINALYSIS, COMPLETE
Bilirubin, UA: NEGATIVE
Glucose, UA: NEGATIVE
Ketones, UA: NEGATIVE
Leukocytes, UA: NEGATIVE
NITRITE UA: NEGATIVE
PH UA: 6.5 (ref 5.0–7.5)
PROTEIN UA: NEGATIVE
RBC, UA: NEGATIVE
Specific Gravity, UA: <1.005 — ABNORMAL LOW (ref 1.005–1.030)
UUROB: 0.2 mg/dL (ref 0.2–1.0)

## 2017-06-23 LAB — MICROSCOPIC EXAMINATION
Bacteria, UA: NONE SEEN
Epithelial Cells (non renal): >10 /hpf — AB (ref 0–10)
RBC, UA: NONE SEEN /hpf (ref 0–?)
Renal Epithel, UA: NONE SEEN /hpf
WBC UA: NONE SEEN /HPF (ref 0–?)

## 2017-06-23 MED ORDER — ESCITALOPRAM OXALATE 20 MG PO TABS: 20.0000 mg | ORAL_TABLET | Freq: Every day | ORAL | 5 refills | Status: DC

## 2017-06-23 NOTE — Addendum Note (Signed)
Addended by: [name] on: 06/23/2017 03:33 PM   Modules accepted: Orders

## 2017-06-23 NOTE — Progress Notes (Signed)
   Subjective:    Patient ID: Brenda Cruz, female    DOB: 1966-02-14, 51 y.o.   MRN: [id number]  HPI  Patient has not been feeling well lately- she has been having chest pain and there have been arguments among providers outside of this office as to whether she had a pulmonary emboli. It was finally decided that she had pleurisy and she was given steroids and omnicef. The chest discomfort has improved. But today she has had lower back pain with some dysuria. She also has pain in front part of both lower legs. Hurts mainly when walking. SHe also says she is depressed because she feels so bad all of the time. She is on prozac and has been for awhile. She says she is also having trouble focusing. Depression screen [facility] 2/9 06/23/2017 06/23/2017 06/13/2017 04/24/2017 01/27/2017  Decreased Interest 1 0 3 0 1  Down, Depressed, Hopeless 3 0 3 1 1   PHQ - 2 Score 4 0 6 1 2   Altered sleeping 3 - 3 - 1  Tired, decreased energy 3 - 3 - 1  Change in appetite 0 - 0 - 0  Feeling bad or failure about yourself  1 - 3 - 0  Trouble concentrating 3 - 0 - 0  Moving slowly or fidgety/restless 0 - 0 - 0  Suicidal thoughts 0 - 0 - 0  PHQ-9 Score 14 - 15 - 4  Difficult doing work/chores Somewhat difficult - - - -    * recent labs show some type of inflammatory process- they want her referred to rheumatology   Review of Systems  Constitutional: Negative.   HENT: Negative.   Respiratory: Positive for shortness of breath (has improved).   Cardiovascular: Negative.   Genitourinary: Negative.   Musculoskeletal: Negative.   Neurological: Positive for dizziness.  Psychiatric/Behavioral: Negative.   All other systems reviewed and are negative.      Objective:   Physical Exam  Constitutional: She is oriented to person, place, and time. She appears well-developed and well-nourished. She appears distressed.  Cardiovascular: Normal rate and regular rhythm.   Pulmonary/Chest: Effort normal and breath sounds normal.    Neurological: She is alert and oriented to person, place, and time.  Skin: Skin is warm.  Psychiatric: She has a normal mood and affect. Her behavior is normal. Judgment and thought content normal.    BP 123/75   Pulse 76   Temp 97.7 F (36.5 C) (Oral)   Ht 5\' 8"  (1.727 m)   Wt 227 lb (103 kg)   LMP 06/03/2017 (Exact Date)   BMI 34.52 kg/m   Urine clear     Assessment & Plan:  1. Acute bilateral low back pain without sciatica Urine clear - Urinalysis, Complete - High sensitivity CRP  2. Shin splints, unspecified laterality, initial encounter Moist heat Leg stretches  3. Dizziness Force fluids - Ambulatory referral to Rheumatology - CBC with Differential/Platelet  4. Recurrent major depressive disorder, in partial remission ([place]) See below  5. Memory disturbance Will do neuro referral if continues - Ambulatory referral to Rheumatology  6. Depression, recurrent ([place]) Stop prozac and start lexapro - escitalopram (LEXAPRO) 20 MG tablet; Take 1 tablet (20 mg total) by mouth daily.  Dispense: 30 tablet; Refill: [place], [name]

## 2017-06-23 NOTE — Addendum Note (Signed)
Addended by: [name] on: 06/23/2017 03:38 PM   Modules accepted: Orders

## 2017-06-23 NOTE — Patient Instructions (Signed)

## 2017-06-24 LAB — ANA COMPREHENSIVE PANEL
Centromere Ab Screen: <0.2 AI (ref 0.0–0.9)
DSDNA AB: 1 [IU]/mL (ref 0–9)
ENA RNP Ab: <0.2 AI (ref 0.0–0.9)
ENA SSB (LA) Ab: <0.2 AI (ref 0.0–0.9)

## 2017-06-24 LAB — ARTHRITIS PANEL
BASOS ABS: 0 10*3/uL (ref 0.0–0.2)
Basos: 0 %
EOS (ABSOLUTE): 0.5 10*3/uL — AB (ref 0.0–0.4)
Eos: 3 %
HEMATOCRIT: 36.3 % (ref 34.0–46.6)
HEMOGLOBIN: 11.7 g/dL (ref 11.1–15.9)
IMMATURE GRANULOCYTES: 3 %
Immature Grans (Abs): 0.5 10*3/uL — ABNORMAL HIGH (ref 0.0–0.1)
LYMPHS ABS: 4.7 10*3/uL — AB (ref 0.7–3.1)
Lymphs: 27 %
MCH: 29.2 pg (ref 26.6–33.0)
MCHC: 32.2 g/dL (ref 31.5–35.7)
MCV: 91 fL (ref 79–97)
MONOS ABS: 1.4 10*3/uL — AB (ref 0.1–0.9)
Monocytes: 8 %
NEUTROS PCT: 59 %
Neutrophils Absolute: 10.3 10*3/uL — ABNORMAL HIGH (ref 1.4–7.0)
PLATELETS: 489 10*3/uL — AB (ref 150–379)
RBC: 4.01 x10E6/uL (ref 3.77–5.28)
RDW: 13.9 % (ref 12.3–15.4)
Sed Rate: 30 mm/hr (ref 0–32)
URIC ACID: 5.4 mg/dL (ref 2.5–7.1)
WBC: 17.5 10*3/uL — ABNORMAL HIGH (ref 3.4–10.8)

## 2017-06-24 LAB — HIGH SENSITIVITY CRP: CRP HIGH SENSITIVITY: 3.86 mg/L — AB (ref 0.00–3.00)

## 2017-06-24 LAB — D-DIMER, QUANTITATIVE: D-DIMER: 0.97 mg/L FEU — ABNORMAL HIGH (ref 0.00–0.49)

## 2017-06-26 ENCOUNTER — Other Ambulatory Visit: Payer: Self-pay | Admitting: Physician Assistant

## 2017-06-26 ENCOUNTER — Other Ambulatory Visit: Payer: Self-pay | Admitting: Nurse Practitioner

## 2017-06-26 DIAGNOSIS — I10 Essential (primary) hypertension: Secondary | ICD-10-CM

## 2017-06-26 MED ORDER — DOXYCYCLINE HYCLATE 100 MG PO TABS: 100.0000 mg | ORAL_TABLET | Freq: Two times a day (BID) | ORAL | 0 refills | Status: DC

## 2017-06-28 ENCOUNTER — Other Ambulatory Visit: Payer: Self-pay | Admitting: *Deleted

## 2017-06-28 NOTE — Telephone Encounter (Signed)
Please call in lorazepam with 1 refills 

## 2017-06-28 NOTE — Telephone Encounter (Signed)
Xanax refill called in. 

## 2017-07-19 ENCOUNTER — Ambulatory Visit: Payer: PRIVATE HEALTH INSURANCE | Admitting: Cardiology

## 2017-07-20 ENCOUNTER — Telehealth: Payer: Self-pay

## 2017-07-20 NOTE — Telephone Encounter (Signed)
Called and left message for patient to call nurse back and give [place] a update.

## 2017-07-20 NOTE — Telephone Encounter (Signed)
-----   Message from Heath Lark, MD sent at 07/20/2017  8:39 AM EDT ----- Regarding: call her for follow-up pls check on Tammi's last notes Call her and ask how she is doing  Thanks

## 2017-07-25 ENCOUNTER — Telehealth: Payer: Self-pay | Admitting: *Deleted

## 2017-07-25 NOTE — Telephone Encounter (Signed)
States she was put on antibiotics. They think it was a possible tic bite. She is doing better now.

## 2017-07-25 NOTE — Telephone Encounter (Signed)
-----   Message from Flo Shanks, RN sent at 07/21/2017  4:19 PM EDT ----- Regarding: FW: call her for follow-up   ----- Message ----- From: Heath Lark, MD Sent: 07/20/2017   8:39 AM To: Flo Shanks, RN Subject: call her for follow-up                         pls check on Brenda Cruz's last notes Call her and ask how she is doing  Thanks

## 2017-08-26 ENCOUNTER — Other Ambulatory Visit: Payer: Self-pay | Admitting: Physician Assistant

## 2017-08-28 ENCOUNTER — Encounter: Payer: Self-pay | Admitting: Physician Assistant

## 2017-08-28 ENCOUNTER — Ambulatory Visit (INDEPENDENT_AMBULATORY_CARE_PROVIDER_SITE_OTHER): Payer: PRIVATE HEALTH INSURANCE | Admitting: Physician Assistant

## 2017-08-28 VITALS — BP 134/78 | HR 85 | Temp 98.6°F | Ht 68.0 in | Wt 235.8 lb

## 2017-08-28 DIAGNOSIS — Z01419 Encounter for gynecological examination (general) (routine) without abnormal findings: Secondary | ICD-10-CM

## 2017-08-28 MED ORDER — GABAPENTIN 100 MG PO CAPS: 100.0000 mg | ORAL_CAPSULE | Freq: Every day | ORAL | 3 refills | Status: DC

## 2017-08-28 MED ORDER — FLUOXETINE HCL 40 MG PO CAPS: 40.0000 mg | ORAL_CAPSULE | Freq: Every day | ORAL | 2 refills | Status: DC

## 2017-08-28 NOTE — Patient Instructions (Signed)
In a few days you may receive a survey in the mail or online from [employer] regarding your visit with us today. Please take a moment to fill this out. Your feedback is very important to our whole office. It can help us better understand your needs as well as improve your experience and satisfaction. Thank you for taking your time to complete it. We care about you.  Kristjan Derner, PA-C  

## 2017-08-29 ENCOUNTER — Encounter: Payer: Self-pay | Admitting: Physician Assistant

## 2017-08-29 ENCOUNTER — Telehealth: Payer: Self-pay | Admitting: Physician Assistant

## 2017-08-29 ENCOUNTER — Other Ambulatory Visit: Payer: PRIVATE HEALTH INSURANCE

## 2017-08-29 DIAGNOSIS — Z01419 Encounter for gynecological examination (general) (routine) without abnormal findings: Secondary | ICD-10-CM

## 2017-08-29 LAB — PAP IG W/ RFLX HPV ASCU: PAP Smear Comment: 0

## 2017-08-29 NOTE — Telephone Encounter (Signed)
That is okay, did she give a start date?

## 2017-08-29 NOTE — Progress Notes (Signed)
BP 134/78   Pulse 85   Temp 98.6 F (37 C) (Oral)   Ht 5' 8"  (1.727 m)   Wt 235 lb 12.8 oz (107 kg)   BMI 35.85 kg/m    Subjective:    Patient ID: Brenda Cruz, female    DOB: Sep 26, 1966, 51 y.o.   MRN: [id number]  HPI: Brenda Cruz is a 51 y.o. female presenting on 08/28/2017 for Gynecologic Exam  Patient comes in for an annual exam.  Overall she is doing well.  Is been some years since she had a gynecology exam.  She had tubal torsion bilaterally many years ago.  She was in her early 4s.  With one ovary and both tubes were removed.  Her uterus remained in her right ovary.  She had in vitro performed in order to have her child.  She also had surgery last year in her chest and continues to have nerve pain throughout her chest wall.  She has never been on gabapentin we will start this at this time.  Relevant past medical, surgical, family and social history reviewed and updated as indicated. Allergies and medications reviewed and updated.  Past Medical History:  Diagnosis Date  . Anxiety   . Asthma   . DDD (degenerative disc disease)   . GERD (gastroesophageal reflux disease)   . Hemorrhoids   . HIT (heparin-induced thrombocytopenia) ([place])   . Hyperlipemia   . Hypertension   . Iron deficiency anemia 11/23/2015  . Pulmonary emboli ([place]) 12/07/2015  . TMJ (temporomandibular joint disorder)     Past Surgical History:  Procedure Laterality Date  . CHOLECYSTECTOMY    . LEFT OOPHORECTOMY Left    Related to tubal torsion.  Bilateral salpingectomy also.  Remaining uterus and right ovary  . LUMBAR FUSION      Review of Systems  Constitutional: Negative for activity change, fatigue and fever.  HENT: Negative.   Eyes: Negative.   Respiratory: Negative.  Negative for cough.   Cardiovascular: Negative.  Negative for chest pain.  Gastrointestinal: Negative.  Negative for abdominal pain.  Endocrine: Negative.   Genitourinary: Negative.  Negative for dysuria.    Musculoskeletal: Negative.   Skin: Negative.   Neurological: Positive for weakness and numbness.  Psychiatric/Behavioral: Positive for dysphoric mood and sleep disturbance. Negative for suicidal ideas. The patient is nervous/anxious.     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   Sulfa Antibiotics Itching, Rash   Zithromax [azithromycin] Itching, Rash      Medication List        Accurate as of 08/28/17 11:59 PM. Always use your most recent med list.          acetaminophen 500 MG tablet Commonly known  as:  TYLENOL Take 1,000 mg by mouth every 6 (six) hours as needed (for pain).   albuterol 108 (90 Base) MCG/ACT inhaler Commonly known as:  PROVENTIL HFA;VENTOLIN HFA Inhale 2 puffs into the lungs every 4 (four) hours as needed for wheezing or shortness of breath.   amLODipine 2.5 MG tablet Commonly known as:  NORVASC TAKE ONE (1) TABLET EACH DAY   aspirin EC 81 MG tablet Take 81 mg by mouth every evening.   diphenhydrAMINE 25 MG tablet Commonly known as:  BENADRYL Take 25 mg by mouth every 6 (six) hours as needed for itching or allergies.   esomeprazole 40 MG capsule Commonly known as:  NEXIUM TAKE ONE (1) CAPSULE  EACH DAY   ferrous sulfate 325 (65 FE) MG tablet Take 325 mg by mouth daily as needed (DURING MENSTRUAL CYCLE).   FLUoxetine 40 MG capsule Commonly known as:  PROZAC Take 1 capsule (40 mg total) daily by mouth.   gabapentin 100 MG capsule Commonly known as:  NEURONTIN Take 1-3 capsules (100-300 mg total) at bedtime by mouth.   LORazepam 1 MG tablet Commonly known as:  ATIVAN TAKE 1 TABLET TWICE DAILY AS NEEDED FOR ANXIETY   metoprolol tartrate 50 MG tablet Commonly known as:  LOPRESSOR TAKE ONE TABLET BY MOUTH TWICE DAILY   multivitamin with minerals Tabs tablet Take 1 tablet by mouth at bedtime.   RA PROBIOTIC GUMMIES Chew Chew 1 each by mouth at bedtime.   rosuvastatin 40 MG tablet Commonly known as:  CRESTOR Take 1 tablet (40 mg total) by mouth daily.   traMADol 50 MG tablet Commonly known as:  ULTRAM Take 1 tablet (50 mg total) by mouth every 8 (eight) hours as needed for moderate pain.          Objective:    BP 134/78   Pulse 85   Temp 98.6 F (37 C) (Oral)   Ht 5' 8"  (1.727 m)   Wt 235 lb 12.8 oz (107 kg)   BMI 35.85 kg/m   Allergies  Allergen Reactions  . Atorvastatin Other (See Comments)    Myalgia   . Iohexol Itching, Rash and Other (See Comments)    Desc: White blisters in mouth during ivp in [facility] '93, ok w/ 13 hour prep today//a.[facility], Onset Date: 10/30/1991   . 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 Penicillins Itching and Rash    Has patient had a PCN reaction causing immediate rash, facial/tongue/throat swelling, SOB or lightheadedness with hypotension: Yes Has patient had a PCN reaction causing severe rash involving mucus membranes or skin necrosis: No Has patient had a PCN reaction that required hospitalization: No Has patient had a PCN reaction occurring within the last 10 years: No If all of the above answers are "NO", then may proceed with Cephalosporin use.  . Sulfa Antibiotics Itching  and Rash  . Zithromax [Azithromycin] Itching and Rash    Physical Exam  Results for orders placed or performed in visit on 06/23/17  Microscopic Examination  Result Value Ref Range   WBC, UA None seen 0 - 5 /hpf   RBC, UA None seen 0 - 2 /hpf   Epithelial Cells (non renal) >10 (A) 0 - 10 /hpf   Renal Epithel, UA None seen None seen /hpf   Bacteria, UA None seen None seen/Few  Urinalysis, Complete  Result Value Ref Range   Specific Gravity, UA <1.005 (L) 1.005 - 1.030  pH, UA 6.5 5.0 - 7.5   Color, UA Yellow Yellow   Appearance Ur Clear Clear   Leukocytes, UA Negative Negative   Protein, UA Negative Negative/Trace   Glucose, UA Negative Negative   Ketones, UA Negative Negative   RBC, UA Negative Negative   Bilirubin, UA Negative Negative   Urobilinogen, Ur 0.2 0.2 - 1.0 mg/dL   Nitrite, UA Negative Negative   Microscopic Examination See below:   High sensitivity CRP  Result Value Ref Range   CRP, High Sensitivity 3.86 (H) 0.00 - 3.00 mg/L  Arthritis Panel  Result Value Ref Range   Uric Acid 5.4 2.5 - 7.1 mg/dL   Rhuematoid fact SerPl-aCnc <10.0 0.0 - 13.9 IU/mL   WBC 17.5 (H) 3.4 - 10.8 x10E3/uL   RBC 4.01 3.77 - 5.28 x10E6/uL   Hemoglobin 11.7 11.1 - 15.9 g/dL   Hematocrit 36.3 34.0 - 46.6 %   MCV 91 79 - 97 fL   MCH 29.2 26.6 - 33.0 pg   MCHC 32.2 31.5 - 35.7 g/dL   RDW 13.9 12.3 - 15.4 %   Platelets 489 (H) 150 - 379 x10E3/uL   Neutrophils 59 Not Estab. %   Lymphs 27 Not Estab. %   Monocytes 8 Not Estab. %   Eos 3 Not Estab. %   Basos 0 Not Estab. %   Neutrophils Absolute 10.3 (H) 1.4 - 7.0 x10E3/uL   Lymphocytes Absolute 4.7 (H) 0.7 - 3.1 x10E3/uL   Monocytes Absolute 1.4 (H) 0.1 - 0.9 x10E3/uL   EOS (ABSOLUTE) 0.5 (H) 0.0 - 0.4 x10E3/uL   Basophils Absolute 0.0 0.0 - 0.2 x10E3/uL   Immature Granulocytes 3 Not Estab. %   Immature Grans (Abs) 0.5 (H) 0.0 - 0.1 x10E3/uL   Sed Rate 30 0 - 32 mm/hr  ANA Comprehensive Panel  Result Value Ref Range   dsDNA Ab  1 0 - 9 IU/mL   ENA RNP Ab <0.2 0.0 - 0.9 AI   ENA SM Ab Ser-aCnc <0.2 0.0 - 0.9 AI   Scleroderma SCL-70 <0.2 0.0 - 0.9 AI   ENA SSA (RO) Ab <0.2 0.0 - 0.9 AI   ENA SSB (LA) Ab <0.2 0.0 - 0.9 AI   Chromatin Ab SerPl-aCnc <0.2 0.0 - 0.9 AI   Anti JO-1 <0.2 0.0 - 0.9 AI   Centromere Ab Screen <0.2 0.0 - 0.9 AI   See below: Comment   D-dimer, quantitative (not at [facility])  Result Value Ref Range   D-DIMER 0.97 (H) 0.00 - 0.49 mg/L FEU      Assessment & Plan:   1. Well female exam with routine gynecological exam - Pap IG w/ reflex to HPV when ASC-U - CBC with Differential/Platelet; Future - CMP14+EGFR; Future - Lipid panel; Future - TSH; Future    Current Outpatient Medications:  .  acetaminophen (TYLENOL) 500 MG tablet, Take 1,000 mg by mouth every 6 (six) hours as needed (for pain). , Disp: , Rfl:  .  albuterol (PROVENTIL HFA;VENTOLIN HFA) 108 (90 Base) MCG/ACT inhaler, Inhale 2 puffs into the lungs every 4 (four) hours as needed for wheezing or shortness of breath., Disp: 1 Inhaler, Rfl: 0 .  amLODipine (NORVASC) 2.5 MG tablet, TAKE ONE (1) TABLET EACH DAY, Disp: 90 tablet, Rfl: 1 .  aspirin EC 81 MG tablet, Take 81 mg by mouth every evening. , Disp: , Rfl:  .  diphenhydrAMINE (BENADRYL) 25 MG tablet, Take 25 mg by mouth every 6 (six) hours as needed for  itching or allergies., Disp: , Rfl:  .  esomeprazole (NEXIUM) 40 MG capsule, TAKE ONE (1) CAPSULE EACH DAY, Disp: 90 capsule, Rfl: 1 .  ferrous sulfate 325 (65 FE) MG tablet, Take 325 mg by mouth daily as needed (DURING MENSTRUAL CYCLE). , Disp: , Rfl:  .  LORazepam (ATIVAN) 1 MG tablet, TAKE 1 TABLET TWICE DAILY AS NEEDED FOR ANXIETY, Disp: 60 tablet, Rfl: 1 .  metoprolol tartrate (LOPRESSOR) 50 MG tablet, TAKE ONE TABLET BY MOUTH TWICE DAILY (Patient taking differently: Take 25 mg by mouth two times a day), Disp: 180 tablet, Rfl: 0 .  Multiple Vitamin (MULTIVITAMIN WITH MINERALS) TABS tablet, Take 1 tablet by mouth at bedtime. ,  Disp: , Rfl:  .  Probiotic Product (RA PROBIOTIC GUMMIES) CHEW, Chew 1 each by mouth at bedtime. , Disp: , Rfl:  .  rosuvastatin (CRESTOR) 40 MG tablet, Take 1 tablet (40 mg total) by mouth daily. (Patient taking differently: Take 40 mg by mouth at bedtime. ), Disp: 30 tablet, Rfl: 11 .  traMADol (ULTRAM) 50 MG tablet, Take 1 tablet (50 mg total) by mouth every 8 (eight) hours as needed for moderate pain., Disp: 60 tablet, Rfl: 5 .  FLUoxetine (PROZAC) 40 MG capsule, Take 1 capsule (40 mg total) daily by mouth., Disp: 30 capsule, Rfl: 2 .  gabapentin (NEURONTIN) 100 MG capsule, Take 1-3 capsules (100-300 mg total) at bedtime by mouth., Disp: 90 capsule, Rfl: 3 Continue all other maintenance medications as listed above.  Follow up plan: Return in about 6 weeks (around 10/09/2017) for recheck.  Educational handout given for [place] PA-C [facility] [address]  [place], [place] [address] [phone]   08/29/2017, 9:14 AM

## 2017-08-29 NOTE — Telephone Encounter (Signed)
Patient aware and note sent to patient

## 2017-08-30 LAB — CMP14+EGFR
A/G RATIO: 1.7 (ref 1.2–2.2)
ALBUMIN: 4.1 g/dL (ref 3.5–5.5)
ALT: 20 IU/L (ref 0–32)
AST: 17 IU/L (ref 0–40)
Alkaline Phosphatase: 71 IU/L (ref 39–117)
BUN/Creatinine Ratio: 12 (ref 9–23)
BUN: 10 mg/dL (ref 6–24)
Bilirubin Total: <0.2 mg/dL (ref 0.0–1.2)
CALCIUM: 8.7 mg/dL (ref 8.7–10.2)
CO2: 24 mmol/L (ref 20–29)
Chloride: 103 mmol/L (ref 96–106)
Creatinine, Ser: 0.84 mg/dL (ref 0.57–1.00)
GFR, EST AFRICAN AMERICAN: 95 mL/min/{1.73_m2} (ref >59)
GFR, EST NON AFRICAN AMERICAN: 82 mL/min/{1.73_m2} (ref >59)
GLOBULIN, TOTAL: 2.4 g/dL (ref 1.5–4.5)
Glucose: 157 mg/dL — ABNORMAL HIGH (ref 65–99)
POTASSIUM: 4.6 mmol/L (ref 3.5–5.2)
SODIUM: 141 mmol/L (ref 134–144)
TOTAL PROTEIN: 6.5 g/dL (ref 6.0–8.5)

## 2017-08-30 LAB — LIPID PANEL
CHOLESTEROL TOTAL: 147 mg/dL (ref 100–199)
Chol/HDL Ratio: 4.2 ratio (ref 0.0–4.4)
HDL: 35 mg/dL — ABNORMAL LOW (ref >39)
LDL Calculated: 78 mg/dL (ref 0–99)
TRIGLYCERIDES: 170 mg/dL — AB (ref 0–149)
VLDL Cholesterol Cal: 34 mg/dL (ref 5–40)

## 2017-08-30 LAB — CBC WITH DIFFERENTIAL/PLATELET
Basophils Absolute: 0.1 10*3/uL (ref 0.0–0.2)
Basos: 1 %
EOS (ABSOLUTE): 0.2 10*3/uL (ref 0.0–0.4)
EOS: 3 %
HEMATOCRIT: 39.2 % (ref 34.0–46.6)
Hemoglobin: 12.7 g/dL (ref 11.1–15.9)
Immature Grans (Abs): 0 10*3/uL (ref 0.0–0.1)
Immature Granulocytes: 0 %
Lymphocytes Absolute: 2.6 10*3/uL (ref 0.7–3.1)
Lymphs: 30 %
MCH: 29.9 pg (ref 26.6–33.0)
MCHC: 32.4 g/dL (ref 31.5–35.7)
MCV: 92 fL (ref 79–97)
MONOS ABS: 0.8 10*3/uL (ref 0.1–0.9)
Monocytes: 9 %
NEUTROS ABS: 4.8 10*3/uL (ref 1.4–7.0)
Neutrophils: 57 %
PLATELETS: 257 10*3/uL (ref 150–379)
RBC: 4.25 x10E6/uL (ref 3.77–5.28)
RDW: 14.5 % (ref 12.3–15.4)
WBC: 8.5 10*3/uL (ref 3.4–10.8)

## 2017-08-30 LAB — TSH: TSH: 1.2 u[IU]/mL (ref 0.450–4.500)

## 2017-09-01 LAB — SPECIMEN STATUS REPORT

## 2017-09-01 LAB — HGB A1C W/O EAG: HEMOGLOBIN A1C: 7.2 % — AB (ref 4.8–5.6)

## 2017-09-01 MED ORDER — METFORMIN HCL 500 MG PO TABS: 500.0000 mg | ORAL_TABLET | Freq: Every day | ORAL | 2 refills | Status: DC

## 2017-09-01 NOTE — Addendum Note (Signed)
Addended byCarrolyn Leigh on: 09/01/2017 02:49 PM   Modules accepted: Orders

## 2017-10-11 ENCOUNTER — Other Ambulatory Visit: Payer: Self-pay | Admitting: Nurse Practitioner

## 2017-10-17 DIAGNOSIS — I219 Acute myocardial infarction, unspecified: Secondary | ICD-10-CM

## 2017-10-17 HISTORY — DX: Acute myocardial infarction, unspecified: I21.9

## 2017-10-20 ENCOUNTER — Ambulatory Visit: Payer: Self-pay | Admitting: Physician Assistant

## 2017-10-31 ENCOUNTER — Encounter: Payer: Self-pay | Admitting: Physician Assistant

## 2017-10-31 ENCOUNTER — Ambulatory Visit: Payer: PRIVATE HEALTH INSURANCE | Admitting: Physician Assistant

## 2017-10-31 ENCOUNTER — Other Ambulatory Visit: Payer: Self-pay | Admitting: Cardiovascular Disease

## 2017-10-31 VITALS — BP 127/78 | HR 80 | Temp 98.3°F | Ht 68.0 in | Wt 233.4 lb

## 2017-10-31 DIAGNOSIS — F3341 Major depressive disorder, recurrent, in partial remission: Secondary | ICD-10-CM

## 2017-10-31 DIAGNOSIS — F411 Generalized anxiety disorder: Secondary | ICD-10-CM | POA: Diagnosis not present

## 2017-10-31 DIAGNOSIS — I10 Essential (primary) hypertension: Secondary | ICD-10-CM | POA: Diagnosis not present

## 2017-10-31 DIAGNOSIS — J0110 Acute frontal sinusitis, unspecified: Secondary | ICD-10-CM

## 2017-10-31 MED ORDER — METFORMIN HCL 500 MG PO TABS: 500.0000 mg | ORAL_TABLET | Freq: Every day | ORAL | 3 refills | Status: DC

## 2017-10-31 MED ORDER — LEVOFLOXACIN 500 MG PO TABS: 500.0000 mg | ORAL_TABLET | Freq: Every day | ORAL | 0 refills | Status: DC

## 2017-10-31 MED ORDER — AMLODIPINE BESYLATE 2.5 MG PO TABS
ORAL_TABLET | ORAL | 3 refills | Status: DC
Start: 2017-10-31 — End: 2018-06-30

## 2017-10-31 MED ORDER — TRAMADOL HCL 50 MG PO TABS: 100.0000 mg | ORAL_TABLET | Freq: Three times a day (TID) | ORAL | 5 refills | Status: DC | PRN

## 2017-10-31 MED ORDER — FLUOXETINE HCL 40 MG PO CAPS: 40.0000 mg | ORAL_CAPSULE | Freq: Every day | ORAL | 3 refills | Status: DC

## 2017-10-31 MED ORDER — LORAZEPAM 1 MG PO TABS: 1.0000 mg | ORAL_TABLET | Freq: Two times a day (BID) | ORAL | 1 refills | Status: DC

## 2017-10-31 MED ORDER — ALBUTEROL SULFATE HFA 108 (90 BASE) MCG/ACT IN AERS: 2.0000 | INHALATION_SPRAY | RESPIRATORY_TRACT | 2 refills | Status: DC | PRN

## 2017-10-31 NOTE — Patient Instructions (Signed)
In a few days you may receive a survey in the mail or online from [employer] regarding your visit with us today. Please take a moment to fill this out. Your feedback is very important to our whole office. It can help us better understand your needs as well as improve your experience and satisfaction. Thank you for taking your time to complete it. We care about you.  Quincie Haroon, PA-C  

## 2017-11-01 NOTE — Telephone Encounter (Signed)
REFILL 

## 2017-11-01 NOTE — Progress Notes (Signed)
BP 127/78   Pulse 80   Temp 98.3 F (36.8 C) (Oral)   Ht 5\' 8"  (1.727 m)   Wt 233 lb 6.4 oz (105.9 kg)   BMI 35.49 kg/m    Subjective:    Patient ID: Brenda Cruz, female    DOB: Apr 08, 1966, 52 y.o.   MRN: [id number]  HPI: Brenda Cruz is a 52 y.o. female presenting on 10/31/2017 for Medication Refill and Sinus Problem This patient has had many days of sinus headache and postnasal drainage. There is copious drainage at times. Denies any fever at this time. There has been a history of sinus infections in the past.  No history of sinus surgery. There is cough at night. It has become more prevalent in recent days.  She is also here for recheck on her depression medication.  She feels fairly stable at the level she is at now.  She is tolerating it well.  We need to do refills.  Blood pressure is been very good.  She has no other complaints at this time.  Relevant past medical, surgical, family and social history reviewed and updated as indicated. Allergies and medications reviewed and updated.  Past Medical History:  Diagnosis Date  . Anxiety   . Asthma   . DDD (degenerative disc disease)   . GERD (gastroesophageal reflux disease)   . Hemorrhoids   . HIT (heparin-induced thrombocytopenia) ([place])   . Hyperlipemia   . Hypertension   . Iron deficiency anemia 11/23/2015  . Pulmonary emboli ([place]) 12/07/2015  . TMJ (temporomandibular joint disorder)     Past Surgical History:  Procedure Laterality Date  . CARDIAC CATHETERIZATION N/A 10/29/2015   Procedure: Left Heart Cath and Coronary Angiography;  Surgeon: [name], MD;  Location: [facility];  Service: Cardiovascular;  Laterality: N/A;  . CHOLECYSTECTOMY    . CORONARY ARTERY BYPASS GRAFT N/A 11/02/2015   Procedure: CORONARY ARTERY BYPASS GRAFTING (CABG)x 1 using left internal mammary artery.;  Surgeon: Melrose Nakayama, MD;  Location: [facility];  Service: Open Heart Surgery;  Laterality: N/A;  . LEFT OOPHORECTOMY  Left    Related to tubal torsion.  Bilateral salpingectomy also.  Remaining uterus and right ovary  . LUMBAR FUSION    . TEE WITHOUT CARDIOVERSION N/A 11/02/2015   Procedure: TRANSESOPHAGEAL ECHOCARDIOGRAM (TEE);  Surgeon: Melrose Nakayama, MD;  Location: [facility];  Service: Open Heart Surgery;  Laterality: N/A;    Review of Systems  Constitutional: Positive for chills and fatigue. Negative for activity change, appetite change and fever.  HENT: Positive for congestion, postnasal drip, sinus pressure, sinus pain and sore throat.   Eyes: Negative.   Respiratory: Positive for cough. Negative for shortness of breath and wheezing.   Cardiovascular: Negative.  Negative for chest pain, palpitations and leg swelling.  Gastrointestinal: Negative.   Genitourinary: Negative.   Musculoskeletal: Negative.   Skin: Negative.   Neurological: Positive for headaches.    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   Sulfa Antibiotics Itching, Rash      Medication List        Accurate as of 10/31/17 11:59 PM. Always use your most recent med list.          acetaminophen 500 MG tablet Commonly known as:  TYLENOL Take 1,000 mg by mouth every 6 (six)  hours as needed (for pain).   albuterol 108 (90 Base) MCG/ACT inhaler Commonly known as:  PROVENTIL HFA;VENTOLIN HFA Inhale 2 puffs into the lungs every 4 (four) hours as needed for wheezing or shortness of breath.   amLODipine 2.5 MG tablet Commonly known as:  NORVASC TAKE ONE (1) TABLET EACH DAY   aspirin EC 81 MG tablet Take 81 mg by mouth every evening.   diphenhydrAMINE 25 MG tablet Commonly known as:  BENADRYL Take 25 mg by mouth every 6 (six) hours as needed for itching or allergies.   esomeprazole 40 MG capsule Commonly known as:  NEXIUM TAKE ONE (1) CAPSULE EACH DAY   ferrous sulfate 325 (65 FE) MG tablet Take 325 mg by mouth daily as needed (DURING MENSTRUAL CYCLE).   FLUoxetine 40 MG capsule Commonly known as:  PROZAC Take 1 capsule (40 mg total) by mouth daily.   gabapentin 100 MG capsule Commonly known as:  NEURONTIN Take 1-3 capsules (100-300 mg total) at bedtime by mouth.   levofloxacin 500 MG tablet Commonly known as:  LEVAQUIN Take 1 tablet (500 mg total) by mouth daily.   LORazepam 1 MG tablet Commonly known as:  ATIVAN Take 1 tablet (1 mg total) by mouth 2 (two) times daily.   metFORMIN 500 MG tablet Commonly known as:  GLUCOPHAGE Take 1 tablet (500 mg total) by mouth daily with breakfast.   metoprolol tartrate 50 MG tablet Commonly known as:  LOPRESSOR TAKE ONE TABLET BY MOUTH TWICE DAILY   multivitamin with minerals Tabs tablet Take 1 tablet by mouth at bedtime.   RA PROBIOTIC GUMMIES Chew Chew 1 each by mouth at bedtime.   rosuvastatin 40 MG tablet Commonly known as:  CRESTOR Take 1 tablet (40 mg total) by mouth daily.   traMADol 50 MG tablet Commonly known as:  ULTRAM Take 2 tablets (100 mg total) by mouth every 8 (eight) hours as needed for moderate pain.          Objective:    BP 127/78   Pulse 80   Temp 98.3 F (36.8 C) (Oral)   Ht 5\' 8"  (1.727 m)   Wt 233 lb 6.4 oz (105.9 kg)   BMI 35.49 kg/m   Allergies  Allergen  Reactions  . Atorvastatin Other (See Comments)    Myalgia   . Iohexol Itching, Rash and Other (See Comments)    Desc: White blisters in mouth during ivp in [facility] '93, ok w/ 13 hour prep today//a.calhoun, Onset Date: 10/30/1991   . 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 Penicillins Itching and Rash    Has patient had a PCN reaction causing immediate rash, facial/tongue/throat swelling, SOB or lightheadedness with hypotension: Yes Has patient had a PCN reaction causing severe rash involving mucus membranes or skin necrosis: No Has patient had a  PCN reaction that required hospitalization: No Has patient had a PCN reaction occurring within the last 10 years: No If all of the above answers are "NO", then may proceed with Cephalosporin use.  . Sulfa Antibiotics Itching and Rash    Physical Exam  Constitutional: She is oriented to person, place, and time. She appears well-developed and well-nourished.  HENT:  Head: Normocephalic and atraumatic.  Right Ear: Tympanic membrane and external ear normal. No middle ear effusion.  Left Ear: Tympanic membrane and external ear normal.  No middle ear effusion.  Nose: Mucosal edema and rhinorrhea present. Right sinus exhibits no maxillary sinus tenderness. Left sinus exhibits no maxillary sinus tenderness.  Mouth/Throat: Uvula is midline. Posterior oropharyngeal erythema present.  Eyes: Conjunctivae and EOM are normal. Pupils are equal, round, and reactive to light. Right eye exhibits no discharge. Left eye exhibits no discharge.  Neck: Normal range of motion.  Cardiovascular: Normal rate, regular rhythm and normal heart sounds.  Pulmonary/Chest: Effort normal and breath sounds normal. No respiratory distress. She has no wheezes.  Abdominal: Soft.  Lymphadenopathy:    She has no cervical adenopathy.  Neurological: She is alert and oriented to person, place, and time.  Skin: Skin is warm and dry.    Psychiatric: She has a normal mood and affect.        Assessment & Plan:   1. Essential hypertension - amLODipine (NORVASC) 2.5 MG tablet; TAKE ONE (1) TABLET EACH DAY  Dispense: 90 tablet; Refill: 3  2. GAD (generalized anxiety disorder) - LORazepam (ATIVAN) 1 MG tablet; Take 1 tablet (1 mg total) by mouth 2 (two) times daily.  Dispense: 60 tablet; Refill: 1 - FLUoxetine (PROZAC) 40 MG capsule; Take 1 capsule (40 mg total) by mouth daily.  Dispense: 90 capsule; Refill: 3  3. Recurrent major depressive disorder, in partial remission (HCC) - FLUoxetine (PROZAC) 40 MG capsule; Take 1 capsule (40 mg total) by mouth daily.  Dispense: 90 capsule; Refill: 3  4. Acute non-recurrent frontal sinusitis - albuterol (PROVENTIL HFA;VENTOLIN HFA) 108 (90 Base) MCG/ACT inhaler; Inhale 2 puffs into the lungs every 4 (four) hours as needed for wheezing or shortness of breath.  Dispense: 1 Inhaler; Refill: 2 - levofloxacin (LEVAQUIN) 500 MG tablet; Take 1 tablet (500 mg total) by mouth daily.  Dispense: 10 tablet; Refill: 0    Current Outpatient Medications:  .  acetaminophen (TYLENOL) 500 MG tablet, Take 1,000 mg by mouth every 6 (six) hours as needed (for pain). , Disp: , Rfl:  .  albuterol (PROVENTIL HFA;VENTOLIN HFA) 108 (90 Base) MCG/ACT inhaler, Inhale 2 puffs into the lungs every 4 (four) hours as needed for wheezing or shortness of breath., Disp: 1 Inhaler, Rfl: 2 .  amLODipine (NORVASC) 2.5 MG tablet, TAKE ONE (1) TABLET EACH DAY, Disp: 90 tablet, Rfl: 3 .  aspirin EC 81 MG tablet, Take 81 mg by mouth every evening. , Disp: , Rfl:  .  diphenhydrAMINE (BENADRYL) 25 MG tablet, Take 25 mg by mouth every 6 (six) hours as needed for itching or allergies., Disp: , Rfl:  .  esomeprazole (NEXIUM) 40 MG capsule, TAKE ONE (1) CAPSULE EACH DAY, Disp: 90 capsule, Rfl: 1 .  ferrous sulfate 325 (65 FE) MG tablet, Take 325 mg by mouth daily as needed (DURING MENSTRUAL CYCLE). , Disp: , Rfl:  .  FLUoxetine  (PROZAC) 40 MG capsule, Take 1 capsule (40 mg total) by mouth daily., Disp: 90 capsule, Rfl: 3 .  gabapentin (NEURONTIN) 100 MG capsule,  Take 1-3 capsules (100-300 mg total) at bedtime by mouth., Disp: 90 capsule, Rfl: 3 .  LORazepam (ATIVAN) 1 MG tablet, Take 1 tablet (1 mg total) by mouth 2 (two) times daily., Disp: 60 tablet, Rfl: 1 .  metFORMIN (GLUCOPHAGE) 500 MG tablet, Take 1 tablet (500 mg total) by mouth daily with breakfast., Disp: 90 tablet, Rfl: 3 .  Multiple Vitamin (MULTIVITAMIN WITH MINERALS) TABS tablet, Take 1 tablet by mouth at bedtime. , Disp: , Rfl:  .  Probiotic Product (RA PROBIOTIC GUMMIES) CHEW, Chew 1 each by mouth at bedtime. , Disp: , Rfl:  .  rosuvastatin (CRESTOR) 40 MG tablet, Take 1 tablet (40 mg total) by mouth daily. (Patient taking differently: Take 40 mg by mouth at bedtime. ), Disp: 30 tablet, Rfl: 11 .  traMADol (ULTRAM) 50 MG tablet, Take 2 tablets (100 mg total) by mouth every 8 (eight) hours as needed for moderate pain., Disp: 120 tablet, Rfl: 5 .  levofloxacin (LEVAQUIN) 500 MG tablet, Take 1 tablet (500 mg total) by mouth daily., Disp: 10 tablet, Rfl: 0 .  metoprolol tartrate (LOPRESSOR) 50 MG tablet, TAKE ONE TABLET BY MOUTH TWICE DAILY, Disp: 180 tablet, Rfl: 0 Continue all other maintenance medications as listed above.  Follow up plan: Return in about 2 months (around 12/29/2017) for recheck.  Educational handout given for [place] PA-C [place] [address]  [place], [place] [address] [phone]   11/01/2017, 12:35 PM

## 2017-11-29 ENCOUNTER — Telehealth: Payer: Self-pay | Admitting: *Deleted

## 2017-11-29 NOTE — Telephone Encounter (Signed)
   [facility] Pre-operative Risk Assessment    Request for surgical clearance:  1. What type of surgery is being performed? LEFT BREAST BIOPSY   2. When is this surgery scheduled? 12/05/17   3. What type of clearance is required (medical clearance vs. Pharmacy clearance to hold med vs. Both)? MEDICATION   4. Are there any medications that need to be held prior to surgery and how long?ASPIRIN - HOW LONG  5. Practice name and name of physician performing surgery? [facility]    6. What is your office phone and fax number? PHONE [phone] FAX [phone]   7. Anesthesia type (None, local, MAC, general) ?   PLEASE NOTIFY PATIENT OF RECOMMENDATIONS ASAP

## 2017-11-30 NOTE — Telephone Encounter (Signed)
   Primary Cardiologist:James Hochrein, MD  Chart reviewed as part of pre-operative protocol coverage. She is having palpitations, long standing hx.  She needs to hold her ASA 81mg  48hours prior. Going to use Local Anesthesia.   Ok to hold ASA and will see her in clinic Monday.  She is due to see in clinic per last office visit note.   I will route this recommendation to the requesting party via Epic fax function and remove from pre-op pool.  Please call with questions.  [place], [place] 11/30/2017, 1:45 PM     Leanor Kail, PA  11/30/2017, 1:36 PM

## 2017-12-04 ENCOUNTER — Encounter: Payer: Self-pay | Admitting: Physician Assistant

## 2017-12-04 ENCOUNTER — Ambulatory Visit: Payer: PRIVATE HEALTH INSURANCE | Admitting: Physician Assistant

## 2017-12-04 ENCOUNTER — Telehealth: Payer: Self-pay | Admitting: *Deleted

## 2017-12-04 VITALS — BP 138/86 | HR 65 | Ht 68.0 in | Wt 231.1 lb

## 2017-12-04 DIAGNOSIS — R002 Palpitations: Secondary | ICD-10-CM

## 2017-12-04 DIAGNOSIS — R0609 Other forms of dyspnea: Secondary | ICD-10-CM | POA: Diagnosis not present

## 2017-12-04 DIAGNOSIS — I2510 Atherosclerotic heart disease of native coronary artery without angina pectoris: Secondary | ICD-10-CM | POA: Diagnosis not present

## 2017-12-04 DIAGNOSIS — I10 Essential (primary) hypertension: Secondary | ICD-10-CM | POA: Diagnosis not present

## 2017-12-04 DIAGNOSIS — R0683 Snoring: Secondary | ICD-10-CM

## 2017-12-04 DIAGNOSIS — E669 Obesity, unspecified: Secondary | ICD-10-CM | POA: Diagnosis not present

## 2017-12-04 DIAGNOSIS — I2583 Coronary atherosclerosis due to lipid rich plaque: Secondary | ICD-10-CM

## 2017-12-04 DIAGNOSIS — Z01818 Encounter for other preprocedural examination: Secondary | ICD-10-CM | POA: Diagnosis not present

## 2017-12-04 DIAGNOSIS — F439 Reaction to severe stress, unspecified: Secondary | ICD-10-CM | POA: Diagnosis not present

## 2017-12-04 NOTE — Progress Notes (Signed)
Cardiology Office Note    Date:  12/04/2017   ID:  Brenda Cruz, DOB [birth year], MRN [id number]  PCP:  [name], [name]  Cardiologist: Minus Breeding, MD  Chief Complaint  Patient presents with  . Pre-op Exam    History of Present Illness:  Brenda Cruz is a 52 y.o. female with history of CAD and found to have ostial left main stenosis in 2017 treated with CABG x1 with LIMA to the LAD.  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 chest pain 03/2017 with negative dobutamine echo.  Had chest pain 04/2017 felt not to be cardiac.  CT was negative for PE however there was small pericardial effusion on echo.  Repeat echo showed no pericardial effusion.  Patient last saw Dr. Percival Spanish 05/2017 and continued to have constant chest pain worse with laying flat and taking deep breaths.  He checked a CRP mildly elevated at 3.86 and ESR 42 with plans to treat with steroids for presumptive pleuritis or pericarditis.  D-dimer was elevated at 0.97 and WBC 17.5.  She was sent to the ER.  WBC was felt to be elevated from prednisone she had recently taken.  She was treated with a steroid Dosepak for possible pleurisy and referred to rheumatology for complete workup. Patient never went. She was given Doxycycline for tic bite and everything improved.  Now comes in for surgical clearance for breast biopsy. Started having palpitations 2 months ago. Wakes her at night-heart racing and skipping and can't breath. Eases with sitting up. Occurs at rest.Also complains of dyspnea on exertion similar to when she had CABG but not as severe. BP usually good but sometimes high.Drinks 2 16 oz [employer] daily. Under a lot of stress. Husband lost his job of 21 yrs after a buyout.  43-year-old son with hypertension.  She is going to have to get a higher paying job.  Past Medical History:  Diagnosis Date  . Anxiety   . Asthma   . DDD (degenerative disc disease)   .  GERD (gastroesophageal reflux disease)   . Hemorrhoids   . HIT (heparin-induced thrombocytopenia) ([place])   . Hyperlipemia   . Hypertension   . Iron deficiency anemia 11/23/2015  . Pulmonary emboli ([place]) 12/07/2015  . TMJ (temporomandibular joint disorder)     Past Surgical History:  Procedure Laterality Date  . CARDIAC CATHETERIZATION N/A 10/29/2015   Procedure: Left Heart Cath and Coronary Angiography;  Surgeon: [name], MD;  Location: [facility];  Service: Cardiovascular;  Laterality: N/A;  . CHOLECYSTECTOMY    . CORONARY ARTERY BYPASS GRAFT N/A 11/02/2015   Procedure: CORONARY ARTERY BYPASS GRAFTING (CABG)x 1 using left internal mammary artery.;  Surgeon: Melrose Nakayama, MD;  Location: [facility];  Service: Open Heart Surgery;  Laterality: N/A;  . LEFT OOPHORECTOMY Left    Related to tubal torsion.  Bilateral salpingectomy also.  Remaining uterus and right ovary  . LUMBAR FUSION    . TEE WITHOUT CARDIOVERSION N/A 11/02/2015   Procedure: TRANSESOPHAGEAL ECHOCARDIOGRAM (TEE);  Surgeon: Melrose Nakayama, MD;  Location: [facility];  Service: Open Heart Surgery;  Laterality: N/A;    Current Medications: Current Meds  Medication Sig  . acetaminophen (TYLENOL) 500 MG tablet Take 1,000 mg by mouth every 6 (six) hours as needed (for pain).   Marland Kitchen albuterol (PROVENTIL HFA;VENTOLIN HFA) 108 (90 Base) MCG/ACT inhaler Inhale 2 puffs into the lungs every 4 (  four) hours as needed for wheezing or shortness of breath.  Marland Kitchen amLODipine (NORVASC) 2.5 MG tablet TAKE ONE (1) TABLET EACH DAY  . aspirin EC 81 MG tablet Take 81 mg by mouth every evening.   . diphenhydrAMINE (BENADRYL) 25 MG tablet Take 25 mg by mouth every 6 (six) hours as needed for itching or allergies.  Marland Kitchen esomeprazole (NEXIUM) 40 MG capsule TAKE ONE (1) CAPSULE EACH DAY  . ferrous sulfate 325 (65 FE) MG tablet Take 325 mg by mouth daily as needed (DURING MENSTRUAL CYCLE).   Marland Kitchen FLUoxetine (PROZAC) 40 MG capsule Take 1 capsule (40  mg total) by mouth daily.  Marland Kitchen gabapentin (NEURONTIN) 100 MG capsule Take 1-3 capsules (100-300 mg total) at bedtime by mouth.  Marland Kitchen LORazepam (ATIVAN) 1 MG tablet Take 1 tablet (1 mg total) by mouth 2 (two) times daily.  . metFORMIN (GLUCOPHAGE) 500 MG tablet Take 1 tablet (500 mg total) by mouth daily with breakfast.  . metoprolol tartrate (LOPRESSOR) 50 MG tablet TAKE ONE TABLET BY MOUTH TWICE DAILY  . Multiple Vitamin (MULTIVITAMIN WITH MINERALS) TABS tablet Take 1 tablet by mouth at bedtime.   . Probiotic Product (RA PROBIOTIC GUMMIES) CHEW Chew 1 each by mouth at bedtime.   . rosuvastatin (CRESTOR) 40 MG tablet Take 1 tablet (40 mg total) by mouth daily. (Patient taking differently: Take 40 mg by mouth at bedtime. )  . traMADol (ULTRAM) 50 MG tablet Take 2 tablets (100 mg total) by mouth every 8 (eight) hours as needed for moderate pain.     Allergies:   Atorvastatin; Iohexol; Heparin; Penicillins; and Sulfa antibiotics   Social History   Socioeconomic History  . Marital status: Married    Spouse name: None  . Number of children: 1  . Years of education: None  . Highest education level: None  Social Needs  . Financial resource strain: None  . Food insecurity - worry: None  . Food insecurity - inability: None  . Transportation needs - medical: None  . Transportation needs - non-medical: None  Occupational History  . Occupation: Press photographer  Tobacco Use  . Smoking status: Former Research scientist (life sciences)  . Smokeless tobacco: Never Used  . Tobacco comment: Counseling paper for smoking given to patient in exam room   Substance and Sexual Activity  . Alcohol use: No    Alcohol/week: 0.0 oz  . Drug use: No  . Sexual activity: None  Other Topics Concern  . None  Social History Narrative   2 caffeine drinks daily      Family History:  The patient's family history includes Breast cancer in her mother; Colon polyps in her father; Esophageal cancer in her maternal grandfather; Ovarian cancer in  her unknown relative; Stomach cancer in her paternal grandmother.   ROS:   Please see the history of present illness.    Review of Systems  Constitution: Negative.  HENT: Negative.   Eyes: Negative.   Cardiovascular: Positive for dyspnea on exertion and palpitations.  Respiratory: Positive for snoring.   Hematologic/Lymphatic: Negative.   Musculoskeletal: Negative.  Negative for joint pain.  Gastrointestinal: Negative.   Genitourinary: Negative.   Neurological: Negative.   Psychiatric/Behavioral: The patient is nervous/anxious.    All other systems reviewed and are negative.   PHYSICAL EXAM:   VS:  BP 138/86   Pulse 65   Ht 5' 8"  (1.727 m)   Wt 231 lb 1.9 oz (104.8 kg)   SpO2 97%   BMI 35.14 kg/m  Physical Exam  GEN: Obese, in no acute distress  Neck: no JVD, carotid bruits, or masses Cardiac:RRR; no murmurs, rubs, or gallops  Respiratory:  clear to auscultation bilaterally, normal work of breathing GI: soft, nontender, nondistended, + BS Ext: without cyanosis, clubbing, or edema, Good distal pulses bilaterally Neuro:  Alert and Oriented x 3, Strength and sensation are intact Psych: euthymic mood, full affect  Wt Readings from Last 3 Encounters:  12/04/17 231 lb 1.9 oz (104.8 kg)  10/31/17 233 lb 6.4 oz (105.9 kg)  08/28/17 235 lb 12.8 oz (107 kg)      Studies/Labs Reviewed:   EKG:  EKG is  ordered today.  The ekg ordered today demonstrates normal sinus rhythm nonspecific ST-T wave changes, no acute change  Recent Labs: 08/29/2017: ALT 20; BUN 10; Creatinine, Ser 0.84; Hemoglobin 12.7; Platelets 257; Potassium 4.6; Sodium 141; TSH 1.200   Lipid Panel    Component Value Date/Time   CHOL 147 08/29/2017 0802   TRIG 170 (H) 08/29/2017 0802   HDL 35 (L) 08/29/2017 0802   CHOLHDL 4.2 08/29/2017 0802   CHOLHDL 8.1 10/30/2015 0300   VLDL 29 10/30/2015 0300   LDLCALC 78 08/29/2017 0802    Additional studies/ records that were reviewed today include:  Dobutamine  echo 6/2018Impressions:   - Negative [place] echo.   No evidence of ischemia .   Normal LV function       ASSESSMENT:    1. Preoperative clearance   2. Coronary artery disease due to lipid rich plaque   3. Palpitations   4. Essential hypertension   5. Snoring   6. Dyspnea on exertion   7. Stress   8. Obesity with body mass index of 30.0-39.9      PLAN:  In order of problems listed above:  Preoperative clearance for breast biopsy being done tomorrow at [facility] health breast clinic.  She is under a lot of stress and having dyspnea on exertion and palpitations.  Despite this is a low risk surgery and her revised cardiac risk index is 0.9%.  She has good met of 8.23.  Normal dobutamine echo 03/2017.  No further cardiac workup prior to breast biopsy. According to the Revised Cardiac Risk Index (RCRI), her Perioperative Risk of Major Cardiac Event is (%): 0.9  Her Functional Capacity in METs is: 8.23 according to the [facility] Activity Status Index (DASI).  CAD status post CABG x1 with a LIMA to the LAD 2017  Palpitations which are new for her.  She is drinking a lot of [place] and under a lot of stress with her husband losing his job.  Recommend decreasing the caffeine and if this does not help call us back.  Essential hypertension blood pressure has been stable for the most part.  Snoring, palpitations and shortness of breath.  Most likely sleep apnea.  Sleep study was ordered in the past and she never had it done.  Recommend repeating sleep study.  She is afraid of the cost at this point but will return for one when she can afford it.  Dyspnea on exertion could be weight related.  Patient says that worsens will call us.  Normal dobutamine echo last June.  Stress patient has an enormous amount of stress remain calm.  Obesity weight loss program such as weight watchers recommended.  Medication Adjustments/Labs and Tests Ordered: Current medicines are reviewed at length with  the patient today.  Concerns regarding medicines are outlined above.  Medication changes, Labs and Tests  ordered today are listed in the Patient Instructions below. There are no Patient Instructions on file for this visit.   Sumner Boast, PA-C  12/04/2017 12:12 PM    [facility] [place], [place], [place]  [address] Phone: [phone]; Fax: [phone]

## 2017-12-04 NOTE — Patient Instructions (Addendum)
Medication Instructions:  Your physician recommends that you continue on your current medications as directed. Please refer to the Current Medication list given to you today.   Labwork: -None-  Testing/Procedures  -None   Follow-Up: Your physician recommends that you schedule a follow-up appointment in 3 months with Dr. Percival Spanish.    Any Other Special Instructions Will Be Listed Below (If Applicable).  1. Decrease caffeine intake. 2. Weight Loss program (weight watchers) is encouraged.  3. Patient will call when patient is ready to get sleep study.  Order in system.  4. Will Fax to [facility] @ [phone] surgical clearance.  Phone # is [phone].  If you need a refill on your cardiac medications before your next appointment, please call your pharmacy.

## 2017-12-04 NOTE — Telephone Encounter (Signed)
Faxing to [facility] @ [phone] # is [phone] pt's ov for surgical clearance.

## 2017-12-12 ENCOUNTER — Other Ambulatory Visit: Payer: Self-pay | Admitting: Cardiology

## 2017-12-13 NOTE — Telephone Encounter (Signed)
REFILL 

## 2017-12-15 DIAGNOSIS — E119 Type 2 diabetes mellitus without complications: Secondary | ICD-10-CM | POA: Insufficient documentation

## 2017-12-15 DIAGNOSIS — Z853 Personal history of malignant neoplasm of breast: Secondary | ICD-10-CM | POA: Insufficient documentation

## 2017-12-15 DIAGNOSIS — C50912 Malignant neoplasm of unspecified site of left female breast: Secondary | ICD-10-CM

## 2017-12-15 DIAGNOSIS — Z803 Family history of malignant neoplasm of breast: Secondary | ICD-10-CM | POA: Insufficient documentation

## 2017-12-15 HISTORY — DX: Malignant neoplasm of unspecified site of left female breast: C50.912

## 2017-12-15 HISTORY — DX: Type 2 diabetes mellitus without complications: E11.9

## 2017-12-27 DIAGNOSIS — F419 Anxiety disorder, unspecified: Secondary | ICD-10-CM | POA: Insufficient documentation

## 2018-01-03 ENCOUNTER — Telehealth: Payer: Self-pay

## 2018-01-03 NOTE — Telephone Encounter (Signed)
Patient called the office stating that she is currently taking chemo and has been in the hospital for the last week for a virus. She currently has had diarrhea since she was discharged for several days and feels like she needs fluids. Patient states that she called her oncologist and they requested that she contact her PCP to see if we could see her for fluids and a CBC. Patient advised that she needs fluids and requesting that home health be set up for her. She absolutely does not want to have to go back to the hospital. Advised patient that we would not be able to receive her lab results until the next day and also she sounds as if she needs more care than we are able to provide here at the office. I called and spoke with the oncology triage nurse and the patient and advised them of the situation and that we can't set up home health because we have not recently seen her for these current problems. Triage nurse with oncology stated that she will see what the Dr suggest and contact patient with a plan. Advised patient that if they suggest she go to the hospital that she should go. Patient agreed and verbalized understanding.

## 2018-01-05 DIAGNOSIS — B3749 Other urogenital candidiasis: Secondary | ICD-10-CM | POA: Insufficient documentation

## 2018-01-23 DIAGNOSIS — E876 Hypokalemia: Secondary | ICD-10-CM | POA: Insufficient documentation

## 2018-02-07 ENCOUNTER — Other Ambulatory Visit: Payer: Self-pay | Admitting: Physician Assistant

## 2018-02-07 DIAGNOSIS — F411 Generalized anxiety disorder: Secondary | ICD-10-CM

## 2018-02-07 DIAGNOSIS — B372 Candidiasis of skin and nail: Secondary | ICD-10-CM | POA: Insufficient documentation

## 2018-02-08 ENCOUNTER — Other Ambulatory Visit: Payer: Self-pay | Admitting: Physician Assistant

## 2018-02-08 NOTE — Telephone Encounter (Signed)
Last seen 08/21/18  Angel 

## 2018-03-01 DIAGNOSIS — G893 Neoplasm related pain (acute) (chronic): Secondary | ICD-10-CM | POA: Insufficient documentation

## 2018-04-21 ENCOUNTER — Other Ambulatory Visit: Payer: Self-pay | Admitting: Physician Assistant

## 2018-04-21 DIAGNOSIS — F411 Generalized anxiety disorder: Secondary | ICD-10-CM

## 2018-05-12 ENCOUNTER — Other Ambulatory Visit: Payer: Self-pay | Admitting: Nurse Practitioner

## 2018-05-14 NOTE — Telephone Encounter (Signed)
Call dropped

## 2018-05-17 ENCOUNTER — Ambulatory Visit (INDEPENDENT_AMBULATORY_CARE_PROVIDER_SITE_OTHER): Payer: PRIVATE HEALTH INSURANCE

## 2018-05-17 ENCOUNTER — Encounter: Payer: Self-pay | Admitting: Family

## 2018-05-17 ENCOUNTER — Other Ambulatory Visit: Payer: Self-pay | Admitting: Physician Assistant

## 2018-05-17 ENCOUNTER — Ambulatory Visit: Payer: PRIVATE HEALTH INSURANCE | Admitting: Family

## 2018-05-17 VITALS — BP 121/77 | HR 79 | Temp 98.5°F | Ht 68.0 in | Wt 225.0 lb

## 2018-05-17 DIAGNOSIS — R509 Fever, unspecified: Secondary | ICD-10-CM | POA: Diagnosis not present

## 2018-05-17 DIAGNOSIS — J189 Pneumonia, unspecified organism: Secondary | ICD-10-CM

## 2018-05-17 DIAGNOSIS — C50912 Malignant neoplasm of unspecified site of left female breast: Secondary | ICD-10-CM | POA: Diagnosis not present

## 2018-05-17 DIAGNOSIS — R059 Cough, unspecified: Secondary | ICD-10-CM

## 2018-05-17 DIAGNOSIS — F411 Generalized anxiety disorder: Secondary | ICD-10-CM

## 2018-05-17 DIAGNOSIS — R05 Cough: Secondary | ICD-10-CM

## 2018-05-17 MED ORDER — LEVOFLOXACIN 500 MG PO TABS: 500.0000 mg | ORAL_TABLET | Freq: Every day | ORAL | 0 refills | Status: DC

## 2018-05-17 MED ORDER — PREDNISONE 10 MG (21) PO TBPK: ORAL_TABLET | ORAL | 0 refills | Status: DC

## 2018-05-17 NOTE — Patient Instructions (Signed)

## 2018-05-17 NOTE — Progress Notes (Signed)
Subjective:    Patient ID: Brenda Cruz, female    DOB: [birth year], 52 y.o.   MRN: [id number]  Chief Complaint  Patient presents with  . cough, congestion,fever   Pt presents to the office today with cough, congestion, and fever. PT has left breast cancer and received chemo two months ago.  Cough  This is a new problem. The current episode started in the past 7 days. The problem has been gradually worsening. The problem occurs every few minutes. The cough is productive of sputum. Associated symptoms include chills, a fever, headaches, myalgias, nasal congestion, rhinorrhea, shortness of breath, sweats and wheezing. Pertinent negatives include no ear congestion, ear pain or sore throat. The symptoms are aggravated by lying down. Risk factors for lung disease include smoking/tobacco exposure. She has tried rest and OTC cough suppressant for the symptoms. The treatment provided mild relief.      Review of Systems  Constitutional: Positive for chills and fever.  HENT: Positive for rhinorrhea. Negative for ear pain and sore throat.   Respiratory: Positive for cough, shortness of breath and wheezing.   Musculoskeletal: Positive for myalgias.  Neurological: Positive for headaches.  All other systems reviewed and are negative.      Objective:   Physical Exam  Constitutional: She is oriented to person, place, and time. She appears well-developed and well-nourished. No distress.  HENT:  Head: Normocephalic and atraumatic.  Right Ear: Tympanic membrane is bulging.  Left Ear: Tympanic membrane is bulging.  Nose: Mucosal edema and rhinorrhea present.  Mouth/Throat: Posterior oropharyngeal erythema present.  Eyes: Pupils are equal, round, and reactive to light.  Neck: Normal range of motion. Neck supple. No thyromegaly present.  Cardiovascular: Normal rate, regular rhythm, normal heart sounds and intact distal pulses.  No murmur heard. Pulmonary/Chest: Effort normal. No respiratory  distress. She has wheezes. She has rales.  Abdominal: Soft. Bowel sounds are normal. She exhibits no distension. There is no tenderness.  Musculoskeletal: Normal range of motion. She exhibits no edema or tenderness.  Neurological: She is alert and oriented to person, place, and time. She has normal reflexes. No cranial nerve deficit.  Skin: Skin is warm and dry.  Psychiatric: She has a normal mood and affect. Her behavior is normal. Judgment and thought content normal.  Vitals reviewed.   BP 121/77   Pulse 79   Temp 98.5 F (36.9 C) (Oral)   Ht 5\' 8"  (1.727 m)   Wt 225 lb (102.1 kg)   BMI 34.21 kg/m      Assessment & Plan:  Lynn was seen today for cough, congestion,fever.  Diagnoses and all orders for this visit:  Cough -     levofloxacin (LEVAQUIN) 500 MG tablet; Take 1 tablet (500 mg total) by mouth daily. -     predniSONE (STERAPRED UNI-PAK 21 TAB) 10 MG (21) TBPK tablet; Use as directed  Fever, unspecified fever cause -     levofloxacin (LEVAQUIN) 500 MG tablet; Take 1 tablet (500 mg total) by mouth daily. -     predniSONE (STERAPRED UNI-PAK 21 TAB) 10 MG (21) TBPK tablet; Use as directed  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)  Community acquired pneumonia, unspecified laterality -     DG Chest 2 View; Future   - Take meds as prescribed - Use a cool mist humidifier  -Use saline nose sprays frequently -Force fluids -For any cough or congestion  Use plain Mucinex- regular strength or max strength is fine -For fever or aces  or pains- take tylenol or ibuprofen. -Throat lozenges if help -RTO if symptoms worsen or do not improve   Evelina Dun, [name]

## 2018-06-24 ENCOUNTER — Other Ambulatory Visit: Payer: Self-pay | Admitting: Physician Assistant

## 2018-06-24 ENCOUNTER — Other Ambulatory Visit: Payer: Self-pay | Admitting: Cardiovascular Disease

## 2018-06-24 DIAGNOSIS — F411 Generalized anxiety disorder: Secondary | ICD-10-CM

## 2018-06-25 NOTE — Telephone Encounter (Signed)
Rx sent to pharmacy   

## 2018-06-29 ENCOUNTER — Telehealth: Payer: Self-pay | Admitting: Nurse Practitioner

## 2018-06-29 DIAGNOSIS — F3341 Major depressive disorder, recurrent, in partial remission: Secondary | ICD-10-CM

## 2018-06-29 DIAGNOSIS — I10 Essential (primary) hypertension: Secondary | ICD-10-CM

## 2018-06-29 DIAGNOSIS — F411 Generalized anxiety disorder: Secondary | ICD-10-CM

## 2018-06-30 MED ORDER — GABAPENTIN 100 MG PO CAPS: 100.0000 mg | ORAL_CAPSULE | Freq: Every day | ORAL | 3 refills | Status: DC

## 2018-06-30 MED ORDER — AMLODIPINE BESYLATE 2.5 MG PO TABS: ORAL_TABLET | ORAL | 3 refills | Status: DC

## 2018-06-30 MED ORDER — METOPROLOL TARTRATE 50 MG PO TABS: 50.0000 mg | ORAL_TABLET | Freq: Two times a day (BID) | ORAL | 0 refills | Status: DC

## 2018-06-30 MED ORDER — METFORMIN HCL 500 MG PO TABS: 500.0000 mg | ORAL_TABLET | Freq: Every day | ORAL | 3 refills | Status: DC

## 2018-06-30 MED ORDER — ROSUVASTATIN CALCIUM 40 MG PO TABS: ORAL_TABLET | ORAL | 1 refills | Status: DC

## 2018-06-30 MED ORDER — FLUOXETINE HCL 40 MG PO CAPS: 40.0000 mg | ORAL_CAPSULE | Freq: Every day | ORAL | 3 refills | Status: DC

## 2018-06-30 MED ORDER — ESOMEPRAZOLE MAGNESIUM 40 MG PO CPDR: DELAYED_RELEASE_CAPSULE | ORAL | 1 refills | Status: DC

## 2018-07-09 ENCOUNTER — Other Ambulatory Visit: Payer: Self-pay | Admitting: *Deleted

## 2018-07-09 ENCOUNTER — Encounter: Payer: Self-pay | Admitting: *Deleted

## 2018-07-09 ENCOUNTER — Encounter: Payer: Self-pay | Admitting: Family Medicine

## 2018-07-09 ENCOUNTER — Ambulatory Visit: Payer: PRIVATE HEALTH INSURANCE | Admitting: Family Medicine

## 2018-07-09 VITALS — BP 114/74 | HR 77 | Temp 97.5°F | Ht 68.0 in | Wt 229.0 lb

## 2018-07-09 DIAGNOSIS — J329 Chronic sinusitis, unspecified: Secondary | ICD-10-CM | POA: Diagnosis not present

## 2018-07-09 DIAGNOSIS — J40 Bronchitis, not specified as acute or chronic: Secondary | ICD-10-CM

## 2018-07-09 DIAGNOSIS — F411 Generalized anxiety disorder: Secondary | ICD-10-CM

## 2018-07-09 MED ORDER — LEVOFLOXACIN 500 MG PO TABS: 500.0000 mg | ORAL_TABLET | Freq: Every day | ORAL | 0 refills | Status: DC

## 2018-07-09 MED ORDER — PSEUDOEPHEDRINE-GUAIFENESIN ER 60-600 MG PO TB12: 1.0000 | ORAL_TABLET | Freq: Two times a day (BID) | ORAL | 0 refills | Status: DC

## 2018-07-09 MED ORDER — LORAZEPAM 1 MG PO TABS: 1.0000 mg | ORAL_TABLET | Freq: Two times a day (BID) | ORAL | 0 refills | Status: DC

## 2018-07-09 NOTE — Progress Notes (Signed)
MMM,  She has appt with you in a few weeks. Needs refill / enough ativan to last until then

## 2018-07-09 NOTE — Progress Notes (Signed)
Chief Complaint  Patient presents with  . Sinusitis    congestion, pressure, teeth pain, fever    HPI  Patient presents today for Patient presents with upper respiratory congestion. Maxillary pressure. Rhinorrhea that is frequently purulent. There is moderate sore throat. Patient reports coughing frequently as well.  Green, yellow sputum noted. There is subjective fever. The patient denies being short of breath. Onset was5 days ago. Gradually worsening. Tried OTCs without improvement.  PMH: Smoking status noted ROS: Per HPI  Objective: BP 114/74 (BP Location: Right Arm)   Pulse 77   Temp (!) 97.5 F (36.4 C) (Oral)   Ht 5\' 8"  (1.727 m)   Wt 229 lb (103.9 kg)   BMI 34.82 kg/m  Gen: NAD, alert, cooperative with exam HEENT: NCAT, Nasal passages swollen, red  CV: RRR, good S1/S2, no murmur Resp: Bronchitic changes with scattered wheezes, non-labored Ext: No edema, warm Neuro: Alert and oriented, No gross deficits  Assessment and plan:  1. Sinobronchitis     Meds ordered this encounter  Medications  . pseudoephedrine-guaifenesin (MUCINEX D) 60-600 MG 12 hr tablet    Sig: Take 1 tablet by mouth every 12 (twelve) hours.    Dispense:  12 tablet    Refill:  0  . levofloxacin (LEVAQUIN) 500 MG tablet    Sig: Take 1 tablet (500 mg total) by mouth daily.    Dispense:  10 tablet    Refill:  0    No orders of the defined types were placed in this encounter.   Follow up as needed.  Claretta Fraise, MD

## 2018-07-20 ENCOUNTER — Encounter: Payer: Self-pay | Admitting: Nurse Practitioner

## 2018-07-20 ENCOUNTER — Ambulatory Visit: Payer: PRIVATE HEALTH INSURANCE | Admitting: Nurse Practitioner

## 2018-07-20 VITALS — BP 114/74 | HR 75 | Temp 97.4°F | Ht 68.0 in | Wt 230.0 lb

## 2018-07-20 DIAGNOSIS — I10 Essential (primary) hypertension: Secondary | ICD-10-CM

## 2018-07-20 DIAGNOSIS — E669 Obesity, unspecified: Secondary | ICD-10-CM

## 2018-07-20 DIAGNOSIS — K219 Gastro-esophageal reflux disease without esophagitis: Secondary | ICD-10-CM

## 2018-07-20 DIAGNOSIS — C50912 Malignant neoplasm of unspecified site of left female breast: Secondary | ICD-10-CM

## 2018-07-20 DIAGNOSIS — I2510 Atherosclerotic heart disease of native coronary artery without angina pectoris: Secondary | ICD-10-CM

## 2018-07-20 DIAGNOSIS — N393 Stress incontinence (female) (male): Secondary | ICD-10-CM

## 2018-07-20 DIAGNOSIS — E1159 Type 2 diabetes mellitus with other circulatory complications: Secondary | ICD-10-CM

## 2018-07-20 DIAGNOSIS — F3341 Major depressive disorder, recurrent, in partial remission: Secondary | ICD-10-CM

## 2018-07-20 DIAGNOSIS — J441 Chronic obstructive pulmonary disease with (acute) exacerbation: Secondary | ICD-10-CM

## 2018-07-20 DIAGNOSIS — D509 Iron deficiency anemia, unspecified: Secondary | ICD-10-CM

## 2018-07-20 DIAGNOSIS — F411 Generalized anxiety disorder: Secondary | ICD-10-CM

## 2018-07-20 DIAGNOSIS — E785 Hyperlipidemia, unspecified: Secondary | ICD-10-CM | POA: Diagnosis not present

## 2018-07-20 DIAGNOSIS — R5383 Other fatigue: Secondary | ICD-10-CM

## 2018-07-20 LAB — BAYER DCA HB A1C WAIVED: HB A1C (BAYER DCA - WAIVED): 7.5 % — ABNORMAL HIGH (ref <7.0)

## 2018-07-20 MED ORDER — LORAZEPAM 1 MG PO TABS: 1.0000 mg | ORAL_TABLET | Freq: Two times a day (BID) | ORAL | 0 refills | Status: DC

## 2018-07-20 MED ORDER — METOPROLOL TARTRATE 50 MG PO TABS: 50.0000 mg | ORAL_TABLET | Freq: Two times a day (BID) | ORAL | 1 refills | Status: DC

## 2018-07-20 MED ORDER — METFORMIN HCL 500 MG PO TABS: 500.0000 mg | ORAL_TABLET | Freq: Two times a day (BID) | ORAL | 1 refills | Status: DC

## 2018-07-20 NOTE — Progress Notes (Signed)
Subjective:    Patient ID: Brenda Cruz, female    DOB: July 09, 1966, 52 y.o.   MRN: [id number]   Chief Complaint: Medical Management of Chronic Issues   HPI:  1. Essential hypertension  No recent c/o chest or headaches. Does not check blood pressure at home. BP Readings from Last 3 Encounters:  07/09/18 114/74  05/17/18 121/77  12/04/17 138/86     2. Dyslipidemia  Takes crestor daily. Does not watch diet and does not do any exercise.  3. Elevated blood sugar  Blood sugar in past was 157. hgba1c was 7.2% and she was started on metformin. She does not check blood sugars at home.  4. Coronary artery disease involving native coronary artery of native heart without angina pectoris she had cabg 1 1/2 ago. She is doing well. Saw cardiology 06/19/18 and had echo cardiogram which was normal. She will need to do echo every 3 months while on herseptin for breast cancer.  5. COPD exacerbation (HCC)  Still smoking. Has no desire to quit.  6. Gastroesophageal reflux disease without esophagitis  takes nexium daily which works well.  7. Fatigue, unspecified type  Mainly due  To breast cancer treatments  8. Hyperlipidemia, unspecified hyperlipidemia type  See above about cholesterol  9. Obesity with body mass index of 30.0-39.9  No recent weight changes  10. Stress incontinence of urine  is some better  11. Recurrent major depressive disorder, in partial remission ([place])  is on prozac daily and is working well. . Depression screen [facility] 2/9 07/20/2018 07/09/2018 05/17/2018  Decreased Interest 0 0 0  Down, Depressed, Hopeless 0 1 0  PHQ - 2 Score 0 1 0  Altered sleeping - - -  Tired, decreased energy - - -  Change in appetite - - -  Feeling bad or failure about yourself  - - -  Trouble concentrating - - -  Moving slowly or fidgety/restless - - -  Suicidal thoughts - - -  PHQ-9 Score - - -  Difficult doing work/chores - - -  Some recent data might be hidden     12. GAD (generalized  anxiety disorder)  She has had lots of medical problems in the last year. Takes ativan bid.  13. Iron deficiency anemia, unspecified iron deficiency anemia type  Takes iron supplement  14. Infiltrating ductal carcinoma of left breast ([place])  Has a year left of herceptin. She is doing well.    Outpatient Encounter Medications as of 07/20/2018  Medication Sig  . albuterol (PROVENTIL HFA;VENTOLIN HFA) 108 (90 Base) MCG/ACT inhaler Inhale 2 puffs into the lungs every 4 (four) hours as needed for wheezing or shortness of breath.  Marland Kitchen amLODipine (NORVASC) 2.5 MG tablet TAKE ONE (1) TABLET EACH DAY  . aspirin EC 81 MG tablet Take 81 mg by mouth every evening.   . diphenhydrAMINE (BENADRYL) 25 MG tablet Take 25 mg by mouth every 6 (six) hours as needed for itching or allergies.  Marland Kitchen esomeprazole (NEXIUM) 40 MG capsule TAKE ONE (1) CAPSULE EACH DAY  . ferrous sulfate 325 (65 FE) MG tablet Take 325 mg by mouth daily as needed (DURING MENSTRUAL CYCLE).   Marland Kitchen FLUoxetine (PROZAC) 40 MG capsule Take 1 capsule (40 mg total) by mouth daily.  Marland Kitchen gabapentin (NEURONTIN) 100 MG capsule Take 1-3 capsules (100-300 mg total) by mouth at bedtime.  Marland Kitchen ipratropium-albuterol (DUONEB) 0.5-2.5 (3) MG/3ML SOLN Take 3 mLs by nebulization every 6 (six) hours as needed for Wheezing.  Marland Kitchen  LORazepam (ATIVAN) 1 MG tablet Take 1 tablet (1 mg total) by mouth 2 (two) times daily.  . magnesium oxide (MAG-OX) 400 MG tablet Take by mouth.  . metFORMIN (GLUCOPHAGE) 500 MG tablet Take 1 tablet (500 mg total) by mouth daily with breakfast.  . metoprolol tartrate (LOPRESSOR) 50 MG tablet Take 1 tablet (50 mg total) by mouth 2 (two) times daily. Please call office to make appointment for further refills.  . Multiple Vitamin (MULTIVITAMIN WITH MINERALS) TABS tablet Take 1 tablet by mouth at bedtime.   . ondansetron (ZOFRAN) 8 MG tablet Take by mouth.  . Probiotic Product (RA PROBIOTIC GUMMIES) CHEW Chew 1 each by mouth at bedtime.   .  pseudoephedrine-guaifenesin (MUCINEX D) 60-600 MG 12 hr tablet Take 1 tablet by mouth every 12 (twelve) hours.  . rosuvastatin (CRESTOR) 40 MG tablet TAKE ONE (1) TABLET EACH DAY  . traMADol (ULTRAM) 50 MG tablet Take 2 tablets (100 mg total) by mouth every 8 (eight) hours as needed for moderate pain.  . Trastuzumab (HERCEPTIN IV) Inject into the vein.       New complaints: None today  Social history: Lives with husband. Son is high functioning autism   Review of Systems  Constitutional: Negative for activity change and appetite change.  HENT: Negative.   Eyes: Negative for pain.  Respiratory: Negative for shortness of breath.   Cardiovascular: Negative for chest pain, palpitations and leg swelling.  Gastrointestinal: Negative for abdominal pain.  Endocrine: Negative for polydipsia.  Genitourinary: Negative.   Skin: Negative for rash.  Neurological: Negative for dizziness, weakness and headaches.  Hematological: Does not bruise/bleed easily.  Psychiatric/Behavioral: Negative.   All other systems reviewed and are negative.      Objective:   Physical Exam  Constitutional: She is oriented to person, place, and time. She appears well-developed and well-nourished. No distress.  HENT:  Head: Normocephalic.  Nose: Nose normal.  Mouth/Throat: Oropharynx is clear and moist.  Eyes: Pupils are equal, round, and reactive to light. EOM are normal.  Neck: Normal range of motion. Neck supple. No JVD present. Carotid bruit is not present.  Cardiovascular: Normal rate, regular rhythm, normal heart sounds and intact distal pulses.  Pulmonary/Chest: Effort normal and breath sounds normal. No respiratory distress. She has no wheezes. She has no rales. She exhibits no tenderness.  Abdominal: Soft. Normal appearance, normal aorta and bowel sounds are normal. She exhibits no distension, no abdominal bruit, no pulsatile midline mass and no mass. There is no splenomegaly or hepatomegaly. There is  no tenderness.  Musculoskeletal: Normal range of motion. She exhibits no edema.  Lymphadenopathy:    She has no cervical adenopathy.  Neurological: She is alert and oriented to person, place, and time. She has normal reflexes.  Skin: Skin is warm and dry.  Scr on chest wall from portacath  Psychiatric: She has a normal mood and affect. Her behavior is normal. Judgment and thought content normal.  Nursing note and vitals reviewed.  BP 114/74   Pulse 75   Temp (!) 97.4 F (36.3 C) (Oral)   Ht 5' 8"  (1.727 m)   Wt 230 lb (104.3 kg)   BMI 34.97 kg/m       Assessment & Plan:  FANTA WIMBERLEY comes in today with chief complaint of Medical Management of Chronic Issues   Diagnosis and orders addressed:  1. Essential hypertension Low sodium diet - CMP14+EGFR - metoprolol tartrate (LOPRESSOR) 50 MG tablet; Take 1 tablet (50 mg total) by  mouth 2 (two) times daily. Please call office to make appointment for further refills.  Dispense: 180 tablet; Refill: 1  2. Dyslipidemia Low fat diet  3. Type 2 diabetes mellitus with other circulatory complication, without long-term current use of insulin (HCC) Increase metformin to 567m BID- has only been taking 1x a day - Bayer DCA Hb A1c Waived - metFORMIN (GLUCOPHAGE) 500 MG tablet; Take 1 tablet (500 mg total) by mouth 2 (two) times daily with a meal.  Dispense: 180 tablet; Refill: 1  4. Coronary artery disease involving native coronary artery of native heart without angina pectoris Continue to get echo every 3 months  5. COPD exacerbation ([place] Encouraged to stop smoking  6. Gastroesophageal reflux disease without esophagitis Avoid spicy foods Do not eat 2 hours prior to bedtime  7. Fatigue, unspecified type Rest as needed  8. Hyperlipidemia, unspecified hyperlipidemia type Low fat diet  9. Obesity with body mass index of 30.0-39.9 Discussed diet and exercise for person with BMI >25 Will recheck weight in 3-6 months  10.  Stress incontinence of urine  11. Recurrent major depressive disorder, in partial remission ([place] Stress management  12. GAD (generalized anxiety disorder) - LORazepam (ATIVAN) 1 MG tablet; Take 1 tablet (1 mg total) by mouth 2 (two) times daily.  Dispense: 60 tablet; Refill: 0  13. Iron deficiency anemia, unspecified iron deficiency anemia type Labs pending  14. Infiltrating ductal carcinoma of left breast ([place] Complete treatments   Labs pending Health Maintenance reviewed Diet and exercise encouraged  Follow up plan: 6 months    [place] [name]

## 2018-07-20 NOTE — Patient Instructions (Signed)
Stress and Stress Management Stress is a normal reaction to life events. It is what you feel when life demands more than you are used to or more than you can handle. Some stress can be useful. For example, the stress reaction can help you catch the last bus of the day, study for a test, or meet a deadline at work. But stress that occurs too often or for too long can cause problems. It can affect your emotional health and interfere with relationships and normal daily activities. Too much stress can weaken your immune system and increase your risk for physical illness. If you already have a medical problem, stress can make it worse. What are the causes? All sorts of life events may cause stress. An event that causes stress for one person may not be stressful for another person. Major life events commonly cause stress. These may be positive or negative. Examples include losing your job, moving into a new home, getting married, having a baby, or losing a loved one. Less obvious life events may also cause stress, especially if they occur day after day or in combination. Examples include working long hours, driving in traffic, caring for children, being in debt, or being in a difficult relationship. What are the signs or symptoms? Stress may cause emotional symptoms including, the following:  Anxiety. This is feeling worried, afraid, on edge, overwhelmed, or out of control.  Anger. This is feeling irritated or impatient.  Depression. This is feeling sad, down, helpless, or guilty.  Difficulty focusing, remembering, or making decisions.  Stress may cause physical symptoms, including the following:  Aches and pains. These may affect your head, neck, back, stomach, or other areas of your body.  Tight muscles or clenched jaw.  Low energy or trouble sleeping.  Stress may cause unhealthy behaviors, including the following:  Eating to feel better (overeating) or skipping meals.  Sleeping too little,  too much, or both.  Working too much or putting off tasks (procrastination).  Smoking, drinking alcohol, or using drugs to feel better.  How is this diagnosed? Stress is diagnosed through an assessment by your health care provider. Your health care provider will ask questions about your symptoms and any stressful life events.Your health care provider will also ask about your medical history and may order blood tests or other tests. Certain medical conditions and medicine can cause physical symptoms similar to stress. Mental illness can cause emotional symptoms and unhealthy behaviors similar to stress. Your health care provider may refer you to a mental health professional for further evaluation. How is this treated? Stress management is the recommended treatment for stress.The goals of stress management are reducing stressful life events and coping with stress in healthy ways. Techniques for reducing stressful life events include the following:  Stress identification. Self-monitor for stress and identify what causes stress for you. These skills may help you to avoid some stressful events.  Time management. Set your priorities, keep a calendar of events, and learn to say "no." These tools can help you avoid making too many commitments.  Techniques for coping with stress include the following:  Rethinking the problem. Try to think realistically about stressful events rather than ignoring them or overreacting. Try to find the positives in a stressful situation rather than focusing on the negatives.  Exercise. Physical exercise can release both physical and emotional tension. The key is to find a form of exercise you enjoy and do it regularly.  Relaxation techniques. These relax the body and  mind. Examples include yoga, meditation, tai chi, biofeedback, deep breathing, progressive muscle relaxation, listening to music, being out in nature, journaling, and other hobbies. Again, the key is to find  one or more that you enjoy and can do regularly.  Healthy lifestyle. Eat a balanced diet, get plenty of sleep, and do not smoke. Avoid using alcohol or drugs to relax.  Strong support network. Spend time with family, friends, or other people you enjoy being around.Express your feelings and talk things over with someone you trust.  Counseling or talktherapy with a mental health professional may be helpful if you are having difficulty managing stress on your own. Medicine is typically not recommended for the treatment of stress.Talk to your health care provider if you think you need medicine for symptoms of stress. Follow these instructions at home:  Keep all follow-up visits as directed by your health care provider.  Take all medicines as directed by your health care provider. Contact a health care provider if:  Your symptoms get worse or you start having new symptoms.  You feel overwhelmed by your problems and can no longer manage them on your own. Get help right away if:  You feel like hurting yourself or someone else. This information is not intended to replace advice given to you by your health care provider. Make sure you discuss any questions you have with your health care provider. Document Released: 03/29/2001 Document Revised: 03/10/2016 Document Reviewed: 05/28/2013 Elsevier Interactive Patient Education  2017 [employer].

## 2018-07-21 LAB — CMP14+EGFR
A/G RATIO: 1.7 (ref 1.2–2.2)
ALBUMIN: 4.1 g/dL (ref 3.5–5.5)
ALT: 28 IU/L (ref 0–32)
AST: 25 IU/L (ref 0–40)
Alkaline Phosphatase: 96 IU/L (ref 39–117)
BUN / CREAT RATIO: 11 (ref 9–23)
BUN: 12 mg/dL (ref 6–24)
Bilirubin Total: <0.2 mg/dL (ref 0.0–1.2)
CALCIUM: 9.4 mg/dL (ref 8.7–10.2)
CO2: 23 mmol/L (ref 20–29)
Chloride: 99 mmol/L (ref 96–106)
Creatinine, Ser: 1.08 mg/dL — ABNORMAL HIGH (ref 0.57–1.00)
GFR calc Af Amer: 70 mL/min/{1.73_m2} (ref >59)
GFR calc non Af Amer: 60 mL/min/{1.73_m2} (ref >59)
GLOBULIN, TOTAL: 2.4 g/dL (ref 1.5–4.5)
Glucose: 189 mg/dL — ABNORMAL HIGH (ref 65–99)
POTASSIUM: 4.3 mmol/L (ref 3.5–5.2)
SODIUM: 139 mmol/L (ref 134–144)
Total Protein: 6.5 g/dL (ref 6.0–8.5)

## 2018-09-02 ENCOUNTER — Other Ambulatory Visit: Payer: Self-pay | Admitting: Nurse Practitioner

## 2018-09-02 DIAGNOSIS — F411 Generalized anxiety disorder: Secondary | ICD-10-CM

## 2018-09-03 NOTE — Telephone Encounter (Signed)
Last seen 08/09/18  MMM

## 2018-09-22 ENCOUNTER — Encounter: Payer: Self-pay | Admitting: Family Medicine

## 2018-09-22 ENCOUNTER — Ambulatory Visit: Payer: PRIVATE HEALTH INSURANCE | Admitting: Family Medicine

## 2018-09-22 VITALS — BP 139/85 | HR 69 | Temp 97.7°F | Resp 22 | Ht 68.0 in | Wt 233.2 lb

## 2018-09-22 DIAGNOSIS — J40 Bronchitis, not specified as acute or chronic: Secondary | ICD-10-CM | POA: Diagnosis not present

## 2018-09-22 DIAGNOSIS — J328 Other chronic sinusitis: Secondary | ICD-10-CM | POA: Diagnosis not present

## 2018-09-22 DIAGNOSIS — J329 Chronic sinusitis, unspecified: Secondary | ICD-10-CM

## 2018-09-22 MED ORDER — MOXIFLOXACIN HCL 400 MG PO TABS: 400.0000 mg | ORAL_TABLET | Freq: Every day | ORAL | 0 refills | Status: DC

## 2018-09-22 MED ORDER — BETAMETHASONE SOD PHOS & ACET 6 (3-3) MG/ML IJ SUSP
12.0000 mg | Freq: Once | INTRAMUSCULAR | Status: AC
  Administered 2018-09-22: 6 mg via INTRAMUSCULAR

## 2018-09-22 NOTE — Progress Notes (Signed)
Chief Complaint  Patient presents with  . Cough  . Chest Congestion  . Fever    HPI  Patient presents today for Patient presents with upper respiratory congestion. Rhinorrhea that is frequently purulent. There is moderate sore throat. Patient reports coughing frequently as well.  Green sputum noted. There  102 degree feer with chills and sweats two days ago. The patient denies being short of breath. Onset was 3 days ago. Currently on Chemotherapy for breast Ca. PMH: Smoking status noted ROS: Per HPI  Objective: BP 139/85   Pulse 69   Temp 97.7 F (36.5 C) (Oral)   Resp (!) 22   Ht 5\' 8"  (1.727 m)   Wt 233 lb 3.2 oz (105.8 kg)   SpO2 98%   BMI 35.46 kg/m  Gen: NAD, alert, cooperative with exam HEENT: NCAT, Nasal passages swollen, red TMS RED CV: RRR, good S1/S2, no murmur Resp: Bronchitis changes with scattered wheezes, non-labored Ext: No edema, warm Neuro: Alert and oriented, No gross deficits  Assessment and plan:  1. Sinobronchitis     Meds ordered this encounter  Medications  . betamethasone acetate-betamethasone sodium phosphate (CELESTONE) injection 12 mg  . moxifloxacin (AVELOX) 400 MG tablet    Sig: Take 1 tablet (400 mg total) by mouth daily. Take all of these, for infection    Dispense:  10 tablet    Refill:  0    No orders of the defined types were placed in this encounter.   Follow up as needed.  Claretta Fraise, MD

## 2018-10-04 ENCOUNTER — Ambulatory Visit: Payer: PRIVATE HEALTH INSURANCE | Admitting: Family

## 2018-10-04 ENCOUNTER — Encounter: Payer: Self-pay | Admitting: Family

## 2018-10-04 VITALS — BP 129/93 | HR 80 | Temp 97.6°F | Ht 68.0 in | Wt 230.0 lb

## 2018-10-04 DIAGNOSIS — J189 Pneumonia, unspecified organism: Secondary | ICD-10-CM | POA: Diagnosis not present

## 2018-10-04 DIAGNOSIS — J441 Chronic obstructive pulmonary disease with (acute) exacerbation: Secondary | ICD-10-CM | POA: Diagnosis not present

## 2018-10-04 MED ORDER — PREDNISONE 20 MG PO TABS: ORAL_TABLET | ORAL | 0 refills | Status: DC

## 2018-10-04 MED ORDER — DOXYCYCLINE HYCLATE 100 MG PO TABS: 100.0000 mg | ORAL_TABLET | Freq: Two times a day (BID) | ORAL | 0 refills | Status: DC

## 2018-10-04 MED ORDER — METHYLPREDNISOLONE ACETATE 80 MG/ML IJ SUSP
80.0000 mg | Freq: Once | INTRAMUSCULAR | Status: AC
  Administered 2018-10-04: 80 mg via INTRAMUSCULAR

## 2018-10-04 NOTE — Patient Instructions (Signed)
Community-Acquired Pneumonia, Adult  Pneumonia is an infection of the lungs. It causes swelling in the airways of the lungs. Mucus and fluid may also build up inside the airways.  One type of pneumonia can happen while a person is in a hospital. A different type can happen when a person is not in a hospital (community-acquired pneumonia).   What are the causes?    This condition is caused by germs (viruses, bacteria, or fungi). Some types of germs can be passed from one person to another. This can happen when you breathe in droplets from the cough or sneeze of an infected person.  What increases the risk?  You are more likely to develop this condition if you:   Have a long-term (chronic) disease, such as:  ? Chronic obstructive pulmonary disease (COPD).  ? Asthma.  ? Cystic fibrosis.  ? Congestive heart failure.  ? Diabetes.  ? Kidney disease.   Have HIV.   Have sickle cell disease.   Have had your spleen removed.   Do not take good care of your teeth and mouth (poor dental hygiene).   Have a medical condition that increases the risk of breathing in droplets from your own mouth and nose.   Have a weakened body defense system (immune system).   Are a smoker.   Travel to areas where the germs that cause this illness are common.   Are around certain animals or the places they live.  What are the signs or symptoms?   A dry cough.   A wet (productive) cough.   Fever.   Sweating.   Chest pain. This often happens when breathing deeply or coughing.   Fast breathing or trouble breathing.   Shortness of breath.   Shaking chills.   Feeling tired (fatigue).   Muscle aches.  How is this treated?  Treatment for this condition depends on many things. Most adults can be treated at home. In some cases, treatment must happen in a hospital. Treatment may include:   Medicines given by mouth or through an IV tube.   Being given extra oxygen.   Respiratory therapy.  In rare cases, treatment for very bad pneumonia  may include:   Using a machine to help you breathe.   Having a procedure to remove fluid from around your lungs.  Follow these instructions at home:  Medicines   Take over-the-counter and prescription medicines only as told by your doctor.  ? Only take cough medicine if you are losing sleep.   If you were prescribed an antibiotic medicine, take it as told by your doctor. Do not stop taking the antibiotic even if you start to feel better.  General instructions     Sleep with your head and neck raised (elevated). You can do this by sleeping in a recliner or by putting a few pillows under your head.   Rest as needed. Get at least 8 hours of sleep each night.   Drink enough water to keep your pee (urine) pale yellow.   Eat a healthy diet that includes plenty of vegetables, fruits, whole grains, low-fat dairy products, and lean protein.   Do not use any products that contain nicotine or tobacco. These include cigarettes, e-cigarettes, and chewing tobacco. If you need help quitting, ask your doctor.   Keep all follow-up visits as told by your doctor. This is important.  How is this prevented?  A shot (vaccine) can help prevent pneumonia. Shots are often suggested for:   People   older than 50 years of age.   People older than 50 years of age who:  ? Are having cancer treatment.  ? Have long-term (chronic) lung disease.  ? Have problems with their body's defense system.  You may also prevent pneumonia if you take these actions:   Get the flu (influenza) shot every year.   Go to the dentist as often as told.   Wash your hands often. If you cannot use soap and water, use hand sanitizer.  Contact a doctor if:   You have a fever.   You lose sleep because your cough medicine does not help.  Get help right away if:   You are short of breath and it gets worse.   You have more chest pain.   Your sickness gets worse. This is very serious if:  ? You are an older adult.  ? Your body's defense system is weak.   You  cough up blood.  Summary   Pneumonia is an infection of the lungs.   Most adults can be treated at home. Some will need treatment in a hospital.   Drink enough water to keep your pee pale yellow.   Get at least 8 hours of sleep each night.  This information is not intended to replace advice given to you by your health care provider. Make sure you discuss any questions you have with your health care provider.  Document Released: 03/21/2008 Document Revised: 05/31/2018 Document Reviewed: 05/31/2018  Elsevier Interactive Patient Education  2019 [employer]vier Inc[employer]

## 2018-10-04 NOTE — Progress Notes (Signed)
Subjective:    Patient ID: Brenda Cruz, female    DOB: Mar 02, 1966, 52 y.o.   MRN: [id number]  Chief Complaint  Patient presents with  . cough and congestion,dark colored sputum, cramps because of     Cough  This is a recurrent problem. The current episode started more than 1 month ago. The problem has been unchanged. The problem occurs every few minutes. The cough is productive of sputum, productive of purulent sputum and productive of brown sputum. Associated symptoms include chills, a fever, myalgias, nasal congestion, postnasal drip, shortness of breath and wheezing. Pertinent negatives include no ear congestion, ear pain, headaches or sore throat. The symptoms are aggravated by lying down. Risk factors for lung disease include smoking/tobacco exposure. She has tried rest and OTC cough suppressant (avelox) for the symptoms. The treatment provided mild relief. Her past medical history is significant for COPD.      Review of Systems  Constitutional: Positive for chills and fever.  HENT: Positive for postnasal drip. Negative for ear pain and sore throat.   Respiratory: Positive for cough, shortness of breath and wheezing.   Musculoskeletal: Positive for myalgias.  Neurological: Negative for headaches.  All other systems reviewed and are negative.      Objective:   Physical Exam Vitals signs reviewed.  Constitutional:      General: She is not in acute distress.    Appearance: She is well-developed. She is ill-appearing.  HENT:     Head: Normocephalic and atraumatic.     Nose:     Right Turbinates: Swollen.     Left Turbinates: Swollen.     Mouth/Throat:     Pharynx: Posterior oropharyngeal erythema present.  Eyes:     Pupils: Pupils are equal, round, and reactive to light.  Neck:     Musculoskeletal: Normal range of motion and neck supple.     Thyroid: No thyromegaly.  Cardiovascular:     Rate and Rhythm: Normal rate and regular rhythm.     Heart sounds: Normal  heart sounds. No murmur.  Pulmonary:     Effort: Pulmonary effort is normal. No respiratory distress.     Breath sounds: Rales present. No wheezing.     Comments: Constant intermittent nonproductive cough Abdominal:     General: Bowel sounds are normal. There is no distension.     Palpations: Abdomen is soft.     Tenderness: There is no abdominal tenderness.  Musculoskeletal: Normal range of motion.        General: No tenderness.  Skin:    General: Skin is warm and dry.  Neurological:     Mental Status: She is alert and oriented to person, place, and time.     Cranial Nerves: No cranial nerve deficit.     Deep Tendon Reflexes: Reflexes are normal and symmetric.  Psychiatric:        Behavior: Behavior normal.        Thought Content: Thought content normal.        Judgment: Judgment normal.       BP (!) 129/93   Pulse 80   Temp 97.6 F (36.4 C) (Oral)   Ht 5\' 8"  (1.727 m)   Wt 230 lb (104.3 kg)   BMI 34.97 kg/m      Assessment & Plan:  Brenda Cruz comes in today with chief complaint of cough and congestion,dark colored sputum, cramps because of    Diagnosis and orders addressed:  1. Community acquired pneumonia, unspecified laterality -  predniSONE (DELTASONE) 20 MG tablet; Take 3 tabs daily for 1 week, then 2 tabs for 1 week, then 1 tab for one week  Dispense: 42 tablet; Refill: 0 - doxycycline (VIBRA-TABS) 100 MG tablet; Take 1 tablet (100 mg total) by mouth 2 (two) times daily.  Dispense: 20 tablet; Refill: 0 - methylPREDNISolone acetate (DEPO-MEDROL) injection 80 mg  2. COPD exacerbation (HCC) - predniSONE (DELTASONE) 20 MG tablet; Take 3 tabs daily for 1 week, then 2 tabs for 1 week, then 1 tab for one week  Dispense: 42 tablet; Refill: 0 - doxycycline (VIBRA-TABS) 100 MG tablet; Take 1 tablet (100 mg total) by mouth 2 (two) times daily.  Dispense: 20 tablet; Refill: 0 - methylPREDNISolone acetate (DEPO-MEDROL) injection 80 mg   - Take meds as prescribed -  Use a cool mist humidifier  -Use saline nose sprays frequently -Force fluids -For fever or aces or pains- take tylenol or ibuprofen. -Throat lozenges if help RTO if symptoms do not improve or worsen   Evelina Dun, [name]

## 2018-11-22 ENCOUNTER — Telehealth: Payer: Self-pay | Admitting: Nurse Practitioner

## 2018-11-22 MED ORDER — OSELTAMIVIR PHOSPHATE 75 MG PO CAPS: 75.0000 mg | ORAL_CAPSULE | Freq: Every day | ORAL | 0 refills | Status: DC

## 2018-11-22 NOTE — Telephone Encounter (Signed)
[facility]   PT is calling, wanting to know if Tamiflu could be sent in to pharmacy, states that son and spouse both diagnosed with flu last night here at the office.

## 2018-11-22 NOTE — Telephone Encounter (Signed)
Patient aware tamiflu has been sent to pharmacy

## 2018-11-22 NOTE — Telephone Encounter (Signed)
tamiflu rx sent to pharmacy

## 2019-01-12 ENCOUNTER — Other Ambulatory Visit: Payer: Self-pay | Admitting: Nurse Practitioner

## 2019-01-12 DIAGNOSIS — F411 Generalized anxiety disorder: Secondary | ICD-10-CM

## 2019-01-15 ENCOUNTER — Telehealth: Payer: Self-pay | Admitting: Nurse Practitioner

## 2019-01-15 DIAGNOSIS — F411 Generalized anxiety disorder: Secondary | ICD-10-CM

## 2019-01-15 MED ORDER — LORAZEPAM 1 MG PO TABS: 1.0000 mg | ORAL_TABLET | Freq: Two times a day (BID) | ORAL | 1 refills | Status: DC

## 2019-01-15 NOTE — Telephone Encounter (Signed)
Ativan refilled.

## 2019-02-27 ENCOUNTER — Other Ambulatory Visit: Payer: Self-pay | Admitting: *Deleted

## 2019-03-04 ENCOUNTER — Other Ambulatory Visit: Payer: Self-pay | Admitting: Nurse Practitioner

## 2019-03-04 DIAGNOSIS — F411 Generalized anxiety disorder: Secondary | ICD-10-CM

## 2019-03-09 LAB — HM DIABETES EYE EXAM

## 2019-05-23 ENCOUNTER — Telehealth: Payer: Self-pay | Admitting: Cardiology

## 2019-05-23 NOTE — Telephone Encounter (Signed)
OK 

## 2019-05-23 NOTE — Telephone Encounter (Signed)
LVM for patient to call and schedule an appointment with Dr. Percival Spanish.

## 2019-05-23 NOTE — Telephone Encounter (Signed)
New message   Patient would like to be released from seeing Dr. Percival Spanish and would now like to see Dr. Gwenlyn Found. Please advise.

## 2019-05-24 NOTE — Telephone Encounter (Signed)
Fine with me

## 2019-05-27 ENCOUNTER — Telehealth: Payer: Self-pay | Admitting: Cardiovascular Disease

## 2019-05-27 NOTE — Telephone Encounter (Signed)
  Patient had EKG at [facility] and it showed new inverted T waves and S waves, abnormal changes. She would like to come in and have an EKG to see if it still shows that. Please let her know.

## 2019-05-27 NOTE — Telephone Encounter (Signed)
The patient stated that she went to [facility] last week for a mammogram. She was having chest pain so they did an EKG while she was there. They told her that:  Sinus bradycardia  Nonspecific ST and T wave abnormality  When compared with ECG of 02-Mar-2018 14:01,  Inverted T waves have replaced nonspecific T wave abnormality in anterior leads  Results are in Care Everywhere. The patient would like to know if she can come in for a EKG here at the office. She has been advised that it would be helpful to have the actual EKG other than the verbal interpretations.   She is no longer having the chest tightness.   She has a follow up on 06/19/2019

## 2019-05-28 NOTE — Telephone Encounter (Signed)
Needs ROV. She hasn't been back to see me in over 3 years and she's had CABG

## 2019-05-30 NOTE — Telephone Encounter (Signed)
Spoke with pt and made aware of the following:  Needs ROV. She hasn't been back to see me in over 3 years and she's had CABG         Pt has appt 9/2 with Fabian Sharp, PA-C. She would like an appt sooner than this if possible. Unable to find appt sooner than this. Pt had appt 10/6 with Dr. Gwenlyn Found that she would like to be moved up. Appt with Dr. Gwenlyn Found rescheduled for 9/15. Advised pt to keep 9/2 appt with [facility], PA-C and nurse will see if pt can be put on a waitlist for a sooner appt. Informed pt that during 9/2 appt, [facility], PA-C can determine if pt should keept 9/15 appt with Dr. Gwenlyn Found or follow up at a later date. Advised pt of MI sx and advised to call 911 those symptoms present. Pt verbalized understanding

## 2019-05-31 ENCOUNTER — Ambulatory Visit (INDEPENDENT_AMBULATORY_CARE_PROVIDER_SITE_OTHER): Payer: Managed Care, Other (non HMO) | Admitting: Family Medicine

## 2019-05-31 ENCOUNTER — Other Ambulatory Visit: Payer: Self-pay

## 2019-05-31 ENCOUNTER — Other Ambulatory Visit: Payer: Self-pay | Admitting: Nurse Practitioner

## 2019-05-31 ENCOUNTER — Encounter: Payer: Self-pay | Admitting: Family Medicine

## 2019-05-31 DIAGNOSIS — Z20822 Contact with and (suspected) exposure to covid-19: Secondary | ICD-10-CM

## 2019-05-31 DIAGNOSIS — R197 Diarrhea, unspecified: Secondary | ICD-10-CM | POA: Diagnosis not present

## 2019-05-31 DIAGNOSIS — R6889 Other general symptoms and signs: Secondary | ICD-10-CM | POA: Diagnosis not present

## 2019-05-31 DIAGNOSIS — E1159 Type 2 diabetes mellitus with other circulatory complications: Secondary | ICD-10-CM

## 2019-05-31 DIAGNOSIS — R509 Fever, unspecified: Secondary | ICD-10-CM

## 2019-05-31 DIAGNOSIS — R591 Generalized enlarged lymph nodes: Secondary | ICD-10-CM

## 2019-05-31 DIAGNOSIS — I10 Essential (primary) hypertension: Secondary | ICD-10-CM

## 2019-05-31 DIAGNOSIS — F411 Generalized anxiety disorder: Secondary | ICD-10-CM

## 2019-05-31 MED ORDER — DOXYCYCLINE HYCLATE 100 MG PO TABS: 100.0000 mg | ORAL_TABLET | Freq: Two times a day (BID) | ORAL | 0 refills | Status: AC

## 2019-05-31 NOTE — Progress Notes (Signed)
Virtual Visit via telephone Note Due to COVID-19 pandemic this visit was conducted virtually. This visit type was conducted due to national recommendations for restrictions regarding the COVID-19 Pandemic (e.g. social distancing, sheltering in place) in an effort to limit this patient's exposure and mitigate transmission in our community. All issues noted in this document were discussed and addressed.  A physical exam was not performed with this format.   I connected with Brenda Cruz on 05/31/19 at 1100 by telephone and verified that I am speaking with the correct person using two identifiers. Brenda Cruz is currently located at home and family is currently with them during visit. The provider, Monia Pouch, [name] is located in their office at time of visit.  I discussed the limitations, risks, security and privacy concerns of performing an evaluation and management service by telephone and the availability of in person appointments. I also discussed with the patient that there may be a patient responsible charge related to this service. The patient expressed understanding and agreed to proceed.  Subjective:  Patient ID: Brenda Cruz, female    DOB: June 21, 1966, 53 y.o.   MRN: [id number]  Chief Complaint:  Lymphadenopathy   HPI: Brenda Cruz is a 53 y.o. female presenting on 05/31/2019 for Lymphadenopathy   Pt reports ongoing low grade fever, headaches, diarrhea, and malaise for several days. States she has several enlarged and painful lymph nodes to the back of her neck and head. States they are firm to touch. No erythema or drainage. She states she is unaware if she has been around anyone sick. She has been treating symptoms symptomatically without resolve.     Relevant past medical, surgical, family, and social history reviewed and updated as indicated.  Allergies and medications reviewed and updated.   Past Medical History:  Diagnosis Date   Anxiety    Asthma    DDD  (degenerative disc disease)    GERD (gastroesophageal reflux disease)    Hemorrhoids    HIT (heparin-induced thrombocytopenia) (HCC)    Hyperlipemia    Hypertension    Infiltrating ductal carcinoma of left breast ([place]) 12/15/2017   Iron deficiency anemia 11/23/2015   Pulmonary emboli ([place]) 12/07/2015   TMJ (temporomandibular joint disorder)     Past Surgical History:  Procedure Laterality Date   CARDIAC CATHETERIZATION N/A 10/29/2015   Procedure: Left Heart Cath and Coronary Angiography;  Surgeon: [name], MD;  Location: [facility];  Service: Cardiovascular;  Laterality: N/A;   CHOLECYSTECTOMY     CORONARY ARTERY BYPASS GRAFT N/A 11/02/2015   Procedure: CORONARY ARTERY BYPASS GRAFTING (CABG)x 1 using left internal mammary artery.;  Surgeon: Melrose Nakayama, MD;  Location: [facility];  Service: Open Heart Surgery;  Laterality: N/A;   LEFT OOPHORECTOMY Left    Related to tubal torsion.  Bilateral salpingectomy also.  Remaining uterus and right ovary   LUMBAR FUSION     TEE WITHOUT CARDIOVERSION N/A 11/02/2015   Procedure: TRANSESOPHAGEAL ECHOCARDIOGRAM (TEE);  Surgeon: Melrose Nakayama, MD;  Location: [facility];  Service: Open Heart Surgery;  Laterality: N/A;    Social History   Socioeconomic History   Marital status: Married    Spouse name: Not on file   Number of children: 1   Years of education: Not on file   Highest education level: Not on file  Occupational History   Occupation: Press photographer  Social Needs   Financial resource strain: Not on file   Food insecurity  Worry: Not on file    Inability: Not on file   Transportation needs    Medical: Not on file    Non-medical: Not on file  Tobacco Use   Smoking status: Former Smoker   Smokeless tobacco: Never Used   Tobacco comment: Counseling paper for smoking given to patient in exam room   Substance and Sexual Activity   Alcohol use: No    Alcohol/week: 0.0 standard drinks    Drug use: No   Sexual activity: Not on file  Lifestyle   Physical activity    Days per week: Not on file    Minutes per session: Not on file   Stress: Not on file  Relationships   Social connections    Talks on phone: Not on file    Gets together: Not on file    Attends religious service: Not on file    Active member of club or organization: Not on file    Attends meetings of clubs or organizations: Not on file    Relationship status: Not on file   Intimate partner violence    Fear of current or ex partner: Not on file    Emotionally abused: Not on file    Physically abused: Not on file    Forced sexual activity: Not on file  Other Topics Concern   Not on file  Social History Narrative   2 caffeine drinks daily     Outpatient Encounter Medications as of 05/31/2019  Medication Sig   albuterol (PROVENTIL HFA;VENTOLIN HFA) 108 (90 Base) MCG/ACT inhaler Inhale 2 puffs into the lungs every 4 (four) hours as needed for wheezing or shortness of breath.   amLODipine (NORVASC) 2.5 MG tablet TAKE ONE (1) TABLET EACH DAY   aspirin EC 81 MG tablet Take 81 mg by mouth every evening.    diphenhydrAMINE (BENADRYL) 25 MG tablet Take 25 mg by mouth every 6 (six) hours as needed for itching or allergies.   doxycycline (VIBRA-TABS) 100 MG tablet Take 1 tablet (100 mg total) by mouth 2 (two) times daily for 7 days. 1 po bid   esomeprazole (NEXIUM) 40 MG capsule TAKE ONE (1) CAPSULE EACH DAY   ferrous sulfate 325 (65 FE) MG tablet Take 325 mg by mouth daily as needed (DURING MENSTRUAL CYCLE).    FLUoxetine (PROZAC) 40 MG capsule Take 1 capsule (40 mg total) by mouth daily.   gabapentin (NEURONTIN) 100 MG capsule Take 1-3 capsules (100-300 mg total) by mouth at bedtime.   ipratropium-albuterol (DUONEB) 0.5-2.5 (3) MG/3ML SOLN Take 3 mLs by nebulization every 6 (six) hours as needed for Wheezing.   LORazepam (ATIVAN) 1 MG tablet Take 1 tablet (1 mg total) by mouth 2 (two) times daily.     magnesium oxide (MAG-OX) 400 MG tablet Take by mouth.   metFORMIN (GLUCOPHAGE) 500 MG tablet Take 1 tablet (500 mg total) by mouth 2 (two) times daily with a meal.   metoprolol tartrate (LOPRESSOR) 50 MG tablet Take 1 tablet (50 mg total) by mouth 2 (two) times daily. Please call office to make appointment for further refills.   Multiple Vitamin (MULTIVITAMIN WITH MINERALS) TABS tablet Take 1 tablet by mouth at bedtime.    ondansetron (ZOFRAN) 8 MG tablet Take by mouth.   oseltamivir (TAMIFLU) 75 MG capsule Take 1 capsule (75 mg total) by mouth daily.   predniSONE (DELTASONE) 20 MG tablet Take 3 tabs daily for 1 week, then 2 tabs for 1 week, then 1 tab for one  week   Probiotic Product (RA PROBIOTIC GUMMIES) CHEW Chew 1 each by mouth at bedtime.    rosuvastatin (CRESTOR) 40 MG tablet TAKE ONE (1) TABLET EACH DAY   Trastuzumab (HERCEPTIN IV) Inject into the vein.   [DISCONTINUED] doxycycline (VIBRA-TABS) 100 MG tablet Take 1 tablet (100 mg total) by mouth 2 (two) times daily.   No facility-administered encounter medications on file as of 05/31/2019.     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    Sulfa Antibiotics Itching and Rash    Review of Systems  Constitutional: Positive for fatigue and fever. Negative for activity change, appetite change, chills, diaphoresis and unexpected weight change.  HENT: Negative.   Eyes: Negative.  Negative for photophobia and visual disturbance.  Respiratory: Negative for cough, chest tightness and shortness of breath.   Cardiovascular: Negative for chest pain, palpitations and leg swelling.  Gastrointestinal: Positive for abdominal pain and diarrhea. Negative for abdominal distention, anal bleeding, blood in stool, constipation, nausea and vomiting.  Endocrine: Negative.   Genitourinary: Negative for dysuria, frequency and urgency.  Musculoskeletal: Negative for arthralgias and myalgias.  Skin: Negative.  Negative for color change and pallor.  Allergic/Immunologic: Negative.   Neurological: Positive for headaches. Negative for dizziness, weakness and light-headedness.  Hematological: Bruises/bleeds easily.  Psychiatric/Behavioral: Negative for confusion, hallucinations, sleep disturbance and suicidal ideas.  All other systems reviewed and are negative.        Observations/Objective: No vital signs or physical exam, this was a telephone or virtual health encounter.  Pt alert and oriented, answers all questions appropriately, and able to speak in full sentences.    Assessment and Plan: Brenda Cruz was seen today for lymphadenopathy.  Diagnoses and all orders for this visit:  Suspected 2019 Novel Coronavirus Infection Diarrhea Fever in adult Pt with diarrhea, headache, malaise and low grade fever. Unknown if exposed. Has been treating symptomatically without resolve of symptoms. Will order testing. Pt aware of self quarantine measures and infection prevention measures. Report any new or worsening symptoms. Continue symptomatic care.  -     Novel Coronavirus, NAA ([facility]); Future  Lymphadenopathy Posterior cervical lymphadenopathy reported.  Reports several nodes enlarged and tender to touch. Allergic to PCN and sulfa drugs. Will empirically treat with below. If symptoms unresolved, will need blood work and [place] for further evaluation. Report any new, worsening, or unresolved symptoms.  -     doxycycline (VIBRA-TABS) 100 MG tablet; Take 1 tablet (100 mg total) by mouth 2 (two) times daily for 7 days. 1 po bid     Follow Up Instructions: Return in about 2 weeks (around 06/14/2019), or if symptoms worsen or fail to improve, for lymphadenopathy .  I discussed the assessment and treatment plan with the patient. The patient was provided an opportunity to ask questions and all were answered. The patient agreed with the plan and demonstrated an understanding of the instructions.   The patient was advised to call back or seek an in-person evaluation if the symptoms worsen or if the condition fails to improve as anticipated.  The above assessment and management plan was discussed with the patient. The patient verbalized understanding of and has agreed to the management plan. Patient is aware to call the clinic if symptoms persist or worsen. Patient is aware when to return to the clinic for a follow-up visit. Patient educated on when it is appropriate to go to the emergency department.    I provided 15 minutes of non-face-to-face time during this encounter. The call started at 1100. The call ended at 1115. The other time was used for coordination of care.    Monia Pouch, [name]-C [facility] [address] [place], [place] [address] [phone] 05/31/19

## 2019-06-02 LAB — NOVEL CORONAVIRUS, NAA: SARS-CoV-2, NAA: NOT DETECTED

## 2019-06-18 NOTE — Progress Notes (Signed)
Cardiology Office Note:    Date:  06/19/2019   ID:  Brenda Cruz, DOB 06-06-1966, MRN [id number]  PCP:  [name], [name]  Cardiologist:  Quay Burow, MD   Referring MD: [name], [name], *   Intermittent chest pressure  History of Present Illness:    Brenda Cruz is a 53 y.o. 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 She has a history of chest pain with negative dobutamine echo 03/2017.  She was seen by Dr. Percival Spanish 05/2017 with pleuritic chest pain vs pleurisy treated for presumptive pleuritis or pericarditis. D-dimer was elevated and she was sent to the ER. She was referred to rheumatology, but never went. Doxy for tick bite improved all symptoms. She was last seen in clinic by Ermalinda Barrios [facility] 12/04/17 for preop clearance for breast biopsy. She reported palpitations, DOE, and ongoing social stressors. Given her activity level and low risk procedure. She was cleared without further workup. For palpitations, she was advised to decrease caffeine intake and stress management. Sleep study was recommended.  She presents today after having an abnormal EKG in response to c/o chest pain at her mammogram.  She states she is having increased shortness of breath with climbing stairs.  She has noticed occasional chest pressure over the last several months.  She notices the chest discomfort lasts for minutes at a time and dissipates with rest.  She has also noticed that her legs have started to get heavy with any physical activity which is also relieved by rest.  Finally, she states she has had occasional episodes of dizziness that come on as she is walking last for a few minutes and then go away with rest.  She is very anxious today and states that she feels like she has posttraumatic stress from previous cardiology visits.  She went on to say that  she has taken Ativan with these episodes to see if they were anxiety related and the medication did not improve her symptoms.  With her recent cancer treatment she was not able to take amlodipine due to hypotension.  She states she has resumed her amlodipine.  She also states that she had stopped taking her rosuvastatin 40 mg and has not taken it for the last several months.  She continues to smoke half a pack of cigarettes per day.  She denies chest pain,  lower extremity edema, fatigue, palpitations, melena, hematuria, hemoptysis, diaphoresis, weakness, syncope, orthopnea, and PND.   Past Medical History:  Diagnosis Date   Anxiety    Asthma    DDD (degenerative disc disease)    GERD (gastroesophageal reflux disease)    Hemorrhoids    HIT (heparin-induced thrombocytopenia) (HCC)    Hyperlipemia    Hypertension    Infiltrating ductal carcinoma of left breast ([place]) 12/15/2017   Iron deficiency anemia 11/23/2015   Pulmonary emboli ([place]) 12/07/2015   TMJ (temporomandibular joint disorder)     Past Surgical History:  Procedure Laterality Date   CARDIAC CATHETERIZATION N/A 10/29/2015   Procedure: Left Heart Cath and Coronary Angiography;  Surgeon: [name], MD;  Location: [facility];  Service: Cardiovascular;  Laterality: N/A;   CHOLECYSTECTOMY     CORONARY ARTERY BYPASS GRAFT N/A 11/02/2015   Procedure: CORONARY ARTERY BYPASS GRAFTING (CABG)x 1 using left internal mammary artery.;  Surgeon: Melrose Nakayama,  MD;  Location: [facility];  Service: Open Heart Surgery;  Laterality: N/A;   LEFT OOPHORECTOMY Left    Related to tubal torsion.  Bilateral salpingectomy also.  Remaining uterus and right ovary   LUMBAR FUSION     TEE WITHOUT CARDIOVERSION N/A 11/02/2015   Procedure: TRANSESOPHAGEAL ECHOCARDIOGRAM (TEE);  Surgeon: Melrose Nakayama, MD;  Location: [facility];  Service: Open Heart Surgery;  Laterality: N/A;    Current Medications: Current Meds  Medication  Sig   albuterol (PROVENTIL HFA;VENTOLIN HFA) 108 (90 Base) MCG/ACT inhaler Inhale 2 puffs into the lungs every 4 (four) hours as needed for wheezing or shortness of breath.   amLODipine (NORVASC) 2.5 MG tablet TAKE ONE (1) TABLET EACH DAY   aspirin EC 81 MG tablet Take 81 mg by mouth every evening.    diphenhydrAMINE (BENADRYL) 25 MG tablet Take 25 mg by mouth every 6 (six) hours as needed for itching or allergies.   esomeprazole (NEXIUM) 40 MG capsule TAKE ONE (1) CAPSULE EACH DAY   ferrous sulfate 325 (65 FE) MG tablet Take 325 mg by mouth daily as needed (DURING MENSTRUAL CYCLE).    FLUoxetine (PROZAC) 40 MG capsule Take 1 capsule (40 mg total) by mouth daily.   gabapentin (NEURONTIN) 100 MG capsule Take 1-3 capsules (100-300 mg total) by mouth at bedtime.   ipratropium-albuterol (DUONEB) 0.5-2.5 (3) MG/3ML SOLN Take 3 mLs by nebulization every 6 (six) hours as needed for Wheezing.   LORazepam (ATIVAN) 1 MG tablet Take 1 tablet (1 mg total) by mouth 2 (two) times daily.   magnesium oxide (MAG-OX) 400 MG tablet Take by mouth.   metFORMIN (GLUCOPHAGE) 500 MG tablet Take 1 tablet (500 mg total) by mouth 2 (two) times daily with a meal. (Needs to be seen before next refill)   metoprolol tartrate (LOPRESSOR) 50 MG tablet Take 1 tablet (50 mg total) by mouth 2 (two) times daily. (Needs to be seen before next refill)   Multiple Vitamin (MULTIVITAMIN WITH MINERALS) TABS tablet Take 1 tablet by mouth at bedtime.    ondansetron (ZOFRAN) 8 MG tablet Take by mouth.   Probiotic Product (RA PROBIOTIC GUMMIES) CHEW Chew 1 each by mouth at bedtime.    rosuvastatin (CRESTOR) 40 MG tablet TAKE ONE (1) TABLET EACH DAY   Trastuzumab (HERCEPTIN IV) Inject into the vein.     Allergies:   Atorvastatin, Iohexol, Ace inhibitors, Heparin, Penicillins, and Sulfa antibiotics   Social History   Socioeconomic History   Marital status: Married    Spouse name: Not on file   Number of children: 1     Years of education: Not on file   Highest education level: Not on file  Occupational History   Occupation: Press photographer  Social Needs   Financial resource strain: Not on file   Food insecurity    Worry: Not on file    Inability: Not on file   Transportation needs    Medical: Not on file    Non-medical: Not on file  Tobacco Use   Smoking status: Former Smoker   Smokeless tobacco: Never Used   Tobacco comment: Counseling paper for smoking given to patient in exam room   Substance and Sexual Activity   Alcohol use: No    Alcohol/week: 0.0 standard drinks   Drug use: No   Sexual activity: Not on file  Lifestyle   Physical activity    Days per week: Not on file    Minutes per session: Not on file  Stress: Not on file  Relationships   Social connections    Talks on phone: Not on file    Gets together: Not on file    Attends religious service: Not on file    Active member of club or organization: Not on file    Attends meetings of clubs or organizations: Not on file    Relationship status: Not on file  Other Topics Concern   Not on file  Social History Narrative   2 caffeine drinks daily      Family History: The patient'sfamily history includes Breast cancer in her mother; Colon polyps in her father; Esophageal cancer in her maternal grandfather; Ovarian cancer in an other family member; Stomach cancer in her paternal grandmother. There is no history of Colon cancer.  ROS:   Please see the history of present illness.     All other systems reviewed and are negative.  EKGs/Labs/Other Studies Reviewed:    The following studies were reviewed today:   EKG:  EKG is  ordered today.  The ekg ordered today demonstrates normal sinus rhythm with nonspecific ST and T wave abnormalities 70 bpm-no acute changes  EKG 12/04/2017 Normal sinus rhythm 65 bpm  EKG 06/17/2017 Normal sinus rhythm 73 bpm  EKG 06/14/2017 Sinus tachycardia 105 bpm  Echocardiogram  05/16/2017 Study Conclusions  - Left ventricle: The cavity size was normal. Wall thickness was   normal. Systolic function was normal. The estimated ejection   fraction was in the range of 55% to 60%. Wall motion was normal;   there were no regional wall motion abnormalities. Left   ventricular diastolic function parameters were normal. - Mitral valve: Calcified annulus.  Exercise tolerance test 02/10/2017  The patient walked for 5:57 of a Bruce protocol GXt. Peak HR of 130 which is 75% predicted maximal HR  There were no ST or T wave changes to suggest ischemia .  No QRS widening at peak exercise  Nondiagnostic GXT due to inability to achieve target HR . There was no evidence of ischemia at peak exercise Recent Labs: 07/20/2018: ALT 28; BUN 12; Creatinine, Ser 1.08; Potassium 4.3; Sodium 139  Recent Lipid Panel    Component Value Date/Time   CHOL 147 08/29/2017 0802   TRIG 170 (H) 08/29/2017 0802   HDL 35 (L) 08/29/2017 0802   CHOLHDL 4.2 08/29/2017 0802   CHOLHDL 8.1 10/30/2015 0300   VLDL 29 10/30/2015 0300   LDLCALC 78 08/29/2017 0802    Physical Exam:    VS:  BP 136/78 (BP Location: Left Arm, Patient Position: Sitting, Cuff Size: Normal)    Pulse 70    Temp (!) 96.3 F (35.7 C)    Resp 16    Wt 229 lb (103.9 kg)    SpO2 98%    BMI 34.82 kg/m     Wt Readings from Last 3 Encounters:  06/19/19 229 lb (103.9 kg)  10/04/18 230 lb (104.3 kg)  09/22/18 233 lb 3.2 oz (105.8 kg)     GEN:  Well nourished, well developed in no acute distress HEENT: Normal NECK: No JVD; No carotid bruits LYMPHATICS: No lymphadenopathy CARDIAC: RRR, no murmurs, rubs, gallops RESPIRATORY:  Clear to auscultation without rales, wheezing or rhonchi  ABDOMEN: Soft, non-tender, non-distended MUSCULOSKELETAL:  No edema; No deformity  SKIN: Warm and dry NEUROLOGIC:  Alert and oriented x 3 PSYCHIATRIC: Anxious affect   ASSESSMENT/Plan    1.Coronary artery disease/status post CABG x1/dyspnea  on exertion- cardiac catheterization 10/29/2015 showed 80% left main,  CABG x1 11/02/2015. ETT 02/10/2017 no evidence of ischemia.  She has noticed increased dyspnea with exertion and more frequent but occasional chest pressure.  She feels that the symptoms are similar to her January 2017 precath and CABG symptoms. Continue aspirin 81 mg tablet daily Continue rosuvastatin 40 mg tablet daily Low-sodium heart healthy diet Increase physical activity as tolerated- Goal 150 minutes per week. Smoking cessation Order Lexiscan Myoview- increased dyspnea on exertion  2.  Essential hypertension-BP today 136/78 Continue amlodipine 2.5 monitor tablet daily Continue metoprolol tartrate 50 mg tablet twice daily Low-sodium heart healthy diet Increase physical activity as tolerated Smoking cessation Order CMP and magnesium  3.  Hyperlipidemia- LDL 78 08/29/2017 Continue rosuvastatin 40 mg tablet daily Low-sodium heart healthy diet Increase physical activity as tolerated Smoking cessation   Medication Adjustments/Labs and Tests Ordered: Current medicines are reviewed at length with the patient today.  Concerns regarding medicines are outlined above.  Orders Placed This Encounter  Procedures   Comprehensive metabolic panel   Magnesium   MYOCARDIAL PERFUSION IMAGING   EKG 12-Lead   Meds ordered this encounter  Medications   nitroGLYCERIN (NITROSTAT) 0.4 MG SL tablet    Sig: Place 1 tablet (0.4 mg total) under the tongue every 5 (five) minutes as needed for chest pain.    Dispense:  90 tablet    Refill:  0    Signed, Deberah Pelton, NP-C 06/19/2019 4:07 PM    [facility]

## 2019-06-19 ENCOUNTER — Ambulatory Visit (INDEPENDENT_AMBULATORY_CARE_PROVIDER_SITE_OTHER): Payer: Managed Care, Other (non HMO) | Admitting: General Practice

## 2019-06-19 ENCOUNTER — Encounter: Payer: Self-pay | Admitting: Physician Assistant

## 2019-06-19 ENCOUNTER — Other Ambulatory Visit: Payer: Self-pay

## 2019-06-19 VITALS — BP 136/78 | HR 70 | Temp 96.3°F | Resp 16 | Ht 68.0 in | Wt 229.0 lb

## 2019-06-19 DIAGNOSIS — E785 Hyperlipidemia, unspecified: Secondary | ICD-10-CM | POA: Diagnosis not present

## 2019-06-19 DIAGNOSIS — I2510 Atherosclerotic heart disease of native coronary artery without angina pectoris: Secondary | ICD-10-CM | POA: Diagnosis not present

## 2019-06-19 DIAGNOSIS — Z951 Presence of aortocoronary bypass graft: Secondary | ICD-10-CM | POA: Diagnosis not present

## 2019-06-19 DIAGNOSIS — R0609 Other forms of dyspnea: Secondary | ICD-10-CM

## 2019-06-19 DIAGNOSIS — I10 Essential (primary) hypertension: Secondary | ICD-10-CM | POA: Diagnosis not present

## 2019-06-19 DIAGNOSIS — I2583 Coronary atherosclerosis due to lipid rich plaque: Secondary | ICD-10-CM

## 2019-06-19 MED ORDER — NITROGLYCERIN 0.4 MG SL SUBL: 0.4000 mg | SUBLINGUAL_TABLET | SUBLINGUAL | 0 refills | Status: DC | PRN

## 2019-06-19 NOTE — Patient Instructions (Signed)
Medication Instructions:  Your physician recommends that you continue on your current medications as directed. Please refer to the Current Medication list given to you today.  If you need a refill on your cardiac medications before your next appointment, please call your pharmacy.   Lab work: Your physician recommends that you return for lab work today: CMET, Magnesium  If you have labs (blood work) drawn today and your tests are completely normal, you will receive your results only by: Marland Kitchen MyChart Message (if you have MyChart) OR . A paper copy in the mail If you have any lab test that is abnormal or we need to change your treatment, we will call you to review the results.  Testing/Procedures: Your physician has requested that you have a lexiscan myoview. For further information please visit [email or web address]. Please follow instruction sheet, as given.  Follow-Up: At [facility], you and your health needs are our priority.  As part of our continuing mission to provide you with exceptional heart care, we have created designated Provider Care Teams.  These Care Teams include your primary Cardiologist (physician) and Advanced Practice Providers (APPs -  Physician Assistants and Nurse Practitioners) who all work together to provide you with the care you need, when you need it. . Please schedule a follow-up appointment to see a PA in the office with Coletta Memos, NP after your stress test.

## 2019-06-20 ENCOUNTER — Telehealth: Payer: Self-pay | Admitting: Nurse Practitioner

## 2019-06-20 LAB — COMPREHENSIVE METABOLIC PANEL
ALT: 83 IU/L — ABNORMAL HIGH (ref 0–32)
AST: 103 IU/L — ABNORMAL HIGH (ref 0–40)
Albumin/Globulin Ratio: 1.5 (ref 1.2–2.2)
Albumin: 4.1 g/dL (ref 3.8–4.8)
Alkaline Phosphatase: 97 IU/L (ref 39–117)
BUN/Creatinine Ratio: 9 (ref 9–23)
BUN: 10 mg/dL (ref 6–24)
Bilirubin Total: 0.2 mg/dL (ref 0.0–1.2)
CO2: 23 mmol/L (ref 20–29)
Calcium: 9.3 mg/dL (ref 8.7–10.2)
Chloride: 100 mmol/L (ref 96–106)
Creatinine, Ser: 1.15 mg/dL — ABNORMAL HIGH (ref 0.57–1.00)
GFR calc Af Amer: 64 mL/min/{1.73_m2} (ref >59)
GFR calc non Af Amer: 56 mL/min/{1.73_m2} — ABNORMAL LOW (ref >59)
Globulin, Total: 2.7 g/dL (ref 1.5–4.5)
Glucose: 147 mg/dL — ABNORMAL HIGH (ref 65–99)
Potassium: 4 mmol/L (ref 3.5–5.2)
Sodium: 137 mmol/L (ref 134–144)
Total Protein: 6.8 g/dL (ref 6.0–8.5)

## 2019-06-20 LAB — MAGNESIUM: Magnesium: 1.6 mg/dL (ref 1.6–2.3)

## 2019-06-20 NOTE — Progress Notes (Signed)
Hello, Ms. Brenda Cruz was at our office yesterday and had a CMP. Her Creatinine is stable. However, her AST and ALT were both elevated. I have ordered a stress test for her to evaluate her CAD. She also stated that she had not taken her statin while she was receiving cancer treatment. Please follow her AST and ALT. Thank you for your help.

## 2019-06-21 ENCOUNTER — Telehealth (HOSPITAL_COMMUNITY): Payer: Self-pay

## 2019-06-21 NOTE — Telephone Encounter (Signed)
Encounter complete. 

## 2019-06-25 ENCOUNTER — Telehealth (HOSPITAL_COMMUNITY): Payer: Self-pay

## 2019-06-25 NOTE — Telephone Encounter (Signed)
Encounter complete. 

## 2019-06-26 ENCOUNTER — Ambulatory Visit (HOSPITAL_COMMUNITY): Payer: Managed Care, Other (non HMO)

## 2019-06-26 ENCOUNTER — Other Ambulatory Visit: Payer: Self-pay | Admitting: Nurse Practitioner

## 2019-06-26 DIAGNOSIS — I10 Essential (primary) hypertension: Secondary | ICD-10-CM

## 2019-06-28 ENCOUNTER — Ambulatory Visit (HOSPITAL_COMMUNITY)
Admission: RE | Admit: 2019-06-28 | Discharge: 2019-06-28 | Disposition: A | Discharge status: Home / Self Care | Payer: Managed Care, Other (non HMO) | Source: Ambulatory Visit | Attending: Cardiology | Admitting: Cardiology

## 2019-06-28 ENCOUNTER — Other Ambulatory Visit: Payer: Self-pay

## 2019-06-28 DIAGNOSIS — Z951 Presence of aortocoronary bypass graft: Secondary | ICD-10-CM | POA: Insufficient documentation

## 2019-06-28 DIAGNOSIS — I2583 Coronary atherosclerosis due to lipid rich plaque: Secondary | ICD-10-CM | POA: Diagnosis present

## 2019-06-28 DIAGNOSIS — I2510 Atherosclerotic heart disease of native coronary artery without angina pectoris: Secondary | ICD-10-CM

## 2019-06-28 DIAGNOSIS — E785 Hyperlipidemia, unspecified: Secondary | ICD-10-CM | POA: Insufficient documentation

## 2019-06-28 DIAGNOSIS — I10 Essential (primary) hypertension: Secondary | ICD-10-CM | POA: Insufficient documentation

## 2019-06-28 LAB — MYOCARDIAL PERFUSION IMAGING
LV dias vol: 93 mL (ref 46–106)
LV sys vol: 34 mL
Peak HR: 87 {beats}/min
Rest HR: 60 {beats}/min
SDS: 1
SRS: 1
SSS: 2
TID: 0.94

## 2019-06-28 MED ORDER — TECHNETIUM TC 99M TETROFOSMIN IV KIT
32.5000 | PACK | Freq: Once | INTRAVENOUS | Status: AC | PRN
  Administered 2019-06-28: 32.5 via INTRAVENOUS
  Filled 2019-06-28: qty 33

## 2019-06-28 MED ORDER — REGADENOSON 0.4 MG/5ML IV SOLN
0.4000 mg | Freq: Once | INTRAVENOUS | Status: AC
  Administered 2019-06-28: 0.4 mg via INTRAVENOUS

## 2019-06-28 MED ORDER — TECHNETIUM TC 99M TETROFOSMIN IV KIT
10.4000 | PACK | Freq: Once | INTRAVENOUS | Status: AC | PRN
  Administered 2019-06-28: 10.4 via INTRAVENOUS
  Filled 2019-06-28: qty 11

## 2019-06-30 ENCOUNTER — Encounter

## 2019-07-01 ENCOUNTER — Telehealth: Payer: Self-pay

## 2019-07-01 NOTE — Telephone Encounter (Signed)
Spoke with pt regarding the following 9/3 mychart message:   "I had blood work on 06/19/2019.  I have elevated ALT and AST.  With a history of both heart and cancer, what should be done now.  I have not started back on the Crestor.  I had planned to take it tonight but I want to see what you say about these labs.  My family is very concerned.  Thank you Brenda Cruz"   Pt states she has had lab concerns addressed and has restarted her Crestor. She states she has an appt with Dr. Gwenlyn Found on 9/15 and will discuss labs with him then. No further questions/concens.

## 2019-07-02 ENCOUNTER — Ambulatory Visit: Payer: Managed Care, Other (non HMO) | Admitting: Cardiovascular Disease

## 2019-07-02 ENCOUNTER — Telehealth: Payer: Self-pay

## 2019-07-02 ENCOUNTER — Encounter: Payer: Self-pay | Admitting: Cardiovascular Disease

## 2019-07-02 ENCOUNTER — Other Ambulatory Visit: Payer: Self-pay

## 2019-07-02 VITALS — BP 142/80 | HR 74 | Temp 97.9°F | Ht 68.0 in | Wt 230.0 lb

## 2019-07-02 DIAGNOSIS — E785 Hyperlipidemia, unspecified: Secondary | ICD-10-CM

## 2019-07-02 DIAGNOSIS — R079 Chest pain, unspecified: Secondary | ICD-10-CM

## 2019-07-02 DIAGNOSIS — I10 Essential (primary) hypertension: Secondary | ICD-10-CM | POA: Diagnosis not present

## 2019-07-02 DIAGNOSIS — I2510 Atherosclerotic heart disease of native coronary artery without angina pectoris: Secondary | ICD-10-CM

## 2019-07-02 DIAGNOSIS — Z86711 Personal history of pulmonary embolism: Secondary | ICD-10-CM

## 2019-07-02 DIAGNOSIS — Z720 Tobacco use: Secondary | ICD-10-CM | POA: Insufficient documentation

## 2019-07-02 DIAGNOSIS — I2583 Coronary atherosclerosis due to lipid rich plaque: Secondary | ICD-10-CM

## 2019-07-02 MED ORDER — ISOSORBIDE MONONITRATE ER 30 MG PO TB24: 30.0000 mg | ORAL_TABLET | Freq: Every day | ORAL | 3 refills | Status: DC

## 2019-07-02 MED ORDER — ISOSORBIDE MONONITRATE ER 30 MG PO TB24: 15.0000 mg | ORAL_TABLET | Freq: Every day | ORAL | 3 refills | Status: DC

## 2019-07-02 NOTE — Assessment & Plan Note (Signed)
History of ongoing tobacco abuse of 1/2 pack/day recalcitrant risk factor modification. 

## 2019-07-02 NOTE — Assessment & Plan Note (Signed)
History of CAD status post cardiac catheterization performed 10/29/2015 revealing 80% distal left main stenosis without significant other disease and normal LV function.  She underwent CABG x1 4 days later by Dr. Roxan Hockey with a LIMA to her LAD.  She has had recurrent chest pain over the last 4 to 6 weeks with a recent Myoview stress test performed 06/28/2019 which was entirely normal.

## 2019-07-02 NOTE — Patient Instructions (Addendum)
Medication Instructions:  Your physician has recommended you make the following change in your medication:  START ISOSORBIDE MONONITRATE (IMDUR) 15 MG BY MOUTH DAILY  If you need a refill on your cardiac medications before your next appointment, please call your pharmacy.   Lab work: NONE If you have labs (blood work) drawn today and your tests are completely normal, you will receive your results only by: Marland Kitchen MyChart Message (if you have MyChart) OR . A paper copy in the mail If you have any lab test that is abnormal or we need to change your treatment, we will call you to review the results.  Testing/Procedures: Your physician has requested that you have an echocardiogram. Echocardiography is a painless test that uses sound waves to create images of your heart. It provides your doctor with information about the size and shape of your heart and how well your heart's chambers and valves are working. This procedure takes approximately one hour. There are no restrictions for this procedure. LOCATION: [facility] [employer]: [place],  [address]  YOU WILL BE CONTACTED BY  SCHEDULER TO SET UP THIS APPOINTMENT.    Follow-Up: At [facility], you and your health needs are our priority.  As part of our continuing mission to provide you with exceptional heart care, we have created designated Provider Care Teams.  These Care Teams include your primary Cardiologist (physician) and Advanced Practice Providers (APPs -  Physician Assistants and Nurse Practitioners) who all work together to provide you with the care you need, when you need it. You will need a follow up appointment in 4 weeks with Dr. Quay Burow.  PLEASE MAKE SURE TO HAVE YOUR ECHOCARDIOGRAM COMPLETED BEFORE THIS APPOINTMENT. You will be contacted by a scheduler to make this appointment.

## 2019-07-02 NOTE — Assessment & Plan Note (Signed)
History of essential hypertension blood pressure measured today 142/80.  She is on amlodipine low-dose which she said has stopped her blood pressure in the past.

## 2019-07-02 NOTE — Assessment & Plan Note (Signed)
History of HIT post bypass with DVT and pulmonary embolism on Xarelto for 12 months

## 2019-07-02 NOTE — Progress Notes (Signed)
07/02/2019 Brenda Cruz   July 21, 1966  [id number]  Primary Physician [name] [name], [name] Primary Cardiologist: [name] MD [name], [place]  HPI:  Brenda Cruz is a 53 y.o.  married Caucasian female with a history of GERD, newly diagnosed HLD/ HTN, anxiety, morbid obesity, continued tobacco abuse ( 1.5 PPD) and no past cardiac history who presented to [facility] ED today (10/29/15) with chest pain/SOB.  I last saw her in the office 03/15/2016.  She has been having substernal chest pain since November 2016 but worsening for the past 3 weeks. The chest pain is worse with minimal exertion and associated with SOB/nausea and relived at rest. Her chest pain is not reminiscent of her typical GERD symptoms. She was seen on 10/16/15 by her PCP for evaluation of chest pain. She was supposed to get a stress test, but this was never scheduled. She was newly started on a statin and lisinopril for elevated BPs in the office. However, she never started any of these medications because she "doesnt like taking pills." Additionally, her BP had normalized at home and she was afraid to take the medication for fear of hypotension. She routinely takes her BP at home and it seems to be quite labile.   She works at [employer] in [place] and has quite a bit of stress at work. This morning she became so SOB and had such severe chest pain that she felt like she might pass out. No syncope. She saw the PA at [facility] who advised that she present to the ED. She denies palpitations, LE edema, orthopnea or PND. She does admit to snoring at night but no witnessed apneic episodes. She does not formally exercise and is very sedentary. At the end of our discussion, her husband and she mentioned that this all started after starting her new position at [employer]. She does not have these symptoms on the weekends or at home. For instance, she walked up an incline in the snow last week and had minimal SOB  with no chest pain. Additionally, she has a long history of anxiety and recently started taking her prozac again as well as her ativan.   Of note, she does have a family hx of CAD. Her father died of a massive heart attack at 63. Her mother has 6 siblings and every one of them had CAD with events in their 44s. Her sister has difficult to control HTN and had a heart cath in her 27s, but no stenting to their knowledge. Because of her risk factors, the crescendo nature of her symptoms I decided to proceed with coronary angiography to define her anatomy and rule out an ischemic etiology. I performed carotid catheterization 10/29/15 revealing 80% distal left main stenosis.On 11/02/15 she underwent coronary artery bypass grafting X1 by Dr. Manuela Schwartz in with a LIMA to LAD. She did well postoperatively however she did come back one week later with a DVT, pulmonary embolus at community acquired pneumonia She has been oral anticoagulations since.  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  Over the last 4 to 6 weeks she is noticed new onset episodic chest pain.  Recent Myoview stress test was entirely normal.  In addition, she did develop breast cancer since I saw her last thanks and she has had surgery, chemotherapy and radiation therapy.   Current Meds  Medication Sig  . albuterol (PROVENTIL HFA;VENTOLIN HFA) 108 (90 Base) MCG/ACT  inhaler Inhale 2 puffs into the lungs every 4 (four) hours as needed for wheezing or shortness of breath.  Marland Kitchen amLODipine (NORVASC) 2.5 MG tablet TAKE ONE (1) TABLET EACH DAY (Needs to be seen before next refill)  . aspirin EC 81 MG tablet Take 81 mg by mouth every evening.   . diphenhydrAMINE (BENADRYL) 25 MG tablet Take 25 mg by mouth every 6 (six) hours as needed for itching or allergies.  Marland Kitchen esomeprazole (NEXIUM) 40 MG capsule TAKE ONE (1) CAPSULE EACH DAY  . FLUoxetine (PROZAC) 40 MG capsule Take 1 capsule (40 mg total) by  mouth daily.  Marland Kitchen gabapentin (NEURONTIN) 100 MG capsule Take 1-3 capsules (100-300 mg total) by mouth at bedtime.  Marland Kitchen ipratropium-albuterol (DUONEB) 0.5-2.5 (3) MG/3ML SOLN Take 3 mLs by nebulization every 6 (six) hours as needed for Wheezing.  Marland Kitchen LORazepam (ATIVAN) 1 MG tablet Take 1 tablet (1 mg total) by mouth 2 (two) times daily.  . magnesium oxide (MAG-OX) 400 MG tablet Take by mouth.  . metFORMIN (GLUCOPHAGE) 500 MG tablet Take 1 tablet (500 mg total) by mouth 2 (two) times daily with a meal. (Needs to be seen before next refill) (Patient taking differently: Take 500 mg by mouth daily. (Needs to be seen before next refill))  . metoprolol tartrate (LOPRESSOR) 50 MG tablet Take 1 tablet (50 mg total) by mouth 2 (two) times daily. (Needs to be seen before next refill)  . Multiple Vitamin (MULTIVITAMIN WITH MINERALS) TABS tablet Take 1 tablet by mouth at bedtime.   . nitroGLYCERIN (NITROSTAT) 0.4 MG SL tablet Place 1 tablet (0.4 mg total) under the tongue every 5 (five) minutes as needed for chest pain.  Marland Kitchen ondansetron (ZOFRAN) 8 MG tablet Take by mouth.  . Probiotic Product (RA PROBIOTIC GUMMIES) CHEW Chew 1 each by mouth at bedtime.   . rosuvastatin (CRESTOR) 40 MG tablet TAKE ONE (1) TABLET EACH DAY  . Trastuzumab (HERCEPTIN IV) Inject into the vein.  . [DISCONTINUED] ferrous sulfate 325 (65 FE) MG tablet Take 325 mg by mouth daily as needed (DURING MENSTRUAL CYCLE).      Allergies  Allergen Reactions  . Atorvastatin Other (See Comments)    Myalgia   . Iohexol Itching, Rash and Other (See Comments)    Desc: White blisters in mouth during ivp in [facility] '93, ok w/ 13 hour prep today//a.calhoun, Onset Date: 10/30/1991   . Ace Inhibitors Cough  . 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 Penicillins Itching and Rash    Has patient had a PCN reaction causing immediate rash, facial/tongue/throat swelling, SOB or lightheadedness with hypotension: Yes Has  patient had a PCN reaction causing severe rash involving mucus membranes or skin necrosis: No Has patient had a PCN reaction that required hospitalization: No Has patient had a PCN reaction occurring within the last 10 years: No If all of the above answers are "NO", then may proceed with Cephalosporin use.  . Sulfa Antibiotics Itching and Rash    Social History   Socioeconomic History  . Marital status: Married    Spouse name: Not on file  . Number of children: 1  . Years of education: Not on file  . Highest education level: Not on file  Occupational History  . Occupation: Press photographer  Social Needs  . Financial resource strain: Not on file  . Food insecurity    Worry: Not on file    Inability: Not on file  .  Transportation needs    Medical: Not on file    Non-medical: Not on file  Tobacco Use  . Smoking status: Former Research scientist (life sciences)  . Smokeless tobacco: Never Used  . Tobacco comment: Counseling paper for smoking given to patient in exam room   Substance and Sexual Activity  . Alcohol use: No    Alcohol/week: 0.0 standard drinks  . Drug use: No  . Sexual activity: Not on file  Lifestyle  . Physical activity    Days per week: Not on file    Minutes per session: Not on file  . Stress: Not on file  Relationships  . Social Herbalist on phone: Not on file    Gets together: Not on file    Attends religious service: Not on file    Active member of club or organization: Not on file    Attends meetings of clubs or organizations: Not on file    Relationship status: Not on file  . Intimate partner violence    Fear of current or ex partner: Not on file    Emotionally abused: Not on file    Physically abused: Not on file    Forced sexual activity: Not on file  Other Topics Concern  . Not on file  Social History Narrative   2 caffeine drinks daily      Review of Systems: General: negative for chills, fever, night sweats or weight changes.  Cardiovascular: negative  for chest pain, dyspnea on exertion, edema, orthopnea, palpitations, paroxysmal nocturnal dyspnea or shortness of breath Dermatological: negative for rash Respiratory: negative for cough or wheezing Urologic: negative for hematuria Abdominal: negative for nausea, vomiting, diarrhea, bright red blood per rectum, melena, or hematemesis Neurologic: negative for visual changes, syncope, or dizziness All other systems reviewed and are otherwise negative except as noted above.    Blood pressure (!) 142/80, pulse 74, temperature 97.9 F (36.6 C), height 5\' 8"  (1.727 m), weight 230 lb (104.3 kg).  General appearance: alert and no distress Neck: no adenopathy, no carotid bruit, no JVD, supple, symmetrical, trachea midline and thyroid not enlarged, symmetric, no tenderness/mass/nodules Lungs: clear to auscultation bilaterally Heart: regular rate and rhythm, S1, S2 normal, no murmur, click, rub or gallop Extremities: extremities normal, atraumatic, no cyanosis or edema Pulses: 2+ and symmetric Skin: Skin color, texture, turgor normal. No rashes or lesions Neurologic: Alert and oriented X 3, normal strength and tone. Normal symmetric reflexes. Normal coordination and gait  EKG not performed today  ASSESSMENT AND PLAN:   Essential hypertension History of essential hypertension blood pressure measured today 142/80.  She is on amlodipine low-dose which she said has stopped her blood pressure in the past.  Coronary artery disease due to lipid rich plaque History of CAD status post cardiac catheterization performed 10/29/2015 revealing 80% distal left main stenosis without significant other disease and normal LV function.  She underwent CABG x1 4 days later by Dr. Roxan Hockey with a LIMA to her LAD.  She has had recurrent chest pain over the last 4 to 6 weeks with a recent Myoview stress test performed 06/28/2019 which was entirely normal.  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  We will recheck a lipid liver profile in 2 months  Tobacco abuse History of ongoing tobacco abuse of 1/2 pack/day recalcitrant risk factor modification.  [name] MD FACP,FACC,FAHA, [facility] 07/02/2019 5:45 PM

## 2019-07-02 NOTE — Assessment & Plan Note (Signed)
History of dyslipidemia recently restarted back on statin therapy.  We will recheck a lipid liver profile in 2 months

## 2019-07-02 NOTE — Telephone Encounter (Signed)
Letter including After Visit Summary and any other necessary documents mailed to the patient's address on file on 9/15.

## 2019-07-05 NOTE — Telephone Encounter (Signed)
Pt had appt with Dr. Gwenlyn Found on 9/15 to discuss ALT and AST

## 2019-07-23 ENCOUNTER — Ambulatory Visit: Payer: PRIVATE HEALTH INSURANCE | Admitting: Cardiovascular Disease

## 2019-08-11 ENCOUNTER — Other Ambulatory Visit: Payer: Self-pay | Admitting: Nurse Practitioner

## 2019-09-18 ENCOUNTER — Other Ambulatory Visit: Payer: Self-pay

## 2019-09-18 ENCOUNTER — Encounter: Payer: Self-pay | Admitting: Family Medicine

## 2019-09-18 ENCOUNTER — Ambulatory Visit (INDEPENDENT_AMBULATORY_CARE_PROVIDER_SITE_OTHER): Payer: Managed Care, Other (non HMO) | Admitting: Family Medicine

## 2019-09-18 DIAGNOSIS — J988 Other specified respiratory disorders: Secondary | ICD-10-CM | POA: Diagnosis not present

## 2019-09-18 DIAGNOSIS — B9689 Other specified bacterial agents as the cause of diseases classified elsewhere: Secondary | ICD-10-CM

## 2019-09-18 MED ORDER — CEFPODOXIME PROXETIL 200 MG PO TABS: 200.0000 mg | ORAL_TABLET | Freq: Two times a day (BID) | ORAL | 0 refills | Status: AC

## 2019-09-18 MED ORDER — PREDNISONE 10 MG (21) PO TBPK: ORAL_TABLET | ORAL | 0 refills | Status: DC

## 2019-09-18 NOTE — Progress Notes (Addendum)
Virtual Visit via Telephone Note  I connected with Brenda Cruz on 09/18/19 at 3:45 PM by telephone and verified that I am speaking with the correct person using two identifiers. Brenda Cruz is currently located at home and nobody is currently with her during this visit. The provider, Loman Brooklyn, [name] is located in their office at time of visit.  I discussed the limitations, risks, security and privacy concerns of performing an evaluation and management service by telephone and the availability of in person appointments. I also discussed with the patient that there may be a patient responsible charge related to this service. The patient expressed understanding and agreed to proceed.  Subjective: PCP: Brenda Pretty, [name]  Chief Complaint  Patient presents with  . Cough   Patient complains of cough, headache and chest tightness. Additional symptoms include head/chest congestion, runny nose, sneezing, sore throat, fever, postnasal drainage, shortness of breath and diarrhea. Onset of symptoms was 4 days ago, unchanged since that time. She is drinking plenty of fluids. Evaluation to date: none. Treatment to date: cough suppressants, Tylenol and Duonebs. She has a history of asthma. She does smoke.  Patient was tested for COVID earlier today with a rapid test which was negative.   ROS: Per HPI  Current Outpatient Medications:  .  albuterol (PROVENTIL HFA;VENTOLIN HFA) 108 (90 Base) MCG/ACT inhaler, Inhale 2 puffs into the lungs every 4 (four) hours as needed for wheezing or shortness of breath., Disp: 1 Inhaler, Rfl: 2 .  amLODipine (NORVASC) 2.5 MG tablet, TAKE ONE (1) TABLET EACH DAY (Needs to be seen before next refill), Disp: 30 tablet, Rfl: 0 .  aspirin EC 81 MG tablet, Take 81 mg by mouth every evening. , Disp: , Rfl:  .  diphenhydrAMINE (BENADRYL) 25 MG tablet, Take 25 mg by mouth every 6 (six) hours as needed for itching or allergies., Disp: , Rfl:  .  esomeprazole  (NEXIUM) 40 MG capsule, TAKE ONE (1) CAPSULE EACH DAY, Disp: 90 capsule, Rfl: 1 .  FLUoxetine (PROZAC) 40 MG capsule, Take 1 capsule (40 mg total) by mouth daily., Disp: 90 capsule, Rfl: 3 .  gabapentin (NEURONTIN) 100 MG capsule, Take 1-3 capsules (100-300 mg total) by mouth at bedtime., Disp: 90 capsule, Rfl: 3 .  ipratropium-albuterol (DUONEB) 0.5-2.5 (3) MG/3ML SOLN, Take 3 mLs by nebulization every 6 (six) hours as needed for Wheezing., Disp: , Rfl:  .  isosorbide mononitrate (IMDUR) 30 MG 24 hr tablet, Take 0.5 tablets (15 mg total) by mouth daily., Disp: 45 tablet, Rfl: 3 .  LORazepam (ATIVAN) 1 MG tablet, Take 1 tablet (1 mg total) by mouth 2 (two) times daily., Disp: 60 tablet, Rfl: 1 .  magnesium oxide (MAG-OX) 400 MG tablet, Take by mouth., Disp: , Rfl:  .  metFORMIN (GLUCOPHAGE) 500 MG tablet, Take 1 tablet (500 mg total) by mouth 2 (two) times daily with a meal. (Needs to be seen before next refill) (Patient taking differently: Take 500 mg by mouth daily. (Needs to be seen before next refill)), Disp: 60 tablet, Rfl: 0 .  metoprolol tartrate (LOPRESSOR) 50 MG tablet, Take 1 tablet (50 mg total) by mouth 2 (two) times daily. (Needs to be seen before next refill), Disp: 60 tablet, Rfl: 0 .  Multiple Vitamin (MULTIVITAMIN WITH MINERALS) TABS tablet, Take 1 tablet by mouth at bedtime. , Disp: , Rfl:  .  nitroGLYCERIN (NITROSTAT) 0.4 MG SL tablet, Place 1 tablet (0.4 mg total) under the tongue every 5 (  five) minutes as needed for chest pain., Disp: 90 tablet, Rfl: 0 .  ondansetron (ZOFRAN) 8 MG tablet, Take by mouth., Disp: , Rfl:  .  Probiotic Product (RA PROBIOTIC GUMMIES) CHEW, Chew 1 each by mouth at bedtime. , Disp: , Rfl:  .  rosuvastatin (CRESTOR) 40 MG tablet, TAKE ONE (1) TABLET EACH DAY, Disp: 90 tablet, Rfl: 1 .  Trastuzumab (HERCEPTIN IV), Inject into the vein., Disp: , Rfl:   Allergies  Allergen Reactions  . Atorvastatin Other (See Comments)    Myalgia   . Iohexol Itching,  Rash and Other (See Comments)    Desc: White blisters in mouth during ivp in [facility] '93, ok w/ 13 hour prep today//a.calhoun, Onset Date: 10/30/1991   . Ace Inhibitors Cough  . 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 Penicillins Itching and Rash    Has patient had a PCN reaction causing immediate rash, facial/tongue/throat swelling, SOB or lightheadedness with hypotension: Yes Has patient had a PCN reaction causing severe rash involving mucus membranes or skin necrosis: No Has patient had a PCN reaction that required hospitalization: No Has patient had a PCN reaction occurring within the last 10 years: No If all of the above answers are "NO", then may proceed with Cephalosporin use.  . Sulfa Antibiotics Itching and Rash   Past Medical History:  Diagnosis Date  . Anxiety   . Asthma   . DDD (degenerative disc disease)   . GERD (gastroesophageal reflux disease)   . Hemorrhoids   . HIT (heparin-induced thrombocytopenia) ([place])   . Hyperlipemia   . Hypertension   . 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 Iron deficiency anemia 11/23/2015  . Pulmonary emboli ([place]) 12/07/2015  . TMJ (temporomandibular joint disorder)     Observations/Objective: A&O  No respiratory distress or wheezing audible over the phone. She is hoarse and was coughing. Mood, judgement, and thought processes all WNL  Assessment and Plan: 1. Bacterial respiratory infection - COVID negative.  Symptom management. - predniSONE (STERAPRED UNI-PAK 21 TAB) 10 MG (21) TBPK tablet; Use as directed on back of pill pack  Dispense: 21 tablet; Refill: 0 - cefpodoxime (VANTIN) 200 MG tablet; Take 1 tablet (200 mg total) by mouth 2 (two) times daily for 7 days.  Dispense: 14 tablet; Refill: 0  Addendum: cefpodoxime not covered by insurance; verbal order given to pharmacy for cefdinir 300 mg PO BID x10 days.  Follow Up Instructions:  I discussed the assessment and  treatment plan with the patient. The patient was provided an opportunity to ask questions and all were answered. The patient agreed with the plan and demonstrated an understanding of the instructions.   The patient was advised to call back or seek an in-person evaluation if the symptoms worsen or if the condition fails to improve as anticipated.  The above assessment and management plan was discussed with the patient. The patient verbalized understanding of and has agreed to the management plan. Patient is aware to call the clinic if symptoms persist or worsen. Patient is aware when to return to the clinic for a follow-up visit. Patient educated on when it is appropriate to go to the emergency department.   Time call ended: 3:53 PM  I provided 11 minutes of non-face-to-face time during this encounter.  Hendricks Limes, MSN, APRN, [name]-C [facility] 09/18/19

## 2019-10-09 ENCOUNTER — Other Ambulatory Visit: Payer: Self-pay | Admitting: Nurse Practitioner

## 2019-10-09 DIAGNOSIS — F411 Generalized anxiety disorder: Secondary | ICD-10-CM

## 2019-10-09 DIAGNOSIS — F3341 Major depressive disorder, recurrent, in partial remission: Secondary | ICD-10-CM

## 2019-10-10 ENCOUNTER — Other Ambulatory Visit: Payer: Self-pay | Admitting: Nurse Practitioner

## 2019-10-10 ENCOUNTER — Other Ambulatory Visit: Payer: Self-pay | Admitting: Physician Assistant

## 2019-10-10 DIAGNOSIS — I10 Essential (primary) hypertension: Secondary | ICD-10-CM

## 2019-10-14 ENCOUNTER — Other Ambulatory Visit: Payer: Self-pay | Admitting: Nurse Practitioner

## 2019-10-14 DIAGNOSIS — I10 Essential (primary) hypertension: Secondary | ICD-10-CM

## 2019-11-04 ENCOUNTER — Other Ambulatory Visit: Payer: Self-pay

## 2019-11-04 ENCOUNTER — Ambulatory Visit (INDEPENDENT_AMBULATORY_CARE_PROVIDER_SITE_OTHER): Payer: Managed Care, Other (non HMO) | Admitting: Nurse Practitioner

## 2019-11-04 ENCOUNTER — Encounter: Payer: Self-pay | Admitting: Nurse Practitioner

## 2019-11-04 DIAGNOSIS — E1159 Type 2 diabetes mellitus with other circulatory complications: Secondary | ICD-10-CM | POA: Diagnosis not present

## 2019-11-04 DIAGNOSIS — Z20822 Contact with and (suspected) exposure to covid-19: Secondary | ICD-10-CM | POA: Diagnosis not present

## 2019-11-04 MED ORDER — BLOOD GLUCOSE METER KIT: PACK | 0 refills | Status: AC

## 2019-11-04 MED ORDER — METFORMIN HCL 500 MG PO TABS: 500.0000 mg | ORAL_TABLET | Freq: Two times a day (BID) | ORAL | 0 refills | Status: DC

## 2019-11-04 NOTE — Progress Notes (Signed)
Virtual Visit via telephone Note Due to COVID-19 pandemic this visit was conducted virtually. This visit type was conducted due to national recommendations for restrictions regarding the COVID-19 Pandemic (e.g. social distancing, sheltering in place) in an effort to limit this patient's exposure and mitigate transmission in our community. All issues noted in this document were discussed and addressed.  A physical exam was not performed with this format.  I connected with Brenda Cruz on 11/04/19 at 9:10 by telephone and verified that I am speaking with the correct person using two identifiers. Brenda Cruz is currently located at home and no one is currently with her during visit. The provider, [name] [name], [name] is located in their office at time of visit.  I discussed the limitations, risks, security and privacy concerns of performing an evaluation and management service by telephone and the availability of in person appointments. I also discussed with the patient that there may be a patient responsible charge related to this service. The patient expressed understanding and agreed to proceed.   History and Present Illness:   Chief Complaint: Blood Sugar Problem   HPI Patient calls in to discuss her blood sugars. She states that her blood sugar Friday evening was 333. She cannot remember when she had eaten in relation to that. This morning her fasting sugar was 213. She has not been taking her metformin but 1x a day because she just was not following directions. She had not been checking blood sugars and decided to check them Friday and that is when she realized it was high. She also says she has been treaveling a lot with her job and she has been in areas with high covid positive people. She says she is slightly nauseated, with bad nasal congestion and fatigue. Her co worker is running a fever today.   *looking at rx she has nit had refill on metformin since August of 2020 so she  has missed taing it a lot over the last several months.  Review of Systems  Constitutional: Positive for malaise/fatigue. Negative for chills and fever.  HENT: Positive for congestion. Negative for sore throat.   Respiratory: Positive for cough (slight).   Musculoskeletal: Negative for myalgias.  Neurological: Negative for headaches.  All other systems reviewed and are negative.    Observations/Objective: Alert and oriented- answers all questions appropriately No distress Sight cough noted    Assessment and Plan:  Brenda Cruz in today with chief complaint of Blood Sugar Problem   1. Type 2 diabetes mellitus with other circulatory complication, without long-term current use of insulin (HCC) rx for metformin sent in- take BID as directed Keep diary of fasting blood sugars Strict low carb diet Follow up in 2 weeks if covid test is negative. Call if having any issues - metFORMIN (GLUCOPHAGE) 500 MG tablet; Take 1 tablet (500 mg total) by mouth 2 (two) times daily with a meal. (Needs to be seen before next refill)  Dispense: 60 tablet; Refill: 0  2. Contact with and (suspected) exposure to covid-19 She can be tested at work. Quarantine until test results ar back    Follow Up Instructions: As scheduled    I discussed the assessment and treatment plan with the patient. The patient was provided an opportunity to ask questions and all were answered. The patient agreed with the plan and demonstrated an understanding of the instructions.   The patient was advised to call back or seek an in-person evaluation if the symptoms worsen  or if the condition fails to improve as anticipated.  The above assessment and management plan was discussed with the patient. The patient verbalized understanding of and has agreed to the management plan. Patient is aware to call the clinic if symptoms persist or worsen. Patient is aware when to return to the clinic for a follow-up visit. Patient  educated on when it is appropriate to go to the emergency department.   Time call ended:  9:35  I provided 25 minutes of non-face-to-face time during this encounter.    [name] [name], [name]

## 2019-11-09 ENCOUNTER — Other Ambulatory Visit: Payer: Self-pay | Admitting: Cardiology

## 2019-11-09 ENCOUNTER — Other Ambulatory Visit: Payer: Self-pay | Admitting: Nurse Practitioner

## 2019-11-09 DIAGNOSIS — I10 Essential (primary) hypertension: Secondary | ICD-10-CM

## 2019-11-09 DIAGNOSIS — F411 Generalized anxiety disorder: Secondary | ICD-10-CM

## 2019-11-09 DIAGNOSIS — F3341 Major depressive disorder, recurrent, in partial remission: Secondary | ICD-10-CM

## 2019-11-11 ENCOUNTER — Other Ambulatory Visit: Payer: Self-pay | Admitting: Nurse Practitioner

## 2019-11-18 ENCOUNTER — Ambulatory Visit: Payer: Self-pay | Admitting: Nurse Practitioner

## 2019-11-21 ENCOUNTER — Ambulatory Visit (INDEPENDENT_AMBULATORY_CARE_PROVIDER_SITE_OTHER): Payer: Managed Care, Other (non HMO) | Admitting: Nurse Practitioner

## 2019-11-21 ENCOUNTER — Encounter: Payer: Self-pay | Admitting: Nurse Practitioner

## 2019-11-21 DIAGNOSIS — C50912 Malignant neoplasm of unspecified site of left female breast: Secondary | ICD-10-CM

## 2019-11-21 DIAGNOSIS — E1165 Type 2 diabetes mellitus with hyperglycemia: Secondary | ICD-10-CM | POA: Diagnosis not present

## 2019-11-21 DIAGNOSIS — Z720 Tobacco use: Secondary | ICD-10-CM

## 2019-11-21 DIAGNOSIS — K219 Gastro-esophageal reflux disease without esophagitis: Secondary | ICD-10-CM | POA: Diagnosis not present

## 2019-11-21 DIAGNOSIS — D509 Iron deficiency anemia, unspecified: Secondary | ICD-10-CM

## 2019-11-21 DIAGNOSIS — F3341 Major depressive disorder, recurrent, in partial remission: Secondary | ICD-10-CM

## 2019-11-21 DIAGNOSIS — I25119 Atherosclerotic heart disease of native coronary artery with unspecified angina pectoris: Secondary | ICD-10-CM

## 2019-11-21 DIAGNOSIS — F411 Generalized anxiety disorder: Secondary | ICD-10-CM

## 2019-11-21 DIAGNOSIS — E669 Obesity, unspecified: Secondary | ICD-10-CM

## 2019-11-21 DIAGNOSIS — E1140 Type 2 diabetes mellitus with diabetic neuropathy, unspecified: Secondary | ICD-10-CM

## 2019-11-21 DIAGNOSIS — J441 Chronic obstructive pulmonary disease with (acute) exacerbation: Secondary | ICD-10-CM

## 2019-11-21 DIAGNOSIS — I10 Essential (primary) hypertension: Secondary | ICD-10-CM | POA: Diagnosis not present

## 2019-11-21 DIAGNOSIS — I2510 Atherosclerotic heart disease of native coronary artery without angina pectoris: Secondary | ICD-10-CM | POA: Diagnosis not present

## 2019-11-21 DIAGNOSIS — E785 Hyperlipidemia, unspecified: Secondary | ICD-10-CM

## 2019-11-21 MED ORDER — ESOMEPRAZOLE MAGNESIUM 40 MG PO CPDR: 40.0000 mg | DELAYED_RELEASE_CAPSULE | Freq: Every day | ORAL | 1 refills | Status: DC

## 2019-11-21 MED ORDER — METFORMIN HCL 1000 MG PO TABS: 1000.0000 mg | ORAL_TABLET | Freq: Two times a day (BID) | ORAL | 1 refills | Status: DC

## 2019-11-21 MED ORDER — LORAZEPAM 1 MG PO TABS: 1.0000 mg | ORAL_TABLET | Freq: Two times a day (BID) | ORAL | 1 refills | Status: DC

## 2019-11-21 MED ORDER — METOPROLOL TARTRATE 50 MG PO TABS: 50.0000 mg | ORAL_TABLET | Freq: Two times a day (BID) | ORAL | 1 refills | Status: DC

## 2019-11-21 MED ORDER — AMLODIPINE BESYLATE 2.5 MG PO TABS: ORAL_TABLET | ORAL | 1 refills | Status: DC

## 2019-11-21 MED ORDER — ESCITALOPRAM OXALATE 20 MG PO TABS: 20.0000 mg | ORAL_TABLET | Freq: Every day | ORAL | 5 refills | Status: DC

## 2019-11-21 MED ORDER — GABAPENTIN 100 MG PO CAPS: ORAL_CAPSULE | ORAL | 1 refills | Status: DC

## 2019-11-21 MED ORDER — TRAMADOL HCL 50 MG PO TABS: 50.0000 mg | ORAL_TABLET | Freq: Two times a day (BID) | ORAL | 1 refills | Status: AC

## 2019-11-21 MED ORDER — ROSUVASTATIN CALCIUM 40 MG PO TABS: 40.0000 mg | ORAL_TABLET | Freq: Every day | ORAL | 1 refills | Status: DC

## 2019-11-21 NOTE — Progress Notes (Signed)
Virtual Visit via telephone Note Due to COVID-19 pandemic this visit was conducted virtually. This visit type was conducted due to national recommendations for restrictions regarding the COVID-19 Pandemic (e.g. social distancing, sheltering in place) in an effort to limit this patient's exposure and mitigate transmission in our community. All issues noted in this document were discussed and addressed.  A physical exam was not performed with this format.  I connected with Brenda Cruz on 11/21/19 at 11:15 by telephone and verified that I am speaking with the correct person using two identifiers. Brenda Cruz is currently located at home and no one is currently with  her during visit. The provider, [name] [name], [name] is located in their office at time of visit.  I discussed the limitations, risks, security and privacy concerns of performing an evaluation and management service by telephone and the availability of in person appointments. I also discussed with the patient that there may be a patient responsible charge related to this service. The patient expressed understanding and agreed to proceed.   History and Present Illness:   Chief Complaint: Medical Management of Chronic Issues    HPI:  1. Essential hypertension Does have occasional chest pain, no sob or headache. She has blood pressure checked at work sometimes and it is high sometimes and normal other times. BP Readings from Last 3 Encounters:  07/02/19 (!) 142/80  06/19/19 136/78  10/04/18 (!) 129/93     2. Coronary artery disease involving native coronary artery of native heart without angina pectoris She last saw cardiologist in [place], 2020. She had stress test 06/28/19 which was normal. No changes were made to plan of care according to office note.  3. Gastroesophageal reflux disease without esophagitis Is on nexium daily and works well to keep symptoms under control.  4. Type 2 diabetes mellitus with  hyperglycemia, without long-term current use of insulin (HCC) Fasting blood sugars are high over 200 daily.. We recently increased metformin to 573m BID because sh ewas only taking it 1x a day. Lab Results  Component Value Date   HGBA1C 7.5 (H) 07/20/2018     5. Hyperlipidemia, unspecified hyperlipidemia type She does not really watch diet very closely. She does no exercise. Lab Results  Component Value Date   CHOL 147 08/29/2017   HDL 35 (L) 08/29/2017   LDLCALC 78 08/29/2017   TRIG 170 (H) 08/29/2017   CHOLHDL 4.2 08/29/2017     6. Recurrent major depressive disorder, in partial remission ([place] Is on prozac and most days is doing well. She has not tried any other antidpressants other than zoloft [facility] 11/21/2019 10/04/2018 09/22/2018  Decreased Interest 1 0 0  Down, Depressed, Hopeless 1 0 0  PHQ - 2 Score 2 0 0  Altered sleeping 0 - -  Tired, decreased energy 1 - -  Change in appetite 0 - -  Feeling bad or failure about yourself  0 - -  Trouble concentrating 1 - -  Moving slowly or fidgety/restless 0 - -  Suicidal thoughts 0 - -  PHQ-9 Score 4 - -  Difficult doing work/chores Somewhat difficult - -  Some recent data might be hidden     7. GAD (generalized anxiety disorder) Is on ativan and only takes nightly because her anxiety is worse at night. GAD 7 : Generalized Anxiety Score 11/21/2019  Nervous, Anxious, on Edge 2  Control/stop worrying 2  Worry too much - different things 2  Trouble relaxing 1  Restless 0  Easily annoyed or irritable 0  Afraid - awful might happen 1  Total GAD 7 Score 8  Anxiety Difficulty Somewhat difficult      8. Iron deficiency anemia, unspecified iron deficiency anemia type Has some fatigue. Is no longer taking an iron supplement.  9. Obesity with body mass index of 30.0-39.9 Her weight is down 10lbs Wt Readings from Last 3 Encounters:  07/02/19 230 lb (104.3 kg)  06/28/19 229 lb (103.9 kg)  06/19/19 229 lb  (103.9 kg)     10. Infiltrating ductal carcinoma of left breast ([place]) Had mammogram yesterdayand was normal  11. Tobacco abuse Still smokes over a 1/2 pack a day.  12. neuropathy neurontin helps because her feethurt all the time.   Outpatient Encounter Medications as of 11/21/2019  Medication Sig  . albuterol (PROVENTIL HFA;VENTOLIN HFA) 108 (90 Base) MCG/ACT inhaler Inhale 2 puffs into the lungs every 4 (four) hours as needed for wheezing or shortness of breath.  Marland Kitchen amLODipine (NORVASC) 2.5 MG tablet TAKE ONE (1) TABLET EACH DAY (Needs to be seen before next refill)  . aspirin EC 81 MG tablet Take 81 mg by mouth every evening.   . blood glucose meter kit and supplies Dispense based on patient and insurance preference. Use up to four times daily as directed. (FOR ICD-10 E10.9, E11.9).  Marland Kitchen diphenhydrAMINE (BENADRYL) 25 MG tablet Take 25 mg by mouth every 6 (six) hours as needed for itching or allergies.  Marland Kitchen esomeprazole (NEXIUM) 40 MG capsule TAKE ONE (1) CAPSULE EACH DAY  . FLUoxetine (PROZAC) 40 MG capsule TAKE ONE (1) CAPSULE EACH DAY  . gabapentin (NEURONTIN) 100 MG capsule TAKE 1 TO 3 CAPSULES AT BEDTIME  . ipratropium-albuterol (DUONEB) 0.5-2.5 (3) MG/3ML SOLN Take 3 mLs by nebulization every 6 (six) hours as needed for Wheezing.  . isosorbide mononitrate (IMDUR) 30 MG 24 hr tablet Take 0.5 tablets (15 mg total) by mouth daily.  Marland Kitchen LORazepam (ATIVAN) 1 MG tablet Take 1 tablet (1 mg total) by mouth 2 (two) times daily.  . magnesium oxide (MAG-OX) 400 MG tablet Take by mouth.  . metFORMIN (GLUCOPHAGE) 500 MG tablet Take 1 tablet (500 mg total) by mouth 2 (two) times daily with a meal. (Needs to be seen before next refill)  . metoprolol tartrate (LOPRESSOR) 50 MG tablet TAKE ONE TABLET BY MOUTH TWICE DAILY  . Multiple Vitamin (MULTIVITAMIN WITH MINERALS) TABS tablet Take 1 tablet by mouth at bedtime.   . nitroGLYCERIN (NITROSTAT) 0.4 MG SL tablet Place 1 tablet (0.4 mg total) under the  tongue every 5 (five) minutes as needed for chest pain.  Marland Kitchen ondansetron (ZOFRAN) 8 MG tablet Take by mouth.  . predniSONE (STERAPRED UNI-PAK 21 TAB) 10 MG (21) TBPK tablet Use as directed on back of pill pack  . Probiotic Product (RA PROBIOTIC GUMMIES) CHEW Chew 1 each by mouth at bedtime.   . rosuvastatin (CRESTOR) 40 MG tablet TAKE ONE (1) TABLET EACH DAY  . Trastuzumab (HERCEPTIN IV) Inject into the vein.     Past Surgical History:  Procedure Laterality Date  . CARDIAC CATHETERIZATION N/A 10/29/2015   Procedure: Left Heart Cath and Coronary Angiography;  Surgeon: [name], MD;  Location: [facility];  Service: Cardiovascular;  Laterality: N/A;  . CHOLECYSTECTOMY    . CORONARY ARTERY BYPASS GRAFT N/A 11/02/2015   Procedure: CORONARY ARTERY BYPASS GRAFTING (CABG)x 1 using left internal mammary artery.;  Surgeon: Melrose Nakayama, MD;  Location: [facility];  Service: Open Heart Surgery;  Laterality: N/A;  . LEFT OOPHORECTOMY Left    Related to tubal torsion.  Bilateral salpingectomy also.  Remaining uterus and right ovary  . LUMBAR FUSION    . TEE WITHOUT CARDIOVERSION N/A 11/02/2015   Procedure: TRANSESOPHAGEAL ECHOCARDIOGRAM (TEE);  Surgeon: Melrose Nakayama, MD;  Location: [facility];  Service: Open Heart Surgery;  Laterality: N/A;    Family History  Problem Relation Age of Onset  . Colon polyps Father   . Stomach cancer Paternal Grandmother   . Esophageal cancer Maternal Grandfather   . Breast cancer Mother   . Ovarian cancer Other        Maternal Side Great  Aunt  . Colon cancer Neg Hx     New complaints: None today  Social history: Live with her husband. She works for nursing facility  Controlled substance contract: will have signe at next visit    Review of Systems  Constitutional: Negative for diaphoresis and weight loss.  Eyes: Negative for blurred vision, double vision and pain.  Respiratory: Negative for shortness of breath.   Cardiovascular:  Negative for chest pain, palpitations, orthopnea and leg swelling.  Gastrointestinal: Negative for abdominal pain.  Skin: Negative for rash.  Neurological: Negative for dizziness, sensory change, loss of consciousness, weakness and headaches.  Endo/Heme/Allergies: Negative for polydipsia. Does not bruise/bleed easily.  Psychiatric/Behavioral: Negative for memory loss. The patient does not have insomnia.   All other systems reviewed and are negative.    Observations/Objective: Alert and oriented- answers all questions appropriately No distress    Assessment and Plan: Brenda Cruz comes in today with chief complaint of Medical Management of Chronic Issues   Diagnosis and orders addressed:  1. Essential hypertension Low sodium diet - metoprolol tartrate (LOPRESSOR) 50 MG tablet; Take 1 tablet (50 mg total) by mouth 2 (two) times daily.  Dispense: 180 tablet; Refill: 1 - amLODipine (NORVASC) 2.5 MG tablet; TAKE ONE (1) TABLET EACH DAY (Needs to be seen before next refill)  Dispense: 90 tablet; Refill: 1  2. Coronary artery disease involving native coronary artery of native heart without angina pectoris Keep follow up with cardiology  3. Gastroesophageal reflux disease without esophagitis Avoid spicy foods Do not eat 2 hours prior to bedtime - esomeprazole (NEXIUM) 40 MG capsule; Take 1 capsule (40 mg total) by mouth daily.  Dispense: 90 capsule; Refill: 1  4. Type 2 diabetes mellitus with hyperglycemia, without long-term current use of insulin (HCC) Increased metformin to 100)mg BID Continue to keep diary of blood sugars Carb counting encourage - metFORMIN (GLUCOPHAGE) 1000 MG tablet; Take 1 tablet (1,000 mg total) by mouth 2 (two) times daily with a meal.  Dispense: 180 tablet; Refill: 1  5. Hyperlipidemia, unspecified hyperlipidemia type Low fat diet - rosuvastatin (CRESTOR) 40 MG tablet; Take 1 tablet (40 mg total) by mouth daily.  Dispense: 90 tablet; Refill: 1  6.  Recurrent major depressive disorder, in partial remission ([place]) Changed from prozac to lexapro - escitalopram (LEXAPRO) 20 MG tablet; Take 1 tablet (20 mg total) by mouth daily.  Dispense: 30 tablet; Refill: 5  7. GAD (generalized anxiety disorder) stres smanagement - LORazepam (ATIVAN) 1 MG tablet; Take 1 tablet (1 mg total) by mouth 2 (two) times daily.  Dispense: 60 tablet; Refill: 1  8. Iron deficiency anemia, unspecified iron deficiency anemia type Will recheck labs in 1 month Multivitamin with iron encouraged  9. Obesity with body mass index of 30.0-39.9 Discussed diet and exercise for  person with BMI >25 Will recheck weight in 3-6 months   10. 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) Continue mommograms as sceduled  11. Tobacco abuse Smoking cessation encouraged  12. Type 2 diabetes mellitus with diabetic neuropathy, without long-term current use of insulin (HCC) Do not go barefooted cjeck feet daily - gabapentin (NEURONTIN) 100 MG capsule; TAKE 1 TO 3 CAPSULES AT BEDTIME  Dispense: 90 capsule; Refill: 1 - traMADol (ULTRAM) 50 MG tablet; Take 1 tablet (50 mg total) by mouth 2 (two) times daily.  Dispense: 60 tablet; Refill: 1   Labs pending Health Maintenance reviewed Diet and exercise encouraged  Follow Up Instructions: 1 month as scheduled    I discussed the assessment and treatment plan with the patient. The patient was provided an opportunity to ask questions and all were answered. The patient agreed with the plan and demonstrated an understanding of the instructions.   The patient was advised to call back or seek an in-person evaluation if the symptoms worsen or if the condition fails to improve as anticipated.  The above assessment and management plan was discussed with the patient. The patient verbalized understanding of and has agreed to the management plan. Patient is aware to call the clinic if symptoms persist or worsen. Patient is aware when to  return to the clinic for a follow-up visit. Patient educated on when it is appropriate to go to the emergency department.   Time call ended:  11:35  I provided 20 minutes of non-face-to-face time during this encounter.    [name] [name], [name]

## 2019-12-30 ENCOUNTER — Ambulatory Visit (INDEPENDENT_AMBULATORY_CARE_PROVIDER_SITE_OTHER): Payer: Managed Care, Other (non HMO) | Admitting: Physician Assistant

## 2019-12-30 ENCOUNTER — Encounter: Payer: Self-pay | Admitting: Physician Assistant

## 2019-12-30 DIAGNOSIS — R059 Cough, unspecified: Secondary | ICD-10-CM

## 2019-12-30 DIAGNOSIS — R05 Cough: Secondary | ICD-10-CM

## 2019-12-30 DIAGNOSIS — U071 COVID-19: Secondary | ICD-10-CM | POA: Diagnosis not present

## 2019-12-30 DIAGNOSIS — J189 Pneumonia, unspecified organism: Secondary | ICD-10-CM | POA: Diagnosis not present

## 2019-12-30 MED ORDER — ALBUTEROL SULFATE HFA 108 (90 BASE) MCG/ACT IN AERS: 2.0000 | INHALATION_SPRAY | RESPIRATORY_TRACT | 1 refills | Status: AC | PRN

## 2019-12-30 MED ORDER — PREDNISONE 10 MG (48) PO TBPK: ORAL_TABLET | ORAL | 0 refills | Status: DC

## 2019-12-30 MED ORDER — AZITHROMYCIN 250 MG PO TABS: ORAL_TABLET | ORAL | 0 refills | Status: DC

## 2019-12-30 MED ORDER — ONDANSETRON 8 MG PO TBDP: 8.0000 mg | ORAL_TABLET | Freq: Three times a day (TID) | ORAL | 0 refills | Status: DC | PRN

## 2019-12-30 NOTE — Progress Notes (Signed)
Telephone visit  Subjective: CC: Recent Covid infection, worsening bronchitis possible pneumonia PCP: [name], [name] Brenda Cruz is a 54 y.o. female calls for telephone consult today. Patient provides verbal consent for consult held via phone.  Patient is identified with 2 separate identifiers.  At this time the entire area is on COVID-19 social distancing and stay home orders are in place.  Patient is of higher risk and therefore we are performing this by a virtual method.  Location of patient: Home Location of provider: [facility] Others present for call: No  On February 26 patient was diagnosed with Covid.  She felt like she had gotten some better.  She did have a lot of cough and fever at the time.  And did get better but in the last 4 days has gotten much worse.  She is having coughing and wheezing.  She has been using her nebulizer but feels like it is making her worse.  She is getting up thick sputum.  It does have white and yellow mucus.  She denies seeing any blood.  The patient does have chronic medical conditions and is at high risk for infection.  She is also experiencing a lot of nausea.    ROS: Per HPI  Allergies  Allergen Reactions  . Atorvastatin Other (See Comments)    Myalgia   . Iohexol Itching, Rash and Other (See Comments)    Desc: White blisters in mouth during ivp in [facility] '93, ok w/ 13 hour prep today//a.[facility], Onset Date: 10/30/1991   . Ace Inhibitors Cough  . 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 Penicillins Itching and Rash    Has patient had a PCN reaction causing immediate rash, facial/tongue/throat swelling, SOB or lightheadedness with hypotension: Yes Has patient had a PCN reaction causing severe rash involving mucus membranes or skin necrosis: No Has patient had a PCN reaction that required hospitalization: No Has patient had a PCN reaction occurring within the last 10 years:  No If all of the above answers are "NO", then may proceed with Cephalosporin use.  . Sulfa Antibiotics Itching and Rash   Past Medical History:  Diagnosis Date  . Anxiety   . Asthma   . DDD (degenerative disc disease)   . GERD (gastroesophageal reflux disease)   . Hemorrhoids   . HIT (heparin-induced thrombocytopenia) ([place])   . Hyperlipemia   . Hypertension   . Infiltrating ductal carcinoma of left breast ([place]) 12/15/2017  . Iron deficiency anemia 11/23/2015  . Pulmonary emboli ([place]) 12/07/2015  . TMJ (temporomandibular joint disorder)   . Type 2 diabetes mellitus ([place]) 12/15/2017    Current Outpatient Medications:  .  albuterol (VENTOLIN HFA) 108 (90 Base) MCG/ACT inhaler, Inhale 2 puffs into the lungs every 4 (four) hours as needed for wheezing or shortness of breath., Disp: 18 g, Rfl: 1 .  amLODipine (NORVASC) 2.5 MG tablet, TAKE ONE (1) TABLET EACH DAY (Needs to be seen before next refill), Disp: 90 tablet, Rfl: 1 .  aspirin EC 81 MG tablet, Take 81 mg by mouth every evening. , Disp: , Rfl:  .  azithromycin (ZITHROMAX Z-PAK) 250 MG tablet, Take as directed, Disp: 6 each, Rfl: 0 .  blood glucose meter kit and supplies, Dispense based on patient and insurance preference. Use up to four times daily as directed. (FOR ICD-10 E10.9, E11.9)., Disp: 1 each, Rfl: 0 .  diphenhydrAMINE (BENADRYL) 25 MG tablet,  Take 25 mg by mouth every 6 (six) hours as needed for itching or allergies., Disp: , Rfl:  .  escitalopram (LEXAPRO) 20 MG tablet, Take 1 tablet (20 mg total) by mouth daily., Disp: 30 tablet, Rfl: 5 .  esomeprazole (NEXIUM) 40 MG capsule, Take 1 capsule (40 mg total) by mouth daily., Disp: 90 capsule, Rfl: 1 .  gabapentin (NEURONTIN) 100 MG capsule, TAKE 1 TO 3 CAPSULES AT BEDTIME, Disp: 90 capsule, Rfl: 1 .  ipratropium-albuterol (DUONEB) 0.5-2.5 (3) MG/3ML SOLN, Take 3 mLs by nebulization every 6 (six) hours as needed for Wheezing., Disp: , Rfl:  .  isosorbide mononitrate (IMDUR) 30 MG 24  hr tablet, Take 0.5 tablets (15 mg total) by mouth daily., Disp: 45 tablet, Rfl: 3 .  LORazepam (ATIVAN) 1 MG tablet, Take 1 tablet (1 mg total) by mouth 2 (two) times daily., Disp: 60 tablet, Rfl: 1 .  magnesium oxide (MAG-OX) 400 MG tablet, Take by mouth., Disp: , Rfl:  .  metFORMIN (GLUCOPHAGE) 1000 MG tablet, Take 1 tablet (1,000 mg total) by mouth 2 (two) times daily with a meal., Disp: 180 tablet, Rfl: 1 .  metoprolol tartrate (LOPRESSOR) 50 MG tablet, Take 1 tablet (50 mg total) by mouth 2 (two) times daily., Disp: 180 tablet, Rfl: 1 .  Multiple Vitamin (MULTIVITAMIN WITH MINERALS) TABS tablet, Take 1 tablet by mouth at bedtime. , Disp: , Rfl:  .  nitroGLYCERIN (NITROSTAT) 0.4 MG SL tablet, Place 1 tablet (0.4 mg total) under the tongue every 5 (five) minutes as needed for chest pain., Disp: 90 tablet, Rfl: 0 .  ondansetron (ZOFRAN ODT) 8 MG disintegrating tablet, Take 1 tablet (8 mg total) by mouth every 8 (eight) hours as needed for nausea or vomiting., Disp: 30 tablet, Rfl: 0 .  ondansetron (ZOFRAN) 8 MG tablet, Take by mouth., Disp: , Rfl:  .  predniSONE (STERAPRED UNI-PAK 48 TAB) 10 MG (48) TBPK tablet, Take as directed for 12 days, Disp: 48 tablet, Rfl: 0 .  Probiotic Product (RA PROBIOTIC GUMMIES) CHEW, Chew 1 each by mouth at bedtime. , Disp: , Rfl:  .  rosuvastatin (CRESTOR) 40 MG tablet, Take 1 tablet (40 mg total) by mouth daily., Disp: 90 tablet, Rfl: 1 .  Trastuzumab (HERCEPTIN IV), Inject into the vein., Disp: , Rfl:   Assessment/ Plan: 54 y.o. female   1. Pneumonia due to infectious organism, unspecified laterality, unspecified part of lung - predniSONE (STERAPRED UNI-PAK 48 TAB) 10 MG (48) TBPK tablet; Take as directed for 12 days  Dispense: 48 tablet; Refill: 0 - albuterol (VENTOLIN HFA) 108 (90 Base) MCG/ACT inhaler; Inhale 2 puffs into the lungs every 4 (four) hours as needed for wheezing or shortness of breath.  Dispense: 18 g; Refill: 1 - azithromycin (ZITHROMAX Z-PAK)  250 MG tablet; Take as directed  Dispense: 6 each; Refill: 0 - ondansetron (ZOFRAN ODT) 8 MG disintegrating tablet; Take 1 tablet (8 mg total) by mouth every 8 (eight) hours as needed for nausea or vomiting.  Dispense: 30 tablet; Refill: 0  2. Cough - albuterol (VENTOLIN HFA) 108 (90 Base) MCG/ACT inhaler; Inhale 2 puffs into the lungs every 4 (four) hours as needed for wheezing or shortness of breath.  Dispense: 18 g; Refill: 1  3. COVID-19   No follow-ups on file.  Continue all other maintenance medications as listed above.  I discussed the assessment and treatment plan with the patient. The patient was provided an opportunity to ask questions and all were answered. The  patient agreed with the plan and demonstrated an understanding of the instructions.   The patient was advised to call back or seek an in-person evaluation if the symptoms worsen or if the condition fails to improve as anticipated.   The above assessment and management plan was discussed with the patient. The patient verbalized understanding of and has agreed to the management plan. Patient is aware to call the clinic if symptoms persist or worsen. Patient is aware when to return to the clinic for a follow-up visit. Patient educated on when it is appropriate to go to the emergency department.    Start time: 4:49 PM End time: 5:11 PM  Meds ordered this encounter  Medications  . predniSONE (STERAPRED UNI-PAK 48 TAB) 10 MG (48) TBPK tablet    Sig: Take as directed for 12 days    Dispense:  48 tablet    Refill:  0    Order Specific Question:   Supervising Provider    Answer:   Janora Norlander [[id number]]  . albuterol (VENTOLIN HFA) 108 (90 Base) MCG/ACT inhaler    Sig: Inhale 2 puffs into the lungs every 4 (four) hours as needed for wheezing or shortness of breath.    Dispense:  18 g    Refill:  1    Order Specific Question:   Supervising Provider    Answer:   Janora Norlander [[id number]]  . azithromycin  (ZITHROMAX Z-PAK) 250 MG tablet    Sig: Take as directed    Dispense:  6 each    Refill:  0    Order Specific Question:   Supervising Provider    Answer:   Janora Norlander [[id number]]  . ondansetron (ZOFRAN ODT) 8 MG disintegrating tablet    Sig: Take 1 tablet (8 mg total) by mouth every 8 (eight) hours as needed for nausea or vomiting.    Dispense:  30 tablet    Refill:  0    Order Specific Question:   Supervising Provider    Answer:   Janora Norlander [[id number]]    Particia Nearing PA-C [place] [phone]

## 2020-01-13 ENCOUNTER — Telehealth (INDEPENDENT_AMBULATORY_CARE_PROVIDER_SITE_OTHER): Payer: Managed Care, Other (non HMO) | Admitting: Nurse Practitioner

## 2020-01-13 ENCOUNTER — Other Ambulatory Visit: Payer: Self-pay | Admitting: Nurse Practitioner

## 2020-01-13 ENCOUNTER — Encounter: Payer: Self-pay | Admitting: Nurse Practitioner

## 2020-01-13 DIAGNOSIS — U071 COVID-19: Secondary | ICD-10-CM

## 2020-01-13 DIAGNOSIS — F411 Generalized anxiety disorder: Secondary | ICD-10-CM

## 2020-01-13 MED ORDER — LEVOFLOXACIN 500 MG PO TABS: 500.0000 mg | ORAL_TABLET | Freq: Every day | ORAL | 0 refills | Status: DC

## 2020-01-13 NOTE — Progress Notes (Signed)
Virtual Visit via video Note   Due to COVID-19 pandemic this visit was conducted virtually. This visit type was conducted due to national recommendations for restrictions regarding the COVID-19 Pandemic (e.g. social distancing, sheltering in place) in an effort to limit this patient's exposure and mitigate transmission in our community. All issues noted in this document were discussed and addressed.  A physical exam was not performed with this format.  I connected with Brenda Cruz on 01/13/20 at 1:20 by video and verified that I am speaking with the correct person using two identifiers. Brenda Cruz is currently located at home and no one  is currently with her during visit. The provider, [name] [name], [name] is located in their office at time of visit.  I discussed the limitations, risks, security and privacy concerns of performing an evaluation and management service by telephone and the availability of in person appointments. I also discussed with the patient that there may be a patient responsible charge related to this service. The patient expressed understanding and agreed to proceed.   History and Present Illness:  Video visit today. Patient states she was diagnosed with corona virus on at end of February. She was given zithromax and prednisone and that helped some. But she is still dizzy, nauseated and is weak. Cough is nonproductive. Has had low grade fever around 99.8. Intermittent headache.   Review of Systems  Constitutional: Negative for diaphoresis and weight loss.  Eyes: Negative for blurred vision, double vision and pain.  Respiratory: Negative for shortness of breath.   Cardiovascular: Negative for chest pain, palpitations, orthopnea and leg swelling.  Gastrointestinal: Negative for abdominal pain.  Skin: Negative for rash.  Neurological: Negative for dizziness, sensory change, loss of consciousness, weakness and headaches.  Endo/Heme/Allergies: Negative for  polydipsia. Does not bruise/bleed easily.  Psychiatric/Behavioral: Negative for memory loss. The patient does not have insomnia.   All other systems reviewed and are negative.      Observations/Objective: Alert and oriented- answers all questions appropriately No distress Pale Cough- nonproductive   Assessment and Plan: Brenda Cruz in today with chief complaint of Covid Exposure   1. COVID-19 virus infection 1. Take meds as prescribed 2. Use a cool mist humidifier especially during the winter months and when heat has been humid. 3. Use saline nose sprays frequently 4. Saline irrigations of the nose can be very helpful if done frequently.  * 4X daily for 1 week*  * Use of a nettie pot can be helpful with this. Follow directions with this* 5. Drink plenty of fluids 6. Keep thermostat turn down low 7.For any cough or congestion  Use plain Mucinex- regular strength or max strength is fine   * Children- consult with Pharmacist for dosing 8. For fever or aces or pains- take tylenol or ibuprofen appropriate for age and weight.  * for fevers greater than 101 orally you may alternate ibuprofen and tylenol every  3 hours.   Meds ordered this encounter  Medications  . levofloxacin (LEVAQUIN) 500 MG tablet    Sig: Take 1 tablet (500 mg total) by mouth daily.    Dispense:  7 tablet    Refill:  0    Order Specific Question:   Supervising Provider    Answer:   Worthy Rancher [id number]    Work from home for 2 weeks      Follow Up Instructions: prn    I discussed the assessment and treatment plan with the patient. The patient  was provided an opportunity to ask questions and all were answered. The patient agreed with the plan and demonstrated an understanding of the instructions.   The patient was advised to call back or seek an in-person evaluation if the symptoms worsen or if the condition fails to improve as anticipated.  The above assessment and management plan  was discussed with the patient. The patient verbalized understanding of and has agreed to the management plan. Patient is aware to call the clinic if symptoms persist or worsen. Patient is aware when to return to the clinic for a follow-up visit. Patient educated on when it is appropriate to go to the emergency department.   Time call ended:1:40  I provided 20 minutes of face-to-face time during this encounter.    [name] [name], [name]

## 2020-01-21 ENCOUNTER — Ambulatory Visit: Payer: Self-pay | Admitting: Nurse Practitioner

## 2020-02-24 ENCOUNTER — Ambulatory Visit: Payer: Managed Care, Other (non HMO) | Admitting: Nurse Practitioner

## 2020-02-26 ENCOUNTER — Encounter: Payer: Self-pay | Admitting: Family Medicine

## 2020-02-26 ENCOUNTER — Ambulatory Visit (INDEPENDENT_AMBULATORY_CARE_PROVIDER_SITE_OTHER): Payer: Managed Care, Other (non HMO) | Admitting: Family Medicine

## 2020-02-26 DIAGNOSIS — J019 Acute sinusitis, unspecified: Secondary | ICD-10-CM | POA: Diagnosis not present

## 2020-02-26 MED ORDER — CEFDINIR 300 MG PO CAPS: 300.0000 mg | ORAL_CAPSULE | Freq: Two times a day (BID) | ORAL | 0 refills | Status: AC

## 2020-02-26 MED ORDER — PREDNISONE 10 MG (21) PO TBPK: ORAL_TABLET | ORAL | 0 refills | Status: DC

## 2020-02-26 NOTE — Progress Notes (Signed)
Virtual Visit via Telephone Note  I connected with Brenda Cruz on 02/26/20 at 11:51 AM by telephone and verified that I am speaking with the correct person using two identifiers. Brenda Cruz is currently located at home and nobody is currently with her during this visit. The provider, Loman Brooklyn, [name] is located in their office at time of visit.  I discussed the limitations, risks, security and privacy concerns of performing an evaluation and management service by telephone and the availability of in person appointments. I also discussed with the patient that there may be a patient responsible charge related to this service. The patient expressed understanding and agreed to proceed.  Subjective: PCP: [name], [name]  Chief Complaint  Patient presents with  . URI   Patient complains of cough, head/chest congestion, runny nose, sore throat, ear pain/pressure, facial pain/pressure, fever, postnasal drainage and wheezing. Onset of symptoms was 2 weeks ago, gradually worsening since that time. She is drinking plenty of fluids. Evaluation to date: none. Treatment to date: Tylenol sinus, Mucinex, Alza-selzter. She has a history of asthma. She does not smoke. Patient's sister is COVID-19 positive, but she reports she had symptoms before being exposed to her.    ROS: Per HPI  Current Outpatient Medications:  .  albuterol (VENTOLIN HFA) 108 (90 Base) MCG/ACT inhaler, Inhale 2 puffs into the lungs every 4 (four) hours as needed for wheezing or shortness of breath., Disp: 18 g, Rfl: 1 .  amLODipine (NORVASC) 2.5 MG tablet, TAKE ONE (1) TABLET EACH DAY (Needs to be seen before next refill), Disp: 90 tablet, Rfl: 1 .  aspirin EC 81 MG tablet, Take 81 mg by mouth every evening. , Disp: , Rfl:  .  blood glucose meter kit and supplies, Dispense based on patient and insurance preference. Use up to four times daily as directed. (FOR ICD-10 E10.9, E11.9)., Disp: 1 each, Rfl: 0 .   diphenhydrAMINE (BENADRYL) 25 MG tablet, Take 25 mg by mouth every 6 (six) hours as needed for itching or allergies., Disp: , Rfl:  .  escitalopram (LEXAPRO) 20 MG tablet, Take 1 tablet (20 mg total) by mouth daily., Disp: 30 tablet, Rfl: 5 .  esomeprazole (NEXIUM) 40 MG capsule, Take 1 capsule (40 mg total) by mouth daily., Disp: 90 capsule, Rfl: 1 .  FLUoxetine (PROZAC) 40 MG capsule, TAKE ONE (1) CAPSULE EACH DAY, Disp: 30 capsule, Rfl: 3 .  gabapentin (NEURONTIN) 100 MG capsule, TAKE 1 TO 3 CAPSULES AT BEDTIME, Disp: 90 capsule, Rfl: 1 .  ipratropium-albuterol (DUONEB) 0.5-2.5 (3) MG/3ML SOLN, Take 3 mLs by nebulization every 6 (six) hours as needed for Wheezing., Disp: , Rfl:  .  isosorbide mononitrate (IMDUR) 30 MG 24 hr tablet, Take 0.5 tablets (15 mg total) by mouth daily., Disp: 45 tablet, Rfl: 3 .  LORazepam (ATIVAN) 1 MG tablet, TAKE ONE TABLET BY MOUTH TWICE DAILY, Disp: 60 tablet, Rfl: 1 .  magnesium oxide (MAG-OX) 400 MG tablet, Take by mouth., Disp: , Rfl:  .  metFORMIN (GLUCOPHAGE) 1000 MG tablet, Take 1 tablet (1,000 mg total) by mouth 2 (two) times daily with a meal., Disp: 180 tablet, Rfl: 1 .  metoprolol tartrate (LOPRESSOR) 50 MG tablet, Take 1 tablet (50 mg total) by mouth 2 (two) times daily., Disp: 180 tablet, Rfl: 1 .  Multiple Vitamin (MULTIVITAMIN WITH MINERALS) TABS tablet, Take 1 tablet by mouth at bedtime. , Disp: , Rfl:  .  nitroGLYCERIN (NITROSTAT) 0.4 MG SL tablet, Place  1 tablet (0.4 mg total) under the tongue every 5 (five) minutes as needed for chest pain., Disp: 90 tablet, Rfl: 0 .  ondansetron (ZOFRAN ODT) 8 MG disintegrating tablet, Take 1 tablet (8 mg total) by mouth every 8 (eight) hours as needed for nausea or vomiting., Disp: 30 tablet, Rfl: 0 .  ondansetron (ZOFRAN) 8 MG tablet, Take by mouth., Disp: , Rfl:  .  Probiotic Product (RA PROBIOTIC GUMMIES) CHEW, Chew 1 each by mouth at bedtime. , Disp: , Rfl:  .  rosuvastatin (CRESTOR) 40 MG tablet, Take 1  tablet (40 mg total) by mouth daily., Disp: 90 tablet, Rfl: 1 .  Trastuzumab (HERCEPTIN IV), Inject into the vein., Disp: , Rfl:   Allergies  Allergen Reactions  . Atorvastatin Other (See Comments)    Myalgia   . Iohexol Itching, Rash and Other (See Comments)    Desc: White blisters in mouth during ivp in [facility] '93, ok w/ 13 hour prep today//a.[facility], Onset Date: 10/30/1991   . Ace Inhibitors Cough  . 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 Penicillins Itching and Rash    Has patient had a PCN reaction causing immediate rash, facial/tongue/throat swelling, SOB or lightheadedness with hypotension: Yes Has patient had a PCN reaction causing severe rash involving mucus membranes or skin necrosis: No Has patient had a PCN reaction that required hospitalization: No Has patient had a PCN reaction occurring within the last 10 years: No If all of the above answers are "NO", then may proceed with Cephalosporin use.  . Sulfa Antibiotics Itching and Rash   Past Medical History:  Diagnosis Date  . Anxiety   . Asthma   . DDD (degenerative disc disease)   . GERD (gastroesophageal reflux disease)   . Hemorrhoids   . HIT (heparin-induced thrombocytopenia) ([place])   . Hyperlipemia   . Hypertension   . Infiltrating ductal carcinoma of left breast ([place]) 12/15/2017  . Iron deficiency anemia 11/23/2015  . Pulmonary emboli ([place]) 12/07/2015  . TMJ (temporomandibular joint disorder)   . Type 2 diabetes mellitus ([place]) 12/15/2017    Observations/Objective: A&O  No respiratory distress or wheezing audible over the phone Mood, judgement, and thought processes all WNL  Assessment and Plan: 1. 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 Discussed symptom management. If COVID-19 positive she will stop Cefdinir.  - cefdinir (OMNICEF) 300 MG capsule; Take 1 capsule (300 mg total) by mouth 2  (two) times daily for 10 days. 1 po BID  Dispense: 20 capsule; Refill: 0 - predniSONE (STERAPRED UNI-PAK 21 TAB) 10 MG (21) TBPK tablet; As directed x 6 days  Dispense: 21 tablet; Refill: 0   Follow Up Instructions:  I discussed the assessment and treatment plan with the patient. The patient was provided an opportunity to ask questions and all were answered. The patient agreed with the plan and demonstrated an understanding of the instructions.   The patient was advised to call back or seek an in-person evaluation if the symptoms worsen or if the condition fails to improve as anticipated.  The above assessment and management plan was discussed with the patient. The patient verbalized understanding of and has agreed to the management plan. Patient is aware to call the clinic if symptoms persist or worsen. Patient is aware when to return to the clinic for a follow-up visit. Patient educated on when it is appropriate to go  to the emergency department.   Time call ended: 12:00 PM  I provided 11 minutes of non-face-to-face time during this encounter.  Hendricks Limes, MSN, APRN, [name]-C [facility] 02/26/20

## 2020-04-01 ENCOUNTER — Other Ambulatory Visit: Payer: Self-pay

## 2020-04-01 ENCOUNTER — Ambulatory Visit (INDEPENDENT_AMBULATORY_CARE_PROVIDER_SITE_OTHER): Payer: Managed Care, Other (non HMO) | Admitting: Nurse Practitioner

## 2020-04-01 ENCOUNTER — Ambulatory Visit (INDEPENDENT_AMBULATORY_CARE_PROVIDER_SITE_OTHER): Payer: Managed Care, Other (non HMO)

## 2020-04-01 ENCOUNTER — Encounter: Payer: Self-pay | Admitting: Nurse Practitioner

## 2020-04-01 VITALS — BP 135/90 | HR 72 | Temp 97.6°F | Resp 20 | Ht 68.0 in | Wt 212.0 lb

## 2020-04-01 DIAGNOSIS — N393 Stress incontinence (female) (male): Secondary | ICD-10-CM

## 2020-04-01 DIAGNOSIS — Z0001 Encounter for general adult medical examination with abnormal findings: Secondary | ICD-10-CM

## 2020-04-01 DIAGNOSIS — Z720 Tobacco use: Secondary | ICD-10-CM

## 2020-04-01 DIAGNOSIS — I10 Essential (primary) hypertension: Secondary | ICD-10-CM

## 2020-04-01 DIAGNOSIS — F3341 Major depressive disorder, recurrent, in partial remission: Secondary | ICD-10-CM

## 2020-04-01 DIAGNOSIS — E785 Hyperlipidemia, unspecified: Secondary | ICD-10-CM | POA: Diagnosis not present

## 2020-04-01 DIAGNOSIS — E669 Obesity, unspecified: Secondary | ICD-10-CM

## 2020-04-01 DIAGNOSIS — F411 Generalized anxiety disorder: Secondary | ICD-10-CM

## 2020-04-01 DIAGNOSIS — D509 Iron deficiency anemia, unspecified: Secondary | ICD-10-CM

## 2020-04-01 DIAGNOSIS — E1140 Type 2 diabetes mellitus with diabetic neuropathy, unspecified: Secondary | ICD-10-CM

## 2020-04-01 DIAGNOSIS — K219 Gastro-esophageal reflux disease without esophagitis: Secondary | ICD-10-CM

## 2020-04-01 DIAGNOSIS — Z0000 Encounter for general adult medical examination without abnormal findings: Secondary | ICD-10-CM

## 2020-04-01 DIAGNOSIS — E1165 Type 2 diabetes mellitus with hyperglycemia: Secondary | ICD-10-CM

## 2020-04-01 DIAGNOSIS — J410 Simple chronic bronchitis: Secondary | ICD-10-CM

## 2020-04-01 DIAGNOSIS — I25119 Atherosclerotic heart disease of native coronary artery with unspecified angina pectoris: Secondary | ICD-10-CM

## 2020-04-01 LAB — BAYER DCA HB A1C WAIVED: HB A1C (BAYER DCA - WAIVED): 9.3 % — ABNORMAL HIGH (ref <7.0)

## 2020-04-01 MED ORDER — FLUOXETINE HCL 40 MG PO CAPS: ORAL_CAPSULE | ORAL | 1 refills | Status: DC

## 2020-04-01 MED ORDER — ESOMEPRAZOLE MAGNESIUM 40 MG PO CPDR: 40.0000 mg | DELAYED_RELEASE_CAPSULE | Freq: Every day | ORAL | 1 refills | Status: DC

## 2020-04-01 MED ORDER — METFORMIN HCL 1000 MG PO TABS: 1000.0000 mg | ORAL_TABLET | Freq: Two times a day (BID) | ORAL | 1 refills | Status: DC

## 2020-04-01 MED ORDER — CLONAZEPAM 0.5 MG PO TABS: 0.5000 mg | ORAL_TABLET | Freq: Two times a day (BID) | ORAL | 2 refills | Status: DC | PRN

## 2020-04-01 MED ORDER — GABAPENTIN 100 MG PO CAPS: ORAL_CAPSULE | ORAL | 1 refills | Status: DC

## 2020-04-01 MED ORDER — BLOOD GLUCOSE METER KIT: PACK | 0 refills | Status: DC

## 2020-04-01 MED ORDER — CLONAZEPAM 0.5 MG PO TABS: 0.5000 mg | ORAL_TABLET | Freq: Two times a day (BID) | ORAL | 1 refills | Status: DC | PRN

## 2020-04-01 MED ORDER — METOPROLOL TARTRATE 50 MG PO TABS: 50.0000 mg | ORAL_TABLET | Freq: Two times a day (BID) | ORAL | 1 refills | Status: DC

## 2020-04-01 NOTE — Patient Instructions (Signed)
Carbohydrate Counting for Diabetes Mellitus, Adult  Carbohydrate counting is a method of keeping track of how many carbohydrates you eat. Eating carbohydrates naturally increases the amount of sugar (glucose) in the blood. Counting how many carbohydrates you eat helps keep your blood glucose within normal limits, which helps you manage your diabetes (diabetes mellitus). It is important to know how many carbohydrates you can safely have in each meal. This is different for every person. A diet and nutrition specialist (registered dietitian) can help you make a meal plan and calculate how many carbohydrates you should have at each meal and snack. Carbohydrates are found in the following foods:  Grains, such as breads and cereals.  Dried beans and soy products.  Starchy vegetables, such as potatoes, peas, and corn.  Fruit and fruit juices.  Milk and yogurt.  Sweets and snack foods, such as cake, cookies, candy, chips, and soft drinks. How do I count carbohydrates? There are two ways to count carbohydrates in food. You can use either of the methods or a combination of both. Reading "Nutrition Facts" on packaged food The "Nutrition Facts" list is included on the labels of almost all packaged foods and beverages in the [place] It includes:  The serving size.  Information about nutrients in each serving, including the grams (g) of carbohydrate per serving. To use the "Nutrition Facts":  Decide how many servings you will have.  Multiply the number of servings by the number of carbohydrates per serving.  The resulting number is the total amount of carbohydrates that you will be having. Learning standard serving sizes of other foods When you eat carbohydrate foods that are not packaged or do not include "Nutrition Facts" on the label, you need to measure the servings in order to count the amount of carbohydrates:  Measure the foods that you will eat with a food scale or measuring cup, if  needed.  Decide how many standard-size servings you will eat.  Multiply the number of servings by 15. Most carbohydrate-rich foods have about 15 g of carbohydrates per serving. ? For example, if you eat 8 oz (170 g) of strawberries, you will have eaten 2 servings and 30 g of carbohydrates (2 servings x 15 g = 30 g).  For foods that have more than one food mixed, such as soups and casseroles, you must count the carbohydrates in each food that is included. The following list contains standard serving sizes of common carbohydrate-rich foods. Each of these servings has about 15 g of carbohydrates:   hamburger bun or  English muffin.   oz (15 mL) syrup.   oz (14 g) jelly.  1 slice of bread.  1 six-inch tortilla.  3 oz (85 g) cooked rice or pasta.  4 oz (113 g) cooked dried beans.  4 oz (113 g) starchy vegetable, such as peas, corn, or potatoes.  4 oz (113 g) hot cereal.  4 oz (113 g) mashed potatoes or  of a large baked potato.  4 oz (113 g) canned or frozen fruit.  4 oz (120 mL) fruit juice.  4-6 crackers.  6 chicken nuggets.  6 oz (170 g) unsweetened dry cereal.  6 oz (170 g) plain fat-free yogurt or yogurt sweetened with artificial sweeteners.  8 oz (240 mL) milk.  8 oz (170 g) fresh fruit or one small piece of fruit.  24 oz (680 g) popped popcorn. Example of carbohydrate counting Sample meal  3 oz (85 g) chicken breast.  6 oz (170 g)   brown rice.  4 oz (113 g) corn.  8 oz (240 mL) milk.  8 oz (170 g) strawberries with sugar-free whipped topping. Carbohydrate calculation 1. Identify the foods that contain carbohydrates: ? Rice. ? Corn. ? Milk. ? Strawberries. 2. Calculate how many servings you have of each food: ? 2 servings rice. ? 1 serving corn. ? 1 serving milk. ? 1 serving strawberries. 3. Multiply each number of servings by 15 g: ? 2 servings rice x 15 g = 30 g. ? 1 serving corn x 15 g = 15 g. ? 1 serving milk x 15 g = 15 g. ? 1  serving strawberries x 15 g = 15 g. 4. Add together all of the amounts to find the total grams of carbohydrates eaten: ? 30 g + 15 g + 15 g + 15 g = 75 g of carbohydrates total. Summary  Carbohydrate counting is a method of keeping track of how many carbohydrates you eat.  Eating carbohydrates naturally increases the amount of sugar (glucose) in the blood.  Counting how many carbohydrates you eat helps keep your blood glucose within normal limits, which helps you manage your diabetes.  A diet and nutrition specialist (registered dietitian) can help you make a meal plan and calculate how many carbohydrates you should have at each meal and snack. This information is not intended to replace advice given to you by your health care provider. Make sure you discuss any questions you have with your health care provider. Document Revised: 04/27/2017 Document Reviewed: 03/16/2016 Elsevier Patient Education  2020 [employer]  

## 2020-04-01 NOTE — Addendum Note (Signed)
Addended by: Rolena Infante on: 04/01/2020 10:21 AM   Modules accepted: Orders

## 2020-04-01 NOTE — Progress Notes (Addendum)
Subjective:    Patient ID: Brenda Cruz, female    DOB: 03-24-1966, 54 y.o.   MRN: [id number]   Chief Complaint: Annual Exam    HPI:  1. Annual physical exam No PAP  2. Essential hypertension No c/o chest pain, headache. She has had sob on exertion since her CABG surgery. BP Readings from Last 3 Encounters:  07/02/19 (!) 142/80  06/19/19 136/78  10/04/18 (!) 129/93     3. Type 2 diabetes mellitus with hyperglycemia, without long-term current use of insulin (HCC) Fasting blood sugars have improved but she does not keep up with it as much as she should. Lab Results  Component Value Date   HGBA1C 7.5 (H) 07/20/2018    4. Hyperlipidemia, unspecified hyperlipidemia type Does not watch diet and does very little exercise. Lab Results  Component Value Date   CHOL 147 08/29/2017   HDL 35 (L) 08/29/2017   LDLCALC 78 08/29/2017   TRIG 170 (H) 08/29/2017   CHOLHDL 4.2 08/29/2017     5. Coronary artery disease involving native coronary artery of native heart with angina pectoris [facility]) She had a cabg several years ago. She last saw cardiology on 07/02/19. According to office note, no changes were made to plan of care. She is to follow up yearly.  6. COPD exacerbation ([place]) Has occasional sob but is on no inhalers. She says has not gotten worse.  7. Tobacco abuse Over a pack a day and has no desire to stop.  8. Gastroesophageal reflux disease without esophagitis Takes nexium daily and works well to keep symptoms under control.  9. Recurrent major depressive disorder, in partial remission ([place]) Is on prozac 40m daily and that seems to work the best for her. [facility] 04/01/2020 11/21/2019 10/04/2018  Decreased Interest 1 1 0  Down, Depressed, Hopeless 1 1 0  PHQ - 2 Score 2 2 0  Altered sleeping 1 0 -  Tired, decreased energy 1 1 -  Change in appetite 1 0 -  Feeling bad or failure about yourself  0 0 -  Trouble concentrating 1 1 -  Moving slowly or  fidgety/restless 0 0 -  Suicidal thoughts 0 0 -  PHQ-9 Score 6 4 -  Difficult doing work/chores Not difficult at all Somewhat difficult -  Some recent data might be hidden     10. GAD (generalized anxiety disorder) Is on ativan BID. She is under a lot of stress and has needed to take more. GAD 7 : Generalized Anxiety Score 04/01/2020 11/21/2019  Nervous, Anxious, on Edge 3 2  Control/stop worrying 3 2  Worry too much - different things 3 2  Trouble relaxing 1 1  Restless 0 0  Easily annoyed or irritable 1 0  Afraid - awful might happen 1 1  Total GAD 7 Score 12 8  Anxiety Difficulty Not difficult at all Somewhat difficult      11. Iron deficiency anemia, unspecified iron deficiency anemia type Always feels fatigued Lab Results  Component Value Date   HGB 12.7 08/29/2017     12. Stress incontinence of urine Is some better  13. Obesity with body mass index of 30.0-39.9 Weight is down 18 lbs since last visit Wt Readings from Last 3 Encounters:  04/01/20 212 lb (96.2 kg)  07/02/19 230 lb (104.3 kg)  06/28/19 229 lb (103.9 kg)   BMI Readings from Last 3 Encounters:  04/01/20 32.23 kg/m  07/02/19 34.97 kg/m  06/28/19 34.82 kg/m  Outpatient Encounter Medications as of 04/01/2020  Medication Sig  . albuterol (VENTOLIN HFA) 108 (90 Base) MCG/ACT inhaler Inhale 2 puffs into the lungs every 4 (four) hours as needed for wheezing or shortness of breath.  Marland Kitchen aspirin EC 81 MG tablet Take 81 mg by mouth every evening.   . blood glucose meter kit and supplies Dispense based on patient and insurance preference. Use up to four times daily as directed. (FOR ICD-10 E10.9, E11.9).  Marland Kitchen diphenhydrAMINE (BENADRYL) 25 MG tablet Take 25 mg by mouth every 6 (six) hours as needed for itching or allergies.  Marland Kitchen esomeprazole (NEXIUM) 40 MG capsule Take 1 capsule (40 mg total) by mouth daily.  Marland Kitchen FLUoxetine (PROZAC) 40 MG capsule TAKE ONE (1) CAPSULE EACH DAY  . gabapentin (NEURONTIN) 100  MG capsule TAKE 1 TO 3 CAPSULES AT BEDTIME  . ipratropium-albuterol (DUONEB) 0.5-2.5 (3) MG/3ML SOLN Take 3 mLs by nebulization every 6 (six) hours as needed for Wheezing.  Marland Kitchen LORazepam (ATIVAN) 1 MG tablet TAKE ONE TABLET BY MOUTH TWICE DAILY  . Probiotic Product (RA PROBIOTIC GUMMIES) CHEW Chew 1 each by mouth at bedtime.   . [DISCONTINUED] isosorbide mononitrate (IMDUR) 30 MG 24 hr tablet Take 0.5 tablets (15 mg total) by mouth daily.  . magnesium oxide (MAG-OX) 400 MG tablet Take by mouth.  . metFORMIN (GLUCOPHAGE) 1000 MG tablet Take 1 tablet (1,000 mg total) by mouth 2 (two) times daily with a meal.  . metoprolol tartrate (LOPRESSOR) 50 MG tablet Take 1 tablet (50 mg total) by mouth 2 (two) times daily.  . Multiple Vitamin (MULTIVITAMIN WITH MINERALS) TABS tablet Take 1 tablet by mouth at bedtime.   . nitroGLYCERIN (NITROSTAT) 0.4 MG SL tablet Place 1 tablet (0.4 mg total) under the tongue every 5 (five) minutes as needed for chest pain.  Marland Kitchen ondansetron (ZOFRAN ODT) 8 MG disintegrating tablet Take 1 tablet (8 mg total) by mouth every 8 (eight) hours as needed for nausea or vomiting.     Past Surgical History:  Procedure Laterality Date  . CARDIAC CATHETERIZATION N/A 10/29/2015   Procedure: Left Heart Cath and Coronary Angiography;  Surgeon: [name], MD;  Location: [place] [facility];  Service: Cardiovascular;  Laterality: N/A;  . CHOLECYSTECTOMY    . CORONARY ARTERY BYPASS GRAFT N/A 11/02/2015   Procedure: CORONARY ARTERY BYPASS GRAFTING (CABG)x 1 using left internal mammary artery.;  Surgeon: Melrose Nakayama, MD;  Location: [place];  Service: Open Heart Surgery;  Laterality: N/A;  . LEFT OOPHORECTOMY Left    Related to tubal torsion.  Bilateral salpingectomy also.  Remaining uterus and right ovary  . LUMBAR FUSION    . TEE WITHOUT CARDIOVERSION N/A 11/02/2015   Procedure: TRANSESOPHAGEAL ECHOCARDIOGRAM (TEE);  Surgeon: Melrose Nakayama, MD;  Location: [place];  Service: Open  Heart Surgery;  Laterality: N/A;    Family History  Problem Relation Age of Onset  . Colon polyps Father   . Stomach cancer Paternal Grandmother   . Esophageal cancer Maternal Grandfather   . Breast cancer Mother   . Ovarian cancer Other        Maternal Side Great  Aunt  . Colon cancer Neg Hx     New complaints: None today  Social history: Lives with her husband and son  Controlled substance contract: 04/01/20    Review of Systems  Constitutional: Negative for diaphoresis.  Eyes: Negative for pain.  Respiratory: Negative for shortness of breath.   Cardiovascular: Negative for chest pain,  palpitations and leg swelling.  Gastrointestinal: Negative for abdominal pain.  Endocrine: Negative for polydipsia.  Skin: Negative for rash.  Neurological: Negative for dizziness, weakness and headaches.  Hematological: Does not bruise/bleed easily.  All other systems reviewed and are negative.      Objective:   Physical Exam Vitals and nursing note reviewed.  Constitutional:      General: She is not in acute distress.    Appearance: Normal appearance. She is well-developed.  HENT:     Head: Normocephalic.     Nose: Nose normal.  Eyes:     Pupils: Pupils are equal, round, and reactive to light.  Neck:     Vascular: No carotid bruit or JVD.  Cardiovascular:     Rate and Rhythm: Normal rate and regular rhythm.     Heart sounds: Normal heart sounds.  Pulmonary:     Effort: Pulmonary effort is normal. No respiratory distress.     Breath sounds: Normal breath sounds. No wheezing or rales.  Chest:     Chest wall: No tenderness.  Abdominal:     General: Bowel sounds are normal. There is no distension or abdominal bruit.     Palpations: Abdomen is soft. There is no hepatomegaly, splenomegaly, mass or pulsatile mass.     Tenderness: There is no abdominal tenderness.  Musculoskeletal:        General: Normal range of motion.     Cervical back: Normal range of motion and neck  supple.  Lymphadenopathy:     Cervical: No cervical adenopathy.  Skin:    General: Skin is warm and dry.  Neurological:     Mental Status: She is alert and oriented to person, place, and time.     Deep Tendon Reflexes: Reflexes are normal and symmetric.  Psychiatric:        Behavior: Behavior normal.        Thought Content: Thought content normal.        Judgment: Judgment normal.     BP 135/90   Pulse 72   Temp 97.6 F (36.4 C) (Temporal)   Resp 20   Ht 5' 8"  (1.727 m)   Wt 212 lb (96.2 kg)   SpO2 98%   BMI 32.23 kg/m   hgba1c 9.3     Assessment & Plan:  DESSIRAE SCAROLA comes in today with chief complaint of Annual Exam   Diagnosis and orders addressed:  1. Annual physical exam No pap  2. Essential hypertension Low sodium diet - CBC with Differential/Platelet - CMP14+EGFR - metoprolol tartrate (LOPRESSOR) 50 MG tablet; Take 1 tablet (50 mg total) by mouth 2 (two) times daily.  Dispense: 180 tablet; Refill: 1  3. Type 2 diabetes mellitus with hyperglycemia, without long-term current use of insulin (HCC) Continue to watch carbs in diet Added sample of rybelsus Keep diary of fasting blood sugars - Bayer DCA Hb A1c Waived - Microalbumin / creatinine urine ratio - Magnesium - metFORMIN (GLUCOPHAGE) 1000 MG tablet; Take 1 tablet (1,000 mg total) by mouth 2 (two) times daily with a meal.  Dispense: 180 tablet; Refill: 1  4. Hyperlipidemia, unspecified hyperlipidemia type Low fta diet - Lipid panel  5. Coronary artery disease involving native coronary artery of native heart with angina pectoris [facility]) Keep follow up with cardiology  6. Simple chronic bronchitis ([place]) - DG Chest 2 View  7. Tobacco abuse Smoking cessation encouraged  8. Gastroesophageal reflux disease without esophagitis Avoid spicy foods Do not eat 2 hours prior to  bedtime - esomeprazole (NEXIUM) 40 MG capsule; Take 1 capsule (40 mg total) by mouth daily.  Dispense: 90 capsule; Refill:  1  9. Recurrent major depressive disorder, in partial remission (HCC) Stress management - FLUoxetine (PROZAC) 40 MG capsule; TAKE ONE (1) CAPSULE EACH DAY  Dispense: 90 capsule; Refill: 1  10. GAD (generalized anxiety disorder) chnaged from ativan to klonopin - clonazePAM (KLONOPIN) 0.5 MG tablet; Take 1 tablet (0.5 mg total) by mouth 2 (two) times daily as needed for anxiety.  Dispense: 20 tablet; Refill: 1  11. Iron deficiency anemia, unspecified iron deficiency anemia type Labs pending  12. Stress incontinence of urine  13. Obesity with body mass index of 30.0-39.9 Discussed diet and exercise for person with BMI >25 Will recheck weight in 3-6 months   14. Type 2 diabetes mellitus with diabetic neuropathy, without long-term current use of insulin (HCC) Do not go barefooted - gabapentin (NEURONTIN) 100 MG capsule; TAKE 1 TO 3 CAPSULES AT BEDTIME  Dispense: 90 capsule; Refill: 1   Labs pending Health Maintenance reviewed Diet and exercise encouraged  Follow up plan: 1 month   [place], [name]

## 2020-04-02 LAB — CBC WITH DIFFERENTIAL/PLATELET
Basophils Absolute: 0.1 10*3/uL (ref 0.0–0.2)
Basos: 1 %
EOS (ABSOLUTE): 0.2 10*3/uL (ref 0.0–0.4)
Eos: 2 %
Hematocrit: 41.1 % (ref 34.0–46.6)
Hemoglobin: 13.5 g/dL (ref 11.1–15.9)
Immature Grans (Abs): 0.1 10*3/uL (ref 0.0–0.1)
Immature Granulocytes: 1 %
Lymphocytes Absolute: 2.1 10*3/uL (ref 0.7–3.1)
Lymphs: 22 %
MCH: 31.2 pg (ref 26.6–33.0)
MCHC: 32.8 g/dL (ref 31.5–35.7)
MCV: 95 fL (ref 79–97)
Monocytes Absolute: 0.7 10*3/uL (ref 0.1–0.9)
Monocytes: 7 %
Neutrophils Absolute: 6.5 10*3/uL (ref 1.4–7.0)
Neutrophils: 67 %
Platelets: 207 10*3/uL (ref 150–450)
RBC: 4.33 x10E6/uL (ref 3.77–5.28)
RDW: 12 % (ref 11.7–15.4)
WBC: 9.5 10*3/uL (ref 3.4–10.8)

## 2020-04-02 LAB — CMP14+EGFR
ALT: 136 IU/L — ABNORMAL HIGH (ref 0–32)
AST: 128 IU/L — ABNORMAL HIGH (ref 0–40)
Albumin/Globulin Ratio: 1.7 (ref 1.2–2.2)
Albumin: 4.3 g/dL (ref 3.8–4.9)
Alkaline Phosphatase: 103 IU/L (ref 48–121)
BUN/Creatinine Ratio: 10 (ref 9–23)
BUN: 11 mg/dL (ref 6–24)
Bilirubin Total: 0.3 mg/dL (ref 0.0–1.2)
CO2: 23 mmol/L (ref 20–29)
Calcium: 9.6 mg/dL (ref 8.7–10.2)
Chloride: 100 mmol/L (ref 96–106)
Creatinine, Ser: 1.1 mg/dL — ABNORMAL HIGH (ref 0.57–1.00)
GFR calc Af Amer: 67 mL/min/{1.73_m2} (ref >59)
GFR calc non Af Amer: 58 mL/min/{1.73_m2} — ABNORMAL LOW (ref >59)
Globulin, Total: 2.5 g/dL (ref 1.5–4.5)
Glucose: 206 mg/dL — ABNORMAL HIGH (ref 65–99)
Potassium: 4.5 mmol/L (ref 3.5–5.2)
Sodium: 140 mmol/L (ref 134–144)
Total Protein: 6.8 g/dL (ref 6.0–8.5)

## 2020-04-02 LAB — LIPID PANEL
Chol/HDL Ratio: 10 ratio — ABNORMAL HIGH (ref 0.0–4.4)
Cholesterol, Total: 310 mg/dL — ABNORMAL HIGH (ref 100–199)
HDL: 31 mg/dL — ABNORMAL LOW (ref >39)
LDL Chol Calc (NIH): 212 mg/dL — ABNORMAL HIGH (ref 0–99)
Triglycerides: 324 mg/dL — ABNORMAL HIGH (ref 0–149)
VLDL Cholesterol Cal: 67 mg/dL — ABNORMAL HIGH (ref 5–40)

## 2020-04-02 LAB — MAGNESIUM: Magnesium: 1.3 mg/dL — ABNORMAL LOW (ref 1.6–2.3)

## 2020-04-02 MED ORDER — MAGNESIUM 30 MG PO TABS
30.0000 mg | ORAL_TABLET | Freq: Two times a day (BID) | ORAL | 1 refills | Status: DC
Start: 2020-04-02 — End: 2020-11-05

## 2020-04-02 NOTE — Addendum Note (Signed)
Addended by: [name] on: 04/02/2020 11:42 AM   Modules accepted: Orders

## 2020-04-24 ENCOUNTER — Other Ambulatory Visit: Payer: Self-pay

## 2020-04-24 ENCOUNTER — Encounter: Payer: Self-pay | Admitting: Nurse Practitioner

## 2020-04-24 ENCOUNTER — Ambulatory Visit: Payer: Managed Care, Other (non HMO) | Admitting: Nurse Practitioner

## 2020-04-24 VITALS — BP 112/81 | HR 83 | Temp 97.6°F | Resp 20 | Ht 68.0 in | Wt 204.0 lb

## 2020-04-24 DIAGNOSIS — E1165 Type 2 diabetes mellitus with hyperglycemia: Secondary | ICD-10-CM

## 2020-04-24 LAB — BAYER DCA HB A1C WAIVED: HB A1C (BAYER DCA - WAIVED): 8.2 % — ABNORMAL HIGH (ref <7.0)

## 2020-04-24 MED ORDER — DAPAGLIFLOZIN PROPANEDIOL 10 MG PO TABS: 10.0000 mg | ORAL_TABLET | Freq: Every day | ORAL | 0 refills | Status: DC

## 2020-04-24 NOTE — Progress Notes (Signed)
Subjective:    Patient ID: Brenda Cruz, female    DOB: 04-Jul-1966, 54 y.o.   MRN: [id number]   Chief Complaint: Diabetes   HPI Reports complete lack of appetite, especially meat, which is abnormal for her. Reports nausea and headaches. Reports a history of hypomagnesemia and is feeling her typical symptoms with one of those episodes. Checks fasting CBGs daily, 120-140 is a typical day. Loss of weight and muscle mass was noted since the last visit, possibly related to the lack of appetite and fatigue.     Review of Systems  Constitutional: Negative.   HENT: Negative.   Eyes: Negative.   Respiratory: Negative.   Cardiovascular: Negative.   Gastrointestinal: Negative.   Endocrine: Negative.   Genitourinary: Negative.   Musculoskeletal: Negative.   Skin: Negative.   Allergic/Immunologic: Negative.   Neurological: Negative.   Hematological: Negative.   Psychiatric/Behavioral: Negative.        Objective:   Physical Exam Vitals and nursing note reviewed.  Constitutional:      Appearance: Normal appearance. She is normal weight.  HENT:     Head: Normocephalic and atraumatic.     Right Ear: Tympanic membrane, ear canal and external ear normal.     Left Ear: Tympanic membrane, ear canal and external ear normal.     Nose: Nose normal.     Mouth/Throat:     Mouth: Mucous membranes are moist.     Pharynx: Oropharynx is clear.  Eyes:     Extraocular Movements: Extraocular movements intact.     Conjunctiva/sclera: Conjunctivae normal.     Pupils: Pupils are equal, round, and reactive to light.  Cardiovascular:     Rate and Rhythm: Normal rate and regular rhythm.     Pulses: Normal pulses.     Heart sounds: Normal heart sounds.  Pulmonary:     Effort: Pulmonary effort is normal.     Breath sounds: Normal breath sounds.  Abdominal:     General: Abdomen is flat. Bowel sounds are normal.     Palpations: Abdomen is soft.     Tenderness: There is abdominal tenderness.  There is guarding.  Genitourinary:    General: Normal vulva.     Rectum: Normal.  Musculoskeletal:        General: Normal range of motion.     Cervical back: Normal range of motion and neck supple.  Skin:    General: Skin is warm and dry.     Capillary Refill: Capillary refill takes less than 2 seconds.  Neurological:     General: No focal deficit present.     Mental Status: She is alert and oriented to person, place, and time. Mental status is at baseline.  Psychiatric:        Mood and Affect: Mood normal.        Behavior: Behavior normal.        Thought Content: Thought content normal.        Judgment: Judgment normal.           Assessment & Plan:  Brenda Cruz comes in today with chief complaint of Diabetes   Diagnosis and orders addressed:  1. Type 2 diabetes mellitus with hyperglycemia, without long-term current use of insulin ([place]) Patient encouraged to comply with a carb-modified diet and continue checking fasting CBGs daily. Patient encouraged to maintain new treatment regimen and note feelings of hypoglycemia. Log CBGs daily. Return in 3 months for further evaluation.  - Bayer DCA Hb A1c Waived  Stop rybellsus Added faexiga 10mg  daily- samples given Keep diary of blood sugars   Follow up plan: Follow-up in 3 months   [name] [name], [place], [place] Student

## 2020-05-01 ENCOUNTER — Telehealth: Payer: Self-pay | Admitting: Nurse Practitioner

## 2020-05-01 MED ORDER — FLUCONAZOLE 150 MG PO TABS
150.0000 mg | ORAL_TABLET | ORAL | 0 refills | Status: DC | PRN
Start: 2020-05-01 — End: 2020-10-13

## 2020-05-01 NOTE — Telephone Encounter (Signed)
Pt aware.

## 2020-05-01 NOTE — Telephone Encounter (Signed)
Diflucan Prescription sent to pharmacy   

## 2020-05-01 NOTE — Telephone Encounter (Signed)
Patient was started on farxiga a few days ago by MMM.  She has since developed muscle cramps, heart palpitations, symptoms of a yeast infection (vaginal itching, discomfort).  She has stopped the medication and we made her an appointment to follow up with MMM to discuss the next option.  She is requesting we send in Diflucan to [facility] in [place].  Please advise.

## 2020-05-06 ENCOUNTER — Telehealth: Payer: Self-pay | Admitting: Nurse Practitioner

## 2020-05-06 NOTE — Telephone Encounter (Signed)
We changed her to [place] at last visit because the rybellus made her sick. Does she mean farxiga

## 2020-05-06 NOTE — Telephone Encounter (Signed)
Not on med list please advise 

## 2020-05-07 NOTE — Telephone Encounter (Signed)
Pt returned missed call stating that she stopped taking [place] because she had a bad reaction to it so she started taking Rybellus. Pt says the Rybellus does make her a little nauseus at times but says she does much better with it than with the [place] so she would like to continue taking the Rybellus. Pt says she has not taken it in the past 5 days because she ran out. She has an appt scheduled on 7/26 but wants to know if she needs to keep that appt or if its ok to continue with taking the Rybellus and wait to come in at the other visit she has scheduled on 05/25/20.

## 2020-05-07 NOTE — Telephone Encounter (Signed)
LM for pt to call back for clarification on meds - jhb 7/22

## 2020-05-10 NOTE — Telephone Encounter (Signed)
Ok to give rybellus samples

## 2020-05-11 ENCOUNTER — Ambulatory Visit: Payer: Managed Care, Other (non HMO) | Admitting: Nurse Practitioner

## 2020-05-12 NOTE — Telephone Encounter (Signed)
Sample put up front for Rybelsus 3mg  daily [id number], EXP 3/23 Counseling provided on medication

## 2020-05-13 NOTE — Telephone Encounter (Signed)
Pt aware up front

## 2020-05-22 ENCOUNTER — Other Ambulatory Visit: Payer: Self-pay | Admitting: Nurse Practitioner

## 2020-05-25 ENCOUNTER — Ambulatory Visit: Payer: Managed Care, Other (non HMO) | Admitting: Nurse Practitioner

## 2020-05-25 ENCOUNTER — Ambulatory Visit: Payer: Self-pay | Admitting: Nurse Practitioner

## 2020-05-25 ENCOUNTER — Other Ambulatory Visit: Payer: Self-pay

## 2020-05-25 ENCOUNTER — Encounter: Payer: Self-pay | Admitting: Nurse Practitioner

## 2020-05-25 VITALS — BP 116/78 | HR 81 | Temp 98.2°F | Resp 20 | Ht 68.0 in | Wt 209.0 lb

## 2020-05-25 DIAGNOSIS — M5442 Lumbago with sciatica, left side: Secondary | ICD-10-CM | POA: Diagnosis not present

## 2020-05-25 MED ORDER — CYCLOBENZAPRINE HCL 10 MG PO TABS
10.0000 mg | ORAL_TABLET | Freq: Three times a day (TID) | ORAL | 1 refills | Status: DC | PRN
Start: 2020-05-25 — End: 2022-11-28

## 2020-05-25 MED ORDER — METHYLPREDNISOLONE ACETATE 80 MG/ML IJ SUSP
80.0000 mg | Freq: Once | INTRAMUSCULAR | Status: AC
  Administered 2020-05-25: 80 mg via INTRAMUSCULAR

## 2020-05-25 MED ORDER — HYDROCODONE-ACETAMINOPHEN 5-325 MG PO TABS: 1.0000 | ORAL_TABLET | Freq: Four times a day (QID) | ORAL | 0 refills | Status: DC | PRN

## 2020-05-25 NOTE — Progress Notes (Signed)
Subjective:    Patient ID: Brenda Cruz, female    DOB: 1966/02/09, 54 y.o.   MRN: [id number]   Chief Complaint: Back Pain   HPI Patient comes in today c/o of back pain. She said she is not sure what she did to it. But she woke up Saturday morning and could hardly move. She had some steroids at home and she has been taking 1 a day. They were 20mg  steroid tablets. She has used a TENS unit , hot and cold with no relief. She rates pain 8-9/10 if she moves. Sitting still around 6/10.  Pain radiates around left hip and down front of leg. She has had back surgery in the past.    Review of Systems  Constitutional: Negative for diaphoresis.  Eyes: Negative for pain.  Respiratory: Negative for shortness of breath.   Cardiovascular: Negative for chest pain, palpitations and leg swelling.  Gastrointestinal: Negative for abdominal pain.  Endocrine: Negative for polydipsia.  Skin: Negative for rash.  Neurological: Negative for dizziness, weakness and headaches.  Hematological: Does not bruise/bleed easily.  All other systems reviewed and are negative.      Objective:   Physical Exam Vitals and nursing note reviewed.  Constitutional:      Appearance: Normal appearance.  Cardiovascular:     Rate and Rhythm: Normal rate and regular rhythm.     Heart sounds: Normal heart sounds.  Pulmonary:     Breath sounds: Normal breath sounds.  Abdominal:     Palpations: Abdomen is soft.  Musculoskeletal:     Comments: Moving slowly Rises slowly from sitting to standing SLR (+) left at 45 degrees.  Skin:    General: Skin is warm.  Neurological:     General: No focal deficit present.     Mental Status: She is alert and oriented to person, place, and time.  Psychiatric:        Mood and Affect: Mood normal.        Behavior: Behavior normal.    BP 116/78    Pulse 81    Temp 98.2 F (36.8 C) (Temporal)    Resp 20    Ht 5\' 8"  (1.727 m)    Wt 209 lb (94.8 kg)    SpO2 99%    BMI 31.78 kg/m          Assessment & Plan:  Brenda Cruz in today with chief complaint of Back Pain   1. Acute midline low back pain with left-sided sciatica Moist heat Rest Meds ordered this encounter  Medications   methylPREDNISolone acetate (DEPO-MEDROL) injection 80 mg   cyclobenzaprine (FLEXERIL) 10 MG tablet    Sig: Take 1 tablet (10 mg total) by mouth 3 (three) times daily as needed for muscle spasms.    Dispense:  30 tablet    Refill:  1    Order Specific Question:   Supervising Provider    Answer:   Caryl Pina A [[id number]]   HYDROcodone-acetaminophen (NORCO/VICODIN) 5-325 MG tablet    Sig: Take 1 tablet by mouth every 6 (six) hours as needed for moderate pain.    Dispense:  30 tablet    Refill:  0    Order Specific Question:   Supervising Provider    Answer:   Caryl Pina A [[id number]]    - methylPREDNISolone acetate (DEPO-MEDROL) injection 80 mg    The above assessment and management plan was discussed with the patient. The patient verbalized understanding of and has agreed to the  management plan. Patient is aware to call the clinic if symptoms persist or worsen. Patient is aware when to return to the clinic for a follow-up visit. Patient educated on when it is appropriate to go to the emergency department.   [name] [name], [name]

## 2020-05-25 NOTE — Patient Instructions (Signed)
Acute Back Pain, Adult Acute back pain is sudden and usually short-lived. It is often caused by an injury to the muscles and tissues in the back. The injury may result from:  A muscle or ligament getting overstretched or torn (strained). Ligaments are tissues that connect bones to each other. Lifting something improperly can cause a back strain.  Wear and tear (degeneration) of the spinal disks. Spinal disks are circular tissue that provides cushioning between the bones of the spine (vertebrae).  Twisting motions, such as while playing sports or doing yard work.  A hit to the back.  Arthritis. You may have a physical exam, lab tests, and imaging tests to find the cause of your pain. Acute back pain usually goes away with rest and home care. Follow these instructions at home: Managing pain, stiffness, and swelling  Take over-the-counter and prescription medicines only as told by your health care provider.  Your health care provider may recommend applying ice during the first 24-48 hours after your pain starts. To do this: ? Put ice in a plastic bag. ? Place a towel between your skin and the bag. ? Leave the ice on for 20 minutes, 2-3 times a day.  If directed, apply heat to the affected area as often as told by your health care provider. Use the heat source that your health care provider recommends, such as a moist heat pack or a heating pad. ? Place a towel between your skin and the heat source. ? Leave the heat on for 20-30 minutes. ? Remove the heat if your skin turns bright red. This is especially important if you are unable to feel pain, heat, or cold. You have a greater risk of getting burned. Activity   Do not stay in bed. Staying in bed for more than 1-2 days can delay your recovery.  Sit up and stand up straight. Avoid leaning forward when you sit, or hunching over when you stand. ? If you work at a desk, sit close to it so you do not need to lean over. Keep your chin tucked  in. Keep your neck drawn back, and keep your elbows bent at a right angle. Your arms should look like the letter "L." ? Sit high and close to the steering wheel when you drive. Add lower back (lumbar) support to your car seat, if needed.  Take short walks on even surfaces as soon as you are able. Try to increase the length of time you walk each day.  Do not sit, drive, or stand in one place for more than 30 minutes at a time. Sitting or standing for long periods of time can put stress on your back.  Do not drive or use heavy machinery while taking prescription pain medicine.  Use proper lifting techniques. When you bend and lift, use positions that put less stress on your back: ? Bend your knees. ? Keep the load close to your body. ? Avoid twisting.  Exercise regularly as told by your health care provider. Exercising helps your back heal faster and helps prevent back injuries by keeping muscles strong and flexible.  Work with a physical therapist to make a safe exercise program, as recommended by your health care provider. Do any exercises as told by your physical therapist. Lifestyle  Maintain a healthy weight. Extra weight puts stress on your back and makes it difficult to have good posture.  Avoid activities or situations that make you feel anxious or stressed. Stress and anxiety increase muscle   tension and can make back pain worse. Learn ways to manage anxiety and stress, such as through exercise. General instructions  Sleep on a firm mattress in a comfortable position. Try lying on your side with your knees slightly bent. If you lie on your back, put a pillow under your knees.  Follow your treatment plan as told by your health care provider. This may include: ? Cognitive or behavioral therapy. ? Acupuncture or massage therapy. ? Meditation or yoga. Contact a health care provider if:  You have pain that is not relieved with rest or medicine.  You have increasing pain going down  into your legs or buttocks.  Your pain does not improve after 2 weeks.  You have pain at night.  You lose weight without trying.  You have a fever or chills. Get help right away if:  You develop new bowel or bladder control problems.  You have unusual weakness or numbness in your arms or legs.  You develop nausea or vomiting.  You develop abdominal pain.  You feel faint. Summary  Acute back pain is sudden and usually short-lived.  Use proper lifting techniques. When you bend and lift, use positions that put less stress on your back.  Take over-the-counter and prescription medicines and apply heat or ice as directed by your health care provider. This information is not intended to replace advice given to you by your health care provider. Make sure you discuss any questions you have with your health care provider. Document Revised: 01/22/2019 Document Reviewed: 05/17/2017 Elsevier Patient Education  2020 [employer]  

## 2020-05-26 ENCOUNTER — Ambulatory Visit: Payer: Managed Care, Other (non HMO) | Admitting: Nurse Practitioner

## 2020-06-11 ENCOUNTER — Telehealth: Payer: Self-pay | Admitting: Nurse Practitioner

## 2020-06-12 NOTE — Telephone Encounter (Signed)
YES SHE NEEDS TO GET COVID VACCINE

## 2020-06-12 NOTE — Telephone Encounter (Signed)
[email or web address]

## 2020-06-17 ENCOUNTER — Ambulatory Visit: Payer: Managed Care, Other (non HMO)

## 2020-06-23 ENCOUNTER — Telehealth: Payer: Self-pay | Admitting: Nurse Practitioner

## 2020-06-23 MED ORDER — RYBELSUS 7 MG PO TABS: 7.0000 mg | ORAL_TABLET | Freq: Every day | ORAL | 3 refills | Status: DC

## 2020-06-23 NOTE — Telephone Encounter (Signed)
Pt states she did well with the Semaglutide (RYBELSUS) 3 MG TABS And requesting a prescription. [facility] in [place].

## 2020-06-23 NOTE — Telephone Encounter (Signed)
Rybelsus called in (7mg  dose) Tolerating medication well Discussed with patient  She verbalizes understanding

## 2020-06-24 ENCOUNTER — Ambulatory Visit: Payer: Managed Care, Other (non HMO)

## 2020-06-25 ENCOUNTER — Ambulatory Visit: Payer: Self-pay | Admitting: Nurse Practitioner

## 2020-06-30 ENCOUNTER — Telehealth: Payer: Self-pay | Admitting: *Deleted

## 2020-06-30 NOTE — Telephone Encounter (Signed)
Started PA /Approved Message from Plan  This request has been approved using information available on the patient's profile. CaseId:[id number];Status:Approved;Review Type:Prior Auth;Coverage Start Date:05/31/2020;Coverage End Date:06/30/2023;for Rybelsus 7 mg.  [facility] aware

## 2020-07-01 ENCOUNTER — Ambulatory Visit: Payer: Managed Care, Other (non HMO)

## 2020-07-02 ENCOUNTER — Ambulatory Visit: Payer: Managed Care, Other (non HMO) | Admitting: Family Medicine

## 2020-07-02 ENCOUNTER — Other Ambulatory Visit: Payer: Self-pay

## 2020-07-02 ENCOUNTER — Ambulatory Visit (INDEPENDENT_AMBULATORY_CARE_PROVIDER_SITE_OTHER): Payer: Managed Care, Other (non HMO)

## 2020-07-02 ENCOUNTER — Encounter: Payer: Self-pay | Admitting: Family Medicine

## 2020-07-02 VITALS — BP 124/74 | HR 73 | Temp 98.6°F | Ht 68.0 in | Wt 205.0 lb

## 2020-07-02 DIAGNOSIS — R0781 Pleurodynia: Secondary | ICD-10-CM | POA: Diagnosis not present

## 2020-07-02 MED ORDER — VALACYCLOVIR HCL 1 G PO TABS
1000.0000 mg | ORAL_TABLET | Freq: Two times a day (BID) | ORAL | 0 refills | Status: DC
Start: 2020-07-02 — End: 2020-11-05

## 2020-07-02 NOTE — Progress Notes (Signed)
BP 124/74   Pulse 73   Temp 98.6 F (37 C)   Ht 5\' 8"  (1.727 m)   Wt 205 lb (93 kg)   SpO2 99%   BMI 31.17 kg/m    Subjective:   Patient ID: Brenda Cruz, female    DOB: 12/17/1965, 54 y.o.   MRN: [id number]  HPI: Brenda Cruz is a 54 y.o. female presenting on 07/02/2020 for Left sided pain   HPI Patient comes in with left-sided rib pain/chest wall pain that started about 6 days ago.  She denies any trauma or anything that would have hurt that.  She feels like it is bruised and it hurts just to touch it lightly.  She says it comes around on the bottom of her breast on that left side and around to her lateral ribs on that side but does not go into her back.  She denies any shortness of breath or wheezing or cough or congestion.  Relevant past medical, surgical, family and social history reviewed and updated as indicated. Interim medical history since our last visit reviewed. Allergies and medications reviewed and updated.  Review of Systems  Constitutional: Negative for chills and fever.  HENT: Negative for congestion, ear discharge and ear pain.   Eyes: Negative for redness and visual disturbance.  Respiratory: Negative for chest tightness and shortness of breath.   Cardiovascular: Positive for chest pain. Negative for palpitations and leg swelling.  Gastrointestinal: Negative for abdominal pain and diarrhea.  Genitourinary: Negative for difficulty urinating and dysuria.  Musculoskeletal: Negative for back pain and gait problem.  Skin: Negative for rash.  Neurological: Negative for light-headedness and headaches.  Psychiatric/Behavioral: Negative for agitation and behavioral problems.  All other systems reviewed and are negative.   Per HPI unless specifically indicated above      Objective:   BP 124/74   Pulse 73   Temp 98.6 F (37 C)   Ht 5\' 8"  (1.727 m)   Wt 205 lb (93 kg)   SpO2 99%   BMI 31.17 kg/m   Wt Readings from Last 3 Encounters:  07/02/20  205 lb (93 kg)  05/25/20 209 lb (94.8 kg)  04/24/20 204 lb (92.5 kg)    Physical Exam Vitals and nursing note reviewed.  Constitutional:      General: She is not in acute distress.    Appearance: She is well-developed. She is not diaphoretic.  Eyes:     Conjunctiva/sclera: Conjunctivae normal.  Cardiovascular:     Rate and Rhythm: Normal rate and regular rhythm.     Heart sounds: Normal heart sounds. No murmur heard.   Pulmonary:     Effort: Pulmonary effort is normal. No respiratory distress.     Breath sounds: Normal breath sounds. No wheezing.  Chest:     Chest wall: Tenderness: Pain on the left lower ribs and along the anterior left chest wall and pain on the lower breast as well on that side.  Musculoskeletal:        General: No tenderness. Normal range of motion.  Skin:    General: Skin is warm and dry.     Findings: No rash.  Neurological:     Mental Status: She is alert and oriented to person, place, and time.     Coordination: Coordination normal.  Psychiatric:        Behavior: Behavior normal.    Chest x-ray with left rib x-ray: No signs of acute abnormality.  Will give   Assessment &  Plan:   Problem List Items Addressed This Visit    None    Visit Diagnoses    Rib pain on left side    -  Primary   Relevant Orders   DG Ribs Unilateral W/Chest Left      Will give possible treatment for possible shingles based on symptoms Follow up plan: Return if symptoms worsen or fail to improve.  Counseling provided for all of the vaccine components Orders Placed This Encounter  Procedures  . DG Ribs Unilateral W/Chest Left    Caryl Pina, MD [facility] 07/02/2020, 3:33 PM

## 2020-07-30 ENCOUNTER — Other Ambulatory Visit (HOSPITAL_COMMUNITY): Payer: Self-pay

## 2020-07-31 ENCOUNTER — Telehealth: Payer: Self-pay | Admitting: Family Medicine

## 2020-07-31 ENCOUNTER — Other Ambulatory Visit: Payer: Self-pay | Admitting: Unknown Physician Specialty

## 2020-07-31 ENCOUNTER — Telehealth: Payer: Self-pay | Admitting: Unknown Physician Specialty

## 2020-07-31 DIAGNOSIS — J441 Chronic obstructive pulmonary disease with (acute) exacerbation: Secondary | ICD-10-CM

## 2020-07-31 DIAGNOSIS — U071 COVID-19: Secondary | ICD-10-CM

## 2020-07-31 DIAGNOSIS — I10 Essential (primary) hypertension: Secondary | ICD-10-CM

## 2020-07-31 DIAGNOSIS — E1165 Type 2 diabetes mellitus with hyperglycemia: Secondary | ICD-10-CM

## 2020-07-31 NOTE — Telephone Encounter (Signed)
I connected by phone with Brenda Cruz on 07/31/2020 at 10:39 AM to discuss the potential use of a new treatment for mild to moderate COVID-19 viral infection in non-hospitalized patients.  This patient is a 54 y.o. female that meets the FDA criteria for Emergency Use Authorization of COVID monoclonal antibody casirivimab/imdevimab or bamlanivimab/eteseviamb.  Has a (+) direct SARS-CoV-2 viral test result  Has mild or moderate COVID-19   Is NOT hospitalized due to COVID-19  Is within 10 days of symptom onset  Has at least one of the high risk factor(s) for progression to severe COVID-19 and/or hospitalization as defined in EUA.  Specific high risk criteria : BMI > 25, Diabetes, Cardiovascular disease or hypertension and Chronic Lung Disease   I have spoken and communicated the following to the patient or parent/caregiver regarding COVID monoclonal antibody treatment:  1. FDA has authorized the emergency use for the treatment of mild to moderate COVID-19 in adults and pediatric patients with positive results of direct SARS-CoV-2 viral testing who are 52 years of age and older weighing at least 40 kg, and who are at high risk for progressing to severe COVID-19 and/or hospitalization.  2. The significant known and potential risks and benefits of COVID monoclonal antibody, and the extent to which such potential risks and benefits are unknown.  3. Information on available alternative treatments and the risks and benefits of those alternatives, including clinical trials.  4. Patients treated with COVID monoclonal antibody should continue to self-isolate and use infection control measures (e.g., wear mask, isolate, social distance, avoid sharing personal items, clean and disinfect "high touch" surfaces, and frequent handwashing) according to CDC guidelines.   5. The patient or parent/caregiver has the option to accept or refuse COVID monoclonal antibody treatment.  After reviewing this  information with the patient, the patient has agreed to receive one of the available covid 19 monoclonal antibodies and will be provided an appropriate fact sheet prior to infusion. Kathrine Haddock, NP 07/31/2020 10:39 AM  Sx onset 10/11

## 2020-07-31 NOTE — Telephone Encounter (Signed)
FYI patient has COVID just wanted you to know.

## 2020-08-01 ENCOUNTER — Ambulatory Visit (HOSPITAL_COMMUNITY)
Admission: RE | Admit: 2020-08-01 | Discharge: 2020-08-01 | Disposition: A | Discharge status: Home / Self Care | Payer: Managed Care, Other (non HMO) | Source: Ambulatory Visit | Attending: Pulmonary Disease | Admitting: Pulmonary Disease

## 2020-08-01 DIAGNOSIS — U071 COVID-19: Secondary | ICD-10-CM | POA: Diagnosis present

## 2020-08-01 DIAGNOSIS — E1165 Type 2 diabetes mellitus with hyperglycemia: Secondary | ICD-10-CM | POA: Insufficient documentation

## 2020-08-01 DIAGNOSIS — I10 Essential (primary) hypertension: Secondary | ICD-10-CM | POA: Diagnosis present

## 2020-08-01 DIAGNOSIS — J441 Chronic obstructive pulmonary disease with (acute) exacerbation: Secondary | ICD-10-CM

## 2020-08-01 MED ORDER — ALBUTEROL SULFATE HFA 108 (90 BASE) MCG/ACT IN AERS: 2.0000 | INHALATION_SPRAY | Freq: Once | RESPIRATORY_TRACT | Status: DC | PRN

## 2020-08-01 MED ORDER — SODIUM CHLORIDE 0.9 % IV SOLN: INTRAVENOUS | Status: DC | PRN

## 2020-08-01 MED ORDER — EPINEPHRINE 0.3 MG/0.3ML IJ SOAJ: 0.3000 mg | Freq: Once | INTRAMUSCULAR | Status: DC | PRN

## 2020-08-01 MED ORDER — DIPHENHYDRAMINE HCL 50 MG/ML IJ SOLN: 50.0000 mg | Freq: Once | INTRAMUSCULAR | Status: DC | PRN

## 2020-08-01 MED ORDER — SODIUM CHLORIDE 0.9 % IV SOLN: Freq: Once | INTRAVENOUS | Status: AC

## 2020-08-01 MED ORDER — FAMOTIDINE IN NACL 20-0.9 MG/50ML-% IV SOLN: 20.0000 mg | Freq: Once | INTRAVENOUS | Status: DC | PRN

## 2020-08-01 MED ORDER — METHYLPREDNISOLONE SODIUM SUCC 125 MG IJ SOLR: 125.0000 mg | Freq: Once | INTRAMUSCULAR | Status: DC | PRN

## 2020-08-01 NOTE — Progress Notes (Signed)
  Diagnosis: COVID-19  Physician: Dr. Joya Gaskins  Procedure: Cov[facility] Infusion Clinic Med: bamlanivimab\etesevimab infusion - Provided patient with bamlanimivab\etesevimab fact sheet for patients, parents and caregivers prior to infusion.  Complications: No immediate complications noted.  Discharge: Discharged home   Cheri Guppy 08/01/2020

## 2020-08-01 NOTE — Discharge Instructions (Signed)

## 2020-08-05 ENCOUNTER — Other Ambulatory Visit: Payer: Self-pay | Admitting: Nurse Practitioner

## 2020-08-05 DIAGNOSIS — E1140 Type 2 diabetes mellitus with diabetic neuropathy, unspecified: Secondary | ICD-10-CM

## 2020-08-06 ENCOUNTER — Ambulatory Visit: Payer: Self-pay | Admitting: Nurse Practitioner

## 2020-08-17 ENCOUNTER — Encounter: Payer: Self-pay | Admitting: Family

## 2020-08-17 ENCOUNTER — Ambulatory Visit (INDEPENDENT_AMBULATORY_CARE_PROVIDER_SITE_OTHER): Payer: Managed Care, Other (non HMO) | Admitting: Family

## 2020-08-17 DIAGNOSIS — J019 Acute sinusitis, unspecified: Secondary | ICD-10-CM

## 2020-08-17 MED ORDER — DOXYCYCLINE HYCLATE 100 MG PO TABS: 100.0000 mg | ORAL_TABLET | Freq: Two times a day (BID) | ORAL | 0 refills | Status: DC

## 2020-08-17 NOTE — Progress Notes (Signed)
Virtual Visit via telephone Note Due to COVID-19 pandemic this visit was conducted virtually. This visit type was conducted due to national recommendations for restrictions regarding the COVID-19 Pandemic (e.g. social distancing, sheltering in place) in an effort to limit this patient's exposure and mitigate transmission in our community. All issues noted in this document were discussed and addressed.  A physical exam was not performed with this format.  I connected with Brenda Cruz on 08/17/20 at 11:16 AM  by telephone and verified that I am speaking with the correct person using two identifiers. Brenda Cruz is currently located at home and no one is currently with her during visit. The provider, Evelina Dun, [name] is located in their office at time of visit.  I discussed the limitations, risks, security and privacy concerns of performing an evaluation and management service by telephone and the availability of in person appointments. I also discussed with the patient that there may be a patient responsible charge related to this service. The patient expressed understanding and agreed to proceed.   History and Present Illness:  Pt calls the office today with sinus issues. She reports she had COVID 10/11/221. She states states she started feeling slightly better, but over the last week her headache, congestion, and sinus pressure became worse.  Sinusitis This is a new problem. The current episode started 1 to 4 weeks ago. The problem has been gradually worsening since onset. There has been no fever (99). Her pain is at a severity of 6/10. The pain is mild. Associated symptoms include chills, congestion, coughing, headaches, a hoarse voice, sinus pressure (jaw pain) and sneezing. Pertinent negatives include no ear pain, shortness of breath, sore throat or swollen glands. Past treatments include acetaminophen and oral decongestants. The treatment provided mild relief.      Review of  Systems  Constitutional: Positive for chills.  HENT: Positive for congestion, hoarse voice, sinus pressure (jaw pain) and sneezing. Negative for ear pain and sore throat.   Respiratory: Positive for cough. Negative for shortness of breath.   Neurological: Positive for headaches.  All other systems reviewed and are negative.    Observations/Objective: No SOB or distress noted, congestion and hoarse voice  Assessment and Plan: Brenda Cruz comes in today with chief complaint of No chief complaint on file.   Diagnosis and orders addressed:  1. Acute sinusitis, recurrence not specified, unspecified location Rest Force fluids Tylenol as needed Mucinex  Start doxycycline  Work note given  Call if symptoms worsen or do not improve  - doxycycline (VIBRA-TABS) 100 MG tablet; Take 1 tablet (100 mg total) by mouth 2 (two) times daily.  Dispense: 20 tablet; Refill: 0     I discussed the assessment and treatment plan with the patient. The patient was provided an opportunity to ask questions and all were answered. The patient agreed with the plan and demonstrated an understanding of the instructions.   The patient was advised to call back or seek an in-person evaluation if the symptoms worsen or if the condition fails to improve as anticipated.  The above assessment and management plan was discussed with the patient. The patient verbalized understanding of and has agreed to the management plan. Patient is aware to call the clinic if symptoms persist or worsen. Patient is aware when to return to the clinic for a follow-up visit. Patient educated on when it is appropriate to go to the emergency department.   Time call ended:  11:27 AM  I  provided 11 minutes of non-face-to-face time during this encounter.    Evelina Dun, [name]

## 2020-08-18 ENCOUNTER — Telehealth: Payer: Self-pay

## 2020-08-18 NOTE — Telephone Encounter (Signed)
Tried calling patient. No answer, no machine. 

## 2020-08-19 ENCOUNTER — Telehealth: Payer: Self-pay

## 2020-08-19 MED ORDER — AZITHROMYCIN 250 MG PO TABS: ORAL_TABLET | ORAL | 0 refills | Status: DC

## 2020-08-19 NOTE — Telephone Encounter (Signed)
Pt called stating that she has been taking Doxyclycline but cant take it because its too hard on her stomach and is not able to eat. Pt is requesting a ZPack Rx.  Pt also says that [name] told her that if she wasn't feeling better by 08/19/20 to call and let her know and she would extend her work note. Pt says she is not feeling better and needs more time to feel better.  Please call patient ASAP.

## 2020-09-16 IMAGING — DX DG RIBS W/ CHEST 3+V*L*
5 series · 5 of 5 positions shown · non-contrast
Comparison: Chest radiographs 04/01/2020

CLINICAL DATA: Left rib pain.

EXAM:
LEFT RIBS AND CHEST - 3+ VIEW

[chest pa]
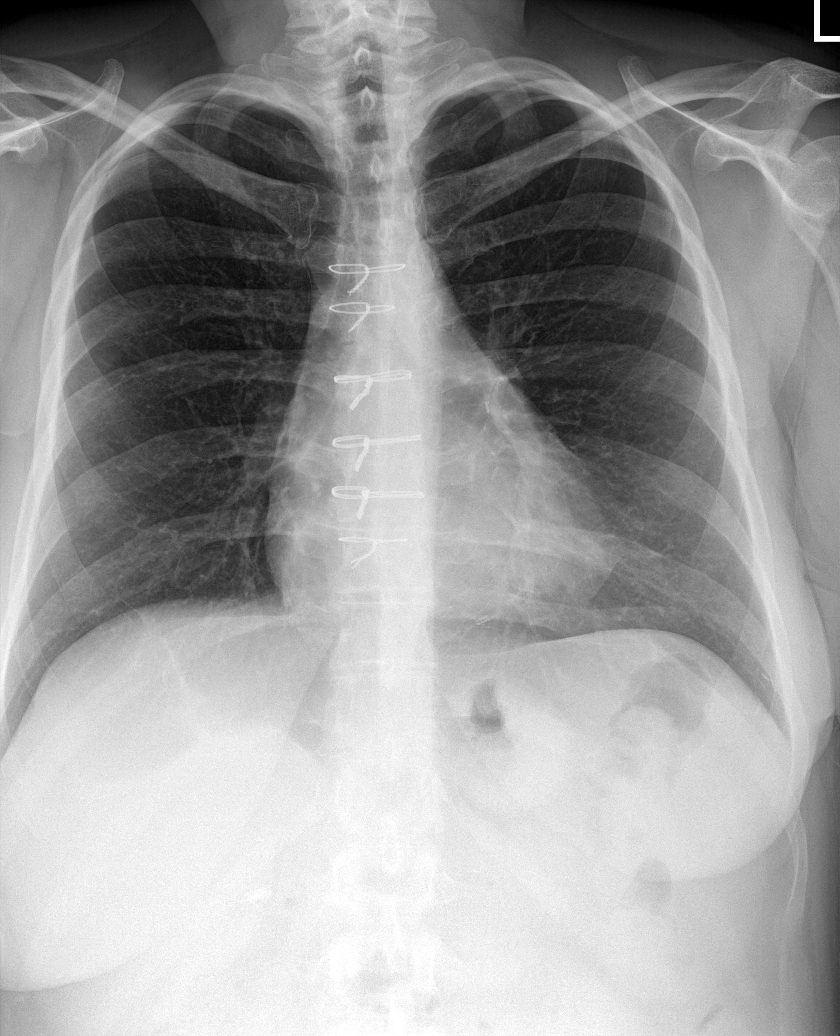

[rib obl (1 of 2)]
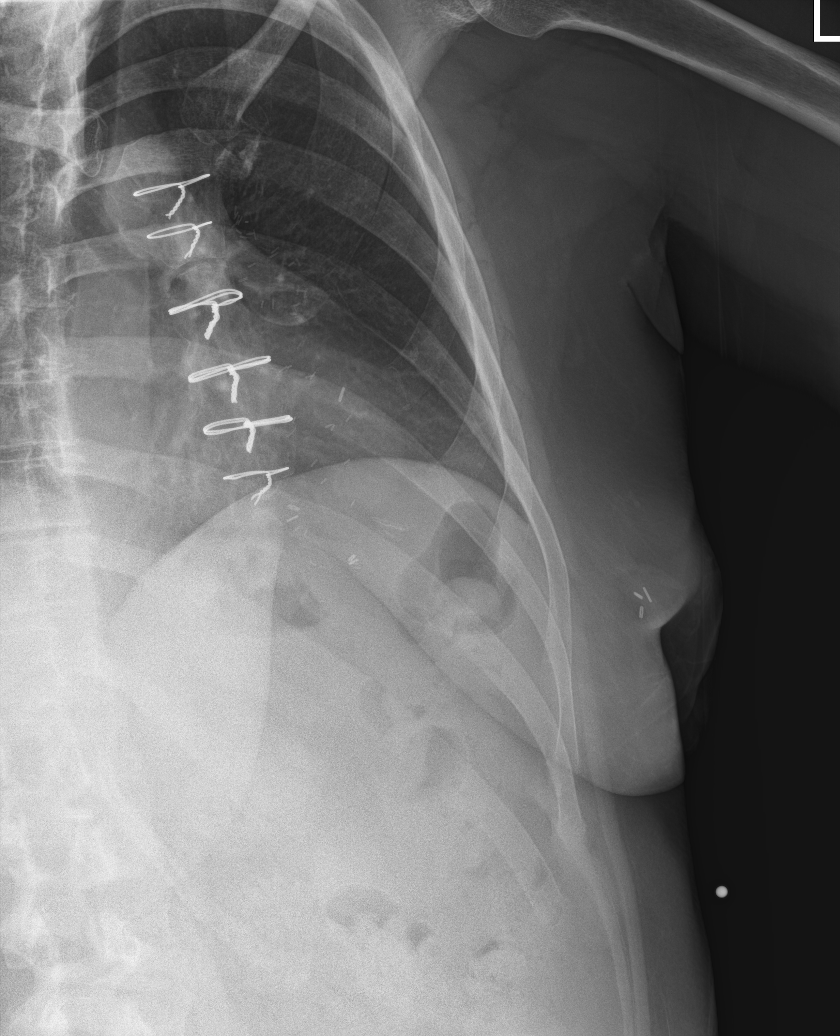

[rib obl (2 of 2)]
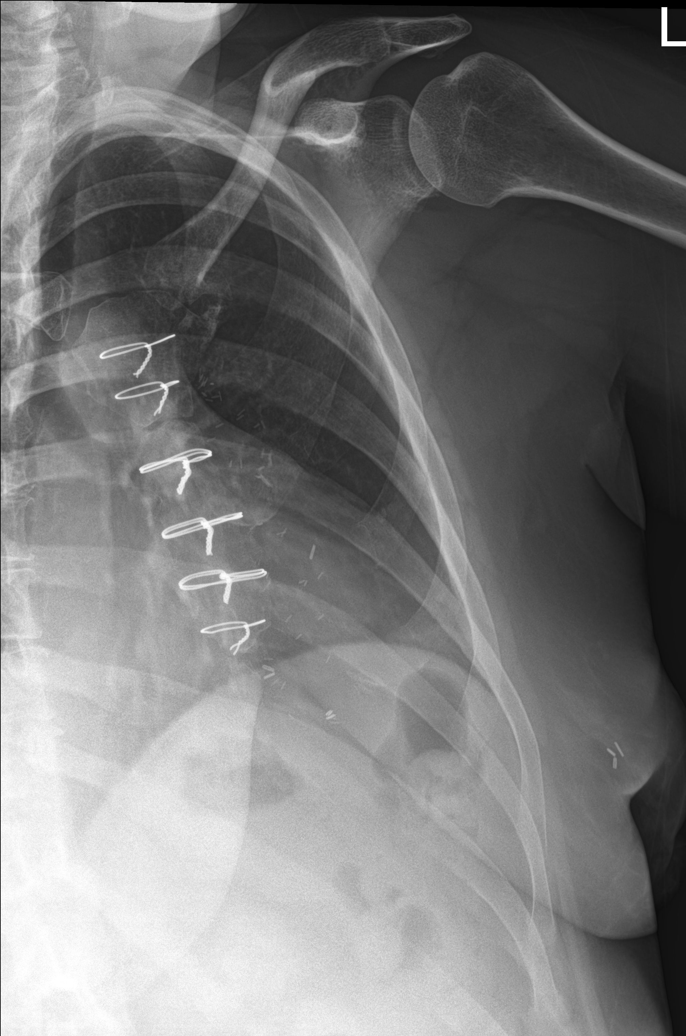

[rib pa (1 of 2)]
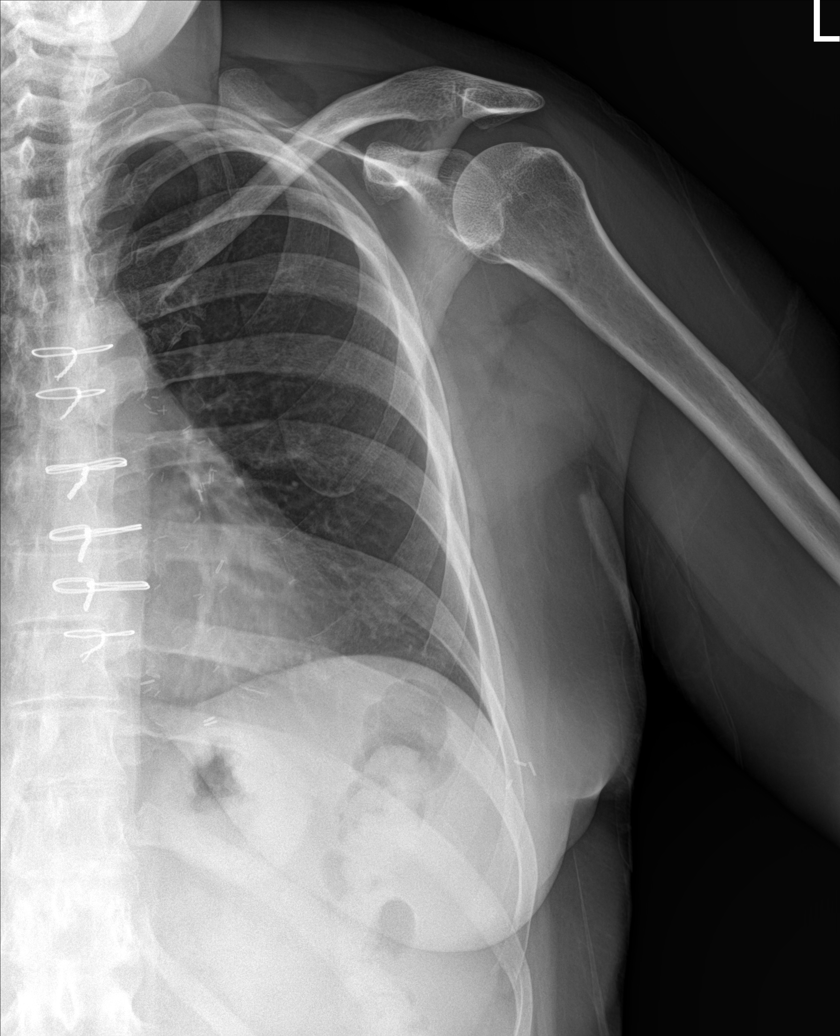

[rib pa (2 of 2)]
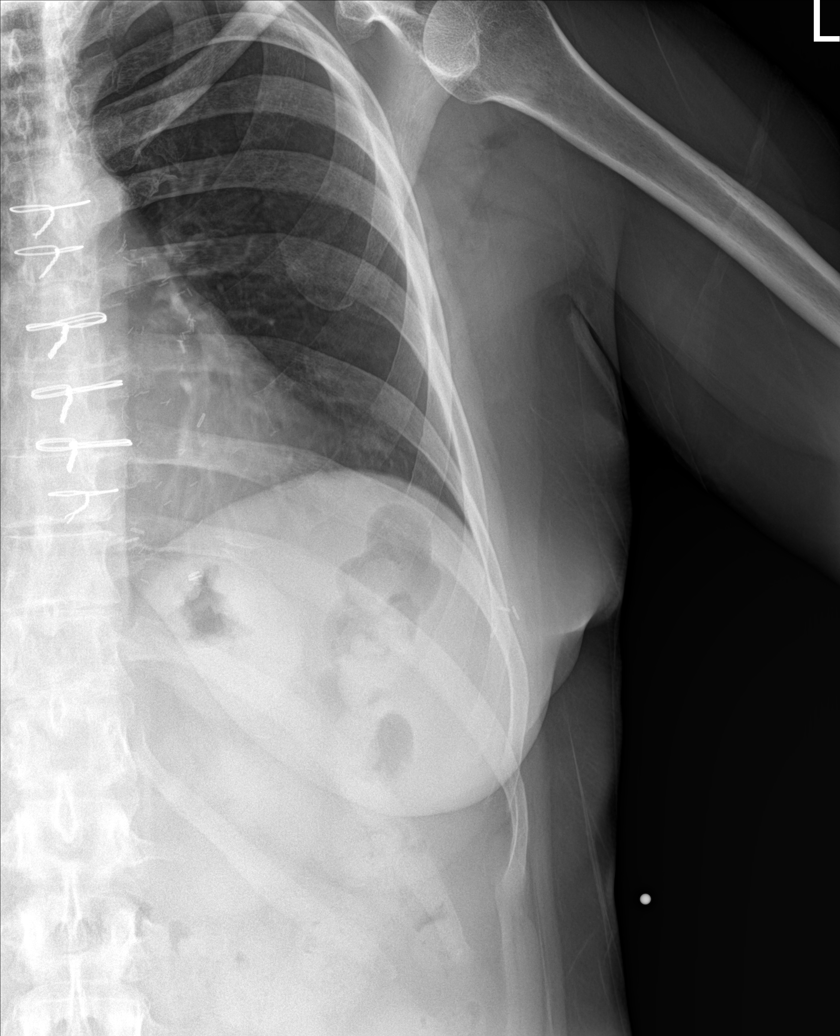

[5 of 5 positions shown; findings below may reference images not displayed]

FINDINGS: Sequelae of CABG are again identified. The cardiomediastinal
silhouette is unchanged with normal heart size. The lungs are well
inflated and clear. No pleural effusion or pneumothorax is
identified. Clips project over the right upper abdomen and left
breast. No rib fracture is identified.
IMPRESSION: No rib fracture identified.

## 2020-10-13 ENCOUNTER — Telehealth: Payer: Self-pay

## 2020-10-13 ENCOUNTER — Ambulatory Visit (INDEPENDENT_AMBULATORY_CARE_PROVIDER_SITE_OTHER): Payer: Managed Care, Other (non HMO) | Admitting: Pharmacist

## 2020-10-13 DIAGNOSIS — E1140 Type 2 diabetes mellitus with diabetic neuropathy, unspecified: Secondary | ICD-10-CM

## 2020-10-13 MED ORDER — ROSUVASTATIN CALCIUM 40 MG PO TABS
40.0000 mg | ORAL_TABLET | Freq: Every day | ORAL | 3 refills | Status: DC
Start: 2020-10-13 — End: 2021-01-12

## 2020-10-13 MED ORDER — GABAPENTIN 100 MG PO CAPS: 300.0000 mg | ORAL_CAPSULE | Freq: Two times a day (BID) | ORAL | 1 refills | Status: DC

## 2020-10-13 NOTE — Progress Notes (Signed)
    10/13/2020 Name: Brenda Cruz MRN: [id number] DOB: Oct 11, 1966   S:  76 yoF Presents for diabetes evaluation, education, and management.  Patient with complex medical history: T2DM, HTN, HLD, s/p CABGx1. Patient was referred and last seen by Primary Care Provider on 08/17/20.  Insurance coverage/medication affordability: Child psychotherapist  Patient denies adherence with medications. . Current diabetes medications include: not taking . Current hypertension medications include: metop, stopped others due to low BP, now BP is elevated Goal 130/80 . Current hyperlipidemia medications include: not taking   Patient denies hypoglycemic events.    O:  Lab Results  Component Value Date   HGBA1C 8.2 (H) 04/24/2020    Lipid Panel     Component Value Date/Time   CHOL 310 (H) 04/01/2020 0932   TRIG 324 (H) 04/01/2020 0932   HDL 31 (L) 04/01/2020 0932   CHOLHDL 10.0 (H) 04/01/2020 0932   CHOLHDL 8.1 10/30/2015 0300   VLDL 29 10/30/2015 0300   LDLCALC 212 (H) 04/01/2020 0932    Home fasting blood sugars: needs to test  2 hour post-meal/random blood sugars: n/a   Clinical Atherosclerotic Cardiovascular Disease (ASCVD): Yes   The 10-year ASCVD risk score Denman George DC Jr., et al., 2013) is: 37.9%   Values used to calculate the score:     Age: 48 years     Sex: Female     Is Non-Hispanic African American: No     Diabetic: Yes     Tobacco smoker: Yes     Systolic Blood Pressure: 149 mmHg     Is BP treated: Yes     HDL Cholesterol: 31 mg/dL     Total Cholesterol: 310 mg/dL    A/P:  Diabetes H2RF currently uncontrolled.  Patient has periodically stopped medications.  She admits that she would like to restart them as she knows she needs them.  Patient is not adherent with medication.   Hypertension:  Was on amlodipine, Stopped it due to low BP  Now BP 170/102  Restarted norvasc 2.5mg  daily  Continued metoprolol  Continue taking all heart medications per  cardiologist   Recommended patient restart crestor 40 mg --rx called in   Gabapentin-PCP increased dose   Restarted rybelsus for T2DM--patient has tolerated this well in the past   Denies history of thyroid cancer  -Extensively discussed pathophysiology of diabetes, recommended lifestyle interventions, dietary effects on blood sugar control  -Counseled on s/sx of and management of hypoglycemia  -Next A1C anticipated 11/05/20.  Written patient instructions provided.  Total time in face to face counseling 25 minutes.   Follow up PCP Clinic Visit ON 11/05/20.    Kieth Brightly, PharmD, BCPS Clinical Pharmacist, [facility]  II Phone [phone] Kieth Brightly, PharmD, BCPS Clinical Pharmacist, [facility]  II Phone [phone]

## 2020-10-30 ENCOUNTER — Telehealth: Payer: Managed Care, Other (non HMO) | Admitting: Family

## 2020-10-30 DIAGNOSIS — N3000 Acute cystitis without hematuria: Secondary | ICD-10-CM | POA: Diagnosis not present

## 2020-10-30 MED ORDER — NITROFURANTOIN MONOHYD MACRO 100 MG PO CAPS: 100.0000 mg | ORAL_CAPSULE | Freq: Two times a day (BID) | ORAL | 0 refills | Status: DC

## 2020-10-30 NOTE — Progress Notes (Signed)
Based on what you shared with me, I feel your condition warrants further evaluation and I recommend that you be seen for a face to face office visit.   NOTE: If you entered your credit card information for this eVisit, you will not be charged. You may see a "hold" on your card for the $35 but that hold will drop off and you will not have a charge processed.   If you are having a true medical emergency please call 911.      For an urgent face to face visit, [place] has five urgent care centers for your convenience:     [facility] Get Driving Directions [phone] [address] [place], [place] [address] . 10 am - 6pm Monday - Friday    [facility] Get Driving Directions [phone] [address] [place], [place] [address] . 10 am to 8 pm Monday-Friday . 12 pm to 8 pm Saturday-Sunday     [facility] Get Driving Directions [phone] [address] [place], [place] [address] . 8 am to 8 pm Monday-Friday . 9 am to 6 pm Saturday . 11 am to 6 pm Sunday     [facility] [place] Get Driving Directions  [phone] [address] [place], [place] [address] . 8 am to 8 pm Monday-Friday . 8 am to 4 pm Saturday-Sunday   [facility] Get Driving Directions [phone] [address] [place], [place] [address] . 12 pm to 6 pm Monday-Friday      Your e-visit answers were reviewed by a board certified advanced clinical practitioner to complete your personal care plan.  Thank you for using e-Visits.     

## 2020-10-30 NOTE — Progress Notes (Signed)
We are sorry that you are not feeling well.  Here is how we plan to help!  Based on what you shared with me it looks like you most likely have a simple urinary tract infection.  A UTI (Urinary Tract Infection) is a bacterial infection of the bladder.  Most cases of urinary tract infections are simple to treat but a key part of your care is to encourage you to drink plenty of fluids and watch your symptoms carefully.  * e usually do not treat patients uti if they have back pain because really need urinalysis an durine culture. I am gong to go ahead and treat you to prevenyt you from going to urgent cre. If your symptoms are no better in a couple of days you will need a face to face visit.  I have prescribed MacroBid 100 mg twice a day for 5 days.  Your symptoms should gradually improve. Call us if the burning in your urine worsens, you develop worsening fever, back pain or pelvic pain or if your symptoms do not resolve after completing the antibiotic.  Urinary tract infections can be prevented by drinking plenty of water to keep your body hydrated.  Also be sure when you wipe, wipe from front to back and don't hold it in!  If possible, empty your bladder every 4 hours.  Your e-visit answers were reviewed by a board certified advanced clinical practitioner to complete your personal care plan.  Depending on the condition, your plan could have included both over the counter or prescription medications.  If there is a problem please reply  once you have received a response from your provider.  Your safety is important to [place].  If you have drug allergies check your prescription carefully.    You can use MyChart to ask questions about today's visit, request a non-urgent call back, or ask for a work or school excuse for 24 hours related to this e-Visit. If it has been greater than 24 hours you will need to follow up with your provider, or enter a new e-Visit to address those concerns.   You will get an  e-mail in the next two days asking about your experience.  I hope that your e-visit has been valuable and will speed your recovery. Thank you for using e-visits.  5-10 minutes spent reviewing and documenting in chart.

## 2020-11-04 ENCOUNTER — Encounter: Payer: Self-pay | Admitting: *Deleted

## 2020-11-04 ENCOUNTER — Other Ambulatory Visit: Payer: Self-pay

## 2020-11-04 ENCOUNTER — Ambulatory Visit: Payer: Managed Care, Other (non HMO)

## 2020-11-05 ENCOUNTER — Encounter: Payer: Self-pay | Admitting: Nurse Practitioner

## 2020-11-05 ENCOUNTER — Ambulatory Visit (INDEPENDENT_AMBULATORY_CARE_PROVIDER_SITE_OTHER): Payer: Managed Care, Other (non HMO) | Admitting: Nurse Practitioner

## 2020-11-05 VITALS — BP 123/75 | HR 73 | Temp 97.0°F | Resp 20 | Ht 68.0 in | Wt 205.0 lb

## 2020-11-05 DIAGNOSIS — F5101 Primary insomnia: Secondary | ICD-10-CM | POA: Diagnosis not present

## 2020-11-05 DIAGNOSIS — F411 Generalized anxiety disorder: Secondary | ICD-10-CM | POA: Diagnosis not present

## 2020-11-05 MED ORDER — LORAZEPAM 0.5 MG PO TABS: 0.5000 mg | ORAL_TABLET | Freq: Three times a day (TID) | ORAL | 2 refills | Status: DC | PRN

## 2020-11-05 NOTE — Progress Notes (Signed)
   Subjective:    Patient ID: Brenda Cruz, female    DOB: [birth year], 55 y.o.   MRN: [id number]   Chief Complaint: Anxiety   HPI Patient comes in today with multiple complaints - anxiety- we took her off of ativan and put her klonopin and she says the klonopin is not working. She says she stays anxious all the time. She also takes prozac. She would like to go back to the ativan. She has fatigue and memory problems which sh ethinks is all related. Depression screen [facility] 2/9 11/05/2020 07/02/2020 05/25/2020  Decreased Interest 1 0 0  Down, Depressed, Hopeless 2 0 0  PHQ - 2 Score 3 0 0  Altered sleeping 3 - -  Tired, decreased energy 3 - -  Change in appetite 0 - -  Feeling bad or failure about yourself  0 - -  Trouble concentrating 1 - -  Moving slowly or fidgety/restless 0 - -  Suicidal thoughts 0 - -  PHQ-9 Score 10 - -  Difficult doing work/chores Not difficult at all - -  Some recent data might be hidden   GAD 7 : Generalized Anxiety Score 11/05/2020 04/01/2020 11/21/2019  Nervous, Anxious, on Edge 3 3 2   Control/stop worrying 3 3 2   Worry too much - different things 3 3 2   Trouble relaxing 1 1 1   Restless 0 0 0  Easily annoyed or irritable 0 1 0  Afraid - awful might happen 1 1 1   Total GAD 7 Score 11 12 8   Anxiety Difficulty Not difficult at all Not difficult at all Somewhat difficult        Review of Systems  Psychiatric/Behavioral: Positive for behavioral problems. The patient is nervous/anxious.        Objective:   Physical Exam Vitals and nursing note reviewed.  Constitutional:      Appearance: Normal appearance.  Cardiovascular:     Rate and Rhythm: Normal rate and regular rhythm.     Heart sounds: Normal heart sounds.  Skin:    General: Skin is warm.  Neurological:     General: No focal deficit present.     Mental Status: She is alert and oriented to person, place, and time.  Psychiatric:        Mood and Affect: Mood normal.        Behavior:  Behavior normal.    BP 123/75   Pulse 73   Temp (!) 97 F (36.1 C) (Temporal)   Resp 20   Ht 5\' 8"  (1.727 m)   Wt 205 lb (93 kg)   SpO2 100%   BMI 31.17 kg/m        Assessment & Plan:  Brenda Cruz in today with chief complaint of Anxiety   1. GAD (generalized anxiety disorder) Stop klonopin Changed to ativan 0.5 TID #90 2 refills Stress management  2. Primary insomnia Bedtime routine Melatonin OTC    The above assessment and management plan was discussed with the patient. The patient verbalized understanding of and has agreed to the management plan. Patient is aware to call the clinic if symptoms persist or worsen. Patient is aware when to return to the clinic for a follow-up visit. Patient educated on when it is appropriate to go to the emergency department.   [name] [name], [name]

## 2020-11-05 NOTE — Patient Instructions (Signed)
Textbook of family medicine (9th ed., pp. 1062-1073). [place], [place]: [employer].">  Stress, Adult Stress is a normal reaction to life events. Stress is what you feel when life demands more than you are used to, or more than you think you can handle. Some stress can be useful, such as studying for a test or meeting a deadline at work. Stress that occurs too often or for too long can cause problems. It can affect your emotional health and interfere with relationships and normal daily activities. Too much stress can weaken your body's defense system (immune system) and increase your risk for physical illness. If you already have a medical problem, stress can make it worse. What are the causes? All sorts of life events can cause stress. An event that causes stress for one person may not be stressful for another person. Major life events, whether positive or negative, commonly cause stress. Examples include:  Losing a job or starting a new job.  Losing a loved one.  Moving to a new town or home.  Getting married or divorced.  Having a baby.  Getting injured or sick. Less obvious life events can also cause stress, especially if they occur day after day or in combination with each other. Examples include:  Working long hours.  Driving in traffic.  Caring for children.  Being in debt.  Being in a difficult relationship. What are the signs or symptoms? Stress can cause emotional symptoms, including:  Anxiety. This is feeling worried, afraid, on edge, overwhelmed, or out of control.  Anger, including irritation or impatience.  Depression. This is feeling sad, down, helpless, or guilty.  Trouble focusing, remembering, or making decisions. Stress can cause physical symptoms, including:  Aches and pains. These may affect your head, neck, back, stomach, or other areas of your body.  Tight muscles or a clenched jaw.  Low energy.  Trouble sleeping. Stress can cause unhealthy  behaviors, including:  Eating to feel better (overeating) or skipping meals.  Working too much or putting off tasks.  Smoking, drinking alcohol, or using drugs to feel better. How is this diagnosed? Stress is diagnosed through an assessment by your health care provider. He or she may diagnose this condition based on:  Your symptoms and any stressful life events.  Your medical history.  Tests to rule out other causes of your symptoms. Depending on your condition, your health care provider may refer you to a specialist for further evaluation. How is this treated? Stress management techniques are the recommended treatment for stress. Medicine is not typically recommended for the treatment of stress. Techniques to reduce your reaction to stressful life events include:  Stress identification. Monitor yourself for symptoms of stress and identify what causes stress for you. These skills may help you to avoid or prepare for stressful events.  Time management. Set your priorities, keep a calendar of events, and learn to say no. Taking these actions can help you avoid making too many commitments. Techniques for coping with stress include:  Rethinking the problem. Try to think realistically about stressful events rather than ignoring them or overreacting. Try to find the positives in a stressful situation rather than focusing on the negatives.  Exercise. Physical exercise can release both physical and emotional tension. The key is to find a form of exercise that you enjoy and do it regularly.  Relaxation techniques. These relax the body and mind. The key is to find one or more that you enjoy and use the techniques regularly. Examples include: ?   Meditation, deep breathing, or progressive relaxation techniques. ? Yoga or tai chi. ? Biofeedback, mindfulness techniques, or journaling. ? Listening to music, being out in nature, or participating in other hobbies.  Practicing a healthy lifestyle.  Eat a balanced diet, drink plenty of water, limit or avoid caffeine, and get plenty of sleep.  Having a strong support network. Spend time with family, friends, or other people you enjoy being around. Express your feelings and talk things over with someone you trust. Counseling or talk therapy with a mental health professional may be helpful if you are having trouble managing stress on your own.   Follow these instructions at home: Lifestyle  Avoid drugs.  Do not use any products that contain nicotine or tobacco, such as cigarettes, e-cigarettes, and chewing tobacco. If you need help quitting, ask your health care provider.  Limit alcohol intake to no more than 1 drink a day for nonpregnant women and 2 drinks a day for men. One drink equals 12 oz of beer, 5 oz of wine, or 1 oz of hard liquor  Do not use alcohol or drugs to relax.  Eat a balanced diet that includes fresh fruits and vegetables, whole grains, lean meats, fish, eggs, and beans, and low-fat dairy. Avoid processed foods and foods high in added fat, sugar, and salt.  Exercise at least 30 minutes on 5 or more days each week.  Get 7-8 hours of sleep each night.   General instructions  Practice stress management techniques as discussed with your health care provider.  Drink enough fluid to keep your urine clear or pale yellow.  Take over-the-counter and prescription medicines only as told by your health care provider.  Keep all follow-up visits as told by your health care provider. This is important.   Contact a health care provider if:  Your symptoms get worse.  You have new symptoms.  You feel overwhelmed by your problems and can no longer manage them on your own. Get help right away if:  You have thoughts of hurting yourself or others. If you ever feel like you may hurt yourself or others, or have thoughts about taking your own life, get help right away. You can go to your nearest emergency department or  call:  Your local emergency services (911 in the [place]  A suicide crisis helpline, such as the National Suicide Prevention Lifeline at 1-800-273-8255. This is open 24 hours a day. Summary  Stress is a normal reaction to life events. It can cause problems if it happens too often or for too long.  Practicing stress management techniques is the best way to treat stress.  Counseling or talk therapy with a mental health professional may be helpful if you are having trouble managing stress on your own. This information is not intended to replace advice given to you by your health care provider. Make sure you discuss any questions you have with your health care provider. Document Revised: 06/19/2020 Document Reviewed: 06/19/2020 Elsevier Patient Education  2021 Elsevier Inc.  

## 2020-11-23 LAB — HM MAMMOGRAPHY: HM Mammogram: NORMAL (ref 0–4)

## 2020-11-24 ENCOUNTER — Encounter: Payer: Self-pay | Admitting: Family

## 2020-11-24 ENCOUNTER — Ambulatory Visit (INDEPENDENT_AMBULATORY_CARE_PROVIDER_SITE_OTHER): Payer: Managed Care, Other (non HMO) | Admitting: Family

## 2020-11-24 DIAGNOSIS — Z923 Personal history of irradiation: Secondary | ICD-10-CM | POA: Insufficient documentation

## 2020-11-24 DIAGNOSIS — J441 Chronic obstructive pulmonary disease with (acute) exacerbation: Secondary | ICD-10-CM | POA: Diagnosis not present

## 2020-11-24 DIAGNOSIS — J189 Pneumonia, unspecified organism: Secondary | ICD-10-CM | POA: Diagnosis not present

## 2020-11-24 DIAGNOSIS — Z1211 Encounter for screening for malignant neoplasm of colon: Secondary | ICD-10-CM | POA: Insufficient documentation

## 2020-11-24 DIAGNOSIS — Z853 Personal history of malignant neoplasm of breast: Secondary | ICD-10-CM | POA: Insufficient documentation

## 2020-11-24 DIAGNOSIS — Z9889 Other specified postprocedural states: Secondary | ICD-10-CM | POA: Insufficient documentation

## 2020-11-24 MED ORDER — ONDANSETRON HCL 4 MG PO TABS: 4.0000 mg | ORAL_TABLET | Freq: Three times a day (TID) | ORAL | 0 refills | Status: DC | PRN

## 2020-11-24 MED ORDER — LEVOFLOXACIN 500 MG PO TABS: 500.0000 mg | ORAL_TABLET | Freq: Every day | ORAL | 0 refills | Status: DC

## 2020-11-24 NOTE — Progress Notes (Signed)
Virtual Visit via telephone Note Due to COVID-19 pandemic this visit was conducted virtually. This visit type was conducted due to national recommendations for restrictions regarding the COVID-19 Pandemic (e.g. social distancing, sheltering in place) in an effort to limit this patient's exposure and mitigate transmission in our community. All issues noted in this document were discussed and addressed.  A physical exam was not performed with this format.  I connected with Brenda Cruz on 11/24/20 at 4:50 pm  by telephone and verified that I am speaking with the correct person using two identifiers. Brenda Cruz is currently located at home  and no no is currently with her during visit. The provider, Evelina Dun, [name] is located in their office at time of visit.  I discussed the limitations, risks, security and privacy concerns of performing an evaluation and management service by telephone and the availability of in person appointments. I also discussed with the patient that there may be a patient responsible charge related to this service. The patient expressed understanding and agreed to proceed.   History and Present Illness:  Pt calls the office today with cough and fevers that started two weeks ago. She reports she has taken multiple COVID tests that have been negative. She reports on 10/20/20 she had found three Levaquin 500 mg and took these. She states she started feeling better, but today she started feeling worse and started have fever and SOB.   Cough This is a new problem. The current episode started 1 to 4 weeks ago. The problem has been gradually worsening. The problem occurs every few minutes. The cough is productive of purulent sputum and productive of brown sputum. Associated symptoms include chills, a fever, myalgias, postnasal drip, rhinorrhea, a sore throat, shortness of breath and wheezing. Pertinent negatives include no ear congestion, ear pain or headaches. The symptoms  are aggravated by lying down. Risk factors for lung disease include smoking/tobacco exposure. The treatment provided moderate relief.      Review of Systems  Constitutional: Positive for chills and fever.  HENT: Positive for postnasal drip, rhinorrhea and sore throat. Negative for ear pain.   Respiratory: Positive for cough, shortness of breath and wheezing.   Musculoskeletal: Positive for myalgias.  Neurological: Negative for headaches.  All other systems reviewed and are negative.    Observations/Objective: Mild SOB and wheezing noted, hoarse voice  Assessment and Plan: Brenda Cruz comes in today with chief complaint of No chief complaint on file.   Diagnosis and orders addressed:  1. Community acquired pneumonia, unspecified laterality - Take meds as prescribed - Use a cool mist humidifier  -Use saline nose sprays frequently -Force fluids -For any cough or congestion  Use plain Mucinex- regular strength or max strength is fine -For fever or aces or pains- take tylenol or ibuprofen. -Throat lozenges if help -Strict precautions discussed to go to ED Will hold off on steroids as glucose is already elevated. - levofloxacin (LEVAQUIN) 500 MG tablet; Take 1 tablet (500 mg total) by mouth daily.  Dispense: 7 tablet; Refill: 0 - ondansetron (ZOFRAN) 4 MG tablet; Take 1 tablet (4 mg total) by mouth every 8 (eight) hours as needed for nausea or vomiting.  Dispense: 20 tablet; Refill: 0  2. COPD exacerbation ([place])        I discussed the assessment and treatment plan with the patient. The patient was provided an opportunity to ask questions and all were answered. The patient agreed with the plan and demonstrated an  understanding of the instructions.   The patient was advised to call back or seek an in-person evaluation if the symptoms worsen or if the condition fails to improve as anticipated.  The above assessment and management plan was discussed with the patient. The  patient verbalized understanding of and has agreed to the management plan. Patient is aware to call the clinic if symptoms persist or worsen. Patient is aware when to return to the clinic for a follow-up visit. Patient educated on when it is appropriate to go to the emergency department.   Time call ended:  5:01 pm   I provided 11 minutes of non-face-to-face time during this encounter.    Evelina Dun, [name]

## 2020-12-02 ENCOUNTER — Other Ambulatory Visit: Payer: Self-pay

## 2020-12-02 ENCOUNTER — Ambulatory Visit (INDEPENDENT_AMBULATORY_CARE_PROVIDER_SITE_OTHER): Payer: Managed Care, Other (non HMO)

## 2020-12-02 DIAGNOSIS — Z23 Encounter for immunization: Secondary | ICD-10-CM | POA: Diagnosis not present

## 2020-12-02 NOTE — Progress Notes (Signed)
   Covid-19 Vaccination Clinic  Name:  Brenda Cruz    MRN: [id number] DOB: 1966-08-20  12/02/2020  Brenda Cruz was observed post Covid-19 immunization for 30 minutes based on pre-vaccination screening without incident. She was provided with Vaccine Information Sheet and instruction to access the V-Safe system.   Brenda Cruz was instructed to call 911 with any severe reactions post vaccine: Marland Kitchen Difficulty breathing  . Swelling of face and throat  . A fast heartbeat  . A bad rash all over body  . Dizziness and weakness   Immunizations Administered    Name Date Dose VIS Date Route   PFIZER Comrnaty(Gray TOP) Covid-19 Vaccine 12/02/2020  9:10 AM 0.3 mL 09/24/2020 Intramuscular   Manufacturer: [employer], [employer]   Lot: [id number]   NDC: [phone]

## 2020-12-03 ENCOUNTER — Ambulatory Visit: Payer: Self-pay | Admitting: Nurse Practitioner

## 2020-12-05 ENCOUNTER — Other Ambulatory Visit: Payer: Self-pay | Admitting: Nurse Practitioner

## 2020-12-05 DIAGNOSIS — E1140 Type 2 diabetes mellitus with diabetic neuropathy, unspecified: Secondary | ICD-10-CM

## 2020-12-30 ENCOUNTER — Ambulatory Visit: Payer: Managed Care, Other (non HMO)

## 2021-01-07 ENCOUNTER — Encounter: Payer: Self-pay | Admitting: Nurse Practitioner

## 2021-01-07 ENCOUNTER — Other Ambulatory Visit: Payer: Self-pay

## 2021-01-07 ENCOUNTER — Ambulatory Visit: Payer: Managed Care, Other (non HMO) | Admitting: Nurse Practitioner

## 2021-01-07 VITALS — BP 135/87 | HR 77 | Temp 97.4°F | Resp 20 | Ht 68.0 in | Wt 206.0 lb

## 2021-01-07 DIAGNOSIS — R1032 Left lower quadrant pain: Secondary | ICD-10-CM | POA: Diagnosis not present

## 2021-01-07 NOTE — Progress Notes (Signed)
   Subjective:    Patient ID: Brenda Cruz, female    DOB: [birth year], 55 y.o.   MRN: [id number]   Chief Complaint: Knot on left side of groin area   HPI  Pt presents today with concerns for a "knot in her left groin." She recently has been on a long car ride trip and is concerned it may be a blood clot. She noticed the area last night when she got home. There is throbbing pain in the area rated between 3 to 7/10. Once she straighten her torso out, the pain is relieved, but still sore to touch. The area is not warm to touch and not reddened. Denies any injury to the area. She noticed more swelling on the left lower extremity over her right lower extremity.   Review of Systems  Constitutional: Negative for chills, fatigue and fever.  Respiratory: Positive for shortness of breath. Negative for cough and wheezing.   Cardiovascular: Positive for chest pain and leg swelling. Negative for palpitations.       Left lateral chest wall pain Left lower extremity edema  Gastrointestinal: Negative for abdominal distention, constipation and diarrhea.  Genitourinary: Negative for dyspareunia, flank pain and frequency.  Musculoskeletal: Positive for back pain.  Neurological: Negative for dizziness, light-headedness and headaches.  Psychiatric/Behavioral: The patient is not nervous/anxious.        Objective:   Physical Exam Constitutional:      Appearance: She is obese.  Cardiovascular:     Rate and Rhythm: Normal rate and regular rhythm.     Pulses:          Femoral pulses are 2+ on the right side and 2+ on the left side.      Popliteal pulses are 1+ on the right side and 1+ on the left side.       Posterior tibial pulses are 2+ on the right side and 2+ on the left side.     Heart sounds: Normal heart sounds.  Pulmonary:     Effort: Pulmonary effort is normal.     Breath sounds: Normal breath sounds.  Abdominal:     General: Bowel sounds are normal.     Palpations: Abdomen is soft.   Musculoskeletal:        General: Tenderness present. No swelling.     Cervical back: Normal range of motion.     Right lower leg: No edema.     Left lower leg: No edema.  Skin:    General: Skin is warm and dry.     Capillary Refill: Capillary refill takes less than 2 seconds.  Neurological:     Mental Status: She is alert and oriented to person, place, and time.  Psychiatric:        Behavior: Behavior normal.    Vitals:   01/07/21 0957  BP: 135/87  Pulse: 77  Resp: 20  Temp: (!) 97.4 F (36.3 C)  SpO2: 98%      Assessment & Plan:   [name] comes in today with chief complaint of Knot on left side of groin area   Diagnosis and orders addressed:  1. Left groin pain Continue to monitor for lower extremity edema, increased pain.  Reviewed red flags of pulmonary edema, venous and arterial clotting and when to call 911.    Labs pending Health Maintenance reviewed Diet and exercise encouraged  Follow up plan: As scheduled.   Dollene Primrose, RN, BSN, [name]-Student  [name] [name], [name]

## 2021-01-07 NOTE — Patient Instructions (Signed)
Deep Vein Thrombosis  Deep vein thrombosis (DVT) is a condition in which a blood clot forms in a deep vein, such as a vein in the lower leg, thigh, pelvis, or arm. Deep veins are veins in the deep venous system. A clot is blood that has thickened into a gel or solid. This condition is serious and can be life-threatening if the clot travels to the lungs and causes a blockage (pulmonary embolism) in the arteries of the lung. A DVT can also damage veins in the leg. This can lead to long-term, or chronic, venous disease, leg pain, swelling, discoloration, and ulcers or sores (post-thrombotic syndrome). What are the causes? This condition may be caused by:  A slowdown of blood flow.  Damage to a vein.  A condition that causes blood to clot more easily, such as certain blood-clotting disorders. What increases the risk? The following factors may make you more likely to develop this condition:  Having obesity.  Being older, especially older than age 60.  Being inactive (sedentary lifestyle) or not moving around. This may include: ? Sitting or lying down for longer than 4-6 hours other than to sleep at night. ? Being in the hospital, having major or lengthy surgery, or having a thin, flexible tube (central line catheter) placed in a large vein.  Being pregnant, giving birth, or having recently given birth.  Taking medicines that contain estrogen, such as birth control or hormone replacement therapy.  Using products that contain nicotine or tobacco, especially if you use hormonal birth control.  Having a history of blood clots or a blood-clotting disease, a blood vessel disease (peripheral vascular disease), or congestive heart disease.  Having a history of cancer, especially if being treated with chemotherapy. What are the signs or symptoms? Symptoms of this condition include:  Swelling, pain, pressure, or tenderness in an arm or a leg.  An arm or a leg becoming warm, red, or  discolored.  A leg turning very pale. You may have a large DVT. This is rare. If the clot is in your leg, you may notice symptoms more or have worse symptoms when you stand or walk. In some cases, there are no symptoms. How is this diagnosed? This condition is diagnosed with:  Your medical history and a physical exam.  Tests, such as: ? Blood tests to check how well your blood clots. ? Doppler ultrasound. This is the best way to find a DVT. ? Venogram. Contrast dye is injected into a vein, and X-rays are taken to check for clots. How is this treated? Treatment for this condition depends on:  The cause of your DVT.  The size and location of your DVT, or having more than one DVT.  Your risk for bleeding or developing more clots.  Other medical conditions you may have. Treatment may include:  Taking a blood thinner, also called an anticoagulant, to prevent clots from forming and growing.  Wearing compression stockings, if directed.  Injecting medicines into the affected vein to break up the clot (catheter-directed thrombolysis). This is used only for severe DVT and only if a specialist recommends it.  Specific surgical procedures, when DVT is severe or hard to treat. These may be done to: ? Isolate and remove your clot. ? Place an inferior vena cava (IVC) filter in a large vein to catch blood clots before they reach your lungs. You may get some medical treatments for 6 months or longer. Follow these instructions at home: If you are taking blood thinners:    Talk with your health care provider before you take any medicines that contain aspirin or NSAIDs, such as ibuprofen. These medicines increase your risk for dangerous bleeding.  Take your medicine exactly as told, at the same time every day. Do not skip a dose. Do not take more than the prescribed dose. This is important.  Ask your health care provider about foods and medicines that could change the way your blood thinner  works (may interact). Avoid these foods and medicines if you are told to do so.  Avoid anything that may cause bleeding or bruising. You may bleed more easily while taking blood thinners. ? Be very careful when using knives, scissors, or other sharp objects. ? Use an electric razor instead of a blade. ? Avoid activities that could cause injury or bruising, and follow instructions for preventing falls. ? Tell your health care provider if you have had any internal bleeding, bleeding ulcers, or neurologic diseases, such as strokes or cerebral aneurysms.  Wear a medical alert bracelet or carry a card that lists what medicines you take. General instructions  Take over-the-counter and prescription medicines only as told by your health care provider.  Return to your normal activities as told by your health care provider. Ask your health care provider what activities are safe for you.  If recommended, wear compression stockings as told by your health care provider. These stockings help to prevent blood clots and reduce swelling in your legs.  Keep all follow-up visits as told by your health care provider. This is important. Contact a health care provider if:  You miss a dose of your blood thinner.  You have new or worse pain, swelling, or redness in an arm or a leg.  You have worsening numbness or tingling in an arm or a leg.  You have unusual bruising. Get help right away if:  You have signs or symptoms that a blood clot has moved to the lungs. These may include: ? Shortness of breath. ? Chest pain. ? Fast or irregular heartbeats (palpitations). ? Light-headedness or dizziness. ? Coughing up blood.  You have signs or symptoms that your blood is too thin. These may include: ? Blood in your vomit, stool, or urine. ? A cut that will not stop bleeding. ? A menstrual period that is heavier than usual. ? A severe headache or confusion. These symptoms may represent a serious problem that  is an emergency. Do not wait to see if the symptoms will go away. Get medical help right away. Call your local emergency services (911 in the [place] Do not drive yourself to the hospital. Summary  Deep vein thrombosis (DVT) happens when a blood clot forms in a deep vein. This may occur in the lower leg, thigh, pelvis, or arm.  Symptoms affect the arm or leg and can include swelling, pain, tenderness, warmth, redness, or discoloration.  This condition may be treated with medicines or compression stockings. In severe cases, surgery may be done.  If you are taking blood thinners, take them exactly as told. Do not skip a dose. Do not take more than is prescribed.  Get help right away if you have shortness of breath, chest pain, fast or irregular heartbeats, or blood in your vomit, urine, or stool. This information is not intended to replace advice given to you by your health care provider. Make sure you discuss any questions you have with your health care provider. Document Revised: 09/28/2019 Document Reviewed: 09/28/2019 Elsevier Patient Education  2021 [employer]   Inc.  

## 2021-01-12 ENCOUNTER — Encounter: Payer: Self-pay | Admitting: Nurse Practitioner

## 2021-01-12 ENCOUNTER — Other Ambulatory Visit: Payer: Self-pay

## 2021-01-12 ENCOUNTER — Ambulatory Visit: Payer: Managed Care, Other (non HMO) | Admitting: Nurse Practitioner

## 2021-01-12 VITALS — BP 141/92 | HR 75 | Temp 97.1°F | Resp 20 | Ht 68.0 in | Wt 204.0 lb

## 2021-01-12 DIAGNOSIS — F3341 Major depressive disorder, recurrent, in partial remission: Secondary | ICD-10-CM

## 2021-01-12 DIAGNOSIS — I25119 Atherosclerotic heart disease of native coronary artery with unspecified angina pectoris: Secondary | ICD-10-CM | POA: Diagnosis not present

## 2021-01-12 DIAGNOSIS — F411 Generalized anxiety disorder: Secondary | ICD-10-CM

## 2021-01-12 DIAGNOSIS — Z720 Tobacco use: Secondary | ICD-10-CM

## 2021-01-12 DIAGNOSIS — E1165 Type 2 diabetes mellitus with hyperglycemia: Secondary | ICD-10-CM

## 2021-01-12 DIAGNOSIS — I10 Essential (primary) hypertension: Secondary | ICD-10-CM

## 2021-01-12 DIAGNOSIS — K219 Gastro-esophageal reflux disease without esophagitis: Secondary | ICD-10-CM

## 2021-01-12 DIAGNOSIS — J411 Mucopurulent chronic bronchitis: Secondary | ICD-10-CM

## 2021-01-12 DIAGNOSIS — E782 Mixed hyperlipidemia: Secondary | ICD-10-CM | POA: Diagnosis not present

## 2021-01-12 DIAGNOSIS — F5101 Primary insomnia: Secondary | ICD-10-CM

## 2021-01-12 DIAGNOSIS — C50912 Malignant neoplasm of unspecified site of left female breast: Secondary | ICD-10-CM

## 2021-01-12 DIAGNOSIS — E669 Obesity, unspecified: Secondary | ICD-10-CM

## 2021-01-12 DIAGNOSIS — D509 Iron deficiency anemia, unspecified: Secondary | ICD-10-CM

## 2021-01-12 LAB — BAYER DCA HB A1C WAIVED: HB A1C (BAYER DCA - WAIVED): 6.9 % (ref <7.0)

## 2021-01-12 MED ORDER — ZOLPIDEM TARTRATE 10 MG PO TABS: 10.0000 mg | ORAL_TABLET | Freq: Every evening | ORAL | 2 refills | Status: DC | PRN

## 2021-01-12 MED ORDER — ESOMEPRAZOLE MAGNESIUM 40 MG PO CPDR: 40.0000 mg | DELAYED_RELEASE_CAPSULE | Freq: Every day | ORAL | 1 refills | Status: DC

## 2021-01-12 MED ORDER — ROSUVASTATIN CALCIUM 40 MG PO TABS
40.0000 mg | ORAL_TABLET | Freq: Every day | ORAL | 1 refills | Status: DC
Start: 2021-01-12 — End: 2021-04-23

## 2021-01-12 MED ORDER — LORAZEPAM 0.5 MG PO TABS
0.5000 mg | ORAL_TABLET | Freq: Three times a day (TID) | ORAL | 2 refills | Status: DC | PRN
Start: 2021-01-12 — End: 2021-04-23

## 2021-01-12 MED ORDER — FLUOXETINE HCL 40 MG PO CAPS: 80.0000 mg | ORAL_CAPSULE | Freq: Every day | ORAL | 1 refills | Status: DC

## 2021-01-12 MED ORDER — METOPROLOL TARTRATE 50 MG PO TABS: 50.0000 mg | ORAL_TABLET | Freq: Two times a day (BID) | ORAL | 1 refills | Status: DC

## 2021-01-12 MED ORDER — AMLODIPINE BESYLATE 2.5 MG PO TABS
2.5000 mg | ORAL_TABLET | Freq: Every day | ORAL | 1 refills | Status: DC
Start: 2021-01-12 — End: 2021-04-23

## 2021-01-12 MED ORDER — METFORMIN HCL 1000 MG PO TABS
1000.0000 mg | ORAL_TABLET | Freq: Two times a day (BID) | ORAL | 1 refills | Status: DC
Start: 2021-01-12 — End: 2021-04-23

## 2021-01-12 MED ORDER — RYBELSUS 7 MG PO TABS
7.0000 mg | ORAL_TABLET | Freq: Every day | ORAL | 3 refills | Status: DC
Start: 2021-01-12 — End: 2021-04-23

## 2021-01-12 NOTE — Progress Notes (Signed)
Subjective:    Patient ID: Brenda Cruz, female    DOB: December 25, 1965, 55 y.o.   MRN: [id number]   Chief Complaint: medical management of chronic issues     HPI:  1. Essential hypertension No c/o headahce. She has ocasional chest pain and SOB. Her cardiologist is awareof this.  BP Readings from Last 3 Encounters:  01/07/21 135/87  11/05/20 123/75  08/01/20 (!) 149/98     2. Mixed hyperlipidemia Does not watch diet and does no exercise. Sh ewas suppose to have restarted her crestor. She does not take it everyday Lab Results  Component Value Date   CHOL 310 (H) 04/01/2020   HDL 31 (L) 04/01/2020   LDLCALC 212 (H) 04/01/2020   TRIG 324 (H) 04/01/2020   CHOLHDL 10.0 (H) 04/01/2020     3. Coronary artery disease involving native coronary artery of native heart with angina pectoris [facility]) Last saw cardiology on 07/02/19 when  she was having chest pain. He upset her and now does not want yo go back.  4. Tobacco abuse Still smoke about 1 1/2 packs a day.   5. Mucopurulent chronic bronchitis (HCC) Has several exacerbations a year. She knows that smoking aggravates this but refuses to quit smoking.  6. Gastroesophageal reflux disease without esophagitis Takes nexium when needed. She doesnot take every day  7. Type 2 diabetes mellitus with hyperglycemia, without long-term current use of insulin (HCC) She does not watch her diet and does not check her blood sugar very often. She did see the clinical pharmacist on 10/13/20. She had not ben compliant with her medicatons prior to that visit. rybelsus was restarted. She still is not taking like she should. Lab Results  Component Value Date   HGBA1C 8.2 (H) 04/24/2020     8. Recurrent major depressive disorder, in partial remission ([place]) *Has a long history of depression. She has been on prozac for several years. She says she has good days and bad days. Depression screen [facility] 2/9 01/12/2021 11/05/2020 07/02/2020  Decreased Interest  1 1 0  Down, Depressed, Hopeless 2 2 0  PHQ - 2 Score 3 3 0  Altered sleeping 3 3 -  Tired, decreased energy 3 3 -  Change in appetite 0 0 -  Feeling bad or failure about yourself  0 0 -  Trouble concentrating 1 1 -  Moving slowly or fidgety/restless 0 0 -  Suicidal thoughts 0 0 -  PHQ-9 Score 10 10 -  Difficult doing work/chores Not difficult at all Not difficult at all -  Some recent data might be hidden     9. GAD (generalized anxiety disorder) Stays anxious. She has a very stressful job and stays on edge most of the time. She takes her ativan 3x a day. GAD 7 : Generalized Anxiety Score 01/12/2021 11/05/2020 04/01/2020 11/21/2019  Nervous, Anxious, on Edge 3 3 3 2   Control/stop worrying 3 3 3 2   Worry too much - different things 3 3 3 2   Trouble relaxing 1 1 1 1   Restless 0 0 0 0  Easily annoyed or irritable 0 0 1 0  Afraid - awful might happen 1 1 1 1   Total GAD 7 Score 11 11 12 8   Anxiety Difficulty Not difficult at all Not difficult at all Not difficult at all Somewhat difficult     10. Iron deficiency anemia, unspecified iron deficiency anemia type Is suppose to be on daily iron supplment. Lab Results  Component Value Date  HGB 13.5 04/01/2020     11. 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  12. hypomagnesium She has had several infusions of magnesium . She doe snot take daily like she should.   13. Obesity with body mass index of 30.0-39.9 no recent weight changes  Wt Readings from Last 3 Encounters:  01/12/21 204 lb (92.5 kg)  01/07/21 206 lb (93.4 kg)  11/05/20 205 lb (93 kg)   BMI Readings from Last 3 Encounters:  01/12/21 31.02 kg/m  01/07/21 31.32 kg/m  11/05/20 31.17 kg/m      Outpatient Encounter Medications as of 01/12/2021  Medication Sig  . albuterol (VENTOLIN HFA) 108 (90 Base) MCG/ACT inhaler Inhale 2 puffs into the lungs every 4 (four) hours as needed for wheezing or shortness of breath.  Marland Kitchen  amLODipine (NORVASC) 2.5 MG tablet Take 2.5 mg by mouth daily.  Marland Kitchen aspirin EC 81 MG tablet Take 81 mg by mouth every evening.   . blood glucose meter kit and supplies Dispense based on patient and insurance preference. Use up to four times daily as directed. (FOR ICD-10 E10.9, E11.9).  . cyclobenzaprine (FLEXERIL) 10 MG tablet Take 1 tablet (10 mg total) by mouth 3 (three) times daily as needed for muscle spasms.  . diphenhydrAMINE (BENADRYL) 25 MG tablet Take 25 mg by mouth every 6 (six) hours as needed for itching or allergies.  Marland Kitchen esomeprazole (NEXIUM) 40 MG capsule Take 1 capsule (40 mg total) by mouth daily.  Marland Kitchen FLUoxetine (PROZAC) 40 MG capsule TAKE ONE (1) CAPSULE EACH DAY  . HYDROcodone-acetaminophen (NORCO/VICODIN) 5-325 MG tablet Take 1 tablet by mouth every 6 (six) hours as needed for moderate pain.  Marland Kitchen ipratropium-albuterol (DUONEB) 0.5-2.5 (3) MG/3ML SOLN Take 3 mLs by nebulization every 6 (six) hours as needed for Wheezing.  Marland Kitchen LORazepam (ATIVAN) 0.5 MG tablet Take 1 tablet (0.5 mg total) by mouth every 8 (eight) hours as needed for anxiety.  . magnesium oxide (MAG-OX) 400 MG tablet Take by mouth.  . metFORMIN (GLUCOPHAGE) 1000 MG tablet Take 1,000 mg by mouth 2 (two) times daily.  . metoprolol tartrate (LOPRESSOR) 50 MG tablet Take 1 tablet (50 mg total) by mouth 2 (two) times daily.  . Multiple Vitamin (MULTIVITAMIN WITH MINERALS) TABS tablet Take 1 tablet by mouth at bedtime.   . nitroGLYCERIN (NITROSTAT) 0.4 MG SL tablet Place 1 tablet (0.4 mg total) under the tongue every 5 (five) minutes as needed for chest pain.  Marland Kitchen ondansetron (ZOFRAN ODT) 8 MG disintegrating tablet Take 1 tablet (8 mg total) by mouth every 8 (eight) hours as needed for nausea or vomiting.  . Probiotic Product (RA PROBIOTIC GUMMIES) CHEW Chew 1 each by mouth at bedtime.   . rosuvastatin (CRESTOR) 40 MG tablet Take 1 tablet (40 mg total) by mouth daily.  . Semaglutide (RYBELSUS) 7 MG TABS Take 7 mg by mouth daily.    No facility-administered encounter medications on file as of 01/12/2021.    Past Surgical History:  Procedure Laterality Date  . CARDIAC CATHETERIZATION N/A 10/29/2015   Procedure: Left Heart Cath and Coronary Angiography;  Surgeon: [name], MD;  Location: [place] [facility];  Service: Cardiovascular;  Laterality: N/A;  . CHOLECYSTECTOMY    . CORONARY ARTERY BYPASS GRAFT N/A 11/02/2015   Procedure: CORONARY ARTERY BYPASS GRAFTING (CABG)x 1 using left internal mammary artery.;  Surgeon: Melrose Nakayama, MD;  Location: [place];  Service: Open Heart Surgery;  Laterality: N/A;  .  LEFT OOPHORECTOMY Left    Related to tubal torsion.  Bilateral salpingectomy also.  Remaining uterus and right ovary  . LUMBAR FUSION    . TEE WITHOUT CARDIOVERSION N/A 11/02/2015   Procedure: TRANSESOPHAGEAL ECHOCARDIOGRAM (TEE);  Surgeon: Melrose Nakayama, MD;  Location: [place];  Service: Open Heart Surgery;  Laterality: N/A;    Family History  Problem Relation Age of Onset  . Colon polyps Father   . Stomach cancer Paternal Grandmother   . Esophageal cancer Maternal Grandfather   . Breast cancer Mother   . Ovarian cancer Other        Maternal Side Great  Aunt  . Colon cancer Neg Hx     New complaints: Not sleeping well at all. Only sleeps 3-4 hours at a time. Feels tired all the time. The last 2 weeks she has not been able to keep up with her work.  Social history: Lives with her husband and son  Controlled substance contract: 04/03/20    Review of Systems  Constitutional: Negative for diaphoresis.  Eyes: Negative for pain.  Respiratory: Negative for shortness of breath.   Cardiovascular: Negative for chest pain, palpitations and leg swelling.  Gastrointestinal: Negative for abdominal pain.  Endocrine: Negative for polydipsia.  Skin: Negative for rash.  Neurological: Negative for dizziness, weakness and headaches.  Hematological: Does not bruise/bleed easily.  All other systems  reviewed and are negative.      Objective:   Physical Exam Vitals and nursing note reviewed.  Constitutional:      General: She is not in acute distress.    Appearance: Normal appearance. She is well-developed.  HENT:     Head: Normocephalic.     Nose: Nose normal.  Eyes:     Pupils: Pupils are equal, round, and reactive to light.  Neck:     Vascular: No carotid bruit or JVD.  Cardiovascular:     Rate and Rhythm: Normal rate and regular rhythm.     Heart sounds: Normal heart sounds.  Pulmonary:     Effort: Pulmonary effort is normal. No respiratory distress.     Breath sounds: Normal breath sounds. No wheezing or rales.  Chest:     Chest wall: No tenderness.  Abdominal:     General: Bowel sounds are normal. There is no distension or abdominal bruit.     Palpations: Abdomen is soft. There is no hepatomegaly, splenomegaly, mass or pulsatile mass.     Tenderness: There is no abdominal tenderness.  Musculoskeletal:        General: Normal range of motion.     Cervical back: Normal range of motion and neck supple.  Lymphadenopathy:     Cervical: No cervical adenopathy.  Skin:    General: Skin is warm and dry.  Neurological:     Mental Status: She is alert and oriented to person, place, and time.     Deep Tendon Reflexes: Reflexes are normal and symmetric.  Psychiatric:        Behavior: Behavior normal.        Thought Content: Thought content normal.        Judgment: Judgment normal.    BP (!) 141/92   Pulse 75   Temp (!) 97.1 F (36.2 C) (Temporal)   Resp 20   Ht 5' 8"  (1.727 m)   Wt 204 lb (92.5 kg)   SpO2 97%   BMI 31.02 kg/m   HGBBA1c 6.9%      Assessment & Plan:  Brenda Quest  Cruz comes in today with chief complaint of Medical Management of Chronic Issues   Diagnosis and orders addressed:  1. Essential hypertension Low sodium diet - CBC with Differential/Platelet - CMP14+EGFR - metoprolol tartrate (LOPRESSOR) 50 MG tablet; Take 1 tablet (50 mg total)  by mouth 2 (two) times daily.  Dispense: 180 tablet; Refill: 1 - amLODipine (NORVASC) 2.5 MG tablet; Take 1 tablet (2.5 mg total) by mouth daily.  Dispense: 90 tablet; Refill: 1  2. Mixed hyperlipidemia Low fat diet - Lipid panel - rosuvastatin (CRESTOR) 40 MG tablet; Take 1 tablet (40 mg total) by mouth daily.  Dispense: 90 tablet; Refill: 1  3. Coronary artery disease involving native coronary artery of native heart with angina pectoris ([place]) Will not see cardiology  4. Tobacco abuse Refuses to stop smoking  5. Mucopurulent chronic bronchitis ([place]) Really needs to stop smoking  6. Gastroesophageal reflux disease without esophagitis Avoid spicy foods Do not eat 2 hours prior to bedtime - esomeprazole (NEXIUM) 40 MG capsule; Take 1 capsule (40 mg total) by mouth daily.  Dispense: 90 capsule; Refill: 1  7. Type 2 diabetes mellitus with hyperglycemia, without long-term current use of insulin (HCC) Need to take meds daily - Bayer DCA Hb A1c Waived - Microalbumin / creatinine urine ratio - metFORMIN (GLUCOPHAGE) 1000 MG tablet; Take 1 tablet (1,000 mg total) by mouth 2 (two) times daily.  Dispense: 180 tablet; Refill: 1 - Semaglutide (RYBELSUS) 7 MG TABS; Take 7 mg by mouth daily.  Dispense: 30 tablet; Refill: 3  8. Recurrent major depressive disorder, in partial remission (HCC) Increase prozac to 9m daily - FLUoxetine (PROZAC) 40 MG capsule; Take 2 capsules (80 mg total) by mouth daily. TAKE ONE (1) CAPSULE EACH DAY  Dispense: 180 capsule; Refill: 1  9. GAD (generalized anxiety disorder) Stress management - LORazepam (ATIVAN) 0.5 MG tablet; Take 1 tablet (0.5 mg total) by mouth every 8 (eight) hours as needed for anxiety.  Dispense: 90 tablet; Refill: 2  10. Iron deficiency anemia, unspecified iron deficiency anemia type Labs oending - Magnesium  11. Infiltrating ductal carcinoma of left breast ([place] Had breast exam and mammogram and were all good  12. Obesity with body  mass index of 30.0-39.9 Watch carbs in diet  13. Hypomagnesemia Labs pending  14. Primary insomnia Bedtime routine - zolpidem (AMBIEN) 10 MG tablet; Take 1 tablet (10 mg total) by mouth at bedtime as needed for sleep.  Dispense: 30 tablet; Refill: 2   Labs pending Health Maintenance reviewed Diet and exercise encouraged  Follow up plan: 3 months   [name] MHassell Done [name]

## 2021-01-12 NOTE — Patient Instructions (Signed)

## 2021-01-13 LAB — CBC WITH DIFFERENTIAL/PLATELET
Basophils Absolute: 0.1 10*3/uL (ref 0.0–0.2)
Basos: 1 %
EOS (ABSOLUTE): 0.2 10*3/uL (ref 0.0–0.4)
Eos: 2 %
Hematocrit: 39.7 % (ref 34.0–46.6)
Hemoglobin: 13.2 g/dL (ref 11.1–15.9)
Immature Grans (Abs): 0.1 10*3/uL (ref 0.0–0.1)
Immature Granulocytes: 1 %
Lymphocytes Absolute: 2.5 10*3/uL (ref 0.7–3.1)
Lymphs: 27 %
MCH: 30.1 pg (ref 26.6–33.0)
MCHC: 33.2 g/dL (ref 31.5–35.7)
MCV: 90 fL (ref 79–97)
Monocytes Absolute: 0.7 10*3/uL (ref 0.1–0.9)
Monocytes: 8 %
Neutrophils Absolute: 5.6 10*3/uL (ref 1.4–7.0)
Neutrophils: 61 %
Platelets: 251 10*3/uL (ref 150–450)
RBC: 4.39 x10E6/uL (ref 3.77–5.28)
RDW: 12.2 % (ref 11.7–15.4)
WBC: 9.1 10*3/uL (ref 3.4–10.8)

## 2021-01-13 LAB — LIPID PANEL
Chol/HDL Ratio: 11.1 ratio — ABNORMAL HIGH (ref 0.0–4.4)
Cholesterol, Total: 300 mg/dL — ABNORMAL HIGH (ref 100–199)
HDL: 27 mg/dL — ABNORMAL LOW (ref >39)
LDL Chol Calc (NIH): 176 mg/dL — ABNORMAL HIGH (ref 0–99)
Triglycerides: 480 mg/dL — ABNORMAL HIGH (ref 0–149)
VLDL Cholesterol Cal: 97 mg/dL — ABNORMAL HIGH (ref 5–40)

## 2021-01-13 LAB — CMP14+EGFR
ALT: 49 IU/L — ABNORMAL HIGH (ref 0–32)
AST: 47 IU/L — ABNORMAL HIGH (ref 0–40)
Albumin/Globulin Ratio: 1.6 (ref 1.2–2.2)
Albumin: 4.2 g/dL (ref 3.8–4.9)
Alkaline Phosphatase: 87 IU/L (ref 44–121)
BUN/Creatinine Ratio: 7 — ABNORMAL LOW (ref 9–23)
BUN: 7 mg/dL (ref 6–24)
Bilirubin Total: 0.3 mg/dL (ref 0.0–1.2)
CO2: 24 mmol/L (ref 20–29)
Calcium: 9.2 mg/dL (ref 8.7–10.2)
Chloride: 101 mmol/L (ref 96–106)
Creatinine, Ser: 1.04 mg/dL — ABNORMAL HIGH (ref 0.57–1.00)
Globulin, Total: 2.6 g/dL (ref 1.5–4.5)
Glucose: 182 mg/dL — ABNORMAL HIGH (ref 65–99)
Potassium: 4.7 mmol/L (ref 3.5–5.2)
Sodium: 140 mmol/L (ref 134–144)
Total Protein: 6.8 g/dL (ref 6.0–8.5)
eGFR: 65 mL/min/{1.73_m2} (ref >59)

## 2021-01-13 LAB — MICROALBUMIN / CREATININE URINE RATIO
Creatinine, Urine: 191.9 mg/dL
Microalb/Creat Ratio: 18 mg/g creat (ref 0–29)
Microalbumin, Urine: 33.7 ug/mL

## 2021-01-13 LAB — MAGNESIUM: Magnesium: 1.5 mg/dL — ABNORMAL LOW (ref 1.6–2.3)

## 2021-01-13 MED ORDER — MAGNESIUM 30 MG PO TABS: 30.0000 mg | ORAL_TABLET | Freq: Two times a day (BID) | ORAL | 1 refills | Status: DC

## 2021-01-13 NOTE — Addendum Note (Signed)
Addended by: [name] on: 01/13/2021 01:50 PM   Modules accepted: Orders

## 2021-02-23 ENCOUNTER — Telehealth: Payer: Self-pay

## 2021-02-23 ENCOUNTER — Ambulatory Visit: Payer: Managed Care, Other (non HMO) | Admitting: Nurse Practitioner

## 2021-02-23 DIAGNOSIS — J069 Acute upper respiratory infection, unspecified: Secondary | ICD-10-CM | POA: Diagnosis not present

## 2021-02-23 LAB — VERITOR FLU A/B WAIVED
Influenza A: NEGATIVE
Influenza B: NEGATIVE

## 2021-02-23 MED ORDER — AZITHROMYCIN 250 MG PO TABS
ORAL_TABLET | ORAL | 0 refills | Status: AC
Start: 2021-02-23 — End: 2021-02-28

## 2021-02-23 MED ORDER — BENZONATATE 100 MG PO CAPS: 100.0000 mg | ORAL_CAPSULE | Freq: Three times a day (TID) | ORAL | 0 refills | Status: DC | PRN

## 2021-02-23 NOTE — Telephone Encounter (Signed)
Je, please advise.  There is not a letter up front or in patient's chart.  I can do it if you will let me know when she can return to work.

## 2021-02-23 NOTE — Patient Instructions (Signed)
Upper Respiratory Infection, Adult An upper respiratory infection (URI) is a common viral infection of the nose, throat, and upper air passages that lead to the lungs. The most common type of URI is the common cold. URIs usually get better on their own, without medical treatment. What are the causes? A URI is caused by a virus. You may catch a virus by:  Breathing in droplets from an infected person's cough or sneeze.  Touching something that has been exposed to the virus (contaminated) and then touching your mouth, nose, or eyes. What increases the risk? You are more likely to get a URI if:  You are very young or very old.  It is autumn or winter.  You have close contact with others, such as at a daycare, school, or health care facility.  You smoke.  You have long-term (chronic) heart or lung disease.  You have a weakened disease-fighting (immune) system.  You have nasal allergies or asthma.  You are experiencing a lot of stress.  You work in an area that has poor air circulation.  You have poor nutrition. What are the signs or symptoms? A URI usually involves some of the following symptoms:  Runny or stuffy (congested) nose.  Sneezing.  Cough.  Sore throat.  Headache.  Fatigue.  Fever.  Loss of appetite.  Pain in your forehead, behind your eyes, and over your cheekbones (sinus pain).  Muscle aches.  Redness or irritation of the eyes.  Pressure in the ears or face. How is this diagnosed? This condition may be diagnosed based on your medical history and symptoms, and a physical exam. Your health care provider may use a cotton swab to take a mucus sample from your nose (nasal swab). This sample can be tested to determine what virus is causing the illness. How is this treated? URIs usually get better on their own within 7-10 days. You can take steps at home to relieve your symptoms. Medicines cannot cure URIs, but your health care provider may recommend  certain medicines to help relieve symptoms, such as:  Over-the-counter cold medicines.  Cough suppressants. Coughing is a type of defense against infection that helps to clear the respiratory system, so take these medicines only as recommended by your health care provider.  Fever-reducing medicines. Follow these instructions at home: Activity  Rest as needed.  If you have a fever, stay home from work or school until your fever is gone or until your health care provider says you are no longer contagious. Your health care provider may have you wear a face mask to prevent your infection from spreading. Relieving symptoms  Gargle with a salt-water mixture 3-4 times a day or as needed. To make a salt-water mixture, completely dissolve -1 tsp of salt in 1 cup of warm water.  Use a cool-mist humidifier to add moisture to the air. This can help you breathe more easily. Eating and drinking  Drink enough fluid to keep your urine pale yellow.  Eat soups and other clear broths.   General instructions  Take over-the-counter and prescription medicines only as told by your health care provider. These include cold medicines, fever reducers, and cough suppressants.  Do not use any products that contain nicotine or tobacco, such as cigarettes and e-cigarettes. If you need help quitting, ask your health care provider.  Stay away from secondhand smoke.  Stay up to date on all immunizations, including the yearly (annual) flu vaccine.  Keep all follow-up visits as told by your health  care provider. This is important.   How to prevent the spread of infection to others  URIs can be passed from person to person (are contagious). To prevent the infection from spreading: ? Wash your hands often with soap and water. If soap and water are not available, use hand sanitizer. ? Avoid touching your mouth, face, eyes, or nose. ? Cough or sneeze into a tissue or your sleeve or elbow instead of into your hand or  into the air.   Contact a health care provider if:  You are getting worse instead of better.  You have a fever or chills.  Your mucus is brown or red.  You have yellow or brown discharge coming from your nose.  You have pain in your face, especially when you bend forward.  You have swollen neck glands.  You have pain while swallowing.  You have white areas in the back of your throat. Get help right away if:  You have shortness of breath that gets worse.  You have severe or persistent: ? Headache. ? Ear pain. ? Sinus pain. ? Chest pain.  You have chronic lung disease along with any of the following: ? Wheezing. ? Prolonged cough. ? Coughing up blood. ? A change in your usual mucus.  You have a stiff neck.  You have changes in your: ? Vision. ? Hearing. ? Thinking. ? Mood. Summary  An upper respiratory infection (URI) is a common infection of the nose, throat, and upper air passages that lead to the lungs.  A URI is caused by a virus.  URIs usually get better on their own within 7-10 days.  Medicines cannot cure URIs, but your health care provider may recommend certain medicines to help relieve symptoms. This information is not intended to replace advice given to you by your health care provider. Make sure you discuss any questions you have with your health care provider. Document Revised: 06/11/2020 Document Reviewed: 06/11/2020 Elsevier Patient Education  [place].

## 2021-02-23 NOTE — Assessment & Plan Note (Signed)
Take medication as prescribed Increase hydration Cool-mist humidifier Tylenol/ibuprofen for headache, body ache and fever Azithromycin 250 mg tablet by mouth.  500 mg day 1, 200 mg day 2-5. Tessalon Perles 100 mg tablet by mouth for cough Rx sent to pharmacy

## 2021-02-23 NOTE — Telephone Encounter (Signed)
Patient had phone visit today and will need a work note pending results of flu test.  Also, please make sure someone calls her with her results.  Her son was seen yesterday and was positive for flu but never received a phone call to inform her, she called this morning and found out.

## 2021-02-23 NOTE — Telephone Encounter (Signed)
Patient aware.

## 2021-02-23 NOTE — Progress Notes (Signed)
   Virtual Visit  Note Due to COVID-19 pandemic this visit was conducted virtually. This visit type was conducted due to national recommendations for restrictions regarding the COVID-19 Pandemic (e.g. social distancing, sheltering in place) in an effort to limit this patient's exposure and mitigate transmission in our community. All issues noted in this document were discussed and addressed.  A physical exam was not performed with this format.  I connected with Brenda Cruz on 02/23/21 at  11:09 AM by telephone and verified that I am speaking with the correct person using two identifiers. Brenda Cruz is currently located at home during visit. The provider, Ivy Lynn, NP is located in their office at time of visit.  I discussed the limitations, risks, security and privacy concerns of performing an evaluation and management service by telephone and the availability of in person appointments. I also discussed with the patient that there may be a patient responsible charge related to this service. The patient expressed understanding and agreed to proceed.   History and Present Illness:  URI  This is a new problem. The current episode started yesterday. The problem has been gradually worsening. Maximum temperature: Low-grade fever 99.5. Associated symptoms include coughing, headaches, nausea, sneezing and a sore throat. Pertinent negatives include no ear pain or rash. She has tried nothing for the symptoms.      Review of Systems  Constitutional: Positive for fever and malaise/fatigue.  HENT: Positive for sneezing and sore throat. Negative for ear pain.   Respiratory: Positive for cough.   Gastrointestinal: Positive for nausea.  Skin: Negative for rash.  Neurological: Positive for headaches.  All other systems reviewed and are negative.    Observations/Objective: Televisit patient did not sound to be in distress.  Assessment and Plan: Take medication as prescribed Increase  hydration Cool-mist humidifier Tylenol/ibuprofen for headache, body ache and fever Azithromycin 250 mg tablet by mouth.  500 mg day 1, 200 mg day 2-5. Tessalon Perles 100 mg tablet by mouth for cough Rx sent to pharmacy   Follow Up Instructions: Follow-up with worsening unresolved symptoms.    I discussed the assessment and treatment plan with the patient. The patient was provided an opportunity to ask questions and all were answered. The patient agreed with the plan and demonstrated an understanding of the instructions.   The patient was advised to call back or seek an in-person evaluation if the symptoms worsen or if the condition fails to improve as anticipated.  The above assessment and management plan was discussed with the patient. The patient verbalized understanding of and has agreed to the management plan. Patient is aware to call the clinic if symptoms persist or worsen. Patient is aware when to return to the clinic for a follow-up visit. Patient educated on when it is appropriate to go to the emergency department.   Time call ended: 11:19 AM  I provided 10 minutes of  non face-to-face time during this encounter.    Ivy Lynn, NP

## 2021-02-23 NOTE — Telephone Encounter (Signed)
Je spoke to pt over the telephone

## 2021-02-26 ENCOUNTER — Telehealth: Payer: Self-pay

## 2021-03-01 ENCOUNTER — Ambulatory Visit: Payer: Managed Care, Other (non HMO) | Admitting: Nurse Practitioner

## 2021-03-01 ENCOUNTER — Encounter: Payer: Self-pay | Admitting: Nurse Practitioner

## 2021-03-01 ENCOUNTER — Ambulatory Visit (INDEPENDENT_AMBULATORY_CARE_PROVIDER_SITE_OTHER): Payer: Managed Care, Other (non HMO)

## 2021-03-01 VITALS — BP 159/82 | HR 78 | Temp 96.7°F | Resp 20 | Ht 68.0 in | Wt 204.0 lb

## 2021-03-01 DIAGNOSIS — J189 Pneumonia, unspecified organism: Secondary | ICD-10-CM | POA: Diagnosis not present

## 2021-03-01 DIAGNOSIS — R059 Cough, unspecified: Secondary | ICD-10-CM | POA: Diagnosis not present

## 2021-03-01 MED ORDER — HYDROCODONE BIT-HOMATROP MBR 5-1.5 MG/5ML PO SOLN: 5.0000 mL | Freq: Four times a day (QID) | ORAL | 0 refills | Status: DC | PRN

## 2021-03-01 MED ORDER — AZITHROMYCIN 250 MG PO TABS: ORAL_TABLET | ORAL | 0 refills | Status: DC

## 2021-03-01 MED ORDER — PREDNISONE 20 MG PO TABS: 40.0000 mg | ORAL_TABLET | Freq: Every day | ORAL | 0 refills | Status: AC

## 2021-03-01 NOTE — Progress Notes (Signed)
Subjective:    Patient ID: Brenda Cruz, female    DOB: 1965-11-12, 55 y.o.   MRN: [id number]   Chief Complaint: Cough   HPI Patient had telephone appointment last Tuesday because her son tested positive for flu on Monday. She came in for flu test which was negative. Covid test at work was negative. She is c/o increasing cough with SOB. Had fever of 101 and higher  over weekend. Today has been running around 100. She feels like she has a mucuc plug in her chest that she needs to get out.   Review of Systems  Constitutional: Positive for chills, fatigue and fever.  HENT: Positive for rhinorrhea and voice change.   Respiratory: Positive for cough, chest tightness and shortness of breath.   Gastrointestinal: Negative.   Genitourinary: Negative.   Musculoskeletal: Negative.   Psychiatric/Behavioral: Negative.   All other systems reviewed and are negative.      Objective:   Physical Exam Vitals and nursing note reviewed.  Constitutional:      Appearance: Normal appearance.  Cardiovascular:     Rate and Rhythm: Normal rate and regular rhythm.     Heart sounds: Normal heart sounds.  Pulmonary:     Breath sounds: Wheezing (exp throughout with insp in left lower lobe) present.  Skin:    General: Skin is warm.  Neurological:     General: No focal deficit present.     Mental Status: She is alert and oriented to person, place, and time.  Psychiatric:        Mood and Affect: Mood normal.        Behavior: Behavior normal.    BP (!) 159/82   Pulse 78   Temp (!) 96.7 F (35.9 C) (Temporal)   Resp 20   Ht 5\' 8"  (1.727 m)   Wt 204 lb (92.5 kg)   SpO2 97%   BMI 31.02 kg/m     Chest xray- questionable early left lower lobe pneumonia-Preliminary reading by Ronnald Collum, [name]  [facility]     Assessment & Plan:  Brenda Cruz in today with chief complaint of Cough   1. Cough, unspecified - DG Chest 2 View; Future  2. Pneumonia of left lower lobe due to infectious  organism 1. Take meds as prescribed 2. Use a cool mist humidifier especially during the winter months and when heat has been humid. 3. Use saline nose sprays frequently 4. Saline irrigations of the nose can be very helpful if done frequently.  * 4X daily for 1 week*  * Use of a nettie pot can be helpful with this. Follow directions with this* 5. Drink plenty of fluids 6. Keep thermostat turn down low 7.For any cough or congestion  Use plain Mucinex- regular strength or max strength is fine   * Children- consult with Pharmacist for dosing 8. For fever or aces or pains- take tylenol or ibuprofen appropriate for age and weight.  * for fevers greater than 101 orally you may alternate ibuprofen and tylenol every  3 hours.    - predniSONE (DELTASONE) 20 MG tablet; Take 2 tablets (40 mg total) by mouth daily with breakfast for 5 days. 2 po daily for 5 days  Dispense: 10 tablet; Refill: 0 - azithromycin (ZITHROMAX Z-PAK) 250 MG tablet; As directed  Dispense: 6 tablet; Refill: 0 - HYDROcodone bit-homatropine (HYCODAN) 5-1.5 MG/5ML syrup; Take 5 mLs by mouth every 6 (six) hours as needed for cough.  Dispense: 120 mL; Refill: 0  The above assessment and management plan was discussed with the patient. The patient verbalized understanding of and has agreed to the management plan. Patient is aware to call the clinic if symptoms persist or worsen. Patient is aware when to return to the clinic for a follow-up visit. Patient educated on when it is appropriate to go to the emergency department.   [name] Dillard Pascal, [name]   

## 2021-03-01 NOTE — Telephone Encounter (Signed)
Patient still not feeling well, has chest congestion, cough, and not feeling any better.  She is concerned she may have developed pneumonia.  Appointment scheduled today at 4:30 pm with [name].

## 2021-03-01 NOTE — Patient Instructions (Signed)

## 2021-04-06 ENCOUNTER — Other Ambulatory Visit: Payer: Self-pay | Admitting: Nurse Practitioner

## 2021-04-06 DIAGNOSIS — E1140 Type 2 diabetes mellitus with diabetic neuropathy, unspecified: Secondary | ICD-10-CM

## 2021-04-15 ENCOUNTER — Ambulatory Visit: Payer: Self-pay | Admitting: Nurse Practitioner

## 2021-04-23 ENCOUNTER — Ambulatory Visit: Payer: Managed Care, Other (non HMO) | Admitting: Nurse Practitioner

## 2021-04-23 ENCOUNTER — Encounter: Payer: Self-pay | Admitting: Nurse Practitioner

## 2021-04-23 ENCOUNTER — Other Ambulatory Visit: Payer: Self-pay

## 2021-04-23 DIAGNOSIS — F3341 Major depressive disorder, recurrent, in partial remission: Secondary | ICD-10-CM

## 2021-04-23 DIAGNOSIS — J069 Acute upper respiratory infection, unspecified: Secondary | ICD-10-CM

## 2021-04-23 DIAGNOSIS — I25119 Atherosclerotic heart disease of native coronary artery with unspecified angina pectoris: Secondary | ICD-10-CM

## 2021-04-23 DIAGNOSIS — E782 Mixed hyperlipidemia: Secondary | ICD-10-CM

## 2021-04-23 DIAGNOSIS — K219 Gastro-esophageal reflux disease without esophagitis: Secondary | ICD-10-CM

## 2021-04-23 DIAGNOSIS — J411 Mucopurulent chronic bronchitis: Secondary | ICD-10-CM

## 2021-04-23 DIAGNOSIS — N393 Stress incontinence (female) (male): Secondary | ICD-10-CM

## 2021-04-23 DIAGNOSIS — Z720 Tobacco use: Secondary | ICD-10-CM

## 2021-04-23 DIAGNOSIS — I10 Essential (primary) hypertension: Secondary | ICD-10-CM | POA: Diagnosis not present

## 2021-04-23 DIAGNOSIS — E1165 Type 2 diabetes mellitus with hyperglycemia: Secondary | ICD-10-CM | POA: Diagnosis not present

## 2021-04-23 DIAGNOSIS — F411 Generalized anxiety disorder: Secondary | ICD-10-CM

## 2021-04-23 DIAGNOSIS — N6320 Unspecified lump in the left breast, unspecified quadrant: Secondary | ICD-10-CM

## 2021-04-23 DIAGNOSIS — E669 Obesity, unspecified: Secondary | ICD-10-CM

## 2021-04-23 DIAGNOSIS — D508 Other iron deficiency anemias: Secondary | ICD-10-CM

## 2021-04-23 LAB — BAYER DCA HB A1C WAIVED: HB A1C (BAYER DCA - WAIVED): 7 % — ABNORMAL HIGH (ref <7.0)

## 2021-04-23 MED ORDER — LORAZEPAM 0.5 MG PO TABS: 0.5000 mg | ORAL_TABLET | Freq: Three times a day (TID) | ORAL | 2 refills | Status: DC | PRN

## 2021-04-23 MED ORDER — ESOMEPRAZOLE MAGNESIUM 40 MG PO CPDR: 40.0000 mg | DELAYED_RELEASE_CAPSULE | Freq: Every day | ORAL | 1 refills | Status: DC

## 2021-04-23 MED ORDER — FLUOXETINE HCL 40 MG PO CAPS: 80.0000 mg | ORAL_CAPSULE | Freq: Every day | ORAL | 1 refills | Status: DC

## 2021-04-23 MED ORDER — METOPROLOL TARTRATE 50 MG PO TABS: 50.0000 mg | ORAL_TABLET | Freq: Two times a day (BID) | ORAL | 1 refills | Status: DC

## 2021-04-23 MED ORDER — ROSUVASTATIN CALCIUM 40 MG PO TABS: 40.0000 mg | ORAL_TABLET | Freq: Every day | ORAL | 1 refills | Status: DC

## 2021-04-23 MED ORDER — METFORMIN HCL 1000 MG PO TABS: 1000.0000 mg | ORAL_TABLET | Freq: Two times a day (BID) | ORAL | 1 refills | Status: DC

## 2021-04-23 NOTE — Progress Notes (Signed)
Subjective:    Patient ID: Brenda Cruz, female    DOB: [birth year], 55 y.o.   MRN: [id number]  Chief Complaint: medical management of chronic issues     HPI:  1. Essential hypertension No c/o chest pain or headache. She has SOB all the time. She does not check her blood pressure at home. She is very anxious today/. BP Readings from Last 3 Encounters:  04/23/21 (!) 178/95  03/01/21 (!) 159/82  01/12/21 (!) 141/92    2. Coronary artery disease involving native coronary artery of native heart with angina pectoris [facility]) She has not followed up with cardiology in over a year. She had CABG in 2017  3. Type 2 diabetes mellitus with hyperglycemia, without long-term current use of insulin (HCC) She does not watch her diet at all. Fasting blood sugars are running around 120. She has had no low blood sugars. Lab Results  Component Value Date   HGBA1C 6.9 01/12/2021      4. Mixed hyperlipidemia Again she does not watch diet and does no dedicated exercise.SHe has not been taking her statins. She said last time she tried to go back on it she had palpitations. She was on crestor 37m. Lab Results  Component Value Date   CHOL 300 (H) 01/12/2021   HDL 27 (L) 01/12/2021   LDLCALC 176 (H) 01/12/2021   TRIG 480 (H) 01/12/2021   CHOLHDL 11.1 (H) 01/12/2021     5. Mucopurulent chronic bronchitis ([place] She has chronic bronchitis. Stays SOB. She is not on a maintenance inhaler.   6. Tobacco abuse Smokes over a pack a day. Has no desire to stop smoking.  7. Upper respiratory infection with cough and congestion She has had covid 3x and multiple resp infection over the last year. She is better today. Just recently finished a zpak  8. Gastroesophageal reflux disease without esophagitis Is on nexium daily , which is working well to keep her symptoms under control.  9. Recurrent major depressive disorder, in partial remission ([facility] Is on prozac and is doing ok [facility] screen [facility]/9  04/23/2021 01/12/2021 11/05/2020  Decreased Interest _0 Down, Depressed, Hopeless _1 PHQ - 2 Score _2 Altered sleeping _3 Tired, decreased energy _4 Change in appetite 2 0 0  Feeling bad or failure about yourself  0 0 0  Trouble concentrating 0 1 1  Moving slowly or fidgety/restless 0 0 0  Suicidal thoughts 0 0 0  PHQ-9 Score _5 Difficult doing work/chores Somewhat difficult Not difficult at all Not difficult at all  Some recent data might be hidden     10. GAD (generalized anxiety disorder) Is on ativan TID. She stays very anxious. We have tried weaning her down and have not been able to. GAD 7 : Generalized Anxiety Score 04/23/2021 01/12/2021 11/05/2020 04/01/2020  Nervous, Anxious, on Edge _6 Control/stop worrying _7 Worry too much - different things _8 Trouble relaxing _9 Restless 0 0 0 0  Easily annoyed or irritable 1 0 0 1  Afraid - awful might happen _10 Total GAD 7 Score _11 Anxiety Difficulty Somewhat difficult Not difficult at all Not difficult at all Not difficult at all     11. Iron deficiency anemia secondary to inadequate dietary iron intake No  c/o fatigue Lab Results  Component Value Date   HGB 13.2 01/12/2021     12. Stress incontinence of urine Mainly when she is coughing or laughing  13. BMI 30-30.9 Wt Readings from Last 3 Encounters:  04/23/21 203 lb (92.1 kg)  03/01/21 204 lb (92.5 kg)  01/12/21 204 lb (92.5 kg)   BMI Readings from Last 3 Encounters:  04/23/21 30.87 kg/m  03/01/21 31.02 kg/m  01/12/21 31.02 kg/m      Outpatient Encounter Medications as of 04/23/2021  Medication Sig   albuterol (VENTOLIN HFA) 108 (90 Base) MCG/ACT inhaler Inhale 2 puffs into the lungs every 4 (four) hours as needed for wheezing or shortness of breath.   amLODipine (NORVASC) 2.5 MG tablet Take 1 tablet (2.5 mg total) by mouth daily.   aspirin EC 81 MG tablet Take 81 mg by mouth every evening.     azithromycin (ZITHROMAX Z-PAK) 250 MG tablet As directed   benzonatate (TESSALON PERLES) 100 MG capsule Take 1 capsule (100 mg total) by mouth 3 (three) times daily as needed for cough.   blood glucose meter kit and supplies Dispense based on patient and insurance preference. Use up to four times daily as directed. (FOR ICD-10 E10.9, E11.9).   cyclobenzaprine (FLEXERIL) 10 MG tablet Take 1 tablet (10 mg total) by mouth 3 (three) times daily as needed for muscle spasms.   diphenhydrAMINE (BENADRYL) 25 MG tablet Take 25 mg by mouth every 6 (six) hours as needed for itching or allergies.   esomeprazole (NEXIUM) 40 MG capsule Take 1 capsule (40 mg total) by mouth daily.   FLUoxetine (PROZAC) 40 MG capsule Take 2 capsules (80 mg total) by mouth daily. TAKE ONE (1) CAPSULE EACH DAY   HYDROcodone bit-homatropine (HYCODAN) 5-1.5 MG/5ML syrup Take 5 mLs by mouth every 6 (six) hours as needed for cough.   HYDROcodone-acetaminophen (NORCO/VICODIN) 5-325 MG tablet Take 1 tablet by mouth every 6 (six) hours as needed for moderate pain.   ipratropium-albuterol (DUONEB) 0.5-2.5 (3) MG/3ML SOLN Take 3 mLs by nebulization every 6 (six) hours as needed for Wheezing.   LORazepam (ATIVAN) 0.5 MG tablet Take 1 tablet (0.5 mg total) by mouth every 8 (eight) hours as needed for anxiety.   magnesium 30 MG tablet Take 1 tablet (30 mg total) by mouth 2 (two) times daily.   magnesium oxide (MAG-OX) 400 MG tablet Take by mouth.   metFORMIN (GLUCOPHAGE) 1000 MG tablet Take 1 tablet (1,000 mg total) by mouth 2 (two) times daily.   metoprolol tartrate (LOPRESSOR) 50 MG tablet Take 1 tablet (50 mg total) by mouth 2 (two) times daily.   Multiple Vitamin (MULTIVITAMIN WITH MINERALS) TABS tablet Take 1 tablet by mouth at bedtime.    nitroGLYCERIN (NITROSTAT) 0.4 MG SL tablet Place 1 tablet (0.4 mg total) under the tongue every 5 (five) minutes as needed for chest pain.   ondansetron (ZOFRAN ODT) 8 MG disintegrating tablet Take 1  tablet (8 mg total) by mouth every 8 (eight) hours as needed for nausea or vomiting.   Probiotic Product (RA PROBIOTIC GUMMIES) CHEW Chew 1 each by mouth at bedtime.    rosuvastatin (CRESTOR) 40 MG tablet Take 1 tablet (40 mg total) by mouth daily.   Semaglutide (RYBELSUS) 7 MG TABS Take 7 mg by mouth daily.   zolpidem (AMBIEN) 10 MG tablet Take 1 tablet (10 mg total) by mouth at bedtime as needed for sleep.   No facility-administered encounter medications on file as of 04/23/2021.  Past Surgical History:  Procedure Laterality Date   CARDIAC CATHETERIZATION N/A 10/29/2015   Procedure: Left Heart Cath and Coronary Angiography;  Surgeon: [name], MD;  Location: [facility];  Service: Cardiovascular;  Laterality: N/A;   CHOLECYSTECTOMY     CORONARY ARTERY BYPASS GRAFT N/A 11/02/2015   Procedure: CORONARY ARTERY BYPASS GRAFTING (CABG)x 1 using left internal mammary artery.;  Surgeon: Melrose Nakayama, MD;  Location: [facility];  Service: Open Heart Surgery;  Laterality: N/A;   LEFT OOPHORECTOMY Left    Related to tubal torsion.  Bilateral salpingectomy also.  Remaining uterus and right ovary   LUMBAR FUSION     TEE WITHOUT CARDIOVERSION N/A 11/02/2015   Procedure: TRANSESOPHAGEAL ECHOCARDIOGRAM (TEE);  Surgeon: Melrose Nakayama, MD;  Location: [facility];  Service: Open Heart Surgery;  Laterality: N/A;    Family History  Problem Relation Age of Onset   Colon polyps Father    Stomach cancer Paternal Grandmother    Esophageal cancer Maternal Grandfather    Breast cancer Mother    Ovarian cancer Other        Maternal Side Great  Aunt   Colon cancer Neg Hx     New complaints: Patient has history of breast cancer. She felt a lump in her left breast. She had mammogram in February 2022 which did not show anything  Social history: Lives with husband and son.  Controlled substance contract: 04/23/21     Review of Systems  Constitutional:  Negative for diaphoresis.  Eyes:   Negative for pain.  Respiratory:  Negative for shortness of breath.   Cardiovascular:  Negative for chest pain, palpitations and leg swelling.  Gastrointestinal:  Negative for abdominal pain.  Endocrine: Negative for polydipsia.  Skin:  Negative for rash.  Neurological:  Negative for dizziness, weakness and headaches.  Hematological:  Does not bruise/bleed easily.  All other systems reviewed and are negative.     Objective:   Physical Exam Vitals and nursing note reviewed.  Constitutional:      General: She is not in acute distress.    Appearance: Normal appearance. She is well-developed.  HENT:     Head: Normocephalic.     Right Ear: Tympanic membrane normal.     Left Ear: Tympanic membrane normal.     Nose: Nose normal.     Mouth/Throat:     Mouth: Mucous membranes are moist.  Eyes:     Pupils: Pupils are equal, round, and reactive to light.  Neck:     Vascular: No carotid bruit or JVD.  Cardiovascular:     Rate and Rhythm: Normal rate and regular rhythm.     Heart sounds: Normal heart sounds.  Pulmonary:     Effort: Pulmonary effort is normal. No respiratory distress.     Breath sounds: Normal breath sounds. No wheezing or rales.  Chest:     Chest wall: No tenderness.    Abdominal:     General: Bowel sounds are normal. There is no distension or abdominal bruit.     Palpations: Abdomen is soft. There is no hepatomegaly, splenomegaly, mass or pulsatile mass.     Tenderness: There is no abdominal tenderness.  Musculoskeletal:        General: Normal range of motion.     Cervical back: Normal range of motion and neck supple.  Lymphadenopathy:     Cervical: No cervical adenopathy.  Skin:    General: Skin is warm and dry.  Neurological:  Mental Status: She is alert and oriented to person, place, and time.     Deep Tendon Reflexes: Reflexes are normal and symmetric.  Psychiatric:        Behavior: Behavior normal.        Thought Content: Thought content normal.         Judgment: Judgment normal.   BP (!) 150/84   Pulse 68   Temp (!) 97.4 F (36.3 C) (Temporal)   Resp 20   Ht 5' 8" (1.727 m)   Wt 203 lb (92.1 kg)   SpO2 97%   BMI 30.87 kg/m    HGBA1c 7.0%      Assessment & Plan:  DESTENI PISCOPO comes in today with chief complaint of Medical Management of Chronic Issues   Diagnosis and orders addressed:  1. Essential hypertension Low soduim diet - CBC with Differential/Platelet - CMP14+EGFR - CBC with Differential/Platelet - CMP14+EGFR - metoprolol tartrate (LOPRESSOR) 50 MG tablet; Take 1 tablet (50 mg total) by mouth 2 (two) times daily.  Dispense: 180 tablet; Refill: 1  2. Coronary artery disease involving native coronary artery of native heart with angina pectoris ([place]) Needs to follow up with cardiology  3. Type 2 diabetes mellitus with hyperglycemia, without long-term current use of insulin (HCC) Continue to watch carbs in diet - Bayer DCA Hb A1c Waived - metFORMIN (GLUCOPHAGE) 1000 MG tablet; Take 1 tablet (1,000 mg total) by mouth 2 (two) times daily.  Dispense: 180 tablet; Refill: 1  4. Mixed hyperlipidemia Low fat diet - Lipid panel - Lipid panel - rosuvastatin (CRESTOR) 40 MG tablet; Take 1 tablet (40 mg total) by mouth daily.  Dispense: 90 tablet; Refill: 1  5. Mucopurulent chronic bronchitis ([place])  6. Tobacco abuse Smoking cessation encouraged  7. Upper respiratory infection with cough and congestion  8. Gastroesophageal reflux disease without esophagitis Avoid spicy foods Do not eat 2 hours prior to bedtime - esomeprazole (NEXIUM) 40 MG capsule; Take 1 capsule (40 mg total) by mouth daily.  Dispense: 90 capsule; Refill: 1  9. Recurrent major depressive disorder, in partial remission (HCC) - FLUoxetine (PROZAC) 40 MG capsule; Take 2 capsules (80 mg total) by mouth daily. TAKE ONE (1) CAPSULE EACH DAY  Dispense: 180 capsule; Refill: 1  10. GAD (generalized anxiety disorder) - LORazepam (ATIVAN) 0.5 MG  tablet; Take 1 tablet (0.5 mg total) by mouth every 8 (eight) hours as needed for anxiety.  Dispense: 90 tablet; Refill: 2  11. Iron deficiency anemia secondary to inadequate dietary iron intake Labs pending  12. Stress incontinence of urine  13. Obesity with body mass index of 30.0-39.9 Discussed diet and exercise for person with BMI >25 Will recheck weight in 3-6 months  - Thyroid Panel With TSH  14. Left breast mass - Ambulatory referral to General Surgery   Labs pending Health Maintenance reviewed Diet and exercise encouraged  Follow up plan: 6 months.   [name] [name], [name]

## 2021-04-23 NOTE — Patient Instructions (Signed)
Textbook of family medicine (9th ed., pp. 1062-1073). [place], [place]: [employer].">  Stress, Adult Stress is a normal reaction to life events. Stress is what you feel when life demands more than you are used to, or more than you think you can handle. Some stress can be useful, such as studying for a test or meeting a deadline at work. Stress that occurs too often or for too long can cause problems. It can affect your emotional health and interfere with relationships and normal daily activities. Too much stress can weaken your body's defense system (immune system) and increase your risk for physical illness. If you already have a medicalproblem, stress can make it worse. What are the causes? All sorts of life events can cause stress. An event that causes stress for one person may not be stressful for another person. Major life events, whether positive or negative, commonly cause stress. Examples include: Losing a job or starting a new job. Losing a loved one. Moving to a new town or home. Getting married or divorced. Having a baby. Getting injured or sick. Less obvious life events can also cause stress, especially if they occur day after day or in combination with each other. Examples include: Working long hours. Driving in traffic. Caring for children. Being in debt. Being in a difficult relationship. What are the signs or symptoms? Stress can cause emotional symptoms, including: Anxiety. This is feeling worried, afraid, on edge, overwhelmed, or out of control. Anger, including irritation or impatience. Depression. This is feeling sad, down, helpless, or guilty. Trouble focusing, remembering, or making decisions. Stress can cause physical symptoms, including: Aches and pains. These may affect your head, neck, back, stomach, or other areas of your body. Tight muscles or a clenched jaw. Low energy. Trouble sleeping. Stress can cause unhealthy behaviors, including: Eating to feel better  (overeating) or skipping meals. Working too much or putting off tasks. Smoking, drinking alcohol, or using drugs to feel better. How is this diagnosed? Stress is diagnosed through an assessment by your health care provider. He or she may diagnose this condition based on: Your symptoms and any stressful life events. Your medical history. Tests to rule out other causes of your symptoms. Depending on your condition, your health care provider may refer you to aspecialist for further evaluation. How is this treated?  Stress management techniques are the recommended treatment for stress. Medicineis not typically recommended for the treatment of stress. Techniques to reduce your reaction to stressful life events include: Stress identification. Monitor yourself for symptoms of stress and identify what causes stress for you. These skills may help you to avoid or prepare for stressful events. Time management. Set your priorities, keep a calendar of events, and learn to say no. Taking these actions can help you avoid making too many commitments. Techniques for coping with stress include: Rethinking the problem. Try to think realistically about stressful events rather than ignoring them or overreacting. Try to find the positives in a stressful situation rather than focusing on the negatives. Exercise. Physical exercise can release both physical and emotional tension. The key is to find a form of exercise that you enjoy and do it regularly. Relaxation techniques. These relax the body and mind. The key is to find one or more that you enjoy and use the techniques regularly. Examples include: Meditation, deep breathing, or progressive relaxation techniques. Yoga or tai chi. Biofeedback, mindfulness techniques, or journaling. Listening to music, being out in nature, or participating in other hobbies. Practicing a healthy lifestyle.   Eat a balanced diet, drink plenty of water, limit or avoid caffeine, and get  plenty of sleep. Having a strong support network. Spend time with family, friends, or other people you enjoy being around. Express your feelings and talk things over with someone you trust. Counseling or talk therapy with a mental health professional may be helpful if you are havingtrouble managing stress on your own. Follow these instructions at home: Lifestyle  Avoid drugs. Do not use any products that contain nicotine or tobacco, such as cigarettes, e-cigarettes, and chewing tobacco. If you need help quitting, ask your health care provider. Limit alcohol intake to no more than 1 drink a day for nonpregnant women and 2 drinks a day for men. One drink equals 12 oz of beer, 5 oz of wine, or 1 oz of hard liquor Do not use alcohol or drugs to relax. Eat a balanced diet that includes fresh fruits and vegetables, whole grains, lean meats, fish, eggs, and beans, and low-fat dairy. Avoid processed foods and foods high in added fat, sugar, and salt. Exercise at least 30 minutes on 5 or more days each week. Get 7-8 hours of sleep each night.  General instructions  Practice stress management techniques as discussed with your health care provider. Drink enough fluid to keep your urine clear or pale yellow. Take over-the-counter and prescription medicines only as told by your health care provider. Keep all follow-up visits as told by your health care provider. This is important.  Contact a health care provider if: Your symptoms get worse. You have new symptoms. You feel overwhelmed by your problems and can no longer manage them on your own. Get help right away if: You have thoughts of hurting yourself or others. If you ever feel like you may hurt yourself or others, or have thoughts about taking your own life, get help right away. You can go to your nearest emergency department or call: Your local emergency services (911 in the [place] A suicide crisis helpline, such as the [facility] at 4253100524. This is open 24 hours a day. Summary Stress is a normal reaction to life events. It can cause problems if it happens too often or for too long. Practicing stress management techniques is the best way to treat stress. Counseling or talk therapy with a mental health professional may be helpful if you are having trouble managing stress on your own. This information is not intended to replace advice given to you by your health care provider. Make sure you discuss any questions you have with your healthcare provider. Document Revised: 06/19/2020 Document Reviewed: 06/19/2020 Elsevier Patient Education  2022 [employer].

## 2021-04-24 LAB — CMP14+EGFR
ALT: 34 IU/L — ABNORMAL HIGH (ref 0–32)
AST: 35 IU/L (ref 0–40)
Albumin/Globulin Ratio: 1.7 (ref 1.2–2.2)
Albumin: 4.3 g/dL (ref 3.8–4.9)
Alkaline Phosphatase: 78 IU/L (ref 44–121)
BUN/Creatinine Ratio: 10 (ref 9–23)
BUN: 11 mg/dL (ref 6–24)
Bilirubin Total: <0.2 mg/dL (ref 0.0–1.2)
CO2: 23 mmol/L (ref 20–29)
Calcium: 9.2 mg/dL (ref 8.7–10.2)
Chloride: 99 mmol/L (ref 96–106)
Creatinine, Ser: 1.12 mg/dL — ABNORMAL HIGH (ref 0.57–1.00)
Globulin, Total: 2.5 g/dL (ref 1.5–4.5)
Glucose: 169 mg/dL — ABNORMAL HIGH (ref 65–99)
Potassium: 4.5 mmol/L (ref 3.5–5.2)
Sodium: 137 mmol/L (ref 134–144)
Total Protein: 6.8 g/dL (ref 6.0–8.5)
eGFR: 59 mL/min/{1.73_m2} — ABNORMAL LOW (ref >59)

## 2021-04-24 LAB — CBC WITH DIFFERENTIAL/PLATELET
Basophils Absolute: 0.1 10*3/uL (ref 0.0–0.2)
Basos: 1 %
EOS (ABSOLUTE): 0.2 10*3/uL (ref 0.0–0.4)
Eos: 2 %
Hematocrit: 37.6 % (ref 34.0–46.6)
Hemoglobin: 12.7 g/dL (ref 11.1–15.9)
Immature Grans (Abs): 0.1 10*3/uL (ref 0.0–0.1)
Immature Granulocytes: 1 %
Lymphocytes Absolute: 3 10*3/uL (ref 0.7–3.1)
Lymphs: 30 %
MCH: 30.1 pg (ref 26.6–33.0)
MCHC: 33.8 g/dL (ref 31.5–35.7)
MCV: 89 fL (ref 79–97)
Monocytes Absolute: 0.8 10*3/uL (ref 0.1–0.9)
Monocytes: 8 %
Neutrophils Absolute: 5.7 10*3/uL (ref 1.4–7.0)
Neutrophils: 58 %
Platelets: 231 10*3/uL (ref 150–450)
RBC: 4.22 x10E6/uL (ref 3.77–5.28)
RDW: 12.8 % (ref 11.7–15.4)
WBC: 9.7 10*3/uL (ref 3.4–10.8)

## 2021-04-24 LAB — LIPID PANEL
Chol/HDL Ratio: 9.9 ratio — ABNORMAL HIGH (ref 0.0–4.4)
Cholesterol, Total: 277 mg/dL — ABNORMAL HIGH (ref 100–199)
HDL: 28 mg/dL — ABNORMAL LOW (ref >39)
LDL Chol Calc (NIH): 148 mg/dL — ABNORMAL HIGH (ref 0–99)
Triglycerides: 530 mg/dL — ABNORMAL HIGH (ref 0–149)
VLDL Cholesterol Cal: 101 mg/dL — ABNORMAL HIGH (ref 5–40)

## 2021-04-29 NOTE — Progress Notes (Signed)
Labs are in

## 2021-07-20 ENCOUNTER — Encounter: Payer: Self-pay | Admitting: Family

## 2021-07-20 ENCOUNTER — Ambulatory Visit: Payer: Managed Care, Other (non HMO) | Admitting: Family

## 2021-07-20 DIAGNOSIS — J019 Acute sinusitis, unspecified: Secondary | ICD-10-CM | POA: Diagnosis not present

## 2021-07-20 MED ORDER — DOXYCYCLINE HYCLATE 100 MG PO TABS: 100.0000 mg | ORAL_TABLET | Freq: Two times a day (BID) | ORAL | 0 refills | Status: DC

## 2021-07-20 NOTE — Progress Notes (Signed)
Virtual Visit  Note Due to COVID-19 pandemic this visit was conducted virtually. This visit type was conducted due to national recommendations for restrictions regarding the COVID-19 Pandemic (e.g. social distancing, sheltering in place) in an effort to limit this patient's exposure and mitigate transmission in our community. All issues noted in this document were discussed and addressed.  A physical exam was not performed with this format.  I connected with Brenda Cruz on 07/20/21 at 11:22 AM  by telephone and verified that I am speaking with the correct person using two identifiers. Brenda Cruz is currently located at home and no one is currently with her during visit. The provider, Evelina Dun, [name] is located in their office at time of visit.  I discussed the limitations, risks, security and privacy concerns of performing an evaluation and management service by telephone and the availability of in person appointments. I also discussed with the patient that there may be a patient responsible charge related to this service. The patient expressed understanding and agreed to proceed.  Brenda Cruz, Brenda Cruz are scheduled for a virtual visit with your provider today.    Just as we do with appointments in the office, we must obtain your consent to participate.  Your consent will be active for this visit and any virtual visit you may have with one of our providers in the next 365 days.    If you have a MyChart account, I can also send a copy of this consent to you electronically.  All virtual visits are billed to your insurance company just like a traditional visit in the office.  As this is a virtual visit, video technology does not allow for your provider to perform a traditional examination.  This may limit your provider's ability to fully assess your condition.  If your provider identifies any concerns that need to be evaluated in person or the need to arrange testing such as labs, EKG, etc, we will  make arrangements to do so.    Although advances in technology are sophisticated, we cannot ensure that it will always work on either your end or our end.  If the connection with a video visit is poor, we may have to switch to a telephone visit.  With either a video or telephone visit, we are not always able to ensure that we have a secure connection.   I need to obtain your verbal consent now.   Are you willing to proceed with your visit today?   Brenda Cruz has provided verbal consent on 07/20/2021 for a virtual visit (video or telephone).   Evelina Dun, [place] 07/20/2021  11:24 AM   History and Present Illness:  PT calls the office today with cough and congestion that started three days ago. She reports she has taken a COVID test and it was negative.  Sinusitis This is a new problem. The current episode started in the past 7 days. The problem has been gradually worsening since onset. The maximum temperature recorded prior to her arrival was 100.4 - 100.9 F. Her pain is at a severity of 7/10. Associated symptoms include congestion, coughing, ear pain, headaches, a hoarse voice, sinus pressure and swollen glands. Pertinent negatives include no shortness of breath, sneezing or sore throat. Past treatments include acetaminophen. The treatment provided mild relief.     Review of Systems  HENT:  Positive for congestion, ear pain, hoarse voice and sinus pressure. Negative for sneezing and sore throat.   Respiratory:  Positive for  cough. Negative for shortness of breath.   Neurological:  Positive for headaches.    Observations/Objective: No SOB or distress noted, hoarse voice   Assessment and Plan: 1. Acute sinusitis, recurrence not specified, unspecified location Pt encouraged to continue to use flonase, zyrtec, and mucinex.  Force fluids I will send in doxycycline for her to start if her symptoms worsen or do not improve.  Tylenol as needed  - doxycycline (VIBRA-TABS) 100 MG tablet;  Take 1 tablet (100 mg total) by mouth 2 (two) times daily.  Dispense: 20 tablet; Refill: 0    I discussed the assessment and treatment plan with the patient. The patient was provided an opportunity to ask questions and all were answered. The patient agreed with the plan and demonstrated an understanding of the instructions.   The patient was advised to call back or seek an in-person evaluation if the symptoms worsen or if the condition fails to improve as anticipated.  The above assessment and management plan was discussed with the patient. The patient verbalized understanding of and has agreed to the management plan. Patient is aware to call the clinic if symptoms persist or worsen. Patient is aware when to return to the clinic for a follow-up visit. Patient educated on when it is appropriate to go to the emergency department.   Time call ended:  11:34 AM   I provided 12 minutes of  non face-to-face time during this encounter.    Evelina Dun, [name]

## 2021-09-01 LAB — HM DIABETES EYE EXAM

## 2021-10-26 ENCOUNTER — Ambulatory Visit: Payer: Self-pay | Admitting: Nurse Practitioner

## 2021-10-29 ENCOUNTER — Ambulatory Visit: Payer: Managed Care, Other (non HMO) | Admitting: Nurse Practitioner

## 2021-10-29 NOTE — Progress Notes (Deleted)
Subjective:    Patient ID: Brenda Cruz, female    DOB: 08/12/1966, 56 y.o.   MRN: [id number]   Chief Complaint: medical management of chronic issues     HPI:  Brenda Cruz is a 56 y.o. who identifies as a female who was assigned female at birth.   Social history: Lives with: her husband and son Work history: works at [employer] in today for follow up of the following chronic medical issues:  1. Essential hypertension No c/o chest pain, sob or headache. She checks her blood pressure frequently at work.  2. Coronary artery disease involving native coronary artery of native heart with angina pectoris ([place]) Has had a CABG several years ago. Last cardiology appointment was over a year ago.  3. Mucopurulent chronic bronchitis ([place]) She is on no inhalers. Has a chronic cough. Does not feel SOB.  4. Gastroesophageal reflux disease without esophagitis Is on nexium daily and that seems to work well to prevent flare up.  5. Type 2 diabetes mellitus with hyperglycemia, without long-term current use of insulin ([facility]) She does not check her blood sugars everyday. She says they fluctuate. She has not been watching diet very closely. Lab Results  Component Value Date   HGBA1C 7.0 (H) 04/23/2021     6. Stress incontinence of urine She has to wear pads all the time. She is currently not on any prescription medications for this.  7. Fatigue, unspecified type Stays tired all the time. Her mom has been real sick and she is staying with her after work and she is not able to rest very well.  8. Mixed hyperlipidemia [name] watch diet and does little to no exercise. Lab Results  Component Value Date   CHOL 277 (H) 04/23/2021   HDL 28 (L) 04/23/2021   LDLCALC 148 (H) 04/23/2021   TRIG 530 (H) 04/23/2021   CHOLHDL 9.9 (H) 04/23/2021     9. Recurrent major depressive disorder, in partial remission ([place]) Has been on prozac for several years and seems to work  well for her.no medication side effects.  10. GAD (generalized anxiety disorder) Has been really stressed lately with her moms declining health.  11. Tobacco abuse Smokes over a pack a day. Worse due to stress.  12. 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   Sulfa Antibiotics Itching and Rash   Outpatient Encounter Medications as of 10/29/2021  Medication Sig   albuterol (VENTOLIN HFA) 108 (90 Base) MCG/ACT inhaler Inhale 2  puffs into the lungs every 4 (four) hours as needed for wheezing or shortness of breath.   aspirin EC 81 MG tablet Take 81 mg by mouth every evening.    blood glucose meter kit and supplies Dispense based on patient and insurance preference. Use up to four times daily as directed. (FOR ICD-10 E10.9, E11.9).   cyclobenzaprine (FLEXERIL) 10 MG tablet Take 1 tablet (10 mg total) by mouth 3 (three) times daily as needed for muscle spasms.   diphenhydrAMINE (BENADRYL) 25 MG tablet Take 25 mg by mouth every 6 (six) hours as needed for itching or  allergies.   doxycycline (VIBRA-TABS) 100 MG tablet Take 1 tablet (100 mg total) by mouth 2 (two) times daily.   esomeprazole (NEXIUM) 40 MG capsule Take 1 capsule (40 mg total) by mouth daily.   FLUoxetine (PROZAC) 40 MG capsule Take 2 capsules (80 mg total) by mouth daily. TAKE ONE (1) CAPSULE EACH DAY   HYDROcodone-acetaminophen (NORCO/VICODIN) 5-325 MG tablet Take 1 tablet by mouth every 6 (six) hours as needed for moderate pain.   ipratropium-albuterol (DUONEB) 0.5-2.5 (3) MG/3ML SOLN Take 3 mLs by nebulization every 6 (six) hours as needed for Wheezing.   LORazepam (ATIVAN) 0.5 MG tablet Take 1 tablet (0.5 mg total) by mouth every 8 (eight) hours as needed for anxiety.   magnesium oxide (MAG-OX) 400 MG tablet Take by mouth.   metFORMIN (GLUCOPHAGE) 1000 MG tablet Take 1 tablet (1,000 mg total) by mouth 2 (two) times daily.   metoprolol tartrate (LOPRESSOR) 50 MG tablet Take 1 tablet (50 mg total) by mouth 2 (two) times daily.   Multiple Vitamin (MULTIVITAMIN WITH MINERALS) TABS tablet Take 1 tablet by mouth at bedtime.    nitroGLYCERIN (NITROSTAT) 0.4 MG SL tablet Place 1 tablet (0.4 mg total) under the tongue every 5 (five) minutes as needed for chest pain.   ondansetron (ZOFRAN ODT) 8 MG disintegrating tablet Take 1 tablet (8 mg total) by mouth every 8 (eight) hours as needed for nausea or vomiting.   Probiotic Product (RA PROBIOTIC GUMMIES) CHEW Chew 1 each by mouth at bedtime.    rosuvastatin (CRESTOR) 40 MG tablet Take 1 tablet (40 mg total) by mouth daily.   No facility-administered encounter medications on file as of 10/29/2021.    Past Surgical History:  Procedure Laterality Date   CARDIAC CATHETERIZATION N/A 10/29/2015   Procedure: Left Heart Cath and Coronary Angiography;  Surgeon: [name], MD;  Location: [facility];  Service: Cardiovascular;  Laterality: N/A;   CHOLECYSTECTOMY     CORONARY ARTERY BYPASS GRAFT N/A 11/02/2015   Procedure: CORONARY ARTERY BYPASS  GRAFTING (CABG)x 1 using left internal mammary artery.;  Surgeon: Melrose Nakayama, MD;  Location: [facility];  Service: Open Heart Surgery;  Laterality: N/A;   LEFT OOPHORECTOMY Left    Related to tubal torsion.  Bilateral salpingectomy also.  Remaining uterus and right ovary   LUMBAR FUSION     TEE WITHOUT CARDIOVERSION N/A 11/02/2015   Procedure: TRANSESOPHAGEAL ECHOCARDIOGRAM (TEE);  Surgeon: Melrose Nakayama, MD;  Location: [facility];  Service: Open Heart Surgery;  Laterality: N/A;    Family History  Problem Relation Age of Onset   Colon polyps Father    Stomach cancer Paternal Grandmother    Esophageal cancer Maternal Grandfather    Breast cancer Mother    Ovarian cancer Other        Maternal Side Great  Aunt   Colon cancer Neg Hx  Controlled substance contract: ***     Review of Systems     Objective:   Physical Exam        Assessment & Plan:

## 2021-11-02 ENCOUNTER — Encounter: Payer: Self-pay | Admitting: Nurse Practitioner

## 2021-11-15 ENCOUNTER — Other Ambulatory Visit: Payer: Self-pay | Admitting: Family

## 2021-11-15 ENCOUNTER — Other Ambulatory Visit: Payer: Self-pay | Admitting: Nurse Practitioner

## 2021-11-15 DIAGNOSIS — F411 Generalized anxiety disorder: Secondary | ICD-10-CM

## 2021-11-15 DIAGNOSIS — J189 Pneumonia, unspecified organism: Secondary | ICD-10-CM

## 2022-01-17 ENCOUNTER — Encounter: Payer: Self-pay | Admitting: Nurse Practitioner

## 2022-01-17 ENCOUNTER — Ambulatory Visit: Payer: Managed Care, Other (non HMO) | Admitting: Nurse Practitioner

## 2022-01-17 VITALS — BP 137/70 | HR 99 | Temp 98.3°F | Resp 18 | Ht 68.0 in | Wt 209.6 lb

## 2022-01-17 DIAGNOSIS — K929 Disease of digestive system, unspecified: Secondary | ICD-10-CM | POA: Insufficient documentation

## 2022-01-17 DIAGNOSIS — I10 Essential (primary) hypertension: Secondary | ICD-10-CM | POA: Diagnosis not present

## 2022-01-17 DIAGNOSIS — R1013 Epigastric pain: Secondary | ICD-10-CM | POA: Insufficient documentation

## 2022-01-17 NOTE — Patient Instructions (Signed)

## 2022-01-17 NOTE — Progress Notes (Signed)
? ?Subjective:  ? ? Patient ID: Brenda Cruz, female    DOB: [birth year], 56 y.o.   MRN: [id number] ? ? ?Chief Complaint: Hypertension ? ? ?Hypertension ?Associated symptoms include chest pain. Pertinent negatives include no headaches, palpitations or shortness of breath.  ?The last few weeks  her blood pressure was really high. She has been taking extra pills tat she had at home as well as extra ativan. She had some chest pain and took a nitro which helped. She had added amlodipine back to her meds and that has really helped. She has been checking her blood pressure all day today and it has been good since adding amlodipine. ? ? ? ?Review of Systems  ?Constitutional:  Negative for diaphoresis.  ?Eyes:  Negative for pain.  ?Respiratory:  Positive for chest tightness. Negative for shortness of breath.   ?Cardiovascular:  Positive for chest pain. Negative for palpitations and leg swelling.  ?Gastrointestinal:  Negative for abdominal pain.  ?Endocrine: Negative for polydipsia.  ?Skin:  Negative for rash.  ?Neurological:  Negative for dizziness, weakness and headaches.  ?Hematological:  Does not bruise/bleed easily.  ?All other systems reviewed and are negative. ? ?   ?Objective:  ? Physical Exam ?Vitals and nursing note reviewed.  ?Constitutional:   ?   General: She is not in acute distress. ?   Appearance: Normal appearance. She is well-developed.  ?HENT:  ?   Head: Normocephalic.  ?   Right Ear: Tympanic membrane normal.  ?   Left Ear: Tympanic membrane normal.  ?   Nose: Nose normal.  ?   Mouth/Throat:  ?   Mouth: Mucous membranes are moist.  ?Eyes:  ?   Pupils: Pupils are equal, round, and reactive to light.  ?Neck:  ?   Vascular: No carotid bruit or JVD.  ?Cardiovascular:  ?   Rate and Rhythm: Normal rate and regular rhythm.  ?   Heart sounds: Normal heart sounds.  ?Pulmonary:  ?   Effort: Pulmonary effort is normal. No respiratory distress.  ?   Breath sounds: Normal breath sounds. No wheezing or rales.   ?Chest:  ?   Chest wall: No tenderness.  ?Abdominal:  ?   General: Bowel sounds are normal. There is no distension or abdominal bruit.  ?   Palpations: Abdomen is soft. There is no hepatomegaly, splenomegaly, mass or pulsatile mass.  ?   Tenderness: There is no abdominal tenderness.  ?Musculoskeletal:     ?   General: Normal range of motion.  ?   Cervical back: Normal range of motion and neck supple.  ?Lymphadenopathy:  ?   Cervical: No cervical adenopathy.  ?Skin: ?   General: Skin is warm and dry.  ?Neurological:  ?   Mental Status: She is alert and oriented to person, place, and time.  ?   Deep Tendon Reflexes: Reflexes are normal and symmetric.  ?Psychiatric:     ?   Behavior: Behavior normal.     ?   Thought Content: Thought content normal.     ?   Judgment: Judgment normal.  ? ? ? ? ?BP 137/70   Pulse 99   Temp 98.3 ?F (36.8 ?C)   Resp 18   Ht '5\' 8"'$  (1.727 m)   Wt 209 lb 9.6 oz (95.1 kg)   LMP 06/03/2017 (Exact Date)   SpO2 99%   BMI 31.87 kg/m?  ? ?Ekg-NSR-Preliminary reading by Ronnald Collum, [name]  [facility] ? ?   ?Assessment & Plan:  ? ?  Brenda Cruz in today with chief complaint of Hypertension ? ? ?1. Essential hypertension ?Continue metoproll and amlodipine. ?Keep diary of blood pressures ?Need to follow up with cardiology ?- EKG 12-Lead ? ? ? ?The above assessment and management plan was discussed with the patient. The patient verbalized understanding of and has agreed to the management plan. Patient is aware to call the clinic if symptoms persist or worsen. Patient is aware when to return to the clinic for a follow-up visit. Patient educated on when it is appropriate to go to the emergency department.  ? ?[name] [name], [name] ? ? ?

## 2022-01-18 ENCOUNTER — Ambulatory Visit: Payer: Managed Care, Other (non HMO) | Admitting: Nurse Practitioner

## 2022-02-09 ENCOUNTER — Telehealth: Payer: Self-pay | Admitting: Nurse Practitioner

## 2022-02-09 NOTE — Telephone Encounter (Signed)
?  Prescription Request ? ?02/09/2022 ? ?Is this a "Controlled Substance" medicine? amLODipine (NORVASC) 2.5 MG tablet ?LORazepam (ATIVAN) 0.5 MG tablet ?Yes HYDROcodone-acetaminophen (NORCO/VICODIN) 5-325 MG tablet ? ?Have you seen your PCP in the last 2 weeks? 01/17/2022. ?Pt was to start amLODipine (NORVASC) 2.5 MG tablet rx but MMM did not send in a new rx. ?Pt does not have any refills for LORazepam (ATIVAN) 0.5 MG tablet ?Pt says that HYDROcodone-acetaminophen (NORCO/VICODIN) 5-325 MG tablet was discuss for for refills but there are none on file at the pharmacy. She is aware that the pain rx is to be addressed in office visit and she says it was. ? ? ?If YES, route message to pool  -  If NO, patient needs to be scheduled for appointment. ? ?What is the name of the medication or equipment? amLODipine (NORVASC) 2.5 MG tablet ?LORazepam (ATIVAN) 0.5 MG tablet ?HYDROcodone-acetaminophen (NORCO/VICODIN) 5-325 MG tablet ? ?Have you contacted your pharmacy to request a refill? no  ?Pt aware MMM will address tomorrow. ? ?Which pharmacy would you like this sent to? [facility] ? ? ?Patient notified that their request is being sent to the clinical staff for review and that they should receive a response within 2 business days.  ? ? ? ? ?

## 2022-02-10 NOTE — Telephone Encounter (Signed)
Made pt aware. She has appt to see PCP on 02/17/22 for lorazepam and pain med refills. ?

## 2022-02-10 NOTE — Telephone Encounter (Signed)
Amlodipine was refilled on 02/06/22. She needs t be seen for refill of lorazepam and pain meds. ?

## 2022-02-10 NOTE — Telephone Encounter (Signed)
Called patient, no answer, left message to return call 

## 2022-02-17 ENCOUNTER — Encounter: Payer: Self-pay | Admitting: Nurse Practitioner

## 2022-02-17 ENCOUNTER — Ambulatory Visit: Payer: Managed Care, Other (non HMO) | Admitting: Nurse Practitioner

## 2022-02-17 VITALS — BP 124/80 | HR 86 | Temp 97.6°F | Resp 20 | Ht 68.0 in | Wt 211.0 lb

## 2022-02-17 DIAGNOSIS — F411 Generalized anxiety disorder: Secondary | ICD-10-CM | POA: Diagnosis not present

## 2022-02-17 DIAGNOSIS — W57XXXA Bitten or stung by nonvenomous insect and other nonvenomous arthropods, initial encounter: Secondary | ICD-10-CM

## 2022-02-17 DIAGNOSIS — J0100 Acute maxillary sinusitis, unspecified: Secondary | ICD-10-CM

## 2022-02-17 MED ORDER — HYDROCODONE-ACETAMINOPHEN 5-325 MG PO TABS: 1.0000 | ORAL_TABLET | Freq: Four times a day (QID) | ORAL | 0 refills | Status: DC | PRN

## 2022-02-17 MED ORDER — LORAZEPAM 0.5 MG PO TABS: 0.5000 mg | ORAL_TABLET | Freq: Three times a day (TID) | ORAL | 2 refills | Status: DC | PRN

## 2022-02-17 MED ORDER — DOXYCYCLINE HYCLATE 100 MG PO TABS: 100.0000 mg | ORAL_TABLET | Freq: Two times a day (BID) | ORAL | 0 refills | Status: DC

## 2022-02-17 NOTE — Progress Notes (Signed)
? ?Subjective:  ? ? Patient ID: Brenda Cruz, female    DOB: Apr 25, 1966, 56 y.o.   MRN: [id number] ? ? ?Chief Complaint: Joint Pain (Swollen lymph nodes/Recent tick bites//) ? ? ?HPI ?Patient comes in with 2 complaints: ?- Patient c/o cough and congestion and very scratchy throat has been that way for several weeks. ?- joint pain and fatigue for several weeks. Has had several recent tick bites. ?- has rash under left breast. She has tried hydrocortisone and monistat and either are helping. ? ? ? ?Review of Systems  ?Constitutional:  Negative for diaphoresis.  ?Eyes:  Negative for pain.  ?Respiratory:  Negative for shortness of breath.   ?Cardiovascular:  Negative for chest pain, palpitations and leg swelling.  ?Gastrointestinal:  Negative for abdominal pain.  ?Endocrine: Negative for polydipsia.  ?Skin:  Negative for rash.  ?Neurological:  Negative for dizziness, weakness and headaches.  ?Hematological:  Does not bruise/bleed easily.  ?All other systems reviewed and are negative. ? ?   ?Objective:  ? Physical Exam ?Vitals reviewed.  ?Constitutional:   ?   Appearance: Normal appearance. She is obese.  ?HENT:  ?   Right Ear: Tympanic membrane normal.  ?   Left Ear: Tympanic membrane normal. There is no impacted cerumen.  ?   Nose: Congestion and rhinorrhea present.  ?   Mouth/Throat:  ?   Mouth: Mucous membranes are moist.  ?   Pharynx: No oropharyngeal exudate or posterior oropharyngeal erythema.  ?Cardiovascular:  ?   Rate and Rhythm: Normal rate and regular rhythm.  ?   Heart sounds: Normal heart sounds.  ?Pulmonary:  ?   Effort: Pulmonary effort is normal.  ?   Breath sounds: Normal breath sounds.  ?Musculoskeletal:  ?   Cervical back: Normal range of motion.  ?Skin: ?   General: Skin is warm.  ?Neurological:  ?   General: No focal deficit present.  ?   Mental Status: She is alert and oriented to person, place, and time.  ?Psychiatric:     ?   Mood and Affect: Mood normal.     ?   Behavior: Behavior normal.   ? ?BP 124/80   Pulse 86   Temp 97.6 ?F (36.4 ?C) (Temporal)   Resp 20   Ht '5\' 8"'$  (1.727 m)   Wt 211 lb (95.7 kg)   LMP 06/03/2017 (Exact Date)   SpO2 97%   BMI 32.08 kg/m?  ? ? ? ? ?   ?Assessment & Plan:  ? ?Brenda Cruz in today with chief complaint of Joint Pain (Swollen lymph nodes/Recent tick bites//) ? ? ?1. Acute non-recurrent maxillary sinusitis ?1. Take meds as prescribed ?2. Use a cool mist humidifier especially during the winter months and when heat has been humid. ?3. Use saline nose sprays frequently ?4. Saline irrigations of the nose can be very helpful if done frequently. ? * 4X daily for 1 week* ? * Use of a nettie pot can be helpful with this. Follow directions with this* ?5. Drink plenty of fluids ?6. Keep thermostat turn down low ?7.For any cough or congestion- mucinex OTC ?8. For fever or aces or pains- take tylenol or ibuprofen appropriate for age and weight. ? * for fevers greater than 101 orally you may alternate ibuprofen and tylenol every  3 hours. ?  ? ? ?2. Tick bite, unspecified site, initial encounter ?Labs pending ?- [place] mtn spotted fvr abs pnl(IgG+IgM) ?- Lyme Disease Serology w/Reflex ?- CBC with Differential/Platelet ?-  doxycycline (VIBRA-TABS) 100 MG tablet; Take 1 tablet (100 mg total) by mouth 2 (two) times daily. 1 po bid  Dispense: 28 tablet; Refill: 0 ? ? ? ?The above assessment and management plan was discussed with the patient. The patient verbalized understanding of and has agreed to the management plan. Patient is aware to call the clinic if symptoms persist or worsen. Patient is aware when to return to the clinic for a follow-up visit. Patient educated on when it is appropriate to go to the emergency department.  ? ?[name] [name], [name] ? ? ?

## 2022-02-17 NOTE — Patient Instructions (Signed)
Tick Bite Information, Adult  Ticks are insects that can bite. Most ticks live in shrubs and grassy areas. They climb onto people and animals that go by. Then they bite. Some ticks carry germs that can make you sick. How can I prevent tick bites? Take these steps: Use insect repellent Use an insect repellent that has 20% or higher of the ingredients DEET, picaridin, or IR3535. Follow the instructions on the label. Put it on: Bare skin. The tops of your boots. Your pant legs. The ends of your sleeves. If you use an insect repellent that has the ingredient permethrin, follow the instructions on the label. Put it on: Clothing. Boots. Supplies or outdoor gear. Tents. When you are outside Wear long sleeves and long pants. Wear light-colored clothes. Tuck your pant legs into your socks. Stay in the middle of the trail. Do not touch the bushes. Avoid walking through long grass. Check for ticks on your clothes, hair, and skin often while you are outside. Before going inside your house, check your clothes, skin, head, neck, armpits, waist, groin, and joint areas. When you go indoors Check your clothes for ticks. Dry your clothes in a dryer on high heat for 10 minutes or more. If clothes are damp, additional time may be needed. Wash your clothes right away if they need to be washed. Use hot water. Check your pets and outdoor gear. Shower right away. Check your body for ticks. Do a full body check using a mirror. What is the right way to remove a tick? Remove the tick from your skin as soon as possible. Do not remove the tick with your bare fingers. To remove a tick that is crawling on your skin: Go outdoors and brush the tick off. Use tape or a lint roller. To remove a tick that is biting: Wash your hands. If you have latex gloves, put them on. Use tweezers, curved forceps, or a tick-removal tool to grasp the tick. Grasp the tick as close to your skin and as close to the tick's head as  possible. Gently pull up until the tick lets go. Try to keep the tick's head attached to its body. Do not twist or jerk the tick. Do not squeeze or crush the tick. Do not try to remove a tick with heat, alcohol, petroleum jelly, or fingernail polish. What should I do after taking out a tick? Throw away the tick. Do not crush a tick with your fingers. Clean the bite area and your hands with soap and water, rubbing alcohol, or an iodine wash. If an antiseptic cream or ointment is available, apply a small amount to the bite area. Wash and disinfect any instruments that you used to remove the tick. How should I get rid of a live tick? To dispose of a live tick, use one of these methods: Place the tick in rubbing alcohol. Place the tick in a bag or container you can close tightly. Wrap the tick tightly in tape. Flush the tick down the toilet. Contact a doctor if: You have symptoms, such as: A fever or chills. A red rash that makes a circle (bull's-eye rash) in the bite area. Redness and swelling where the tick bit you. Headache. Pain in a muscle, joint, or bone. Being more tired than normal. Trouble walking or moving your legs. Numbness in your legs. Tender and swollen lymph glands. A part of a tick breaks off and gets stuck in your skin. Get help right away if: You cannot remove   a tick. You cannot move (have paralysis) or feel weak. You are feeling worse or have new symptoms. You find a tick that is biting you and filled with blood. This is important if you are in an area where diseases from ticks are common. Summary Ticks may carry germs that can make you sick. To prevent tick bites wear long sleeves, long pants, and light colors. Use insect repellent. Follow the instructions on the label. If the tick is biting, do not try to remove it with heat, alcohol, petroleum jelly, or fingernail polish. Use tweezers, curved forceps, or a tick-removal tool to grasp the tick. Gently pull up  until the tick lets go. Do not twist or jerk the tick. Do not squeeze or crush the tick. If you have symptoms, contact a doctor. This information is not intended to replace advice given to you by your health care provider. Make sure you discuss any questions you have with your health care provider. Document Revised: 09/30/2019 Document Reviewed: 09/30/2019 Elsevier Patient Education  2023 [employer]vier Inc.  

## 2022-02-17 NOTE — Addendum Note (Signed)
Addended by: [name] on: 02/17/2022 12:17 PM ? ? Modules accepted: Orders ? ?

## 2022-02-21 LAB — CBC WITH DIFFERENTIAL/PLATELET
Basophils Absolute: 0.1 10*3/uL (ref 0.0–0.2)
Basos: 1 %
EOS (ABSOLUTE): 0.2 10*3/uL (ref 0.0–0.4)
Eos: 2 %
Hematocrit: 40.9 % (ref 34.0–46.6)
Hemoglobin: 13.7 g/dL (ref 11.1–15.9)
Immature Grans (Abs): 0.1 10*3/uL (ref 0.0–0.1)
Immature Granulocytes: 1 %
Lymphocytes Absolute: 3.1 10*3/uL (ref 0.7–3.1)
Lymphs: 31 %
MCH: 29.7 pg (ref 26.6–33.0)
MCHC: 33.5 g/dL (ref 31.5–35.7)
MCV: 89 fL (ref 79–97)
Monocytes Absolute: 0.8 10*3/uL (ref 0.1–0.9)
Monocytes: 8 %
Neutrophils Absolute: 5.6 10*3/uL (ref 1.4–7.0)
Neutrophils: 57 %
Platelets: 275 10*3/uL (ref 150–450)
RBC: 4.61 x10E6/uL (ref 3.77–5.28)
RDW: 12.5 % (ref 11.7–15.4)
WBC: 9.9 10*3/uL (ref 3.4–10.8)

## 2022-02-21 LAB — ROCKY MTN SPOTTED FVR ABS PNL(IGG+IGM)
RMSF IgG: POSITIVE — AB
RMSF IgM: 0.59 index (ref 0.00–0.89)

## 2022-02-21 LAB — RMSF, IGG, IFA: RMSF, IGG, IFA: <1:64 {titer}

## 2022-02-21 LAB — LYME DISEASE SEROLOGY W/REFLEX: Lyme Total Antibody EIA: NEGATIVE

## 2022-02-22 NOTE — Telephone Encounter (Signed)
Sounds like you are getting better. I would not think it is a good idea to go through another round of Doxy, please give time for your body to heal, if symptoms get worse please call or follow up.  ? ? ?

## 2022-02-24 ENCOUNTER — Telehealth: Payer: Self-pay

## 2022-02-24 NOTE — Telephone Encounter (Signed)
Patient called office stating that she has been taking Doxy for [facility] Spotted fever since last Thursday. She does have a rash and headache but concerned that she is not improving. Today she has blisters on her fingers. Spoke with MD here in office and he suggested for patient to go to ER for an evaluation. Advised patient of recommendation and she advised that she would go home and speak with her husband and then go to the ER.  ?

## 2022-03-01 ENCOUNTER — Other Ambulatory Visit: Payer: Self-pay

## 2022-03-01 ENCOUNTER — Encounter (HOSPITAL_COMMUNITY): Payer: Self-pay

## 2022-03-01 ENCOUNTER — Emergency Department (HOSPITAL_COMMUNITY)
Admission: EM | Admit: 2022-03-01 | Discharge: 2022-03-01 | Disposition: A | Discharge status: Home / Self Care | Payer: Managed Care, Other (non HMO) | Attending: Emergency Medicine | Admitting: Emergency Medicine

## 2022-03-01 DIAGNOSIS — E860 Dehydration: Secondary | ICD-10-CM | POA: Insufficient documentation

## 2022-03-01 DIAGNOSIS — R21 Rash and other nonspecific skin eruption: Secondary | ICD-10-CM | POA: Diagnosis not present

## 2022-03-01 DIAGNOSIS — R5383 Other fatigue: Secondary | ICD-10-CM | POA: Diagnosis present

## 2022-03-01 DIAGNOSIS — Z7984 Long term (current) use of oral hypoglycemic drugs: Secondary | ICD-10-CM | POA: Diagnosis not present

## 2022-03-01 DIAGNOSIS — Z7982 Long term (current) use of aspirin: Secondary | ICD-10-CM | POA: Insufficient documentation

## 2022-03-01 DIAGNOSIS — R197 Diarrhea, unspecified: Secondary | ICD-10-CM | POA: Insufficient documentation

## 2022-03-01 HISTORY — DX: Spotted fever due to Rickettsia rickettsii: A77.0

## 2022-03-01 LAB — CBC WITH DIFFERENTIAL/PLATELET
Abs Immature Granulocytes: 0.08 10*3/uL — ABNORMAL HIGH (ref 0.00–0.07)
Basophils Absolute: 0.1 10*3/uL (ref 0.0–0.1)
Basophils Relative: 1 %
Eosinophils Absolute: 0.2 10*3/uL (ref 0.0–0.5)
Eosinophils Relative: 2 %
HCT: 38.9 % (ref 36.0–46.0)
Hemoglobin: 13.3 g/dL (ref 12.0–15.0)
Immature Granulocytes: 1 %
Lymphocytes Relative: 31 %
Lymphs Abs: 3.2 10*3/uL (ref 0.7–4.0)
MCH: 30.9 pg (ref 26.0–34.0)
MCHC: 34.2 g/dL (ref 30.0–36.0)
MCV: 90.5 fL (ref 80.0–100.0)
Monocytes Absolute: 0.9 10*3/uL (ref 0.1–1.0)
Monocytes Relative: 8 %
Neutro Abs: 6.1 10*3/uL (ref 1.7–7.7)
Neutrophils Relative %: 57 %
Platelets: 229 10*3/uL (ref 150–400)
RBC: 4.3 MIL/uL (ref 3.87–5.11)
RDW: 13 % (ref 11.5–15.5)
WBC: 10.4 10*3/uL (ref 4.0–10.5)
nRBC: 0 % (ref 0.0–0.2)

## 2022-03-01 LAB — COMPREHENSIVE METABOLIC PANEL
ALT: 24 U/L (ref 0–44)
AST: 25 U/L (ref 15–41)
Albumin: 3.5 g/dL (ref 3.5–5.0)
Alkaline Phosphatase: 79 U/L (ref 38–126)
Anion gap: 8 (ref 5–15)
BUN: 18 mg/dL (ref 6–20)
CO2: 20 mmol/L — ABNORMAL LOW (ref 22–32)
Calcium: 8.7 mg/dL — ABNORMAL LOW (ref 8.9–10.3)
Chloride: 106 mmol/L (ref 98–111)
Creatinine, Ser: 1.33 mg/dL — ABNORMAL HIGH (ref 0.44–1.00)
GFR, Estimated: 48 mL/min — ABNORMAL LOW (ref >60)
Glucose, Bld: 183 mg/dL — ABNORMAL HIGH (ref 70–99)
Potassium: 4 mmol/L (ref 3.5–5.1)
Sodium: 134 mmol/L — ABNORMAL LOW (ref 135–145)
Total Bilirubin: 0.5 mg/dL (ref 0.3–1.2)
Total Protein: 7.1 g/dL (ref 6.5–8.1)

## 2022-03-01 MED ORDER — SODIUM CHLORIDE 0.9 % IV BOLUS
1000.0000 mL | Freq: Once | INTRAVENOUS | Status: AC
  Administered 2022-03-01: 1000 mL via INTRAVENOUS

## 2022-03-01 NOTE — ED Triage Notes (Signed)
Patient reports that she has been treated for [facility] mountain spotted Tick Fever for 7 days with doxycycline. ? ?Patient states she feels like her symptoms are worse. Patient states she is having green diarrhea, rash on hands, fever, "veins are popping and burning." ?

## 2022-03-01 NOTE — ED Provider Notes (Signed)
?[facility] DEPT ?Provider Note ? ? ?CSN: [id number] ?Arrival date & time: 03/01/22  1433 ? ?  ? ?History ? ?Chief Complaint  ?Patient presents with  ? Fever  ? Nausea  ? Rash  ? Diarrhea  ? Headache  ? ? ?Brenda Cruz is a 56 y.o. female. ? ?56 year old female with prior medical history as detailed below presents for evaluation.  Patient reports that she presented to her PCP on May 4.  She reported several weeks of malaise, fatigue, and diffuse joint pain.  She reported history of multiple tick bites. ? ?Her PCP ordered Lyme and RMSF titers.  Patient's Lyme titer was negative.  RMSF was IgG positive. ? ?Notably patient reports prior history of Lyme. ? ?Patient was given 14-day prescription for doxycycline. ? ?Patient reports that she is on day 12 of her Doxy prescription. ? ?She complains of continued fatigue.  She complains of loose stools.  She denies of cramping abdominal discomfort. ? ?She denies fever.  She denies headache.  She denies vomiting.  ? ? ?The history is provided by the patient and medical records.  ?Illness ?Location:  Malaise, fatigue, loose stool ?Severity:  Mild ?Onset quality:  Gradual ?Duration:  2 weeks ?Timing:  Constant ?Progression:  Waxing and waning ?Chronicity:  New ? ?  ? ?Home Medications ?Prior to Admission medications   ?Medication Sig Start Date End Date Taking? Authorizing Provider  ?acetaminophen (TYLENOL) 500 MG tablet Take 500 mg by mouth every 6 (six) hours as needed for mild pain.   Yes [provider]  ?albuterol (VENTOLIN HFA) 108 (90 Base) MCG/ACT inhaler Inhale 2 puffs into the lungs every 4 (four) hours as needed for wheezing or shortness of breath. 12/30/19  Yes Terald Sleeper, PA-C  ?aspirin EC 81 MG tablet Take 81 mg by mouth every evening.    Yes [provider]  ?cyclobenzaprine (FLEXERIL) 10 MG tablet Take 1 tablet (10 mg total) by mouth 3 (three) times daily as needed for muscle spasms. 05/25/20  Yes [name], [name]  ?diphenhydrAMINE (BENADRYL) 25 MG tablet Take 25 mg by mouth every 6 (six) hours as needed for itching or allergies.   Yes [provider]  ?doxycycline (VIBRA-TABS) 100 MG tablet Take 1 tablet (100 mg total) by mouth 2 (two) times daily. 1 po bid 02/17/22  Yes [name], [name]  ?esomeprazole (NEXIUM) 40 MG capsule Take 1 capsule (40 mg total) by mouth daily. 04/23/21  Yes [name], [name]  ?FLUoxetine (PROZAC) 40 MG capsule Take 2 capsules (80 mg total) by mouth daily. TAKE ONE (1) CAPSULE EACH DAY ?Patient taking differently: Take 80 mg by mouth daily. 04/23/21  Yes [name], [name]  ?ipratropium-albuterol (DUONEB) 0.5-2.5 (3) MG/3ML SOLN Take 3 mLs by nebulization every 6 (six) hours as needed (Foir shortness of breath). 01/01/18  Yes [provider]  ?LORazepam (ATIVAN) 0.5 MG tablet Take 1 tablet (0.5 mg total) by mouth every 8 (eight) hours as needed for anxiety. 02/17/22  Yes [name], [name]  ?magnesium oxide (MAG-OX) 400 MG tablet Take 400 mg by mouth daily. 01/23/18  Yes [provider]  ?metFORMIN (GLUCOPHAGE) 1000 MG tablet Take 1 tablet (1,000 mg total) by mouth 2 (two) times daily. 04/23/21  Yes [name], [name]  ?metoprolol tartrate (LOPRESSOR) 50 MG tablet Take 1 tablet (50 mg total) by mouth 2 (two) times daily. 04/23/21  Yes [name], [name]  ?Multiple Vitamin (MULTIVITAMIN WITH MINERALS) TABS tablet Take 1 tablet  by mouth daily.   Yes [provider]  ?nitroGLYCERIN (NITROSTAT) 0.4 MG SL tablet Place 1 tablet (0.4 mg total) under the tongue every 5 (five) minutes as needed for chest pain. 06/19/19 03/01/22 Yes Duke, Tami Lin, PA  ?ondansetron (ZOFRAN ODT) 8 MG disintegrating tablet Take 1 tablet (8 mg total) by mouth every 8 (eight) hours as needed for nausea or vomiting. 12/30/19  Yes Terald Sleeper, PA-C  ?Probiotic Product (RA PROBIOTIC GUMMIES) CHEW Chew 1 each by mouth at bedtime.   Yes  [provider]  ?blood glucose meter kit and supplies Dispense based on patient and insurance preference. Use up to four times daily as directed. (FOR ICD-10 E10.9, E11.9). 11/04/19   [name], [name]  ?HYDROcodone-acetaminophen (NORCO/VICODIN) 5-325 MG tablet Take 1 tablet by mouth every 6 (six) hours as needed for moderate pain. ?Patient not taking: Reported on 03/01/2022 02/17/22   [name], [name]  ?   ? ?Allergies    ?Atorvastatin, Iohexol, Ace inhibitors, Farxiga [dapagliflozin], Heparin, Iodinated contrast media, Penicillins, and Sulfa antibiotics   ? ?Review of Systems   ?Review of Systems  ?All other systems reviewed and are negative. ? ?Physical Exam ?Updated Vital Signs ?BP 139/76   Pulse 69   Temp 97.8 ?F (36.6 ?C) (Oral)   Resp 19   Ht _0  (1.727 m)   Wt 95.7 kg   LMP 06/03/2017 (Exact Date)   SpO2 100%   BMI 32.08 kg/m?  ?Physical Exam ?Vitals and nursing note reviewed.  ?Constitutional:   ?   General: She is not in acute distress. ?   Appearance: Normal appearance. She is well-developed.  ?HENT:  ?   Head: Normocephalic and atraumatic.  ?Eyes:  ?   Conjunctiva/sclera: Conjunctivae normal.  ?   Pupils: Pupils are equal, round, and reactive to light.  ?Cardiovascular:  ?   Rate and Rhythm: Normal rate and regular rhythm.  ?   Heart sounds: Normal heart sounds.  ?Pulmonary:  ?   Effort: Pulmonary effort is normal. No respiratory distress.  ?   Breath sounds: Normal breath sounds.  ?Abdominal:  ?   General: There is no distension.  ?   Palpations: Abdomen is soft.  ?   Tenderness: There is no abdominal tenderness.  ?Musculoskeletal:     ?   General: No deformity. Normal range of motion.  ?   Cervical back: Normal range of motion and neck supple.  ?Skin: ?   General: Skin is warm and dry.  ?Neurological:  ?   General: No focal deficit present.  ?   Mental Status: She is alert and oriented to person, place, and time.  ?   GCS: GCS eye subscore is 4. GCS verbal subscore is  5. GCS motor subscore is 6.  ?   Cranial Nerves: No cranial nerve deficit, dysarthria or facial asymmetry.  ?   Sensory: No sensory deficit.  ?   Motor: No weakness.  ?   Coordination: Coordination normal.  ?   Gait: Gait normal.  ? ? ?ED Results / Procedures / Treatments   ?Labs ?(all labs ordered are listed, but only abnormal results are displayed) ?Labs Reviewed  ?COMPREHENSIVE METABOLIC PANEL - Abnormal; Notable for the following components:  ?    Result Value  ? Sodium 134 (*)   ? CO2 20 (*)   ? Glucose, Bld 183 (*)   ? Creatinine, Ser 1.33 (*)   ? Calcium 8.7 (*)   ? GFR, Estimated  48 (*)   ? All other components within normal limits  ?CBC WITH DIFFERENTIAL/PLATELET - Abnormal; Notable for the following components:  ? Abs Immature Granulocytes 0.08 (*)   ? All other components within normal limits  ? ? ?EKG ?None ? ?Radiology ?No results found. ? ?Procedures ?Procedures  ? ? ?Medications Ordered in ED ?Medications  ?sodium chloride 0.9 % bolus 1,000 mL (1,000 mLs Intravenous New Bag/Given 03/01/22 1534)  ? ? ?ED Course/ Medical Decision Making/ A&P ?  ?                        ?Medical Decision Making ?Amount and/or Complexity of Data Reviewed ?Labs: ordered. ? ? ? ?Medical Screen Complete ? 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 ? ?This complaint involves an extensive number of treatment options. The initial differential diagnosis includes, but is not limited to, metabolic abnormality, dehydration, acute infection ? ?This presentation is: Acute, Chronic, Self-Limited, Previously Undiagnosed, Uncertain Prognosis, Complicated, Systemic Symptoms, and Threat to Life/Bodily Function ? ?Patient is presenting with complaint of continued malaise, fatigue, diarrhea, mild nausea. ? ?She is completing a course of doxycycline after being diagnosed with possible RMSF. ? ?Patient was seen on May 4 for several weeks of joint pain.  She did endorse to her PCP  tick bites. ? ?Lyme titers were negative.  RMSF IgG was positive.  Patient was started on a Doxy course. ? ?Patient is exam today is without significant abnormality. ? ?Labs demonstrate mild dehydration. ? ?I suspect that

## 2022-03-01 NOTE — Discharge Instructions (Addendum)
Return for any problem. ? ?Drink plenty of fluids. ? ?If you can, finish your remaining doses of doxycycline. ?

## 2022-03-18 ENCOUNTER — Other Ambulatory Visit: Payer: Self-pay | Admitting: Nurse Practitioner

## 2022-03-18 ENCOUNTER — Ambulatory Visit: Payer: Managed Care, Other (non HMO) | Admitting: Nurse Practitioner

## 2022-03-18 DIAGNOSIS — I10 Essential (primary) hypertension: Secondary | ICD-10-CM

## 2022-03-21 ENCOUNTER — Other Ambulatory Visit: Payer: Self-pay | Admitting: Nurse Practitioner

## 2022-03-24 ENCOUNTER — Ambulatory Visit: Payer: Managed Care, Other (non HMO) | Admitting: Nurse Practitioner

## 2022-03-24 ENCOUNTER — Encounter: Payer: Self-pay | Admitting: Nurse Practitioner

## 2022-03-24 NOTE — Progress Notes (Deleted)
Subjective:    Patient ID: [name], female    DOB: 03/01/1966, 56 y.o.   MRN: [id number]   Chief Complaint: medical management of chronic issues    HPI  Chief Complaint: No chief complaint on file.    HPI:  ZOA DOWTY is a 56 y.o. who identifies as a female who was assigned female at birth.   Social history: Lives with: husband and son Work history: works in Press photographer at [employer] in today for follow up of the following chronic medical issues:  1. Type 2 diabetes mellitus with hyperglycemia, without long-term current use of insulin (HCC) Does not check blood sugars daily and does not watch diet.  Lab Results  Component Value Date   HGBA1C 7.0 (H) 04/23/2021     2. Essential hypertension No c/o chest pain, sob or headache. Does not check blood pressure at home. BP Readings from Last 3 Encounters:  03/01/22 140/78  02/17/22 124/80  01/17/22 137/70    3. Coronary artery disease involving native coronary artery of native heart with angina pectoris [facility]) Last saw cardiology over a year ago.  4. Mixed hyperlipidemia Does not watch diet and does no dedicated exercise.  Refuses statin therapy because it makes her have myalgia. Lab Results  Component Value Date   CHOL 277 (H) 04/23/2021   HDL 28 (L) 04/23/2021   LDLCALC 148 (H) 04/23/2021   TRIG 530 (H) 04/23/2021   CHOLHDL 9.9 (H) 04/23/2021     5. Gastroesophageal reflux disease without esophagitis Is on nexium daily and is doing well.  6. Mucopurulent chronic bronchitis (HCC) Has occasional cough  7. Recurrent major depressive disorder, in partial remission ([place]) Is on prozac daily. She has been doing well  8. GAD (generalized anxiety disorder) Is on ativan. Shre takes her ativan BID every day. Has a very stress ful job and she has been helping take care of her ailing  mom.  9. Tobacco abuse Smokes over a pack a day  10. Obesity with body mass index of 30.0-39.9 No  recent weight changes Wt Readings from Last 3 Encounters:  03/01/22 211 lb (95.7 kg)  02/17/22 211 lb (95.7 kg)  01/17/22 209 lb 9.6 oz (95.1 kg)   BMI Readings from Last 3 Encounters:  03/01/22 32.08 kg/m  02/17/22 32.08 kg/m  01/17/22 31.87 kg/m       New complaints: None today  Allergies  Allergen Reactions   Atorvastatin Other (See Comments)    Myalgia    Iohexol Itching, Rash and Other (See Comments)    Desc: White blisters in mouth during ivp in [place] '93, ok w/ 13 hour prep today//a.calhoun, Onset Date: 10/30/1991    Ace Inhibitors Cough   Farxiga [Dapagliflozin]     UTI, heart palpitations   Heparin Other (See Comments)    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   Sulfa Antibiotics Itching, Rash and Other (See Comments)   Outpatient Encounter Medications as of  03/24/2022  Medication Sig   acetaminophen (TYLENOL) 500 MG tablet Take 500 mg by mouth every 6 (six) hours as needed for mild pain.   albuterol (VENTOLIN HFA) 108 (90 Base) MCG/ACT inhaler Inhale 2 puffs into the lungs every 4 (four) hours as needed for wheezing or shortness of breath.   aspirin EC 81 MG tablet Take 81 mg by mouth every evening.    blood glucose meter kit and supplies Dispense based on patient and insurance preference. Use up to four times daily as directed. (FOR ICD-10 E10.9, E11.9).   cyclobenzaprine (FLEXERIL) 10 MG tablet Take 1 tablet (10 mg total) by mouth 3  (three) times daily as needed for muscle spasms.   diphenhydrAMINE (BENADRYL) 25 MG tablet Take 25 mg by mouth every 6 (six) hours as needed for itching or allergies.   doxycycline (VIBRA-TABS) 100 MG tablet Take 1 tablet (100 mg total) by mouth 2 (two) times daily. 1 po bid   esomeprazole (NEXIUM) 40 MG capsule Take 1 capsule (40 mg total) by mouth daily.   FLUoxetine (PROZAC) 40 MG capsule Take 2 capsules (80 mg total) by mouth daily. TAKE ONE (1) CAPSULE EACH DAY (Patient taking differently: Take 80 mg by mouth daily.)   HYDROcodone-acetaminophen (NORCO/VICODIN) 5-325 MG tablet Take 1 tablet by mouth every 6 (six) hours as needed for moderate pain. (Patient not taking: Reported on 03/01/2022)   ipratropium-albuterol (DUONEB) 0.5-2.5 (3) MG/3ML SOLN Take 3 mLs by nebulization every 6 (six) hours as needed (Foir shortness of breath).   LORazepam (ATIVAN) 0.5 MG tablet Take 1 tablet (0.5 mg total) by mouth every 8 (eight) hours as needed for anxiety.   magnesium oxide (MAG-OX) 400 MG tablet Take 400 mg by mouth daily.   metFORMIN (GLUCOPHAGE) 1000 MG tablet Take 1 tablet (1,000 mg total) by mouth 2 (two) times daily.   metoprolol tartrate (LOPRESSOR) 50 MG tablet Take 1 tablet (50 mg total) by mouth 2 (two) times daily.   Multiple Vitamin (MULTIVITAMIN WITH MINERALS) TABS tablet Take 1 tablet by mouth daily.   nitroGLYCERIN (NITROSTAT) 0.4 MG SL tablet Place 1 tablet (0.4 mg total) under the tongue every 5 (five) minutes as needed for chest pain.   ondansetron (ZOFRAN ODT) 8 MG disintegrating tablet Take 1 tablet (8 mg total) by mouth every 8 (eight) hours as needed for nausea or vomiting.   Probiotic Product (RA PROBIOTIC GUMMIES) CHEW Chew 1 each by mouth at bedtime.   No facility-administered encounter medications on file as of 03/24/2022.    Past Surgical History:  Procedure Laterality Date   CARDIAC CATHETERIZATION N/A 10/29/2015   Procedure: Left Heart Cath and Coronary Angiography;  Surgeon:  [name], MD;  Location: [facility];  Service: Cardiovascular;  Laterality: N/A;   CHOLECYSTECTOMY     CORONARY ARTERY BYPASS GRAFT N/A 11/02/2015   Procedure: CORONARY ARTERY BYPASS GRAFTING (CABG)x 1 using left internal mammary artery.;  Surgeon: Melrose Nakayama, MD;  Location: [facility];  Service: Open Heart Surgery;  Laterality: N/A;   LEFT OOPHORECTOMY Left    Related to tubal torsion.  Bilateral salpingectomy also.  Remaining uterus and right ovary   LUMBAR FUSION     TEE WITHOUT CARDIOVERSION N/A 11/02/2015   Procedure: TRANSESOPHAGEAL ECHOCARDIOGRAM (TEE);  Surgeon: Melrose Nakayama, MD;  Location: [facility];  Service: Open Heart Surgery;  Laterality: N/A;    Family History  Problem Relation Age of Onset   Colon polyps Father    Stomach cancer  Paternal Grandmother    Esophageal cancer Maternal Grandfather    Breast cancer Mother    Ovarian cancer Other        Maternal Side Great  Aunt   Colon cancer Neg Hx       Controlled substance contract: ***      Review of Systems     Objective:   Physical Exam        Assessment & Plan:

## 2022-05-02 ENCOUNTER — Other Ambulatory Visit: Payer: Self-pay | Admitting: Nurse Practitioner

## 2022-05-02 DIAGNOSIS — F411 Generalized anxiety disorder: Secondary | ICD-10-CM

## 2022-05-02 NOTE — Telephone Encounter (Signed)
Appt made for 05/06/2022

## 2022-05-02 NOTE — Telephone Encounter (Signed)
Ntbs for refills on controlled substances-please schedule appt

## 2022-05-06 ENCOUNTER — Ambulatory Visit (INDEPENDENT_AMBULATORY_CARE_PROVIDER_SITE_OTHER): Payer: Managed Care, Other (non HMO) | Admitting: Nurse Practitioner

## 2022-05-06 ENCOUNTER — Encounter: Payer: Self-pay | Admitting: Nurse Practitioner

## 2022-05-06 VITALS — BP 144/82 | HR 71 | Temp 98.0°F | Resp 20 | Ht 68.0 in | Wt 206.0 lb

## 2022-05-06 DIAGNOSIS — N393 Stress incontinence (female) (male): Secondary | ICD-10-CM

## 2022-05-06 DIAGNOSIS — E1165 Type 2 diabetes mellitus with hyperglycemia: Secondary | ICD-10-CM | POA: Diagnosis not present

## 2022-05-06 DIAGNOSIS — J411 Mucopurulent chronic bronchitis: Secondary | ICD-10-CM

## 2022-05-06 DIAGNOSIS — I10 Essential (primary) hypertension: Secondary | ICD-10-CM | POA: Diagnosis not present

## 2022-05-06 DIAGNOSIS — F3341 Major depressive disorder, recurrent, in partial remission: Secondary | ICD-10-CM

## 2022-05-06 DIAGNOSIS — Z86711 Personal history of pulmonary embolism: Secondary | ICD-10-CM

## 2022-05-06 DIAGNOSIS — F411 Generalized anxiety disorder: Secondary | ICD-10-CM

## 2022-05-06 DIAGNOSIS — Z720 Tobacco use: Secondary | ICD-10-CM

## 2022-05-06 DIAGNOSIS — I25119 Atherosclerotic heart disease of native coronary artery with unspecified angina pectoris: Secondary | ICD-10-CM | POA: Diagnosis not present

## 2022-05-06 DIAGNOSIS — J189 Pneumonia, unspecified organism: Secondary | ICD-10-CM

## 2022-05-06 DIAGNOSIS — E782 Mixed hyperlipidemia: Secondary | ICD-10-CM | POA: Diagnosis not present

## 2022-05-06 DIAGNOSIS — D508 Other iron deficiency anemias: Secondary | ICD-10-CM

## 2022-05-06 DIAGNOSIS — K219 Gastro-esophageal reflux disease without esophagitis: Secondary | ICD-10-CM

## 2022-05-06 DIAGNOSIS — E669 Obesity, unspecified: Secondary | ICD-10-CM

## 2022-05-06 LAB — CMP14+EGFR
ALT: 29 IU/L (ref 0–32)
AST: 33 IU/L (ref 0–40)
Albumin/Globulin Ratio: 1.5 (ref 1.2–2.2)
Albumin: 4 g/dL (ref 3.8–4.9)
Alkaline Phosphatase: 90 IU/L (ref 44–121)
BUN/Creatinine Ratio: 8 — ABNORMAL LOW (ref 9–23)
BUN: 8 mg/dL (ref 6–24)
Bilirubin Total: 0.3 mg/dL (ref 0.0–1.2)
CO2: 21 mmol/L (ref 20–29)
Calcium: 8.7 mg/dL (ref 8.7–10.2)
Chloride: 100 mmol/L (ref 96–106)
Creatinine, Ser: 1.02 mg/dL — ABNORMAL HIGH (ref 0.57–1.00)
Globulin, Total: 2.7 g/dL (ref 1.5–4.5)
Glucose: 184 mg/dL — ABNORMAL HIGH (ref 70–99)
Potassium: 4 mmol/L (ref 3.5–5.2)
Sodium: 138 mmol/L (ref 134–144)
Total Protein: 6.7 g/dL (ref 6.0–8.5)
eGFR: 66 mL/min/{1.73_m2} (ref >59)

## 2022-05-06 LAB — CBC WITH DIFFERENTIAL/PLATELET
Basophils Absolute: 0.1 10*3/uL (ref 0.0–0.2)
Basos: 1 %
EOS (ABSOLUTE): 0.2 10*3/uL (ref 0.0–0.4)
Eos: 2 %
Hematocrit: 38.3 % (ref 34.0–46.6)
Hemoglobin: 12.6 g/dL (ref 11.1–15.9)
Immature Grans (Abs): 0 10*3/uL (ref 0.0–0.1)
Immature Granulocytes: 1 %
Lymphocytes Absolute: 2.2 10*3/uL (ref 0.7–3.1)
Lymphs: 26 %
MCH: 29.7 pg (ref 26.6–33.0)
MCHC: 32.9 g/dL (ref 31.5–35.7)
MCV: 90 fL (ref 79–97)
Monocytes Absolute: 0.6 10*3/uL (ref 0.1–0.9)
Monocytes: 7 %
Neutrophils Absolute: 5.4 10*3/uL (ref 1.4–7.0)
Neutrophils: 63 %
Platelets: 250 10*3/uL (ref 150–450)
RBC: 4.24 x10E6/uL (ref 3.77–5.28)
RDW: 13 % (ref 11.7–15.4)
WBC: 8.4 10*3/uL (ref 3.4–10.8)

## 2022-05-06 LAB — LIPID PANEL
Chol/HDL Ratio: 10.3 ratio — ABNORMAL HIGH (ref 0.0–4.4)
Cholesterol, Total: 278 mg/dL — ABNORMAL HIGH (ref 100–199)
HDL: 27 mg/dL — ABNORMAL LOW (ref >39)
LDL Chol Calc (NIH): 182 mg/dL — ABNORMAL HIGH (ref 0–99)
Triglycerides: 349 mg/dL — ABNORMAL HIGH (ref 0–149)
VLDL Cholesterol Cal: 69 mg/dL — ABNORMAL HIGH (ref 5–40)

## 2022-05-06 LAB — BAYER DCA HB A1C WAIVED: HB A1C (BAYER DCA - WAIVED): 7.2 % — ABNORMAL HIGH (ref 4.8–5.6)

## 2022-05-06 MED ORDER — METFORMIN HCL 1000 MG PO TABS: 1000.0000 mg | ORAL_TABLET | Freq: Two times a day (BID) | ORAL | 1 refills | Status: DC

## 2022-05-06 MED ORDER — ONDANSETRON 8 MG PO TBDP: 8.0000 mg | ORAL_TABLET | Freq: Three times a day (TID) | ORAL | 0 refills | Status: DC | PRN

## 2022-05-06 MED ORDER — FLUOXETINE HCL 40 MG PO CAPS: 80.0000 mg | ORAL_CAPSULE | Freq: Every day | ORAL | 1 refills | Status: DC

## 2022-05-06 MED ORDER — METOPROLOL TARTRATE 50 MG PO TABS: 50.0000 mg | ORAL_TABLET | Freq: Two times a day (BID) | ORAL | 1 refills | Status: DC

## 2022-05-06 MED ORDER — HYDROCODONE-ACETAMINOPHEN 5-325 MG PO TABS: 1.0000 | ORAL_TABLET | Freq: Four times a day (QID) | ORAL | 0 refills | Status: DC | PRN

## 2022-05-06 MED ORDER — ESOMEPRAZOLE MAGNESIUM 40 MG PO CPDR: 40.0000 mg | DELAYED_RELEASE_CAPSULE | Freq: Every day | ORAL | 1 refills | Status: DC

## 2022-05-06 MED ORDER — LORAZEPAM 0.5 MG PO TABS: 0.5000 mg | ORAL_TABLET | Freq: Three times a day (TID) | ORAL | 2 refills | Status: DC | PRN

## 2022-05-06 MED ORDER — MAGNESIUM OXIDE 400 MG PO TABS: 400.0000 mg | ORAL_TABLET | Freq: Every day | ORAL | 1 refills | Status: DC

## 2022-05-06 NOTE — Patient Instructions (Signed)
Diabetes Mellitus and Foot Care Foot care is an important part of your health, especially when you have diabetes. Diabetes may cause you to have problems because of poor blood flow (circulation) to your feet and legs, which can cause your skin to: Become thinner and drier. Break more easily. Heal more slowly. Peel and crack. You may also have nerve damage (neuropathy) in your legs and feet, causing decreased feeling in them. This means that you may not notice minor injuries to your feet that could lead to more serious problems. Noticing and addressing any potential problems early is the best way to prevent future foot problems. How to care for your feet Foot hygiene  Wash your feet daily with warm water and mild soap. Do not use hot water. Then, pat your feet and the areas between your toes until they are completely dry. Do not soak your feet as this can dry your skin. Trim your toenails straight across. Do not dig under them or around the cuticle. File the edges of your nails with an emery board or nail file. Apply a moisturizing lotion or petroleum jelly to the skin on your feet and to dry, brittle toenails. Use lotion that does not contain alcohol and is unscented. Do not apply lotion between your toes. Shoes and socks Wear clean socks or stockings every day. Make sure they are not too tight. Do not wear knee-high stockings since they may decrease blood flow to your legs. Wear shoes that fit properly and have enough cushioning. Always look in your shoes before you put them on to be sure there are no objects inside. To break in new shoes, wear them for just a few hours a day. This prevents injuries on your feet. Wounds, scrapes, corns, and calluses  Check your feet daily for blisters, cuts, bruises, sores, and redness. If you cannot see the bottom of your feet, use a mirror or ask someone for help. Do not cut corns or calluses or try to remove them with medicine. If you find a minor scrape,  cut, or break in the skin on your feet, keep it and the skin around it clean and dry. You may clean these areas with mild soap and water. Do not clean the area with peroxide, alcohol, or iodine. If you have a wound, scrape, corn, or callus on your foot, look at it several times a day to make sure it is healing and not infected. Check for: Redness, swelling, or pain. Fluid or blood. Warmth. Pus or a bad smell. General tips Do not cross your legs. This may decrease blood flow to your feet. Do not use heating pads or hot water bottles on your feet. They may burn your skin. If you have lost feeling in your feet or legs, you may not know this is happening until it is too late. Protect your feet from hot and cold by wearing shoes, such as at the beach or on hot pavement. Schedule a complete foot exam at least once a year (annually) or more often if you have foot problems. Report any cuts, sores, or bruises to your health care provider immediately. Where to find more information American Diabetes Association: www.diabetes.org Association of Diabetes Care & Education Specialists: www.diabeteseducator.org Contact a health care provider if: You have a medical condition that increases your risk of infection and you have any cuts, sores, or bruises on your feet. You have an injury that is not healing. You have redness on your legs or feet. You   feel burning or tingling in your legs or feet. You have pain or cramps in your legs and feet. Your legs or feet are numb. Your feet always feel cold. You have pain around any toenails. Get help right away if: You have a wound, scrape, corn, or callus on your foot and: You have pain, swelling, or redness that gets worse. You have fluid or blood coming from the wound, scrape, corn, or callus. Your wound, scrape, corn, or callus feels warm to the touch. You have pus or a bad smell coming from the wound, scrape, corn, or callus. You have a fever. You have a red  line going up your leg. Summary Check your feet every day for blisters, cuts, bruises, sores, and redness. Apply a moisturizing lotion or petroleum jelly to the skin on your feet and to dry, brittle toenails. Wear shoes that fit properly and have enough cushioning. If you have foot problems, report any cuts, sores, or bruises to your health care provider immediately. Schedule a complete foot exam at least once a year (annually) or more often if you have foot problems. This information is not intended to replace advice given to you by your health care provider. Make sure you discuss any questions you have with your health care provider. Document Revised: 04/23/2020 Document Reviewed: 04/23/2020 Elsevier Patient Education  2023 Elsevier Inc.  

## 2022-05-06 NOTE — Progress Notes (Signed)
Subjective:    Patient ID: Brenda Cruz, female    DOB: 13-Jun-1966, 56 y.o.   MRN: [id number]  Chief Complaint: medical management of chronic issues     HPI:  Brenda Cruz is a 56 y.o. who identifies as a female who was assigned female at birth.   Social history: Lives with: her husband and son Work history: works at [employer] in today for follow up of the following chronic medical issues:  1. Essential hypertension No c/o chest pain , sob or headache. Does check blood pressure when she feels bad. Usually  in 916'X systolic unless sheis really stressed and it will run high. BP Readings from Last 3 Encounters:  05/06/22 (!) 191/83  03/01/22 140/78  02/17/22 124/80     2. Type 2 diabetes mellitus with hyperglycemia, without long-term current use of insulin (HCC) Fasting blood sugars have not been checked lates. She says she is to stressed to check. Feels like it is low sometimes and she will just eat something. Lab Results  Component Value Date   HGBA1C 7.0 (H) 04/23/2021     3. Mixed hyperlipidemia Has had a poor appetite lately' Lab Results  Component Value Date   CHOL 277 (H) 04/23/2021   HDL 28 (L) 04/23/2021   LDLCALC 148 (H) 04/23/2021   TRIG 530 (H) 04/23/2021   CHOLHDL 9.9 (H) 04/23/2021     4. Coronary artery disease involving native coronary artery of native heart with angina pectoris ([place]) Has not seen cardiology in awhile. Says she doe snot feel she needs to see them right now.   5. Mucopurulent chronic bronchitis ([place]) Doing well. No sob  6. Gastroesophageal reflux disease without esophagitis No recent reflux. Nexium works well for her  7. Stress incontinence of urine No recent issues  8. Recurrent major depressive disorder, in partial remission ([place]) Has been real depressed since the passing of her mom several weeks ago. IS on prozac.    05/06/2022    9:10 AM 02/17/2022   11:46 AM 01/17/2022    3:06 PM  Depression  screen PHQ 2/9  Decreased Interest 3 0 1  Down, Depressed, Hopeless 3 0 1  PHQ - 2 Score 6 0 2  Altered sleeping _0 Tired, decreased energy _1 Change in appetite 1 0 0  Feeling bad or failure about yourself  3 0 1  Trouble concentrating 3 0 1  Moving slowly or fidgety/restless 3 0 0  Suicidal thoughts 0 0 0  PHQ-9 Score _2 Difficult doing work/chores Extremely dIfficult Somewhat difficult Somewhat difficult     9. GAD (generalized anxiety disorder) Is on ativan tid.    05/06/2022    9:11 AM 02/17/2022   11:47 AM 01/17/2022    3:07 PM 04/23/2021    2:42 PM  GAD 7 : Generalized Anxiety Score  Nervous, Anxious, on Edge _3 Control/stop worrying _4 Worry too much - different things _5 Trouble relaxing _6 Restless _7 0  Easily annoyed or irritable _8 Afraid - awful might happen _9 Total GAD 7 Score _10 Anxiety Difficulty Extremely difficult Somewhat difficult Somewhat difficult Somewhat difficult      10. Iron deficiency anemia secondary to inadequate dietary iron intake Fatigue bit thinks is due to stress.  Lab Results  Component Value Date   HGB 13.3 03/01/2022     11. Hx of pulmonary embolus No symptoms  12. Tobacco abuse Smoking over a pack a day  13. Obesity with body mass index of 30.0-39.9 Weight is down 5lbs Wt Readings from Last 3 Encounters:  05/06/22 206 lb (93.4 kg)  03/01/22 211 lb (95.7 kg)  02/17/22 211 lb (95.7 kg)   BMI Readings from Last 3 Encounters:  05/06/22 31.32 kg/m  03/01/22 32.08 kg/m  02/17/22 32.08 kg/m      New complaints: None today  Allergies  Allergen Reactions   Atorvastatin Other (See Comments)    Myalgia    Iohexol Itching, Rash and Other (See Comments)    Desc: White blisters in mouth during ivp in [place] '93, ok w/ 13 hour prep today//a.calhoun, Onset Date: 10/30/1991    Ace Inhibitors Cough   Farxiga [Dapagliflozin]     UTI, heart palpitations    Heparin Other (See Comments)    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   Sulfa Antibiotics Itching, Rash and Other (See Comments)   Outpatient Encounter Medications as of 05/06/2022  Medication Sig   acetaminophen (TYLENOL) 500 MG tablet Take 500 mg by mouth every 6 (six) hours as needed for mild pain.   albuterol (VENTOLIN HFA) 108 (90 Base) MCG/ACT inhaler Inhale 2 puffs into the lungs every 4 (four) hours as needed for wheezing or shortness of breath.   aspirin EC 81 MG tablet Take 81 mg by mouth every evening.    blood glucose meter kit and supplies Dispense based on patient and insurance preference. Use up to four times daily as directed. (FOR ICD-10 E10.9, E11.9).   cyclobenzaprine (FLEXERIL) 10 MG tablet Take 1 tablet (10 mg total) by mouth 3 (three) times daily as needed for muscle spasms.   diphenhydrAMINE (BENADRYL) 25 MG tablet Take 25 mg by mouth every 6 (six) hours as needed for itching or allergies.   esomeprazole (NEXIUM) 40 MG capsule Take 1 capsule (40 mg total) by mouth daily.   FLUoxetine (PROZAC) 40 MG capsule Take 2 capsules (80 mg total) by mouth daily. TAKE ONE (1) CAPSULE EACH DAY (Patient taking differently: Take 80 mg by mouth daily.)   HYDROcodone-acetaminophen (NORCO/VICODIN) 5-325 MG tablet Take 1 tablet by mouth every 6 (six) hours as needed for moderate pain.   ipratropium-albuterol (DUONEB) 0.5-2.5 (3) MG/3ML SOLN Take 3 mLs by nebulization every  6 (six) hours as needed ([place] shortness of breath).   LORazepam (ATIVAN) 0.5 MG tablet Take 1 tablet (0.5 mg total) by mouth every 8 (eight) hours as needed for anxiety.   magnesium oxide (MAG-OX) 400 MG tablet Take 400 mg by mouth daily.   metFORMIN (GLUCOPHAGE) 1000 MG tablet Take 1 tablet (1,000 mg total) by mouth 2 (two) times daily.   metoprolol tartrate (LOPRESSOR) 50 MG tablet Take 1 tablet (50 mg total) by mouth 2 (two) times daily.   Multiple Vitamin (MULTIVITAMIN WITH MINERALS) TABS  tablet Take 1 tablet by mouth daily.   ondansetron (ZOFRAN ODT) 8 MG disintegrating tablet Take 1 tablet (8 mg total) by mouth every 8 (eight) hours as needed for nausea or vomiting.   Probiotic Product (RA PROBIOTIC GUMMIES) CHEW Chew 1 each by mouth at bedtime.   [DISCONTINUED] doxycycline (VIBRA-TABS) 100 MG tablet Take 1 tablet (100 mg total) by mouth 2 (two) times daily. 1 po bid   nitroGLYCERIN (NITROSTAT) 0.4 MG SL tablet Place 1 tablet (0.4 mg total) under the tongue every 5 (five) minutes as needed for chest pain.   No facility-administered encounter medications on file as of 05/06/2022.    Past Surgical History:  Procedure Laterality Date   CARDIAC CATHETERIZATION N/A 10/29/2015   Procedure: Left Heart Cath and Coronary Angiography;  Surgeon: [name], MD;  Location: [facility];  Service: Cardiovascular;  Laterality: N/A;   CHOLECYSTECTOMY     CORONARY ARTERY BYPASS GRAFT N/A 11/02/2015   Procedure: CORONARY ARTERY BYPASS GRAFTING (CABG)x 1 using left internal mammary artery.;  Surgeon: Melrose Nakayama, MD;  Location: [facility];  Service: Open Heart Surgery;  Laterality: N/A;   LEFT OOPHORECTOMY Left    Related to tubal torsion.  Bilateral salpingectomy also.  Remaining uterus and right ovary   LUMBAR FUSION     TEE WITHOUT CARDIOVERSION N/A 11/02/2015   Procedure: TRANSESOPHAGEAL ECHOCARDIOGRAM (TEE);  Surgeon: Melrose Nakayama, MD;  Location: [facility];  Service: Open Heart  Surgery;  Laterality: N/A;    Family History  Problem Relation Age of Onset   Colon polyps Father    Stomach cancer Paternal Grandmother    Esophageal cancer Maternal Grandfather    Breast cancer Mother    Ovarian cancer Other        Maternal Side Great  Aunt   Colon cancer Neg Hx       Controlled substance contract: n/a     Review of Systems  Constitutional:  Negative for diaphoresis.  Eyes:  Negative for pain.  Respiratory:  Negative for shortness of breath.   Cardiovascular:  Negative for chest pain, palpitations and leg swelling.  Gastrointestinal:  Negative for abdominal pain.  Endocrine: Negative for polydipsia.  Skin:  Negative for rash.  Neurological:  Negative for dizziness, weakness and headaches.  Hematological:  Does not bruise/bleed easily.  All other systems reviewed and are negative.      Objective:   Physical Exam Vitals and nursing note reviewed.  Constitutional:      General: She is not in acute distress.    Appearance: Normal appearance. She is well-developed.  HENT:     Head: Normocephalic.     Right Ear: Tympanic membrane normal.     Left Ear: Tympanic membrane normal.     Nose: Nose normal.     Mouth/Throat:     Mouth: Mucous membranes are moist.  Eyes:     Pupils: Pupils are equal, round, and reactive to light.  Neck:     Vascular: No carotid bruit or JVD.  Cardiovascular:     Rate and Rhythm: Normal rate and regular rhythm.     Heart sounds: Normal heart sounds.  Pulmonary:     Effort: Pulmonary effort is normal. No respiratory distress.     Breath sounds: Normal breath sounds. No wheezing or rales.  Chest:     Chest wall: No tenderness.  Abdominal:     General: Bowel sounds are normal. There is no distension or abdominal bruit.  Palpations: Abdomen is soft. There is no hepatomegaly, splenomegaly, mass or pulsatile mass.     Tenderness: There is no abdominal tenderness.  Musculoskeletal:        General: Normal range of  motion.     Cervical back: Normal range of motion and neck supple.  Lymphadenopathy:     Cervical: No cervical adenopathy.  Skin:    General: Skin is warm and dry.  Neurological:     Mental Status: She is alert and oriented to person, place, and time.     Deep Tendon Reflexes: Reflexes are normal and symmetric.  Psychiatric:        Behavior: Behavior normal.        Thought Content: Thought content normal.        Judgment: Judgment normal.    BP (!) 191/83   Pulse 71   Temp 98 F (36.7 C) (Temporal)   Resp 20   Ht _0  (1.727 m)   Wt 206 lb (93.4 kg)   LMP 06/03/2017 (Exact Date)   SpO2 98%   BMI 31.32 kg/m    Hgab1c 7.2%     Assessment & Plan:   Brenda Cruz comes in today with chief complaint of No chief complaint on file.   Diagnosis and orders addressed:  1. Essential hypertension Low sodium diet - CBC with Differential/Platelet - CMP14+EGFR - metoprolol tartrate (LOPRESSOR) 50 MG tablet; Take 1 tablet (50 mg total) by mouth 2 (two) times daily.  Dispense: 180 tablet; Refill: 1  2. Type 2 diabetes mellitus with hyperglycemia, without long-term current use of insulin (HCC) Continue to watch carbs in diet - Bayer DCA Hb A1c Waived - metFORMIN (GLUCOPHAGE) 1000 MG tablet; Take 1 tablet (1,000 mg total) by mouth 2 (two) times daily.  Dispense: 180 tablet; Refill: 1  3. Mixed hyperlipidemia Low fat diet - Lipid panel  4. Coronary artery disease involving native coronary artery of native heart with angina pectoris ([place]) Needs to follow up with cardiology - magnesium oxide (MAG-OX) 400 MG tablet; Take 1 tablet (400 mg total) by mouth daily.  Dispense: 90 tablet; Refill: 1  5. Mucopurulent chronic bronchitis (HCC) - ondansetron (ZOFRAN ODT) 8 MG disintegrating tablet; Take 1 tablet (8 mg total) by mouth every 8 (eight) hours as needed for nausea or vomiting.  Dispense: 30 tablet; Refill: 0 - HYDROcodone-acetaminophen (NORCO/VICODIN) 5-325 MG tablet; Take 1  tablet by mouth every 6 (six) hours as needed for moderate pain.  Dispense: 30 tablet; Refill: 0  6. Gastroesophageal reflux disease without esophagitis Avoid spicy foods Do not eat 2 hours prior to bedtime - esomeprazole (NEXIUM) 40 MG capsule; Take 1 capsule (40 mg total) by mouth daily.  Dispense: 90 capsule; Refill: 1  7. Stress incontinence of urine  8. Recurrent major depressive disorder, in partial remission (HCC) Stress management - FLUoxetine (PROZAC) 40 MG capsule; Take 2 capsules (80 mg total) by mouth daily. TAKE ONE (1) CAPSULE EACH DAY  Dispense: 180 capsule; Refill: 1  9. GAD (generalized anxiety disorder) - LORazepam (ATIVAN) 0.5 MG tablet; Take 1 tablet (0.5 mg total) by mouth every 8 (eight) hours as needed for anxiety.  Dispense: 90 tablet; Refill: 2  10. Iron deficiency anemia secondary to inadequate dietary iron intake Labs pending  11. Hx of pulmonary embolus Report any SOB  12. Tobacco abuse Smoking decreasing if possible  13. Obesity with body mass index of 30.0-39.9 Discussed diet and exercise for person with BMI >25 Will recheck weight in  3-6 months     Labs pending Health Maintenance reviewed Diet and exercise encouraged  Follow up plan: 3 months   [name] [name], [name]

## 2022-05-18 ENCOUNTER — Ambulatory Visit: Payer: Managed Care, Other (non HMO) | Admitting: Family Medicine

## 2022-05-19 ENCOUNTER — Ambulatory Visit (INDEPENDENT_AMBULATORY_CARE_PROVIDER_SITE_OTHER): Payer: Managed Care, Other (non HMO) | Admitting: Nurse Practitioner

## 2022-05-19 ENCOUNTER — Encounter: Payer: Self-pay | Admitting: Nurse Practitioner

## 2022-05-19 VITALS — BP 164/83 | HR 66 | Temp 97.1°F | Resp 20 | Ht 68.0 in | Wt 207.0 lb

## 2022-05-19 DIAGNOSIS — F3341 Major depressive disorder, recurrent, in partial remission: Secondary | ICD-10-CM

## 2022-05-19 MED ORDER — BUPROPION HCL ER (XL) 150 MG PO TB24: 150.0000 mg | ORAL_TABLET | Freq: Every day | ORAL | 1 refills | Status: DC

## 2022-05-19 NOTE — Progress Notes (Signed)
Subjective:    Patient ID: Brenda Cruz, female    DOB: April 23, 1966, 56 y.o.   MRN: [id number]  Chief Complaint: Stress   HPI Patients mom died about 1 month ago and she is not handling it well. She is not sleeping well. Anxiety attacks, shakes, anger issues. Having a lot of trouble focusing. She has an appointment for counseling next week. She has not been  to work in over a week. Has FMLA papers to be filled out. She is on ativan BID andprozac '80mg'$  daily     05/19/2022   12:10 PM 05/06/2022    9:10 AM 02/17/2022   11:46 AM  Depression screen PHQ 2/9  Decreased Interest 3 3 0  Down, Depressed, Hopeless 3 3 0  PHQ - 2 Score 6 6 0  Altered sleeping '3 3 1  '$ Tired, decreased energy '3 3 2  '$ Change in appetite 1 1 0  Feeling bad or failure about yourself  3 3 0  Trouble concentrating 3 3 0  Moving slowly or fidgety/restless 3 3 0  Suicidal thoughts 0 0 0  PHQ-9 Score '22 22 3  '$ Difficult doing work/chores Extremely dIfficult Extremely dIfficult Somewhat difficult      05/19/2022   12:11 PM 05/06/2022    9:11 AM 02/17/2022   11:47 AM 01/17/2022    3:07 PM  GAD 7 : Generalized Anxiety Score  Nervous, Anxious, on Edge '3 3 2 2  '$ Control/stop worrying '3 3 1 1  '$ Worry too much - different things '3 3 1 1  '$ Trouble relaxing '3 3 3 1  '$ Restless '3 3 3 1  '$ Easily annoyed or irritable '3 3 2 1  '$ Afraid - awful might happen '3 3 1 1  '$ Total GAD 7 Score '21 21 13 8  '$ Anxiety Difficulty Extremely difficult Extremely difficult Somewhat difficult Somewhat difficult       Review of Systems  Constitutional:  Negative for diaphoresis.  Eyes:  Negative for pain.  Respiratory:  Negative for shortness of breath.   Cardiovascular:  Negative for chest pain, palpitations and leg swelling.  Gastrointestinal:  Negative for abdominal pain.  Endocrine: Negative for polydipsia.  Skin:  Negative for rash.  Neurological:  Negative for dizziness, weakness and headaches.  Hematological:  Does not bruise/bleed easily.   Psychiatric/Behavioral:  Positive for agitation, dysphoric mood and sleep disturbance. The patient is nervous/anxious.   All other systems reviewed and are negative.      Objective:   Physical Exam Constitutional:      Appearance: Normal appearance.  Cardiovascular:     Rate and Rhythm: Normal rate and regular rhythm.     Heart sounds: Normal heart sounds.  Pulmonary:     Breath sounds: Normal breath sounds.  Skin:    General: Skin is warm.  Neurological:     General: No focal deficit present.     Mental Status: She is alert and oriented to person, place, and time.  Psychiatric:        Mood and Affect: Mood normal.        Behavior: Behavior normal.    BP (!) 164/83   Pulse 66   Temp (!) 97.1 F (36.2 C) (Temporal)   Resp 20   Ht '5\' 8"'$  (1.727 m)   Wt 207 lb (93.9 kg)   LMP 06/03/2017 (Exact Date)   SpO2 97%   BMI 31.47 kg/m         Assessment & Plan:  Brenda Cruz in  today with chief complaint of Stress   1. Recurrent major depressive disorder, in partial remission ([place]) Keep appointment for counseling Stress management Added wellbutrin to meds FMLA papers filled out for out of work for a month - buPROPion (WELLBUTRIN XL) 150 MG 24 hr tablet; Take 1 tablet (150 mg total) by mouth daily.  Dispense: 90 tablet; Refill: 1    The above assessment and management plan was discussed with the patient. The patient verbalized understanding of and has agreed to the management plan. Patient is aware to call the clinic if symptoms persist or worsen. Patient is aware when to return to the clinic for a follow-up visit. Patient educated on when it is appropriate to go to the emergency department.   [name] [name], [name]

## 2022-05-19 NOTE — Patient Instructions (Signed)
Managing Loss, Adult People experience loss in many different ways throughout their lives. Events such as moving, changing jobs, and losing friends can create a sense of loss. The loss may be as serious as a major health change, divorce, death of a pet, or death of a loved one. All of these types of loss are likely to create a physical and emotional reaction known as grief. Grief is the result of a major change or an absence of something or someone that you count on. Grief is a normal reaction to loss. A variety of factors can affect your grieving experience, including: The nature of your loss. Your relationship to what or whom you lost. Your understanding of grief and how to manage it. Your support system. Be aware that when grief becomes extreme, it can lead to more severe issues like isolation, depression, anxiety, or suicidal thoughts. Talk with your health care provider if you have any of these issues. How to manage lifestyle changes Keep to your normal routine as much as possible. If you have trouble focusing or doing normal activities, it is acceptable to take some time away from your normal routine. Spend time with friends and loved ones. Eat a healthy diet, get plenty of sleep, and rest when you feel tired. How to recognize changes  The way that you deal with your grief will affect your ability to function as you normally do. When grieving, you may experience these changes: Numbness, shock, sadness, anxiety, anger, denial, and guilt. Thoughts about death. Unexpected crying. A physical sensation of emptiness in your stomach. Problems sleeping and eating. Tiredness (fatigue). Loss of interest in normal activities. Dreaming about or imagining seeing the person who died. A need to remember what or whom you lost. Difficulty thinking about anything other than your loss for a period of time. Relief. If you have been expecting the loss for a while, you may feel a sense of relief when it  happens. Follow these instructions at home: Activity Express your feelings in healthy ways, such as: Talking with others about your loss. It may be helpful to find others who have had a similar loss, such as a support group. Writing down your feelings in a journal. Doing physical activities to release stress and emotional energy. Doing creative activities like painting, sculpting, or playing or listening to music. Practicing resilience. This is the ability to recover and adjust after facing challenges. Reading some resources that encourage resilience may help you to learn ways to practice those behaviors.  General instructions Be patient with yourself and others. Allow the grieving process to happen, and remember that grieving takes time. It is likely that you may never feel completely done with some grief. You may find a way to move on while still cherishing memories and feelings about your loss. Accepting your loss is a process. It can take months or longer to adjust. Keep all follow-up visits. This is important. Where to find support To get support for managing loss: Ask your health care provider for help and recommendations, such as grief counseling or therapy. Think about joining a support group for people who are managing a loss. Where to find more information You can find more information about managing loss from: American Society of Clinical Oncology: www.cancer.net American Psychological Association: www.apa.org Contact a health care provider if: Your grief is extreme and keeps getting worse. You have ongoing grief that does not improve. Your body shows symptoms of grief, such as illness. You feel depressed, anxious, or   hopeless. Get help right away if: You have thoughts about hurting yourself or others. Get help right away if you feel like you may hurt yourself or others, or have thoughts about taking your own life. Go to your nearest emergency room or: Call 911. Call the  National Suicide Prevention Lifeline at 1-800-273-8255 or 988. This is open 24 hours a day. Text the Crisis Text Line at 741741. Summary Grief is the result of a major change or an absence of someone or something that you count on. Grief is a normal reaction to loss. The depth of grief and the period of recovery depend on the type of loss and your ability to adjust to the change and process your feelings. Processing grief requires patience and a willingness to accept your feelings and talk about your loss with people who are supportive. It is important to find resources that work for you and to realize that people experience grief differently. There is not one grieving process that works for everyone in the same way. Be aware that when grief becomes extreme, it can lead to more severe issues like isolation, depression, anxiety, or suicidal thoughts. Talk with your health care provider if you have any of these issues. This information is not intended to replace advice given to you by your health care provider. Make sure you discuss any questions you have with your health care provider. Document Revised: 05/24/2021 Document Reviewed: 05/24/2021 Elsevier Patient Education  2023 Elsevier Inc.  

## 2022-05-20 NOTE — Telephone Encounter (Signed)
We did not start zoloft- we started wellbutrin '150mg'$  daily and it is to be taken with prozac that currently on.

## 2022-06-09 ENCOUNTER — Encounter: Payer: Self-pay | Admitting: Nurse Practitioner

## 2022-06-09 ENCOUNTER — Ambulatory Visit: Payer: Managed Care, Other (non HMO) | Admitting: Nurse Practitioner

## 2022-06-09 VITALS — BP 170/82 | HR 62 | Temp 97.5°F | Resp 20 | Ht 68.0 in | Wt 206.0 lb

## 2022-06-09 DIAGNOSIS — F430 Acute stress reaction: Secondary | ICD-10-CM | POA: Diagnosis not present

## 2022-06-09 DIAGNOSIS — R0789 Other chest pain: Secondary | ICD-10-CM

## 2022-06-09 DIAGNOSIS — F410 Panic disorder [episodic paroxysmal anxiety] without agoraphobia: Secondary | ICD-10-CM

## 2022-06-09 DIAGNOSIS — R300 Dysuria: Secondary | ICD-10-CM

## 2022-06-09 LAB — URINALYSIS, COMPLETE
Bilirubin, UA: NEGATIVE
Glucose, UA: NEGATIVE
Ketones, UA: NEGATIVE
Leukocytes,UA: NEGATIVE
Nitrite, UA: NEGATIVE
Protein,UA: NEGATIVE
RBC, UA: NEGATIVE
Specific Gravity, UA: 1.015 (ref 1.005–1.030)
Urobilinogen, Ur: 0.2 mg/dL (ref 0.2–1.0)
pH, UA: 6 (ref 5.0–7.5)

## 2022-06-09 LAB — MICROSCOPIC EXAMINATION
RBC, Urine: NONE SEEN /hpf (ref 0–2)
Renal Epithel, UA: NONE SEEN /hpf

## 2022-06-09 NOTE — Patient Instructions (Signed)

## 2022-06-09 NOTE — Progress Notes (Signed)
Subjective:    Patient ID: Brenda Cruz, female    DOB: May 13, 1966, 56 y.o.   MRN: [id number]   Chief Complaint: Chest Pain    HPI:  Brenda Cruz is a 56 y.o. who identifies as a female who was assigned female at birth.   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 The current episode started yesterday. The onset quality is undetermined. The problem occurs intermittently. The problem has been rapidly improving. The pain is present in the substernal region. The pain is at a severity of 7/10. The pain is moderate. The quality of the pain is described as squeezing. The pain does not radiate. Pertinent negatives include no abdominal pain, cough, diaphoresis, dizziness, headaches, near-syncope, orthopnea, palpitations, shortness of breath or weakness. The pain is aggravated by nothing. She has tried nothing for the symptoms. Risk factors include smoking/tobacco exposure and lack of exercise.  Her past medical history is significant for CAD, hyperlipidemia and hypertension.  Urinary Tract Infection  This is a new problem. The current episode started in the past 7 days. The problem occurs intermittently. The problem has been waxing and waning. The quality of the pain is described as burning. The pain is at a severity of 5/10. The pain is moderate. There has been no fever. She is Sexually active. There is No history of pyelonephritis. Associated symptoms include frequency and urgency. Pertinent negatives include no discharge or flank pain. She has tried nothing for the symptoms. The treatment provided no relief.      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   Sulfa Antibiotics Itching, Rash and Other (See Comments)   Outpatient Encounter Medications as of 06/09/2022  Medication Sig   acetaminophen (TYLENOL) 500 MG tablet Take 500 mg by mouth every 6 (six) hours as needed for mild pain.   albuterol (VENTOLIN HFA) 108 (90 Base) MCG/ACT inhaler Inhale 2 puffs into the lungs every 4 (four) hours as needed for wheezing or shortness of breath.   aspirin EC 81 MG tablet Take 81 mg by mouth every evening.    blood glucose meter kit and supplies Dispense based on patient and insurance preference. Use up to four times daily as directed. (FOR ICD-10 E10.9, E11.9).   buPROPion (WELLBUTRIN XL) 150 MG 24 hr tablet Take 1 tablet (150 mg total) by mouth daily.   cyclobenzaprine (FLEXERIL) 10 MG tablet Take 1 tablet (10 mg total) by mouth 3 (three) times daily as  needed for muscle spasms.   diphenhydrAMINE (BENADRYL) 25 MG tablet Take 25 mg by mouth every 6 (six) hours as needed for itching or allergies.   esomeprazole (NEXIUM) 40 MG capsule Take 1 capsule (40 mg total) by mouth daily.   FLUoxetine (PROZAC) 40 MG capsule Take 2 capsules (80 mg total) by mouth daily. TAKE ONE (1) CAPSULE EACH DAY   HYDROcodone-acetaminophen (NORCO/VICODIN) 5-325 MG tablet Take 1 tablet by mouth every 6 (six) hours as needed for  moderate pain.   ipratropium-albuterol (DUONEB) 0.5-2.5 (3) MG/3ML SOLN Take 3 mLs by nebulization every 6 (six) hours as needed (Foir shortness of breath).   LORazepam (ATIVAN) 0.5 MG tablet Take 1 tablet (0.5 mg total) by mouth every 8 (eight) hours as needed for anxiety.   magnesium oxide (MAG-OX) 400 MG tablet Take 1 tablet (400 mg total) by mouth daily.   metFORMIN (GLUCOPHAGE) 1000 MG tablet Take 1 tablet (1,000 mg total) by mouth 2 (two) times daily.   metoprolol tartrate (LOPRESSOR) 50 MG tablet Take 1 tablet (50 mg total) by mouth 2 (two) times daily.   Multiple Vitamin (MULTIVITAMIN WITH MINERALS) TABS tablet Take 1 tablet by mouth daily.   nitroGLYCERIN (NITROSTAT) 0.4 MG SL tablet Place 1 tablet (0.4 mg total) under the tongue every 5 (five) minutes as needed for chest pain.   ondansetron (ZOFRAN ODT) 8 MG disintegrating tablet Take 1 tablet (8 mg total) by mouth every 8 (eight) hours as needed for nausea or vomiting.   Probiotic Product (RA PROBIOTIC GUMMIES) CHEW Chew 1 each by mouth at bedtime.   No facility-administered encounter medications on file as of 06/09/2022.    Past Surgical History:  Procedure Laterality Date   CARDIAC CATHETERIZATION N/A 10/29/2015   Procedure: Left Heart Cath and Coronary Angiography;  Surgeon: [name], MD;  Location: [facility];  Service: Cardiovascular;  Laterality: N/A;   CHOLECYSTECTOMY     CORONARY ARTERY BYPASS GRAFT N/A 11/02/2015   Procedure: CORONARY ARTERY BYPASS GRAFTING (CABG)x 1 using left internal mammary artery.;  Surgeon: Melrose Nakayama, MD;  Location: [facility];  Service: Open Heart Surgery;  Laterality: N/A;   LEFT OOPHORECTOMY Left    Related to tubal torsion.  Bilateral salpingectomy also.  Remaining uterus and right ovary   LUMBAR FUSION     TEE WITHOUT CARDIOVERSION N/A 11/02/2015   Procedure: TRANSESOPHAGEAL ECHOCARDIOGRAM (TEE);  Surgeon: Melrose Nakayama, MD;  Location: [facility];  Service: Open Heart  Surgery;  Laterality: N/A;    Family History  Problem Relation Age of Onset   Colon polyps Father    Stomach cancer Paternal Grandmother    Esophageal cancer Maternal Grandfather    Breast cancer Mother    Ovarian cancer Other        Maternal Side Great  Aunt   Colon cancer Neg Hx       Controlled substance contract: 04/26/21     Review of Systems  Constitutional:  Negative for diaphoresis.  Eyes:  Negative for pain.  Respiratory:  Negative for cough and shortness of breath.   Cardiovascular:  Positive for chest pain. Negative for palpitations, orthopnea, leg swelling and near-syncope.  Gastrointestinal:  Negative for abdominal pain.  Endocrine: Negative for polydipsia.  Genitourinary:  Positive for frequency and urgency. Negative for flank pain.  Skin:  Negative for rash.  Neurological:  Negative for dizziness, weakness and headaches.  Hematological:  Does not bruise/bleed easily.  All other systems reviewed and are negative.  Objective:   Physical Exam Vitals and nursing note reviewed.  Constitutional:      General: She is not in acute distress.    Appearance: Normal appearance. She is well-developed.  Neck:     Vascular: No carotid bruit or JVD.  Cardiovascular:     Rate and Rhythm: Normal rate and regular rhythm.     Heart sounds: Normal heart sounds.  Pulmonary:     Effort: Pulmonary effort is normal. No respiratory distress.     Breath sounds: Normal breath sounds. No wheezing or rales.  Chest:     Chest wall: No tenderness.  Abdominal:     General: Bowel sounds are normal. There is no distension or abdominal bruit.     Palpations: Abdomen is soft. There is no hepatomegaly, splenomegaly, mass or pulsatile mass.     Tenderness: There is no abdominal tenderness.  Musculoskeletal:        General: Normal range of motion.     Cervical back: Normal range of motion and neck supple.  Lymphadenopathy:     Cervical: No cervical adenopathy.  Skin:     General: Skin is warm and dry.  Neurological:     Mental Status: She is alert and oriented to person, place, and time.     Deep Tendon Reflexes: Reflexes are normal and symmetric.  Psychiatric:        Behavior: Behavior normal.        Thought Content: Thought content normal.        Judgment: Judgment normal.       BP (!) 170/82   Pulse 62   Temp (!) 97.5 F (36.4 C) (Temporal)   Resp 20   Ht 5' 8"  (1.727 m)   Wt 206 lb (93.4 kg)   LMP 06/03/2017 (Exact Date)   SpO2 100%   BMI 31.32 kg/m   U/A clear    Assessment & Plan:   Brenda Cruz in today with chief complaint of Chest Pain and Dysuria   1. Dysuria Urine clear - Urinalysis, Complete  2. Chest tightness Ekg NSR - EKG 12-Lead  3. Panic attack as reaction to stress Stress management    The above assessment and management plan was discussed with the patient. The patient verbalized understanding of and has agreed to the management plan. Patient is aware to call the clinic if symptoms persist or worsen. Patient is aware when to return to the clinic for a follow-up visit. Patient educated on when it is appropriate to go to the emergency department.   [name] [name], [name]

## 2022-06-17 ENCOUNTER — Other Ambulatory Visit: Payer: Self-pay | Admitting: Nurse Practitioner

## 2022-06-17 DIAGNOSIS — J411 Mucopurulent chronic bronchitis: Secondary | ICD-10-CM

## 2022-07-15 ENCOUNTER — Encounter: Payer: Self-pay | Admitting: Nurse Practitioner

## 2022-07-15 ENCOUNTER — Ambulatory Visit: Payer: Managed Care, Other (non HMO) | Admitting: Nurse Practitioner

## 2022-07-15 VITALS — BP 149/86 | Temp 97.6°F | Ht 68.0 in | Wt 209.6 lb

## 2022-07-15 DIAGNOSIS — F321 Major depressive disorder, single episode, moderate: Secondary | ICD-10-CM

## 2022-07-15 DIAGNOSIS — Z23 Encounter for immunization: Secondary | ICD-10-CM | POA: Diagnosis not present

## 2022-07-15 DIAGNOSIS — I83813 Varicose veins of bilateral lower extremities with pain: Secondary | ICD-10-CM

## 2022-07-15 DIAGNOSIS — I10 Essential (primary) hypertension: Secondary | ICD-10-CM | POA: Diagnosis not present

## 2022-07-15 MED ORDER — AMLODIPINE BESYLATE 5 MG PO TABS: 5.0000 mg | ORAL_TABLET | Freq: Every day | ORAL | 1 refills | Status: DC

## 2022-07-15 MED ORDER — QUETIAPINE FUMARATE 25 MG PO TABS: 25.0000 mg | ORAL_TABLET | Freq: Every day | ORAL | 2 refills | Status: DC

## 2022-07-15 NOTE — Patient Instructions (Signed)

## 2022-07-15 NOTE — Progress Notes (Signed)
Subjective:    Patient ID: Brenda Cruz, female    DOB: [birth year], 56 y.o.   MRN: [id number]   Chief Complaint: Medication Reaction (Joint pain from wellbutrin ) and Leg Pain (Bilateral leg pain at night that has been ongoing )   HPI Patient come sin to discuss medications. Patient was seen back on June 07, 2023 after her mom passed away and was not doing well. She was on prozac at the time and we added wellbutrin. She says that the wellbutrin is causing her joint pain and she wants to try something else. She stopped wellbutrin and her joint pain went away. She says she is also having leg pain at night which has been on going. She has varicose veins and she thinks that is what is hurting. She has tried gabapentin which did not help.    Review of Systems  Constitutional:  Negative for diaphoresis.  Eyes:  Negative for pain.  Respiratory:  Negative for shortness of breath.   Cardiovascular:  Negative for chest pain, palpitations and leg swelling.  Gastrointestinal:  Negative for abdominal pain.  Endocrine: Negative for polydipsia.  Skin:  Negative for rash.  Neurological:  Negative for dizziness, weakness and headaches.  Hematological:  Does not bruise/bleed easily.  All other systems reviewed and are negative.      Objective:   Physical Exam Constitutional:      Appearance: Normal appearance.  Cardiovascular:     Rate and Rhythm: Normal rate and regular rhythm.     Heart sounds: Normal heart sounds.     Comments: Varicose veins bil lower et.  Pulmonary:     Effort: Pulmonary effort is normal.     Breath sounds: Normal breath sounds.  Skin:    General: Skin is warm.  Neurological:     General: No focal deficit present.     Mental Status: She is alert and oriented to person, place, and time.  Psychiatric:        Mood and Affect: Mood normal.        Behavior: Behavior normal.     BP (!) 149/86   Temp 97.6 F (36.4 C) (Temporal)   Ht '5\' 8"'$  (1.727 m)   Wt 209 lb 9.6 oz  (95.1 kg)   LMP 06/03/2017 (Exact Date)   SpO2 99%   BMI 31.87 kg/m        Assessment & Plan:   Brenda Cruz in today with chief complaint of Medication Reaction (Joint pain from wellbutrin ) and Leg Pain (Bilateral leg pain at night that has been ongoing )   1. Depression, major, single episode, moderate (HCC) Added seroquel- side effects discussed - QUEtiapine (SEROQUEL) 25 MG tablet; Take 1 tablet (25 mg total) by mouth at bedtime.  Dispense: 30 tablet; Refill: 2  2. Essential hypertension Low sodium diet Added amlodipine back - amLODipine (NORVASC) 5 MG tablet; Take 1 tablet (5 mg total) by mouth daily.  Dispense: 90 tablet; Refill: 1  3. Varicose veins of both lower extremities with pain Will make appointment with vein specialist  4. Need for immunization against influenza  - Flu Vaccine QUAD 5moIM (Fluarix, Fluzone & Alfiuria Quad PF)    The above assessment and management plan was discussed with the patient. The patient verbalized understanding of and has agreed to the management plan. Patient is aware to call the clinic if symptoms persist or worsen. Patient is aware when to return to the clinic for a follow-up visit. Patient educated on when  it is appropriate to go to the emergency department.   [name] [name], [name]

## 2022-07-18 ENCOUNTER — Telehealth: Payer: Self-pay | Admitting: Nurse Practitioner

## 2022-07-18 NOTE — Telephone Encounter (Signed)
Patient wants to talk to nurse about QUEtiapine (SEROQUEL) 25 MG tablet. She is not taking it anymore because she feels like it made her have a nervious breakdown over the weekend. Please call back.

## 2022-07-18 NOTE — Telephone Encounter (Signed)
Last dose of Seroquel last pm. Wakes up with chest pain, fast heart rate, vivid dreams, very angry. States that she is at work and want to "cut people up"  Should she start back on Wellbutrin '150mg'$ ? She feels that she was not depressed, agitated on this medication.  Pt does not want to come back in the office and pay another copay.

## 2022-07-18 NOTE — Telephone Encounter (Signed)
Pt has been informed. She will d/c Seroquel and start back on Wellbutrin and Prozac.

## 2022-07-18 NOTE — Telephone Encounter (Signed)
Stop seroquel and go back o n wellbutrin

## 2022-08-08 ENCOUNTER — Ambulatory Visit: Payer: Managed Care, Other (non HMO) | Admitting: Nurse Practitioner

## 2022-08-27 ENCOUNTER — Other Ambulatory Visit: Payer: Self-pay | Admitting: Nurse Practitioner

## 2022-08-27 DIAGNOSIS — F411 Generalized anxiety disorder: Secondary | ICD-10-CM

## 2022-08-29 ENCOUNTER — Encounter: Payer: Self-pay | Admitting: Nurse Practitioner

## 2022-08-29 ENCOUNTER — Ambulatory Visit: Payer: Managed Care, Other (non HMO) | Admitting: Nurse Practitioner

## 2022-08-29 VITALS — BP 145/83 | HR 70 | Temp 97.7°F | Resp 20 | Ht 68.0 in | Wt 209.0 lb

## 2022-08-29 DIAGNOSIS — F3341 Major depressive disorder, recurrent, in partial remission: Secondary | ICD-10-CM

## 2022-08-29 DIAGNOSIS — K219 Gastro-esophageal reflux disease without esophagitis: Secondary | ICD-10-CM

## 2022-08-29 DIAGNOSIS — E782 Mixed hyperlipidemia: Secondary | ICD-10-CM | POA: Diagnosis not present

## 2022-08-29 DIAGNOSIS — I10 Essential (primary) hypertension: Secondary | ICD-10-CM

## 2022-08-29 DIAGNOSIS — J411 Mucopurulent chronic bronchitis: Secondary | ICD-10-CM

## 2022-08-29 DIAGNOSIS — E1165 Type 2 diabetes mellitus with hyperglycemia: Secondary | ICD-10-CM

## 2022-08-29 DIAGNOSIS — D508 Other iron deficiency anemias: Secondary | ICD-10-CM

## 2022-08-29 DIAGNOSIS — Z720 Tobacco use: Secondary | ICD-10-CM

## 2022-08-29 DIAGNOSIS — N393 Stress incontinence (female) (male): Secondary | ICD-10-CM

## 2022-08-29 DIAGNOSIS — F411 Generalized anxiety disorder: Secondary | ICD-10-CM

## 2022-08-29 DIAGNOSIS — I25119 Atherosclerotic heart disease of native coronary artery with unspecified angina pectoris: Secondary | ICD-10-CM

## 2022-08-29 DIAGNOSIS — E669 Obesity, unspecified: Secondary | ICD-10-CM

## 2022-08-29 LAB — BAYER DCA HB A1C WAIVED: HB A1C (BAYER DCA - WAIVED): 7.9 % — ABNORMAL HIGH (ref 4.8–5.6)

## 2022-08-29 MED ORDER — AMLODIPINE BESYLATE 5 MG PO TABS: 5.0000 mg | ORAL_TABLET | Freq: Every day | ORAL | 1 refills | Status: DC

## 2022-08-29 MED ORDER — ESOMEPRAZOLE MAGNESIUM 40 MG PO CPDR: 40.0000 mg | DELAYED_RELEASE_CAPSULE | Freq: Every day | ORAL | 1 refills | Status: DC

## 2022-08-29 MED ORDER — LORAZEPAM 0.5 MG PO TABS: 0.5000 mg | ORAL_TABLET | Freq: Three times a day (TID) | ORAL | 2 refills | Status: DC | PRN

## 2022-08-29 MED ORDER — FLUOXETINE HCL 40 MG PO CAPS: 80.0000 mg | ORAL_CAPSULE | Freq: Every day | ORAL | 1 refills | Status: DC

## 2022-08-29 MED ORDER — METFORMIN HCL 1000 MG PO TABS: 1000.0000 mg | ORAL_TABLET | Freq: Two times a day (BID) | ORAL | 1 refills | Status: DC

## 2022-08-29 MED ORDER — METOPROLOL TARTRATE 50 MG PO TABS: 50.0000 mg | ORAL_TABLET | Freq: Two times a day (BID) | ORAL | 1 refills | Status: DC

## 2022-08-29 MED ORDER — MAGNESIUM OXIDE 400 MG PO TABS: 400.0000 mg | ORAL_TABLET | Freq: Every day | ORAL | 1 refills | Status: DC

## 2022-08-29 NOTE — Progress Notes (Signed)
Subjective:    Patient ID: Brenda Cruz, female    DOB: 1965-12-01, 56 y.o.   MRN: [id number]   Chief Complaint: Medical Management of Chronic Issues    HPI:  Brenda Cruz is a 56 y.o. who identifies as a female who was assigned female at birth.   Social history: Lives with: husband and son Work history: works at [employer] in today for follow up of the following chronic medical issues:  1. Essential hypertension No chest pain; has occasional sob. Does check BP at home; when she takes her amlodipine it usually runs 130s/80s; when she doesn't take it SBP runs 170s BP Readings from Last 3 Encounters:  08/29/22 (!) 145/83  07/15/22 (!) 149/86  06/09/22 (!) 170/82    2. Type 2 diabetes mellitus with hyperglycemia, without long-term current use of insulin ([place]) Does not watch her diet; does not check blood sugars at home.   Lab Results  Component Value Date   HGBA1C 7.2 (H) 05/06/2022     3. Mixed hyperlipidemia Does not watch diet; does not exercise regularly. Has not taken Crestor for 2 years; would like to discuss this today. Lab Results  Component Value Date   CHOL 278 (H) 05/06/2022   HDL 27 (L) 05/06/2022   LDLCALC 182 (H) 05/06/2022   TRIG 349 (H) 05/06/2022   CHOLHDL 10.3 (H) 05/06/2022   The 10-year ASCVD risk score (Arnett DK, et al., 2019) is: 37%   4. Mucopurulent chronic bronchitis ([place]) Doing ok; has occasional sob which she feels is related to when she is stressed.  5. Gastroesophageal reflux disease without esophagitis Doing well on Nexium  6. Recurrent major depressive disorder, in partial remission ([place]) Doing ok on Prozac.    08/29/2022    9:39 AM 07/15/2022   11:41 AM 06/09/2022    8:03 AM 05/19/2022   12:10 PM 05/06/2022    9:10 AM  Depression screen PHQ 2/9  Decreased Interest _0 Down, Depressed, Hopeless _1 PHQ - 2 Score _2 Altered sleeping _3 Tired, decreased energy _4 Change  in appetite 0 0 0 1 1  Feeling bad or failure about yourself  0 _5 Trouble concentrating 0 _6 Moving slowly or fidgety/restless 0 _7 Suicidal thoughts 0 0 0 0 0  PHQ-9 Score _8 Difficult doing work/chores Not difficult at all Very difficult Extremely dIfficult Extremely dIfficult Extremely dIfficult    7. GAD (generalized anxiety disorder) Takes ativan 22m TID every day.    08/29/2022    9:39 AM 07/15/2022   11:42 AM 06/09/2022    8:04 AM 05/19/2022   12:11 PM  GAD 7 : Generalized Anxiety Score  Nervous, Anxious, on Edge _9 Control/stop worrying _10 Worry too much - different things _11 Trouble relaxing _12 Restless _13 Easily annoyed or irritable _14 Afraid - awful might happen _15 Total GAD 7 Score _16 Anxiety Difficulty Somewhat difficult Very difficult Extremely difficult Extremely difficult     8. Iron deficiency anemia secondary to inadequate dietary iron intake Fatigue and has dizziness at times, has  been "really stressed" since her mom passed. Lab Results  Component Value Date   HGB 12.6 05/06/2022    9. Stress incontinence of urine No recent issues  10. Tobacco abuse Still smokes a pack/day.  11. Obesity with body mass index of 30.0-39.9 No recent weight changes. Wt Readings from Last 3 Encounters:  08/29/22 209 lb (94.8 kg)  07/15/22 209 lb 9.6 oz (95.1 kg)  06/09/22 206 lb (93.4 kg)   BMI Readings from Last 3 Encounters:  08/29/22 31.78 kg/m  07/15/22 31.87 kg/m  06/09/22 31.32 kg/m     New complaints: Has found a place on her L breast; has an appointment for mammogram and ultrasound scheduled for this Thursday.  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   Sulfa Antibiotics Itching, Rash and Other (See Comments)   Outpatient Encounter Medications as of 08/29/2022  Medication Sig   acetaminophen (TYLENOL) 500 MG tablet Take 500 mg by mouth every 6 (six) hours as needed for mild pain.   albuterol (VENTOLIN HFA) 108 (90 Base) MCG/ACT inhaler Inhale 2 puffs into the lungs every 4 (four) hours as needed for wheezing or shortness of breath.   amLODipine (NORVASC) 5 MG tablet Take 1 tablet (5 mg total) by mouth daily.   aspirin EC 81 MG tablet Take 81 mg by mouth every evening.    blood glucose meter kit and supplies Dispense based on patient and insurance preference. Use up to four times daily as directed. (FOR ICD-10 E10.9, E11.9).   diphenhydrAMINE (BENADRYL) 25 MG tablet Take 25 mg by mouth every 6 (six) hours as needed for itching or allergies.   esomeprazole (NEXIUM) 40 MG capsule Take 1 capsule (40 mg total) by mouth daily.   FLUoxetine (PROZAC) 40 MG capsule Take 2 capsules (80 mg total) by mouth daily. TAKE ONE (1) CAPSULE EACH DAY (Patient taking differently: Take 40 mg by mouth daily. TAKE ONE (1) CAPSULE EACH DAY)   ipratropium-albuterol (DUONEB) 0.5-2.5 (3) MG/3ML SOLN Take 3 mLs by nebulization every 6 (six) hours as needed ([place] shortness of breath).   LORazepam (ATIVAN)  0.5 MG tablet Take 1 tablet (0.5 mg total) by mouth every 8 (eight) hours as needed for anxiety.   magnesium oxide (MAG-OX) 400 MG tablet Take 1 tablet (400 mg total) by mouth daily.   metFORMIN (GLUCOPHAGE) 1000 MG tablet Take 1 tablet (1,000 mg total) by mouth 2 (two) times daily.   metoprolol tartrate (LOPRESSOR) 50 MG tablet Take 1 tablet (50 mg total) by mouth 2 (two) times daily.   Multiple Vitamin (MULTIVITAMIN  WITH MINERALS) TABS tablet Take 1 tablet by mouth daily.   nitroGLYCERIN (NITROSTAT) 0.4 MG SL tablet Place 1 tablet (0.4 mg total) under the tongue every 5 (five) minutes as needed for chest pain.   ondansetron (ZOFRAN-ODT) 8 MG disintegrating tablet TAKE 1 TABLET BY MOUTH EVERY 8 HOURS AS NEEDED FOR NAUSEA & VOMITING   cyclobenzaprine (FLEXERIL) 10 MG tablet Take 1 tablet (10 mg total) by mouth 3 (three) times daily as needed for muscle spasms. (Patient not taking: Reported on 08/29/2022)   HYDROcodone-acetaminophen (NORCO/VICODIN) 5-325 MG tablet Take 1 tablet by mouth every 6 (six) hours as needed for moderate pain. (Patient not taking: Reported on 08/29/2022)   Probiotic Product (RA PROBIOTIC GUMMIES) CHEW Chew 1 each by mouth at bedtime. (Patient not taking: Reported on 08/29/2022)   No facility-administered encounter medications on file as of 08/29/2022.    Past Surgical History:  Procedure Laterality Date   CARDIAC CATHETERIZATION N/A 10/29/2015   Procedure: Left Heart Cath and Coronary Angiography;  Surgeon: [name], MD;  Location: [facility];  Service: Cardiovascular;  Laterality: N/A;   CHOLECYSTECTOMY     CORONARY ARTERY BYPASS GRAFT N/A 11/02/2015   Procedure: CORONARY ARTERY BYPASS GRAFTING (CABG)x 1 using left internal mammary artery.;  Surgeon: Melrose Nakayama, MD;  Location: [facility];  Service: Open Heart Surgery;  Laterality: N/A;   LEFT OOPHORECTOMY Left    Related to tubal torsion.  Bilateral salpingectomy also.  Remaining uterus and right  ovary   LUMBAR FUSION     TEE WITHOUT CARDIOVERSION N/A 11/02/2015   Procedure: TRANSESOPHAGEAL ECHOCARDIOGRAM (TEE);  Surgeon: Melrose Nakayama, MD;  Location: [facility];  Service: Open Heart Surgery;  Laterality: N/A;    Family History  Problem Relation Age of Onset   Colon polyps Father    Stomach cancer Paternal Grandmother    Esophageal cancer Maternal Grandfather    Breast cancer Mother    Ovarian cancer Other        Maternal Side Great  Aunt   Colon cancer Neg Hx       Controlled substance contract: 08/29/22     Review of Systems  Constitutional:  Positive for fatigue. Negative for appetite change.  HENT:  Negative for ear pain.   Eyes:  Negative for pain.  Respiratory:  Positive for shortness of breath. Negative for chest tightness.        Occasional sob  Cardiovascular:  Negative for chest pain, palpitations and leg swelling.  Gastrointestinal:  Negative for abdominal pain.  Endocrine: Negative for polydipsia and polyphagia.  Genitourinary:  Negative for difficulty urinating.  Musculoskeletal:  Negative for arthralgias.  Neurological:  Positive for dizziness. Negative for weakness and light-headedness.       Occasional dizziness; states "probably when my sugar is high"  Hematological:  Negative for adenopathy. Does not bruise/bleed easily.  All other systems reviewed and are negative.      Objective:   Physical Exam Vitals and nursing note reviewed.  Constitutional:      General: She is not in acute distress.    Appearance: Normal appearance. She is not ill-appearing.  HENT:     Head: Normocephalic and atraumatic.     Right Ear: Tympanic membrane, ear canal and external ear normal.     Left Ear: Tympanic membrane, ear canal and external ear normal.     Nose: Nose normal.     Mouth/Throat:     Mouth: Mucous membranes are moist.  Pharynx: Oropharynx is clear.  Eyes:     Conjunctiva/sclera: Conjunctivae normal.     Pupils: Pupils are equal, round, and  reactive to light.  Neck:     Vascular: No carotid bruit.  Cardiovascular:     Rate and Rhythm: Normal rate and regular rhythm.     Pulses: Normal pulses.     Heart sounds: Normal heart sounds.  Pulmonary:     Effort: Pulmonary effort is normal. No respiratory distress.     Breath sounds: Normal breath sounds. No wheezing, rhonchi or rales.  Abdominal:     General: Abdomen is flat. Bowel sounds are normal.     Palpations: Abdomen is soft.  Musculoskeletal:        General: Normal range of motion.     Cervical back: Normal range of motion. No tenderness.     Right lower leg: No edema.     Left lower leg: No edema.  Lymphadenopathy:     Cervical: No cervical adenopathy.  Skin:    General: Skin is warm and dry.     Capillary Refill: Capillary refill takes less than 2 seconds.  Neurological:     General: No focal deficit present.     Mental Status: She is alert and oriented to person, place, and time.  Psychiatric:        Mood and Affect: Mood normal.        Behavior: Behavior normal.    HbA1c 7.9%  BP (!) 145/83   Pulse 70   Temp 97.7 F (36.5 C) (Oral)   Resp 20   Ht _0  (1.727 m)   Wt 209 lb (94.8 kg)   LMP 06/03/2017 (Exact Date)   SpO2 100%   BMI 31.78 kg/m       Assessment & Plan:   Brenda Cruz comes in today with chief complaint of Medical Management of Chronic Issues   Diagnosis and orders addressed:  1. Essential hypertension Low sodium diet. Stress management. - CBC with Differential/Platelet - CMP14+EGFR - amLODipine (NORVASC) 5 MG tablet; Take 1 tablet (5 mg total) by mouth daily.  Dispense: 90 tablet; Refill: 1 - metoprolol tartrate (LOPRESSOR) 50 MG tablet; Take 1 tablet (50 mg total) by mouth 2 (two) times daily.  Dispense: 180 tablet; Refill: 1  2. Type 2 diabetes mellitus with hyperglycemia, without long-term current use of insulin ([facility]) Watch carb intake. - Microalbumin / creatinine urine ratio - Bayer DCA Hb A1c Waived - metFORMIN  (GLUCOPHAGE) 1000 MG tablet; Take 1 tablet (1,000 mg total) by mouth 2 (two) times daily.  Dispense: 180 tablet; Refill: 1  3. Mixed hyperlipidemia Low fat diet. Will discuss with clinical pharmacist possibly starting [place] since pt had so many problems with muscle cramps on Crestor. - Lipase - Lipid panel  4. Mucopurulent chronic bronchitis ([place])   5. Gastroesophageal reflux disease without esophagitis Avoid spicy foods. Avoid eating 2 hours before bed. - esomeprazole (NEXIUM) 40 MG capsule; Take 1 capsule (40 mg total) by mouth daily.  Dispense: 90 capsule; Refill: 1  6. Recurrent major depressive disorder, in partial remission (HCC) - FLUoxetine (PROZAC) 40 MG capsule; Take 2 capsules (80 mg total) by mouth daily. TAKE ONE (1) CAPSULE EACH DAY  Dispense: 180 capsule; Refill: 1 7. GAD (generalized anxiety disorder) Stress management. - LORazepam (ATIVAN) 0.5 MG tablet; Take 1 tablet (0.5 mg total) by mouth every 8 (eight) hours as needed for anxiety.  Dispense: 90 tablet; Refill: 2  8. Iron deficiency anemia  secondary to inadequate dietary iron intake Labs pending  9. Stress incontinence of urine   10. Tobacco abuse Encouraged smoking cessation.  11. Obesity with body mass index of 30.0-39.9 Discussed diet and exercise for person with BMI > 25. Will recheck weight in 3-6 months  12. Coronary artery disease involving native coronary artery of native heart with angina pectoris [facility]) Follow-up with cardiology - magnesium oxide (MAG-OX) 400 MG tablet; Take 1 tablet (400 mg total) by mouth daily.  Dispense: 90 tablet; Refill: 1   Labs pending Health Maintenance reviewed Diet and exercise encouraged  Follow up plan: 3 months  Collene Leyden, [name] student  [name], [place]

## 2022-08-30 LAB — CBC WITH DIFFERENTIAL/PLATELET
Basophils Absolute: 0.1 10*3/uL (ref 0.0–0.2)
Basos: 1 %
EOS (ABSOLUTE): 0.2 10*3/uL (ref 0.0–0.4)
Eos: 2 %
Hematocrit: 40.6 % (ref 34.0–46.6)
Hemoglobin: 13.6 g/dL (ref 11.1–15.9)
Immature Grans (Abs): 0.1 10*3/uL (ref 0.0–0.1)
Immature Granulocytes: 1 %
Lymphocytes Absolute: 2.2 10*3/uL (ref 0.7–3.1)
Lymphs: 25 %
MCH: 30.3 pg (ref 26.6–33.0)
MCHC: 33.5 g/dL (ref 31.5–35.7)
MCV: 90 fL (ref 79–97)
Monocytes Absolute: 0.7 10*3/uL (ref 0.1–0.9)
Monocytes: 8 %
Neutrophils Absolute: 5.8 10*3/uL (ref 1.4–7.0)
Neutrophils: 63 %
Platelets: 272 10*3/uL (ref 150–450)
RBC: 4.49 x10E6/uL (ref 3.77–5.28)
RDW: 12.3 % (ref 11.7–15.4)
WBC: 9 10*3/uL (ref 3.4–10.8)

## 2022-08-30 LAB — CMP14+EGFR
ALT: 37 IU/L — ABNORMAL HIGH (ref 0–32)
AST: 41 IU/L — ABNORMAL HIGH (ref 0–40)
Albumin/Globulin Ratio: 1.5 (ref 1.2–2.2)
Albumin: 4.4 g/dL (ref 3.8–4.9)
Alkaline Phosphatase: 89 IU/L (ref 44–121)
BUN/Creatinine Ratio: 11 (ref 9–23)
BUN: 12 mg/dL (ref 6–24)
Bilirubin Total: 0.3 mg/dL (ref 0.0–1.2)
CO2: 24 mmol/L (ref 20–29)
Calcium: 9.5 mg/dL (ref 8.7–10.2)
Chloride: 98 mmol/L (ref 96–106)
Creatinine, Ser: 1.07 mg/dL — ABNORMAL HIGH (ref 0.57–1.00)
Globulin, Total: 3 g/dL (ref 1.5–4.5)
Glucose: 224 mg/dL — ABNORMAL HIGH (ref 70–99)
Potassium: 4.5 mmol/L (ref 3.5–5.2)
Sodium: 138 mmol/L (ref 134–144)
Total Protein: 7.4 g/dL (ref 6.0–8.5)
eGFR: 62 mL/min/{1.73_m2} (ref >59)

## 2022-08-30 LAB — MICROALBUMIN / CREATININE URINE RATIO
Creatinine, Urine: 89.6 mg/dL
Microalb/Creat Ratio: 39 mg/g creat — ABNORMAL HIGH (ref 0–29)
Microalbumin, Urine: 34.8 ug/mL

## 2022-08-31 LAB — LIPID PANEL
Chol/HDL Ratio: 10.7 ratio — ABNORMAL HIGH (ref 0.0–4.4)
Cholesterol, Total: 322 mg/dL — ABNORMAL HIGH (ref 100–199)
HDL: 30 mg/dL — ABNORMAL LOW (ref >39)
LDL Chol Calc (NIH): 199 mg/dL — ABNORMAL HIGH (ref 0–99)
Triglycerides: 446 mg/dL — ABNORMAL HIGH (ref 0–149)
VLDL Cholesterol Cal: 93 mg/dL — ABNORMAL HIGH (ref 5–40)

## 2022-08-31 LAB — SPECIMEN STATUS REPORT

## 2022-09-23 ENCOUNTER — Ambulatory Visit: Payer: Managed Care, Other (non HMO) | Admitting: Nurse Practitioner

## 2022-09-23 ENCOUNTER — Encounter: Payer: Self-pay | Admitting: Nurse Practitioner

## 2022-09-23 VITALS — BP 144/76 | HR 76 | Temp 96.8°F | Ht 68.0 in | Wt 210.0 lb

## 2022-09-23 DIAGNOSIS — K591 Functional diarrhea: Secondary | ICD-10-CM | POA: Diagnosis not present

## 2022-09-23 DIAGNOSIS — B029 Zoster without complications: Secondary | ICD-10-CM | POA: Diagnosis not present

## 2022-09-23 MED ORDER — VALACYCLOVIR HCL 1 G PO TABS: 1000.0000 mg | ORAL_TABLET | Freq: Three times a day (TID) | ORAL | 0 refills | Status: AC

## 2022-09-23 NOTE — Patient Instructions (Signed)
Diarrhea, Adult Diarrhea is frequent loose and sometimes watery bowel movements. Diarrhea can make you feel weak and cause you to become dehydrated. Dehydration is a condition in which there is not enough water or other fluids in the body. Dehydration can make you tired and thirsty, cause you to have a dry mouth, and decrease how often you urinate. Diarrhea typically lasts 2-3 days. However, it can last longer if it is a sign of something more serious. It is important to treat your diarrhea as told by your health care provider. Follow these instructions at home: Eating and drinking     Follow these recommendations as told by your health care provider: Take an oral rehydration solution (ORS). This is an over-the-counter medicine that helps return your body to its normal balance of nutrients and water. It is found at pharmacies and retail stores. Drink enough fluid to keep your urine pale yellow. Drink fluids such as water, diluted fruit juice, and low-calorie sports drinks. You can drink milk also, if desired. Sucking on ice chips is another way to get fluids. Avoid drinking fluids that contain a lot of sugar or caffeine, such as soda, energy drinks, and regular sports drinks. Avoid alcohol. Eat bland, easy-to-digest foods in small amounts as you are able. These foods include bananas, applesauce, rice, lean meats, toast, and crackers. Avoid spicy or fatty foods.  Medicines Take over-the-counter and prescription medicines only as told by your health care provider. If you were prescribed antibiotics, take them as told by your health care provider. Do not stop using the antibiotic even if you start to feel better. General instructions  Wash your hands often using soap and water for at least 20 seconds. If soap and water are not available, use hand sanitizer. Others in the household should wash their hands as well. Hands should be washed: After using the toilet or changing a diaper. Before  preparing, cooking, or serving food. While caring for a sick person or while visiting someone in a hospital. Rest at home while you recover. Take a warm bath to relieve any burning or pain from frequent diarrhea episodes. Watch your condition for any changes. Contact a health care provider if: You have a fever. Your diarrhea gets worse. You have new symptoms. You vomit every time you eat or drink. You feel light-headed, dizzy, or have a headache. You have muscle cramps. You have signs of dehydration, such as: Dark urine, very little urine, or no urine. Cracked lips. Dry mouth. Sunken eyes. Sleepiness. Weakness. You have bloody or black stools or stools that look like tar. You have severe pain, cramping, or bloating in your abdomen. Your skin feels cold and clammy. You feel confused. Get help right away if: You have chest pain or your heart is beating very quickly. You have trouble breathing or you are breathing very quickly. You feel extremely weak or you faint. These symptoms may be an emergency. Get help right away. Call 911. Do not wait to see if the symptoms will go away. Do not drive yourself to the hospital. This information is not intended to replace advice given to you by your health care provider. Make sure you discuss any questions you have with your health care provider. Document Revised: 03/22/2022 Document Reviewed: 03/22/2022 Elsevier Patient Education  2023 Elsevier Inc.  

## 2022-09-23 NOTE — Progress Notes (Signed)
   Subjective:    Patient ID: Brenda Cruz, female    DOB: 06-30-1966, 56 y.o.   MRN: [id number]  Chief Complaint: RASH ON RIGHT FLANK.  HPI Patient comes in with 2 complaints; - rash  on right flank- started yesterday- has not spread- is on right flank. Rates pain 4/10 right now.  - diarrhea- has had for 2 weeks. She has taken imodium. 5-6 bowle movements the last 2 days. Having some abdominal cramping.     Review of Systems     Objective:   Physical Exam Constitutional:      Appearance: Normal appearance.  Cardiovascular:     Rate and Rhythm: Normal rate and regular rhythm.     Heart sounds: Normal heart sounds.  Pulmonary:     Effort: Pulmonary effort is normal.     Breath sounds: Normal breath sounds.  Abdominal:     Tenderness: There is abdominal tenderness. There is no guarding.  Skin:    Comments: Erythematous papular patch on right flank. Slighty tender to touch.  Neurological:     General: No focal deficit present.     Mental Status: She is alert and oriented to person, place, and time.  Psychiatric:        Mood and Affect: Mood normal.        Behavior: Behavior normal.    BP (!) 144/76   Pulse 76   Temp (!) 96.8 F (36 C) (Skin)   Ht '5\' 8"'$  (1.727 m)   Wt 210 lb (95.3 kg)   LMP 06/03/2017 (Exact Date)   BMI 31.93 kg/m            Assessment & Plan:   Brenda Cruz in today with chief complaint of No chief complaint on file.   1. Functional diarrhea FORCE FLUIDS Eat banana Drink gatorade - Cdiff NAA+O+P+Stool Culture  2. Herpes zoster without complication Avoid rubbing or scratching - valACYclovir (VALTREX) 1000 MG tablet; Take 1 tablet (1,000 mg total) by mouth 3 (three) times daily for 10 days.  Dispense: 21 tablet; Refill: 0    The above assessment and management plan was discussed with the patient. The patient verbalized understanding of and has agreed to the management plan. Patient is aware to call the clinic if symptoms  persist or worsen. Patient is aware when to return to the clinic for a follow-up visit. Patient educated on when it is appropriate to go to the emergency department.   [name] [name], [name]

## 2022-09-28 ENCOUNTER — Telehealth: Payer: Self-pay | Admitting: Nurse Practitioner

## 2022-09-28 ENCOUNTER — Encounter: Payer: Self-pay | Admitting: Nurse Practitioner

## 2022-09-28 ENCOUNTER — Ambulatory Visit: Payer: Managed Care, Other (non HMO) | Admitting: Nurse Practitioner

## 2022-09-28 DIAGNOSIS — J0110 Acute frontal sinusitis, unspecified: Secondary | ICD-10-CM | POA: Diagnosis not present

## 2022-09-28 MED ORDER — AZITHROMYCIN 250 MG PO TABS
ORAL_TABLET | ORAL | 0 refills | Status: AC
Start: 2022-09-28 — End: 2022-10-03

## 2022-09-28 MED ORDER — FLUTICASONE PROPIONATE 50 MCG/ACT NA SUSP: 2.0000 | Freq: Every day | NASAL | 6 refills | Status: AC

## 2022-09-28 NOTE — Progress Notes (Signed)
   Virtual Visit  Note Due to COVID-19 pandemic this visit was conducted virtually. This visit type was conducted due to national recommendations for restrictions regarding the COVID-19 Pandemic (e.g. social distancing, sheltering in place) in an effort to limit this patient's exposure and mitigate transmission in our community. All issues noted in this document were discussed and addressed.  A physical exam was not performed with this format.  I connected with [name] on 09/28/22 at 10;20 am  by telephone and verified that I am speaking with the correct person using two identifiers. Brenda Cruz is currently located at home during visit. The provider, Ivy Lynn, NP is located in their office at time of visit.  I discussed the limitations, risks, security and privacy concerns of performing an evaluation and management service by telephone and the availability of in person appointments. I also discussed with the patient that there may be a patient responsible charge related to this service. The patient expressed understanding and agreed to proceed.   History and Present Illness:  HPI    ROS   Observations/Objective: Televisit patient not in distress.  Assessment and Plan: Patient presents with worsening unresolved sore throat for the past few days. Advised patient toTake meds as prescribed - Use a cool mist humidifier  -Gargle with salt water -Use saline nose sprays frequently -Force fluids -For fever or aches or pains- take Tylenol or ibuprofen. -If symptoms do not improve, she may need to be COVID tested to rule this out  Follow Up Instructions: Follow-up with unresolved symptoms    I discussed the assessment and treatment plan with the patient. The patient was provided an opportunity to ask questions and all were answered. The patient agreed with the plan and demonstrated an understanding of the instructions.   The patient was advised to call back or seek an  in-person evaluation if the symptoms worsen or if the condition fails to improve as anticipated.  The above assessment and management plan was discussed with the patient. The patient verbalized understanding of and has agreed to the management plan. Patient is aware to call the clinic if symptoms persist or worsen. Patient is aware when to return to the clinic for a follow-up visit. Patient educated on when it is appropriate to go to the emergency department.   Time call ended: 10:31 AM  I provided 11 minutes of  non face-to-face time during this encounter.    Ivy Lynn, NP

## 2022-09-28 NOTE — Telephone Encounter (Signed)
Its okay to give patient note. Thanks you

## 2022-09-28 NOTE — Patient Instructions (Signed)

## 2022-09-29 NOTE — Telephone Encounter (Signed)
Letter ready, pt aware

## 2022-10-02 ENCOUNTER — Other Ambulatory Visit: Payer: Self-pay | Admitting: Nurse Practitioner

## 2022-10-02 DIAGNOSIS — J411 Mucopurulent chronic bronchitis: Secondary | ICD-10-CM

## 2022-10-03 NOTE — Telephone Encounter (Signed)
Has to be seen  for pain meds

## 2022-10-31 ENCOUNTER — Encounter: Payer: Self-pay | Admitting: Nurse Practitioner

## 2022-10-31 ENCOUNTER — Ambulatory Visit: Payer: Managed Care, Other (non HMO) | Admitting: Nurse Practitioner

## 2022-10-31 VITALS — BP 151/83 | HR 74 | Temp 96.9°F | Resp 20 | Ht 68.0 in | Wt 209.0 lb

## 2022-10-31 DIAGNOSIS — J209 Acute bronchitis, unspecified: Secondary | ICD-10-CM | POA: Diagnosis not present

## 2022-10-31 DIAGNOSIS — R051 Acute cough: Secondary | ICD-10-CM

## 2022-10-31 DIAGNOSIS — R0602 Shortness of breath: Secondary | ICD-10-CM

## 2022-10-31 DIAGNOSIS — J440 Chronic obstructive pulmonary disease with acute lower respiratory infection: Secondary | ICD-10-CM | POA: Diagnosis not present

## 2022-10-31 LAB — RSV AG, IMMUNOCHR, WAIVED: RSV Ag, Immunochr, Waived: NEGATIVE

## 2022-10-31 LAB — VERITOR FLU A/B WAIVED
Influenza A: NEGATIVE
Influenza B: NEGATIVE

## 2022-10-31 MED ORDER — HYDROCODONE BIT-HOMATROP MBR 5-1.5 MG/5ML PO SOLN: 5.0000 mL | Freq: Three times a day (TID) | ORAL | 0 refills | Status: DC | PRN

## 2022-10-31 MED ORDER — AZITHROMYCIN 250 MG PO TABS
ORAL_TABLET | ORAL | 0 refills | Status: DC
Start: 2022-10-31 — End: 2022-11-18

## 2022-10-31 NOTE — Progress Notes (Signed)
Subjective:    Patient ID: Brenda Cruz, female    DOB: Jul 05, 1966, 57 y.o.   MRN: [id number]   Chief Complaint: Cough (Pain in back and left side/)   Cough This is a new problem. Episode onset: saturday. The problem has been gradually worsening. The problem occurs constantly. The cough is Productive of sputum. Associated symptoms include ear congestion, nasal congestion, a sore throat, shortness of breath and wheezing. Pertinent negatives include no fever (99.5). Nothing aggravates the symptoms. She has tried ipratropium inhaler and OTC cough suppressant (motrin, nebulizer) for the symptoms. The treatment provided moderate relief. Her past medical history is significant for COPD.    Patient very prone to pneumonia   Review of Systems  Constitutional:  Negative for fever (99.5).  HENT:  Positive for sore throat.   Respiratory:  Positive for cough, shortness of breath and wheezing.        Objective:   Physical Exam Vitals and nursing note reviewed.  Constitutional:      General: She is not in acute distress.    Appearance: Normal appearance. She is well-developed.  HENT:     Head: Normocephalic.     Right Ear: Tympanic membrane normal.     Left Ear: Tympanic membrane normal.     Nose: Nose normal.     Mouth/Throat:     Mouth: Mucous membranes are moist.  Eyes:     Pupils: Pupils are equal, round, and reactive to light.  Neck:     Vascular: No carotid bruit or JVD.  Cardiovascular:     Rate and Rhythm: Normal rate and regular rhythm.     Heart sounds: Normal heart sounds.  Pulmonary:     Effort: Pulmonary effort is normal. No respiratory distress.     Breath sounds: Normal breath sounds. No wheezing or rales.  Chest:     Chest wall: No tenderness.  Abdominal:     General: Bowel sounds are normal. There is no distension or abdominal bruit.     Palpations: Abdomen is soft. There is no hepatomegaly, splenomegaly, mass or pulsatile mass.     Tenderness: There is no  abdominal tenderness.  Musculoskeletal:        General: Normal range of motion.     Cervical back: Normal range of motion and neck supple.  Lymphadenopathy:     Cervical: No cervical adenopathy.  Skin:    General: Skin is warm and dry.  Neurological:     Mental Status: She is alert and oriented to person, place, and time.     Deep Tendon Reflexes: Reflexes are normal and symmetric.  Psychiatric:        Behavior: Behavior normal.        Thought Content: Thought content normal.        Judgment: Judgment normal.    BP (!) 151/83   Pulse 74   Temp (!) 96.9 F (36.1 C) (Temporal)   Resp 20   Ht '5\' 8"'$  (1.727 m)   Wt 209 lb (94.8 kg)   LMP 06/03/2017 (Exact Date)   SpO2 100%   BMI 31.78 kg/m         Assessment & Plan:  Brenda Cruz in today with chief complaint of Cough (Pain in back and left side/)   1. SOB (shortness of breath) - Veritor Flu A/B Waived - RSV Ag, Immunochr, Waived  2. Acute cough Covid result spending - Novel Coronavirus, NAA ([facility])  3. Acute bronchitis with COPD ([place]) 1.  Take meds as prescribed 2. Use a cool mist humidifier especially during the winter months and when heat has been humid. 3. Use saline nose sprays frequently 4. Saline irrigations of the nose can be very helpful if done frequently.  * 4X daily for 1 week*  * Use of a nettie pot can be helpful with this. Follow directions with this* 5. Drink plenty of fluids 6. Keep thermostat turn down low 7.For any cough or congestion- hycodan DM 8. For fever or aces or pains- take tylenol or ibuprofen appropriate for age and weight.  * for fevers greater than 101 orally you may alternate ibuprofen and tylenol every  3 hours.   Meds ordered this encounter  Medications   azithromycin (ZITHROMAX Z-PAK) 250 MG tablet    Sig: As directed    Dispense:  6 tablet    Refill:  0    Order Specific Question:   Supervising Provider    Answer:   Caryl Pina A [[id number]]   HYDROcodone  bit-homatropine (HYCODAN) 5-1.5 MG/5ML syrup    Sig: Take 5 mLs by mouth every 8 (eight) hours as needed for cough.    Dispense:  120 mL    Refill:  0    Order Specific Question:   Supervising Provider    Answer:   Caryl Pina A [[id number]]       The above assessment and management plan was discussed with the patient. The patient verbalized understanding of and has agreed to the management plan. Patient is aware to call the clinic if symptoms persist or worsen. Patient is aware when to return to the clinic for a follow-up visit. Patient educated on when it is appropriate to go to the emergency department.   [name] [name], [name]

## 2022-10-31 NOTE — Patient Instructions (Signed)
1. Take meds as prescribed 2. Use a cool mist humidifier especially during the winter months and when heat has been humid. 3. Use saline nose sprays frequently 4. Saline irrigations of the nose can be very helpful if done frequently.  * 4X daily for 1 week*  * Use of a nettie pot can be helpful with this. Follow directions with this* 5. Drink plenty of fluids 6. Keep thermostat turn down low 7.For any cough or congestion- hycodan 8. For fever or aces or pains- take tylenol or ibuprofen appropriate for age and weight.  * for fevers greater than 101 orally you may alternate ibuprofen and tylenol every  3 hours.    

## 2022-11-01 LAB — NOVEL CORONAVIRUS, NAA: SARS-CoV-2, NAA: NOT DETECTED

## 2022-11-03 NOTE — Telephone Encounter (Signed)
Ok for note?

## 2022-11-09 ENCOUNTER — Other Ambulatory Visit: Payer: Self-pay | Admitting: Nurse Practitioner

## 2022-11-09 DIAGNOSIS — J411 Mucopurulent chronic bronchitis: Secondary | ICD-10-CM

## 2022-11-18 ENCOUNTER — Ambulatory Visit (INDEPENDENT_AMBULATORY_CARE_PROVIDER_SITE_OTHER): Payer: Managed Care, Other (non HMO)

## 2022-11-18 ENCOUNTER — Ambulatory Visit: Payer: Managed Care, Other (non HMO)

## 2022-11-18 ENCOUNTER — Ambulatory Visit: Payer: Managed Care, Other (non HMO) | Admitting: Nurse Practitioner

## 2022-11-18 ENCOUNTER — Encounter: Payer: Self-pay | Admitting: Nurse Practitioner

## 2022-11-18 VITALS — BP 142/87 | HR 109 | Temp 97.4°F | Resp 20 | Ht 68.0 in | Wt 207.0 lb

## 2022-11-18 DIAGNOSIS — J441 Chronic obstructive pulmonary disease with (acute) exacerbation: Secondary | ICD-10-CM | POA: Diagnosis not present

## 2022-11-18 DIAGNOSIS — R0989 Other specified symptoms and signs involving the circulatory and respiratory systems: Secondary | ICD-10-CM

## 2022-11-18 DIAGNOSIS — J029 Acute pharyngitis, unspecified: Secondary | ICD-10-CM

## 2022-11-18 LAB — RAPID STREP SCREEN (MED CTR MEBANE ONLY): Strep Gp A Ag, IA W/Reflex: NEGATIVE

## 2022-11-18 LAB — CULTURE, GROUP A STREP

## 2022-11-18 LAB — VERITOR FLU A/B WAIVED
Influenza A: NEGATIVE
Influenza B: NEGATIVE

## 2022-11-18 MED ORDER — IPRATROPIUM-ALBUTEROL 0.5-2.5 (3) MG/3ML IN SOLN: 3.0000 mL | Freq: Four times a day (QID) | RESPIRATORY_TRACT | 0 refills | Status: AC | PRN

## 2022-11-18 MED ORDER — METHYLPREDNISOLONE ACETATE 80 MG/ML IJ SUSP
80.0000 mg | Freq: Once | INTRAMUSCULAR | Status: AC
  Administered 2022-11-18: 80 mg via INTRAMUSCULAR

## 2022-11-18 MED ORDER — HYDROCOD POLI-CHLORPHE POLI ER 10-8 MG/5ML PO SUER: 5.0000 mL | Freq: Two times a day (BID) | ORAL | 0 refills | Status: DC | PRN

## 2022-11-18 MED ORDER — PREDNISONE 20 MG PO TABS: 40.0000 mg | ORAL_TABLET | Freq: Every day | ORAL | 0 refills | Status: AC

## 2022-11-18 MED ORDER — DOXYCYCLINE HYCLATE 100 MG PO TABS: 100.0000 mg | ORAL_TABLET | Freq: Two times a day (BID) | ORAL | 0 refills | Status: DC

## 2022-11-18 NOTE — Addendum Note (Signed)
Addended by: [name] on: 11/18/2022 10:01 AM   Modules accepted: Orders

## 2022-11-18 NOTE — Progress Notes (Signed)
Subjective:    Patient ID: Brenda Cruz, female    DOB: 1965-12-04, 57 y.o.   MRN: [id number]   Chief Complaint: Cough, Sore Throat, and chest congestion   Cough This is a new problem. Episode onset: monday. The problem has been gradually worsening. The problem occurs every few minutes. The cough is Productive of sputum. Associated symptoms include ear congestion, ear pain, headaches, rhinorrhea, a sore throat, shortness of breath and wheezing. Pertinent negatives include no chills or fever. The symptoms are aggravated by lying down. She has tried OTC cough suppressant, OTC inhaler and prescription cough suppressant for the symptoms. The treatment provided mild relief. Her past medical history is significant for COPD.       Review of Systems  Constitutional:  Positive for fatigue. Negative for chills and fever.  HENT:  Positive for ear pain, rhinorrhea and sore throat.   Respiratory:  Positive for cough, shortness of breath and wheezing.   Neurological:  Positive for headaches. Negative for dizziness.       Objective:   Physical Exam Constitutional:      Appearance: She is well-developed.  HENT:     Right Ear: Tympanic membrane normal.     Left Ear: Tympanic membrane normal.     Nose: Congestion and rhinorrhea present.     Mouth/Throat:     Mouth: Mucous membranes are moist.     Pharynx: Posterior oropharyngeal erythema present.  Cardiovascular:     Rate and Rhythm: Normal rate and regular rhythm.     Heart sounds: Normal heart sounds.  Pulmonary:     Breath sounds: Wheezing (exp wheezes) present.  Musculoskeletal:     Cervical back: Normal range of motion and neck supple.  Skin:    General: Skin is warm.  Neurological:     General: No focal deficit present.     Mental Status: She is alert and oriented to person, place, and time.  Psychiatric:        Mood and Affect: Mood normal.        Behavior: Behavior normal.    BP (!) 142/87   Pulse (!) 109   Temp (!)  97.4 F (36.3 C) (Temporal)   Resp 20   Ht '5\' 8"'$  (1.727 m)   Wt 207 lb (93.9 kg)   LMP 06/03/2017 (Exact Date)   SpO2 100%   BMI 31.47 kg/m   Chest xray- no pneumonia-Preliminary reading by Ronnald Collum, [name]  [facility]        Assessment & Plan:  Brenda Cruz in today with chief complaint of Cough, Sore Throat, and chest congestion   1. Chest congestion - Veritor Flu A/B Waived - Novel Coronavirus, NAA ([facility]) - DG Chest 2 View  2. Sore throat - Rapid Strep Screen ([facility] ONLY)  3. COPD with acute exacerbation ([place]) 1. Take meds as prescribed 2. Use a cool mist humidifier especially during the winter months and when heat has been humid. 3. Use saline nose sprays frequently 4. Saline irrigations of the nose can be very helpful if done frequently.  * 4X daily for 1 week*  * Use of a nettie pot can be helpful with this. Follow directions with this* 5. Drink plenty of fluids 6. Keep thermostat turn down low 7.For any cough or congestion- tussionex 8. For fever or aces or pains- take tylenol or ibuprofen appropriate for age and weight.  * for fevers greater than 101 orally you may alternate ibuprofen and tylenol every  3 hours.    - doxycycline (VIBRA-TABS) 100 MG tablet; Take 1 tablet (100 mg total) by mouth 2 (two) times daily. 1 po bid  Dispense: 20 tablet; Refill: 0 - predniSONE (DELTASONE) 20 MG tablet; Take 2 tablets (40 mg total) by mouth daily with breakfast for 5 days. 2 po daily for 5 days  Dispense: 10 tablet; Refill: 0 - chlorpheniramine-HYDROcodone (TUSSIONEX) 10-8 MG/5ML; Take 5 mLs by mouth every 12 (twelve) hours as needed for cough.  Dispense: 120 mL; Refill: 0 - methylPREDNISolone acetate (DEPO-MEDROL) injection 80 mg    The above assessment and management plan was discussed with the patient. The patient verbalized understanding of and has agreed to the management plan. Patient is aware to call the clinic if symptoms persist or worsen. Patient is  aware when to return to the clinic for a follow-up visit. Patient educated on when it is appropriate to go to the emergency department.   [name] [name], [name]

## 2022-11-19 LAB — NOVEL CORONAVIRUS, NAA: SARS-CoV-2, NAA: NOT DETECTED

## 2022-11-21 NOTE — Telephone Encounter (Signed)
Ok  to extend work note till [employer]

## 2022-11-23 ENCOUNTER — Telehealth: Payer: Self-pay | Admitting: Nurse Practitioner

## 2022-11-24 NOTE — Telephone Encounter (Signed)
Ok to extend work note

## 2022-11-24 NOTE — Telephone Encounter (Signed)
Please review and advise.

## 2022-11-27 ENCOUNTER — Other Ambulatory Visit: Payer: Self-pay | Admitting: Nurse Practitioner

## 2022-11-27 ENCOUNTER — Other Ambulatory Visit: Payer: Self-pay

## 2022-11-27 ENCOUNTER — Emergency Department (HOSPITAL_COMMUNITY): Payer: Managed Care, Other (non HMO)

## 2022-11-27 ENCOUNTER — Emergency Department (HOSPITAL_COMMUNITY)
Admission: EM | Admit: 2022-11-27 | Discharge: 2022-11-27 | Disposition: A | Discharge status: Home / Self Care | Payer: Managed Care, Other (non HMO) | Attending: Emergency Medicine | Admitting: Emergency Medicine

## 2022-11-27 DIAGNOSIS — Z79899 Other long term (current) drug therapy: Secondary | ICD-10-CM | POA: Insufficient documentation

## 2022-11-27 DIAGNOSIS — R109 Unspecified abdominal pain: Secondary | ICD-10-CM | POA: Diagnosis present

## 2022-11-27 DIAGNOSIS — Z20822 Contact with and (suspected) exposure to covid-19: Secondary | ICD-10-CM | POA: Insufficient documentation

## 2022-11-27 DIAGNOSIS — E119 Type 2 diabetes mellitus without complications: Secondary | ICD-10-CM | POA: Insufficient documentation

## 2022-11-27 DIAGNOSIS — Z7984 Long term (current) use of oral hypoglycemic drugs: Secondary | ICD-10-CM | POA: Diagnosis not present

## 2022-11-27 DIAGNOSIS — Z7982 Long term (current) use of aspirin: Secondary | ICD-10-CM | POA: Diagnosis not present

## 2022-11-27 DIAGNOSIS — Z853 Personal history of malignant neoplasm of breast: Secondary | ICD-10-CM | POA: Insufficient documentation

## 2022-11-27 DIAGNOSIS — K439 Ventral hernia without obstruction or gangrene: Secondary | ICD-10-CM | POA: Diagnosis not present

## 2022-11-27 DIAGNOSIS — I10 Essential (primary) hypertension: Secondary | ICD-10-CM | POA: Insufficient documentation

## 2022-11-27 DIAGNOSIS — J45909 Unspecified asthma, uncomplicated: Secondary | ICD-10-CM | POA: Insufficient documentation

## 2022-11-27 LAB — RESP PANEL BY RT-PCR (RSV, FLU A&B, COVID)  RVPGX2
Influenza A by PCR: NEGATIVE
Influenza B by PCR: NEGATIVE
Resp Syncytial Virus by PCR: NEGATIVE
SARS Coronavirus 2 by RT PCR: NEGATIVE

## 2022-11-27 LAB — CBC WITH DIFFERENTIAL/PLATELET
Abs Immature Granulocytes: 0.2 10*3/uL — ABNORMAL HIGH (ref 0.00–0.07)
Basophils Absolute: 0.1 10*3/uL (ref 0.0–0.1)
Basophils Relative: 0 %
Eosinophils Absolute: 0.1 10*3/uL (ref 0.0–0.5)
Eosinophils Relative: 1 %
HCT: 38.6 % (ref 36.0–46.0)
Hemoglobin: 12.9 g/dL (ref 12.0–15.0)
Immature Granulocytes: 1 %
Lymphocytes Relative: 19 %
Lymphs Abs: 2.8 10*3/uL (ref 0.7–4.0)
MCH: 30.6 pg (ref 26.0–34.0)
MCHC: 33.4 g/dL (ref 30.0–36.0)
MCV: 91.7 fL (ref 80.0–100.0)
Monocytes Absolute: 0.9 10*3/uL (ref 0.1–1.0)
Monocytes Relative: 6 %
Neutro Abs: 10.6 10*3/uL — ABNORMAL HIGH (ref 1.7–7.7)
Neutrophils Relative %: 73 %
Platelets: 207 10*3/uL (ref 150–400)
RBC: 4.21 MIL/uL (ref 3.87–5.11)
RDW: 12.8 % (ref 11.5–15.5)
WBC: 14.6 10*3/uL — ABNORMAL HIGH (ref 4.0–10.5)
nRBC: 0 % (ref 0.0–0.2)

## 2022-11-27 LAB — COMPREHENSIVE METABOLIC PANEL
ALT: 31 U/L (ref 0–44)
AST: 36 U/L (ref 15–41)
Albumin: 3.4 g/dL — ABNORMAL LOW (ref 3.5–5.0)
Alkaline Phosphatase: 72 U/L (ref 38–126)
Anion gap: 10 (ref 5–15)
BUN: 13 mg/dL (ref 6–20)
CO2: 26 mmol/L (ref 22–32)
Calcium: 8.7 mg/dL — ABNORMAL LOW (ref 8.9–10.3)
Chloride: 99 mmol/L (ref 98–111)
Creatinine, Ser: 1.2 mg/dL — ABNORMAL HIGH (ref 0.44–1.00)
GFR, Estimated: 54 mL/min — ABNORMAL LOW (ref >60)
Glucose, Bld: 141 mg/dL — ABNORMAL HIGH (ref 70–99)
Potassium: 4.3 mmol/L (ref 3.5–5.1)
Sodium: 135 mmol/L (ref 135–145)
Total Bilirubin: 0.5 mg/dL (ref 0.3–1.2)
Total Protein: 6.7 g/dL (ref 6.5–8.1)

## 2022-11-27 LAB — URINALYSIS, ROUTINE W REFLEX MICROSCOPIC
Bilirubin Urine: NEGATIVE
Glucose, UA: NEGATIVE mg/dL
Hgb urine dipstick: NEGATIVE
Ketones, ur: NEGATIVE mg/dL
Nitrite: NEGATIVE
Protein, ur: 30 mg/dL — AB
Specific Gravity, Urine: 1.011 (ref 1.005–1.030)
pH: 5 (ref 5.0–8.0)

## 2022-11-27 LAB — LIPASE, BLOOD: Lipase: 71 U/L — ABNORMAL HIGH (ref 11–51)

## 2022-11-27 MED ORDER — NALOXONE HCL 0.4 MG/ML IJ SOLN: 0.4000 mg | Freq: Once | INTRAMUSCULAR | Status: DC

## 2022-11-27 MED ORDER — ONDANSETRON 4 MG PO TBDP: 4.0000 mg | ORAL_TABLET | Freq: Three times a day (TID) | ORAL | 0 refills | Status: DC | PRN

## 2022-11-27 MED ORDER — MORPHINE SULFATE (PF) 4 MG/ML IV SOLN
4.0000 mg | Freq: Once | INTRAVENOUS | Status: AC
  Administered 2022-11-27: 4 mg via INTRAVENOUS
  Filled 2022-11-27: qty 1

## 2022-11-27 MED ORDER — SODIUM CHLORIDE 0.9 % IV BOLUS
1000.0000 mL | Freq: Once | INTRAVENOUS | Status: AC
  Administered 2022-11-27: 1000 mL via INTRAVENOUS

## 2022-11-27 MED ORDER — ONDANSETRON HCL 4 MG/2ML IJ SOLN
4.0000 mg | Freq: Once | INTRAMUSCULAR | Status: AC
  Administered 2022-11-27: 4 mg via INTRAVENOUS
  Filled 2022-11-27: qty 2

## 2022-11-27 MED ORDER — HYDROCODONE-ACETAMINOPHEN 5-325 MG PO TABS: 1.0000 | ORAL_TABLET | ORAL | 0 refills | Status: DC | PRN

## 2022-11-27 NOTE — ED Triage Notes (Signed)
Pt bib ems from home c/o generalized abdominal pain started today, intermittent 2/10, worse with movement. Has had flu like symptoms x 2 weeks, negative for flu/ covid. Pt received Zofran en route.

## 2022-11-27 NOTE — ED Provider Notes (Signed)
[facility] [facility] Provider Note   CSN: [id number] Arrival date & time: 11/27/22  1513     History  Chief Complaint  Patient presents with   Abdominal Pain    Brenda Cruz is a 57 y.o. female.  Pt is a 57 yo female with pmhx significant for asthma, gerd, ddd, hld, anxiety, anemia, PE, htn, breast cancer, and dm2.  Pt has been sick with uri sx for about a week and a half.  She has been coughing a lot.  Today, she coughed and felt severe abd pain.  She has had what sounds to be abd wall hernias that she's been able to push back in.  Today, 2 spots felt like they "popped out."  She was able to push them back in.  She feels nauseous and still has some pain; although the pain is better than it was.        Home Medications Prior to Admission medications   Medication Sig Start Date End Date Taking? Authorizing Provider  acetaminophen (TYLENOL) 500 MG tablet Take 1,000 mg by mouth every 6 (six) hours as needed for mild pain.   Yes [provider]  albuterol (VENTOLIN HFA) 108 (90 Base) MCG/ACT inhaler Inhale 2 puffs into the lungs every 4 (four) hours as needed for wheezing or shortness of breath. 12/30/19  Yes Terald Sleeper, PA-C  amLODipine (NORVASC) 5 MG tablet Take 1 tablet (5 mg total) by mouth daily. 08/29/22  Yes Martin, [name], [name]  aspirin EC 81 MG tablet Take 81 mg by mouth every evening.    Yes [provider]  calcium carbonate (OSCAL) 1500 (600 Ca) MG TABS tablet Take 600 mg of elemental calcium by mouth daily with breakfast.   Yes [provider]  diphenhydrAMINE (BENADRYL) 25 MG tablet Take 25 mg by mouth every 6 (six) hours as needed for itching or allergies.   Yes [provider]  esomeprazole (NEXIUM) 40 MG capsule Take 1 capsule (40 mg total) by mouth daily. 08/29/22  Yes Martin, [name], [name]  FLUoxetine (PROZAC) 40 MG capsule Take 2 capsules (80 mg total) by mouth daily. TAKE ONE (1)  CAPSULE EACH DAY 08/29/22  Yes Martin, [name], [name]  fluticasone (FLONASE) 50 MCG/ACT nasal spray Place 2 sprays into both nostrils daily. 09/28/22  Yes Ivy Lynn, NP  HYDROcodone-acetaminophen (NORCO/VICODIN) 5-325 MG tablet Take 1 tablet by mouth every 4 (four) hours as needed. 11/27/22  Yes Isla Pence, MD  ipratropium-albuterol (DUONEB) 0.5-2.5 (3) MG/3ML SOLN Take 3 mLs by nebulization every 6 (six) hours as needed (Foir shortness of breath). 11/18/22  Yes Martin, [name], [name]  LORazepam (ATIVAN) 0.5 MG tablet Take 1 tablet (0.5 mg total) by mouth every 8 (eight) hours as needed for anxiety. 08/29/22  Yes Martin, [name], [name]  magnesium oxide (MAG-OX) 400 MG tablet Take 1 tablet (400 mg total) by mouth daily. Patient taking differently: Take 200 mg by mouth 2 (two) times daily. 08/29/22  Yes [name], [name], [name]  metFORMIN (GLUCOPHAGE) 1000 MG tablet Take 1 tablet (1,000 mg total) by mouth 2 (two) times daily. 08/29/22  Yes [name], [name], [name]  metoprolol tartrate (LOPRESSOR) 50 MG tablet Take 1 tablet (50 mg total) by mouth 2 (two) times daily. 08/29/22  Yes Martin, [name], [name]  Multiple Vitamin (MULTIVITAMIN WITH MINERALS) TABS tablet Take 1 tablet by mouth daily.   Yes [provider]  nitroGLYCERIN (NITROSTAT) 0.4 MG SL tablet Place 1 tablet (0.4 mg total)  under the tongue every 5 (five) minutes as needed for chest pain. 06/19/19 11/27/22 Yes Duke, Tami Lin, PA  ondansetron (ZOFRAN-ODT) 4 MG disintegrating tablet Take 1 tablet (4 mg total) by mouth every 8 (eight) hours as needed. 11/27/22  Yes Isla Pence, MD  ondansetron (ZOFRAN-ODT) 8 MG disintegrating tablet TAKE 1 TABLET BY MOUTH EVERY 8 HOURS AS NEEDED FOR NAUSEA & VOMITING 11/10/22  Yes [name] [name], [name]  blood glucose meter kit and supplies Dispense based on patient and insurance preference. Use up to four times daily as directed. (FOR ICD-10 E10.9, E11.9). 11/04/19    [name], [name], [name]  chlorpheniramine-HYDROcodone (TUSSIONEX) 10-8 MG/5ML Take 5 mLs by mouth every 12 (twelve) hours as needed for cough. Patient not taking: Reported on 11/27/2022 11/18/22   [name], [name]  cyclobenzaprine (FLEXERIL) 10 MG tablet Take 1 tablet (10 mg total) by mouth 3 (three) times daily as needed for muscle spasms. Patient not taking: Reported on 11/27/2022 05/25/20   [name], [name]  doxycycline (VIBRA-TABS) 100 MG tablet Take 1 tablet (100 mg total) by mouth 2 (two) times daily. 1 po bid Patient not taking: Reported on 11/27/2022 11/18/22   [name], [name]  HYDROcodone bit-homatropine (HYCODAN) 5-1.5 MG/5ML syrup Take 5 mLs by mouth every 8 (eight) hours as needed for cough. Patient not taking: Reported on 11/27/2022 10/31/22   [name], [name]      Allergies    Atorvastatin, Iohexol, Ace inhibitors, Farxiga [dapagliflozin], Heparin, Iodinated contrast media, Penicillins, and Sulfa antibiotics    Review of Systems   Review of Systems  Respiratory:  Positive for cough.   Gastrointestinal:  Positive for abdominal pain and nausea.  All other systems reviewed and are negative.   Physical Exam Updated Vital Signs BP (!) 150/64   Pulse 63   Temp 97.9 F (36.6 C) (Oral)   Resp 17   Ht 5' 7"$  (1.702 m)   Wt 93.9 kg   LMP 06/03/2017 (Exact Date)   SpO2 100%   BMI 32.42 kg/m  Physical Exam Vitals and nursing note reviewed.  Constitutional:      Appearance: She is well-developed. She is obese.  HENT:     Head: Normocephalic and atraumatic.     Mouth/Throat:     Mouth: Mucous membranes are moist.     Pharynx: Oropharynx is clear.  Eyes:     Extraocular Movements: Extraocular movements intact.     Pupils: Pupils are equal, round, and reactive to light.  Cardiovascular:     Rate and Rhythm: Normal rate and regular rhythm.     Heart sounds: Normal heart sounds.  Pulmonary:     Effort: Pulmonary effort is normal.      Breath sounds: Normal breath sounds.  Abdominal:     General: Abdomen is flat. Bowel sounds are normal.     Palpations: Abdomen is soft.     Tenderness: There is generalized abdominal tenderness.    Skin:    General: Skin is warm.     Capillary Refill: Capillary refill takes less than 2 seconds.  Neurological:     General: No focal deficit present.     Mental Status: She is alert and oriented to person, place, and time.  Psychiatric:        Mood and Affect: Mood normal.        Behavior: Behavior normal.     ED Results / Procedures / Treatments   Labs (all labs ordered are listed, but only abnormal results are displayed)  Labs Reviewed  CBC WITH DIFFERENTIAL/PLATELET - Abnormal; Notable for the following components:      Result Value   WBC 14.6 (*)    Neutro Abs 10.6 (*)    Abs Immature Granulocytes 0.20 (*)    All other components within normal limits  COMPREHENSIVE METABOLIC PANEL - Abnormal; Notable for the following components:   Glucose, Bld 141 (*)    Creatinine, Ser 1.20 (*)    Calcium 8.7 (*)    Albumin 3.4 (*)    GFR, Estimated 54 (*)    All other components within normal limits  LIPASE, BLOOD - Abnormal; Notable for the following components:   Lipase 71 (*)    All other components within normal limits  URINALYSIS, ROUTINE W REFLEX MICROSCOPIC - Abnormal; Notable for the following components:   APPearance HAZY (*)    Protein, ur 30 (*)    Leukocytes,Ua SMALL (*)    Bacteria, UA MANY (*)    All other components within normal limits  RESP PANEL BY RT-PCR (RSV, FLU A&B, COVID)  [id number]    EKG None  Radiology DG Chest Portable 1 View  Result Date: 11/27/2022 CLINICAL DATA:  Cough. Abdominal pain. EXAM: PORTABLE CHEST 1 VIEW COMPARISON:  Chest radiograph 11/18/2022. FINDINGS: Post median sternotomy. The heart is normal in size. The cardiomediastinal contours are normal. Subsegmental atelectasis at the right lung base. Pulmonary vasculature is normal. No  confluent consolidation, pleural effusion, or pneumothorax. No acute osseous abnormalities are seen. IMPRESSION: Subsegmental right basilar atelectasis. Electronically Signed   By: Keith Rake M.D.   On: 11/27/2022 16:24   CT ABDOMEN PELVIS WO CONTRAST  Result Date: 11/27/2022 CLINICAL DATA:  Abdominal wall hernia suspected. Generalized abdominal pain worse with movement. EXAM: CT ABDOMEN AND PELVIS WITHOUT CONTRAST TECHNIQUE: Multidetector CT imaging of the abdomen and pelvis was performed following the standard protocol without IV contrast. RADIATION DOSE REDUCTION: This exam was performed according to the departmental dose-optimization program which includes automated exposure control, adjustment of the mA and/or kV according to patient size and/or use of iterative reconstruction technique. COMPARISON:  None Available. FINDINGS: Lower chest: No acute abnormality. Hepatobiliary: No focal liver abnormality is seen. Status post cholecystectomy. No biliary dilatation. Pancreas: Unremarkable. No pancreatic ductal dilatation or surrounding inflammatory changes. Spleen: Normal in size without focal abnormality. Adrenals/Urinary Tract: Adrenal glands are unremarkable. Kidneys are normal, without renal calculi, focal lesion, or hydronephrosis. Bladder is unremarkable. Stomach/Bowel: Stomach is within normal limits. Appendix appears normal. No evidence of bowel wall thickening, distention, or inflammatory changes. Vascular/Lymphatic: Limited evaluation in the absence of intravenous contrast. Mild scattered atherosclerotic plaque throughout the abdominal aorta. No evidence of aneurysm. No suspicious lymphadenopathy. Reproductive: Uterus and bilateral adnexa are unremarkable. Other: Small fat containing ventral umbilical hernia. No evidence of ascites. Musculoskeletal: Surgical changes of prior L5 laminectomy with L5-S1 posterior lumbar interbody fusion with interbody graft in place. IMPRESSION: 1. Small fat  containing midline ventral abdominal wall hernia just superior to the umbilicus. 2. Otherwise, no acute abnormality. 3.  Aortic Atherosclerosis (ICD10-I70.0). Electronically Signed   By: Jacqulynn Cadet M.D.   On: 11/27/2022 16:24    Procedures Procedures    Medications Ordered in ED Medications  sodium chloride 0.9 % bolus 1,000 mL (0 mLs Intravenous Stopped 11/27/22 1832)  ondansetron (ZOFRAN) injection 4 mg (4 mg Intravenous Given 11/27/22 1638)  morphine (PF) 4 MG/ML injection 4 mg (4 mg Intravenous Given 11/27/22 1638)    ED Course/ Medical Decision Making/ A&P  Medical Decision Making Amount and/or Complexity of Data Reviewed Labs: ordered. Radiology: ordered.  Risk Prescription drug management.   This patient presents to the ED for concern of abd pain, this involves an extensive number of treatment options, and is a complaint that carries with it a high risk of complications and morbidity.  The differential diagnosis includes sbo, hernias, infection   Co morbidities that complicate the patient evaluation  asthma, gerd, ddd, hld, anxiety, anemia, PE, htn, breast cancer, and dm2   Additional history obtained:  Additional history obtained from epic chart review External records from outside source obtained and reviewed including husband   Lab Tests:  I Ordered, and personally interpreted labs.  The pertinent results include:  ua is contaminated; covid/flu/rsv neg; wbc slightly elevated at 14.6; cmp with cr sl elevated at 1.2 (chronic)   Imaging Studies ordered:  I ordered imaging studies including cxr and ct abd/pelvis  I independently visualized and interpreted imaging which showed  CXR: IMPRESSION:  Subsegmental right basilar atelectasis.  CT abd/pelvis: Small fat containing midline ventral abdominal wall hernia just  superior to the umbilicus.  2. Otherwise, no acute abnormality.  3.  Aortic Atherosclerosis (ICD10-I70.0).   I  agree with the radiologist interpretation   Cardiac Monitoring:  The patient was maintained on a cardiac monitor.  I personally viewed and interpreted the cardiac monitored which showed an underlying rhythm of: nsr   Medicines ordered and prescription drug management:  I ordered medication including morphine/zofran  for pain/nausea  Reevaluation of the patient after these medicines showed that the patient improved I have reviewed the patients home medicines and have made adjustments as needed   Test Considered:  ct   Critical Interventions:  Pain control   Problem List / ED Course:  Ventral hernia:  pt still has residual hernia on ct, but it just contains fat.  She feels much better.  She is to f/u with surgery.  Return if worse.    Reevaluation:  After the interventions noted above, I reevaluated the patient and found that they have :improved   Social Determinants of Health:  Lives at home   Dispostion:  After consideration of the diagnostic results and the patients response to treatment, I feel that the patent would benefit from discharge with outpatient f/u.          Final Clinical Impression(s) / ED Diagnoses Final diagnoses:  Ventral hernia without obstruction or gangrene    Rx / DC Orders ED Discharge Orders          Ordered    HYDROcodone-acetaminophen (NORCO/VICODIN) 5-325 MG tablet  Every 4 hours PRN        11/27/22 1834    ondansetron (ZOFRAN-ODT) 4 MG disintegrating tablet  Every 8 hours PRN        11/27/22 1834              Isla Pence, MD 11/27/22 1836

## 2022-11-29 ENCOUNTER — Encounter: Payer: Self-pay | Admitting: Nurse Practitioner

## 2022-11-29 ENCOUNTER — Telehealth: Payer: Self-pay | Admitting: Nurse Practitioner

## 2022-11-29 ENCOUNTER — Ambulatory Visit: Payer: Managed Care, Other (non HMO) | Admitting: Nurse Practitioner

## 2022-11-29 VITALS — BP 142/76 | HR 68 | Temp 97.2°F | Resp 20 | Ht 67.0 in | Wt 206.0 lb

## 2022-11-29 DIAGNOSIS — I10 Essential (primary) hypertension: Secondary | ICD-10-CM | POA: Diagnosis not present

## 2022-11-29 DIAGNOSIS — J411 Mucopurulent chronic bronchitis: Secondary | ICD-10-CM

## 2022-11-29 DIAGNOSIS — K219 Gastro-esophageal reflux disease without esophagitis: Secondary | ICD-10-CM | POA: Diagnosis not present

## 2022-11-29 DIAGNOSIS — F411 Generalized anxiety disorder: Secondary | ICD-10-CM

## 2022-11-29 DIAGNOSIS — I25119 Atherosclerotic heart disease of native coronary artery with unspecified angina pectoris: Secondary | ICD-10-CM

## 2022-11-29 DIAGNOSIS — N393 Stress incontinence (female) (male): Secondary | ICD-10-CM

## 2022-11-29 DIAGNOSIS — Z720 Tobacco use: Secondary | ICD-10-CM

## 2022-11-29 DIAGNOSIS — E669 Obesity, unspecified: Secondary | ICD-10-CM

## 2022-11-29 DIAGNOSIS — F3341 Major depressive disorder, recurrent, in partial remission: Secondary | ICD-10-CM

## 2022-11-29 DIAGNOSIS — R5383 Other fatigue: Secondary | ICD-10-CM

## 2022-11-29 DIAGNOSIS — E782 Mixed hyperlipidemia: Secondary | ICD-10-CM

## 2022-11-29 DIAGNOSIS — D508 Other iron deficiency anemias: Secondary | ICD-10-CM

## 2022-11-29 DIAGNOSIS — R790 Abnormal level of blood mineral: Secondary | ICD-10-CM

## 2022-11-29 DIAGNOSIS — E1165 Type 2 diabetes mellitus with hyperglycemia: Secondary | ICD-10-CM

## 2022-11-29 DIAGNOSIS — C50912 Malignant neoplasm of unspecified site of left female breast: Secondary | ICD-10-CM

## 2022-11-29 LAB — LIPID PANEL

## 2022-11-29 LAB — BAYER DCA HB A1C WAIVED: HB A1C (BAYER DCA - WAIVED): 9 % — ABNORMAL HIGH (ref 4.8–5.6)

## 2022-11-29 MED ORDER — LORAZEPAM 0.5 MG PO TABS: 0.5000 mg | ORAL_TABLET | Freq: Three times a day (TID) | ORAL | 2 refills | Status: DC | PRN

## 2022-11-29 MED ORDER — MAGNESIUM OXIDE 400 MG PO TABS: 400.0000 mg | ORAL_TABLET | Freq: Every day | ORAL | 1 refills | Status: DC

## 2022-11-29 MED ORDER — BUDESONIDE-FORMOTEROL FUMARATE 80-4.5 MCG/ACT IN AERO: 2.0000 | INHALATION_SPRAY | Freq: Two times a day (BID) | RESPIRATORY_TRACT | 3 refills | Status: DC

## 2022-11-29 MED ORDER — RYBELSUS 3 MG PO TABS: 3.0000 mg | ORAL_TABLET | Freq: Every day | ORAL | 1 refills | Status: DC

## 2022-11-29 NOTE — Addendum Note (Signed)
Addended by: [name] on: 11/29/2022 03:54 PM   Modules accepted: Orders

## 2022-11-29 NOTE — Patient Instructions (Signed)

## 2022-11-29 NOTE — Progress Notes (Signed)
Refills failed. resent

## 2022-11-29 NOTE — Progress Notes (Signed)
Subjective:    Patient ID: Brenda Cruz, female    DOB: 1966-05-14, 57 y.o.   MRN: [id number]   Chief Complaint: medical management of chronic issues     HPI:  Brenda Cruz is a 57 y.o. who identifies as a female who was assigned female at birth.   Social history: Lives with: husband and son Work history: accounts payable at [employer] in today for follow up of the following chronic medical issues:  1. Essential hypertension No c/o chest pain of headaches. Has had lots of SOB lately but has been really sick wit upper respiratory infection. BP Readings from Last 3 Encounters:  11/27/22 (!) 150/64  11/18/22 (!) 142/87  10/31/22 (!) 151/83     2. Coronary artery disease involving native coronary artery of native heart with angina pectoris [facility]) Patient has not seen her cardiologist in over a year.  3. Mucopurulent chronic bronchitis (HCC) Has COPD from smoking  4. Gastroesophageal reflux disease without esophagitis Is on nexium daily. Usually helps with any symptoms  5. Type 2 diabetes mellitus with hyperglycemia, without long-term current use of insulin (HCC) She does not check her blood suars everyday. And since he has been sick she has not been checking them, no medication change swere made at last visit. Lab Results  Component Value Date   HGBA1C 7.9 (H) 08/29/2022      6. Mixed hyperlipidemia Does not watch diet and does no exercise at all. Lab Results  Component Value Date   CHOL 322 (H) 08/29/2022   HDL 30 (L) 08/29/2022   LDLCALC 199 (H) 08/29/2022   TRIG 446 (H) 08/29/2022   CHOLHDL 10.7 (H) 08/29/2022     7. Stress incontinence of urine No better. Every time she coughs she wets  in her pants  8. Recurrent major depressive disorder, in partial remission [facility]) Patient has been depressed for many years. Hermom died about 6 miomths ago and that increased herdepression. She is still on prozac.    11/29/2022    9:21 AM 11/18/2022     9:47 AM 10/31/2022   10:48 AM  Depression screen PHQ 2/9  Decreased Interest 1 1 1  $ Down, Depressed, Hopeless 1 1 0  PHQ - 2 Score 2 2 1  $ Altered sleeping 2 1 2  $ Tired, decreased energy 2 1 1  $ Change in appetite 1 0 0  Feeling bad or failure about yourself  0 0 0  Trouble concentrating 1  1  Moving slowly or fidgety/restless 0 0 0  Suicidal thoughts 0 0   PHQ-9 Score 8 4 5  $ Difficult doing work/chores Somewhat difficult Not difficult at all Not difficult at all     9. GAD (generalized anxiety disorder) Takes ativan 3x a day.    11/29/2022    9:21 AM 11/18/2022    9:48 AM 10/31/2022   10:49 AM 09/23/2022    3:39 PM  GAD 7 : Generalized Anxiety Score  Nervous, Anxious, on Edge 1 2 2 2  $ Control/stop worrying 1 2 1 1  $ Worry too much - different things 1 2 1 1  $ Trouble relaxing 1 2 2 3  $ Restless 0 1 1 2  $ Easily annoyed or irritable 1 2 2 2  $ Afraid - awful might happen 1 2 0 1  Total GAD 7 Score 6 13 9 12  $ Anxiety Difficulty Somewhat difficult Somewhat difficult Somewhat difficult Somewhat difficult       10. Iron deficiency anemia secondary  to inadequate dietary iron intake Is very fatigued Lab Results  Component Value Date   HGB 12.9 11/27/2022     11. Infiltrating ductal carcinoma of left breast ([place]) Had bil breast US on 09/01/22 and was clear.  12. Tobacco abuse Still smokes over a pack a day.   13. Obesity with body mass index of 30.0-39.9 No recent weight changes   New complaints: Patient has been sick off and on for the last year. She has had pneumonia, covid and several resp infections. 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   Sulfa Antibiotics Itching, Rash and Other (See Comments)   Outpatient Encounter Medications as of 11/29/2022  Medication Sig   acetaminophen (TYLENOL) 500 MG tablet Take 1,000 mg by mouth every 6 (six) hours as needed for mild pain.   albuterol (VENTOLIN HFA) 108 (90 Base) MCG/ACT inhaler Inhale 2 puffs into the lungs every 4 (four) hours as needed for wheezing or shortness of breath.   amLODipine (NORVASC) 5 MG tablet Take 1 tablet (5 mg total) by mouth daily.   aspirin EC 81 MG tablet Take 81 mg by mouth every evening.    blood glucose meter kit and supplies Dispense based on patient and insurance preference. Use up to four times daily as directed. (FOR ICD-10 E10.9, E11.9).   calcium carbonate (OSCAL) 1500 (600 Ca) MG TABS tablet Take 600 mg of elemental calcium by mouth daily with breakfast.   chlorpheniramine-HYDROcodone (TUSSIONEX) 10-8 MG/5ML Take 5 mLs by mouth every 12 (twelve) hours as needed for cough. (Patient not taking: Reported on 11/27/2022)   cyclobenzaprine (FLEXERIL) 10 MG tablet TAKE ONE TABLET BY MOUTH THREE TIMES DAILY AS NEEDED FOR MUSCLE SPASM   diphenhydrAMINE (BENADRYL) 25 MG tablet Take 25 mg by mouth every 6 (six) hours as needed for itching or allergies.   doxycycline (VIBRA-TABS) 100 MG tablet Take 1 tablet (100 mg total) by mouth 2 (two) times daily. 1 po bid  (Patient not taking: Reported on 11/27/2022)   esomeprazole (NEXIUM) 40 MG capsule Take 1 capsule (40 mg total) by mouth daily.   FLUoxetine (PROZAC) 40 MG capsule Take 2 capsules (80 mg total) by mouth daily. TAKE ONE (1) CAPSULE EACH DAY   fluticasone (FLONASE) 50 MCG/ACT nasal spray Place 2 sprays into both nostrils daily.   HYDROcodone bit-homatropine (HYCODAN) 5-1.5 MG/5ML syrup Take 5 mLs by mouth every 8 (eight) hours as needed for cough. (Patient not taking: Reported on 11/27/2022)   HYDROcodone-acetaminophen (NORCO/VICODIN) 5-325 MG tablet Take 1  tablet by mouth every 4 (four) hours as needed.   ipratropium-albuterol (DUONEB) 0.5-2.5 (3) MG/3ML SOLN Take 3 mLs by nebulization every 6 (six) hours as needed (Foir shortness of breath).   LORazepam (ATIVAN) 0.5 MG tablet Take 1 tablet (0.5 mg total) by mouth every 8 (eight) hours as needed for anxiety.   magnesium oxide (MAG-OX) 400 MG tablet Take 1 tablet (400 mg total) by mouth daily. (Patient taking differently: Take 200 mg by mouth 2 (two) times daily.)   metFORMIN (GLUCOPHAGE) 1000 MG tablet Take 1 tablet (1,000 mg total) by mouth 2 (two) times daily.   metoprolol tartrate (LOPRESSOR) 50 MG tablet Take 1 tablet (50 mg total) by mouth 2 (two) times daily.   Multiple Vitamin (MULTIVITAMIN WITH MINERALS) TABS tablet Take 1 tablet by mouth daily.   nitroGLYCERIN (NITROSTAT) 0.4 MG SL tablet Place 1 tablet (0.4 mg total) under the tongue every 5 (five) minutes as needed for chest pain.   ondansetron (ZOFRAN-ODT) 4 MG disintegrating tablet Take 1 tablet (4 mg total) by mouth every 8 (eight) hours as needed.   ondansetron (ZOFRAN-ODT) 8 MG disintegrating tablet TAKE 1 TABLET BY MOUTH EVERY 8 HOURS AS NEEDED FOR NAUSEA & VOMITING   No facility-administered encounter medications on file as of 11/29/2022.    Past Surgical History:  Procedure Laterality Date   CARDIAC CATHETERIZATION N/A 10/29/2015   Procedure: Left Heart Cath and Coronary  Angiography;  Surgeon: [name], MD;  Location: [place] [facility];  Service: Cardiovascular;  Laterality: N/A;   CHOLECYSTECTOMY     CORONARY ARTERY BYPASS GRAFT N/A 11/02/2015   Procedure: CORONARY ARTERY BYPASS GRAFTING (CABG)x 1 using left internal mammary artery.;  Surgeon: Melrose Nakayama, MD;  Location: [place];  Service: Open Heart Surgery;  Laterality: N/A;   LEFT OOPHORECTOMY Left    Related to tubal torsion.  Bilateral salpingectomy also.  Remaining uterus and right ovary   LUMBAR FUSION     TEE WITHOUT CARDIOVERSION N/A 11/02/2015   Procedure: TRANSESOPHAGEAL ECHOCARDIOGRAM (TEE);  Surgeon: Melrose Nakayama, MD;  Location: [place];  Service: Open Heart Surgery;  Laterality: N/A;    Family History  Problem Relation Age of Onset   Colon polyps Father    Stomach cancer Paternal Grandmother    Esophageal cancer Maternal Grandfather    Breast cancer Mother    Ovarian cancer Other        Maternal Side Great  Aunt   Colon cancer Neg Hx       Controlled substance contract: 08/30/22  Hgba1c 9.0   Review of Systems  Constitutional:  Negative for diaphoresis.  Eyes:  Negative for pain.  Respiratory:  Positive for cough, shortness of breath and wheezing.   Cardiovascular:  Negative for chest pain, palpitations and leg swelling.  Gastrointestinal:  Negative for abdominal pain.  Endocrine: Negative for polydipsia.  Skin:  Negative for rash.  Neurological:  Negative for dizziness, weakness and headaches.  Hematological:  Does not bruise/bleed easily.  All other systems reviewed and are negative.      Objective:   Physical Exam Vitals and nursing note reviewed.  Constitutional:      General: She is not in acute distress.    Appearance: Normal appearance. She is well-developed.  HENT:     Head: Normocephalic.     Right Ear: Tympanic membrane normal.     Left Ear: Tympanic membrane normal.     Nose: Nose normal.     Mouth/Throat:  Mouth: Mucous membranes  are moist.  Eyes:     Pupils: Pupils are equal, round, and reactive to light.  Neck:     Vascular: No carotid bruit or JVD.  Cardiovascular:     Rate and Rhythm: Normal rate and regular rhythm.     Heart sounds: Normal heart sounds.  Pulmonary:     Effort: Pulmonary effort is normal. No respiratory distress.     Breath sounds: Wheezing (exp throughout) present. No rales.  Chest:     Chest wall: No tenderness.  Abdominal:     General: Bowel sounds are normal. There is no distension or abdominal bruit.     Palpations: Abdomen is soft. There is no hepatomegaly, splenomegaly, mass or pulsatile mass.     Tenderness: There is no abdominal tenderness.  Musculoskeletal:        General: Normal range of motion.     Cervical back: Normal range of motion and neck supple.  Lymphadenopathy:     Cervical: No cervical adenopathy.  Skin:    General: Skin is warm and dry.  Neurological:     Mental Status: She is alert and oriented to person, place, and time.     Deep Tendon Reflexes: Reflexes are normal and symmetric.  Psychiatric:        Behavior: Behavior normal.        Thought Content: Thought content normal.        Judgment: Judgment normal.       BP (!) 172/100   Pulse 68   Temp (!) 97.2 F (36.2 C) (Temporal)   Resp 20   Ht 5' 7"$  (1.702 m)   Wt 206 lb (93.4 kg)   LMP 06/03/2017 (Exact Date)   SpO2 100%   BMI 32.26 kg/m   Hgba1c 9.0%    Assessment & Plan:   Brenda Cruz comes in today with chief complaint of Medical Management of Chronic Issues   Diagnosis and orders addressed:  1. Essential hypertension Low sodium diet - CBC with Differential/Platelet - CMP14+EGFR  2. Coronary artery disease involving native coronary artery of native heart with angina pectoris ([place]) Needs to follow up with cardiology - magnesium oxide (MAG-OX) 400 MG tablet; Take 1 tablet (400 mg total) by mouth daily.  Dispense: 90 tablet; Refill: 1  3. Mucopurulent chronic bronchitis  (HCC) Added symbicort to meds - budesonide-formoterol (SYMBICORT) 80-4.5 MCG/ACT inhaler; Inhale 2 puffs into the lungs 2 (two) times daily.  Dispense: 1 each; Refill: 3  4. Gastroesophageal reflux disease without esophagitis Avoid spicy foods Do not eat 2 hours prior to bedtime  - Lipase  5. Type 2 diabetes mellitus with hyperglycemia, without long-term current use of insulin (HCC) Added back rybelsus - Bayer DCA Hb A1c Waived - Semaglutide (RYBELSUS) 3 MG TABS; Take 1 tablet (3 mg total) by mouth daily.  Dispense: 90 tablet; Refill: 1  6. Mixed hyperlipidemia Low fat diet - Lipid panel  7. Stress incontinence of urine  8. Recurrent major depressive disorder, in partial remission ([place]) Stress mangement  9. GAD (generalized anxiety disorder) Stress manaegment - LORazepam (ATIVAN) 0.5 MG tablet; Take 1 tablet (0.5 mg total) by mouth every 8 (eight) hours as needed for anxiety.  Dispense: 90 tablet; Refill: 2  10. Iron deficiency anemia secondary to inadequate dietary iron intake Labs pending  11. 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) Yearly check ups  12. Tobacco abuse SMOKING CESSATION encouraged  13. Obesity with body mass index of 30.0-39.9 Discussed diet  and exercise for person with BMI >25 Will recheck weight in 3-6 months   14. Fatigue, unspecified type Labs pending - Thyroid Panel With TSH  15. Low magnesium level Lab spending - Magnesium   Labs pending Health Maintenance reviewed Diet and exercise encouraged  Follow up plan: 3 months   [name] [name], [name]

## 2022-11-29 NOTE — Telephone Encounter (Signed)
Meds ordered this encounter  Medications   budesonide-formoterol (SYMBICORT) 80-4.5 MCG/ACT inhaler    Sig: Inhale 2 puffs into the lungs 2 (two) times daily.    Dispense:  10.2 g    Refill:  3    Order Specific Question:   Supervising Provider    Answer:   Caryl Pina A [id number]

## 2022-11-29 NOTE — Addendum Note (Signed)
Addended by: Antonietta Barcelona D on: 11/29/2022 03:08 PM   Modules accepted: Orders

## 2022-11-30 LAB — CBC WITH DIFFERENTIAL/PLATELET
Basophils Absolute: 0.1 10*3/uL (ref 0.0–0.2)
Basos: 1 %
EOS (ABSOLUTE): 0.2 10*3/uL (ref 0.0–0.4)
Eos: 2 %
Hematocrit: 41 % (ref 34.0–46.6)
Hemoglobin: 13.6 g/dL (ref 11.1–15.9)
Immature Grans (Abs): 0.1 10*3/uL (ref 0.0–0.1)
Immature Granulocytes: 1 %
Lymphocytes Absolute: 2.7 10*3/uL (ref 0.7–3.1)
Lymphs: 27 %
MCH: 30.3 pg (ref 26.6–33.0)
MCHC: 33.2 g/dL (ref 31.5–35.7)
MCV: 91 fL (ref 79–97)
Monocytes Absolute: 0.7 10*3/uL (ref 0.1–0.9)
Monocytes: 7 %
Neutrophils Absolute: 6.1 10*3/uL (ref 1.4–7.0)
Neutrophils: 62 %
Platelets: 227 10*3/uL (ref 150–450)
RBC: 4.49 x10E6/uL (ref 3.77–5.28)
RDW: 12 % (ref 11.7–15.4)
WBC: 9.9 10*3/uL (ref 3.4–10.8)

## 2022-11-30 LAB — CMP14+EGFR
ALT: 46 IU/L — ABNORMAL HIGH (ref 0–32)
AST: 48 IU/L — ABNORMAL HIGH (ref 0–40)
Albumin/Globulin Ratio: 1.6 (ref 1.2–2.2)
Albumin: 4.3 g/dL (ref 3.8–4.9)
Alkaline Phosphatase: 111 IU/L (ref 44–121)
BUN/Creatinine Ratio: 8 — ABNORMAL LOW (ref 9–23)
BUN: 10 mg/dL (ref 6–24)
Bilirubin Total: 0.3 mg/dL (ref 0.0–1.2)
CO2: 18 mmol/L — ABNORMAL LOW (ref 20–29)
Calcium: 9.4 mg/dL (ref 8.7–10.2)
Chloride: 97 mmol/L (ref 96–106)
Creatinine, Ser: 1.19 mg/dL — ABNORMAL HIGH (ref 0.57–1.00)
Globulin, Total: 2.7 g/dL (ref 1.5–4.5)
Glucose: 259 mg/dL — ABNORMAL HIGH (ref 70–99)
Potassium: 4.9 mmol/L (ref 3.5–5.2)
Sodium: 138 mmol/L (ref 134–144)
Total Protein: 7 g/dL (ref 6.0–8.5)
eGFR: 54 mL/min/{1.73_m2} — ABNORMAL LOW (ref >59)

## 2022-11-30 LAB — LIPID PANEL
Chol/HDL Ratio: 7.9 ratio — ABNORMAL HIGH (ref 0.0–4.4)
Cholesterol, Total: 294 mg/dL — ABNORMAL HIGH (ref 100–199)
HDL: 37 mg/dL — ABNORMAL LOW (ref >39)
LDL Chol Calc (NIH): 179 mg/dL — ABNORMAL HIGH (ref 0–99)
Triglycerides: 394 mg/dL — ABNORMAL HIGH (ref 0–149)
VLDL Cholesterol Cal: 78 mg/dL — ABNORMAL HIGH (ref 5–40)

## 2022-11-30 LAB — THYROID PANEL WITH TSH
Free Thyroxine Index: 2.1 (ref 1.2–4.9)
T3 Uptake Ratio: 28 % (ref 24–39)
T4, Total: 7.5 ug/dL (ref 4.5–12.0)
TSH: 1.92 u[IU]/mL (ref 0.450–4.500)

## 2022-12-01 ENCOUNTER — Telehealth: Payer: Self-pay | Admitting: Nurse Practitioner

## 2022-12-01 LAB — MAGNESIUM: Magnesium: 1.4 mg/dL — ABNORMAL LOW (ref 1.6–2.3)

## 2022-12-01 LAB — SPECIMEN STATUS REPORT

## 2022-12-01 LAB — LIPASE: Lipase: 72 U/L (ref 14–72)

## 2022-12-01 NOTE — Telephone Encounter (Signed)
Patient also has a Therapist, music. Please refer to that message

## 2022-12-01 NOTE — Telephone Encounter (Signed)
I do not know about magnesium infusions. Let me check with DR dettinger and get back with you

## 2022-12-02 ENCOUNTER — Other Ambulatory Visit: Payer: Self-pay | Admitting: Nurse Practitioner

## 2022-12-02 DIAGNOSIS — R790 Abnormal level of blood mineral: Secondary | ICD-10-CM

## 2022-12-02 NOTE — Progress Notes (Signed)
Referral made 

## 2022-12-05 LAB — HM MAMMOGRAPHY

## 2022-12-06 ENCOUNTER — Other Ambulatory Visit: Payer: Self-pay

## 2022-12-06 ENCOUNTER — Encounter: Payer: Self-pay | Admitting: Surgery

## 2022-12-06 ENCOUNTER — Ambulatory Visit: Payer: Managed Care, Other (non HMO) | Admitting: Surgery

## 2022-12-06 ENCOUNTER — Telehealth: Payer: Self-pay

## 2022-12-06 VITALS — BP 148/88 | HR 77 | Temp 98.1°F | Resp 14 | Ht 67.0 in | Wt 209.0 lb

## 2022-12-06 DIAGNOSIS — K439 Ventral hernia without obstruction or gangrene: Secondary | ICD-10-CM | POA: Diagnosis not present

## 2022-12-06 DIAGNOSIS — R790 Abnormal level of blood mineral: Secondary | ICD-10-CM

## 2022-12-06 NOTE — Telephone Encounter (Signed)
Pt calling in upset because no urgent referral apt has been setup. Pt wants to let MMM know that today she is going to surgeon office to due to hernia that she has.  Pt says that she is in pain from low magnesium. To please help her.

## 2022-12-06 NOTE — Telephone Encounter (Signed)
Referral closed and rejected by hematology. Patient notified. Advised patient to take 81m magnesium BID and stop back by the office in 2 weeks to recheck labs. [facility] placed

## 2022-12-07 ENCOUNTER — Encounter: Payer: Self-pay | Admitting: *Deleted

## 2022-12-07 ENCOUNTER — Telehealth: Payer: Self-pay | Admitting: *Deleted

## 2022-12-07 NOTE — Telephone Encounter (Signed)
Pt will be seeing Ermalinda Barrios, PA on Monday, February 26 for pre-op appt. Will remove from pre op pool.

## 2022-12-07 NOTE — Telephone Encounter (Signed)
Primary Cardiologist:Jonathan Gwenlyn Found, MD  Chart reviewed as part of pre-operative protocol coverage. Because of Brenda Cruz's past medical history and time since last visit, he/she will require a follow-up visit in order to better assess preoperative cardiovascular risk.  Pre-op covering staff: - Please schedule appointment and call patient to inform them. Patient has not been seen since 2020. - Please contact requesting surgeon's office via preferred method (i.e, phone, fax) to inform them of need for appointment prior to surgery.  Pending no symptoms of ACS at time of office visit and per office protocol, he may hold aspirin for 5-7 days prior to procedure and should resume as soon as hemodynamically stable postoperatively.    Emmaline Life, NP-C  12/07/2022, 11:23 AM [address], [address] Office [phone] Fax [phone]

## 2022-12-07 NOTE — Progress Notes (Signed)
[facility] History and Physical  Reason for Referral: Ventral hernia Referring Physician: ED referral  Chief Complaint   New Patient (Initial Visit)     Brenda Cruz is a 57 y.o. female.  HPI: Patient presents for evaluation of a ventral hernia.  It has been present for over a year and she denies ever having significant issues previously.  It was always able to be reduced.  2 weeks ago, she had worsening pain at her hernia site with episodes of nausea and vomiting.  She was also complaining of 'twisting cramps' in the sides of her abdomen.  While in the ED, she underwent a CT abdomen and pelvis which demonstrated a small fat-containing midline ventral hernia just superior to the umbilicus.  She was discharged home from the hospital at that time.  She was noted to be hypomagnesemic, and is currently receiving magnesium supplementation.  She did require previous magnesium infusions when she was being treated for breast cancer.  She believes her abdominal cramps are likely secondary to her low magnesium levels.  Her past medical history significant for hypertension, hyperlipidemia, diabetes, HIT, coronary artery disease status post CABG 5 years ago, and breast cancer.  Her surgical history significant for CABG 5 years ago, cholecystectomy, oophorectomy and salpingectomy, and left breast lumpectomy.  She denies use of blood thinning medications.  She has not seen a cardiologist in greater than 1 year.  She smokes 1 pack of cigarettes per day.  She denies use of alcohol and illicit drugs.  Past Medical History:  Diagnosis Date   Anxiety    Asthma    DDD (degenerative disc disease)    GERD (gastroesophageal reflux disease)    Hemorrhoids    HIT (heparin-induced thrombocytopenia) (HCC)    Hyperlipemia    Hypertension    Infiltrating ductal carcinoma of left breast ([place]) 12/15/2017   Iron deficiency anemia 11/23/2015   Pulmonary emboli ([place]) 12/07/2015   [facility]  spotted fever    TMJ (temporomandibular joint disorder)    Type 2 diabetes mellitus ([place]) 12/15/2017    Past Surgical History:  Procedure Laterality Date   CARDIAC CATHETERIZATION N/A 10/29/2015   Procedure: Left Heart Cath and Coronary Angiography;  Surgeon: [name], MD;  Location: [place] [facility];  Service: Cardiovascular;  Laterality: N/A;   CHOLECYSTECTOMY     CORONARY ARTERY BYPASS GRAFT N/A 11/02/2015   Procedure: CORONARY ARTERY BYPASS GRAFTING (CABG)x 1 using left internal mammary artery.;  Surgeon: Melrose Nakayama, MD;  Location: [place];  Service: Open Heart Surgery;  Laterality: N/A;   LEFT OOPHORECTOMY Left    Related to tubal torsion.  Bilateral salpingectomy also.  Remaining uterus and right ovary   LUMBAR FUSION     TEE WITHOUT CARDIOVERSION N/A 11/02/2015   Procedure: TRANSESOPHAGEAL ECHOCARDIOGRAM (TEE);  Surgeon: Melrose Nakayama, MD;  Location: [place];  Service: Open Heart Surgery;  Laterality: N/A;    Family History  Problem Relation Age of Onset   Colon polyps Father    Stomach cancer Paternal Grandmother    Esophageal cancer Maternal Grandfather    Breast cancer Mother    Ovarian cancer Other        Maternal Side Great  Aunt   Colon cancer Neg Hx     Social History   Tobacco Use   Smoking status: Former   Smokeless tobacco: Never   Tobacco comments:    Counseling paper for smoking given to patient in exam room  Vaping Use   Vaping Use: Never used  Substance Use Topics   Alcohol use: No    Alcohol/week: 0.0 standard drinks of alcohol   Drug use: No    Medications: I have reviewed the patient's current medications. 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   Sulfa Antibiotics Itching, Rash, Other (See Comments)        Medication List        Accurate as of December 06, 2022 11:59 PM. If you have any questions, ask your nurse or doctor.          acetaminophen 500 MG tablet Commonly known as: TYLENOL Take 1,000 mg by mouth every 6 (six) hours as needed for mild pain.   albuterol 108 (90 Base) MCG/ACT inhaler Commonly known as: VENTOLIN HFA Inhale 2 puffs into the lungs every 4 (four) hours as needed for wheezing or shortness of breath.   amLODipine 5 MG tablet Commonly known as: NORVASC Take 1 tablet (5 mg total) by mouth daily.   aspirin EC 81 MG tablet Take 81 mg by mouth every evening.   blood glucose meter kit and supplies Dispense based on patient and insurance preference. Use up to four times daily as directed. (FOR ICD-10 E10.9, E11.9).   budesonide-formoterol 80-4.5 MCG/ACT inhaler Commonly known as: SYMBICORT Inhale 2 puffs into the lungs 2 (two) times daily.   calcium carbonate 1500 (600 Ca) MG Tabs tablet Commonly known as: OSCAL Take 600 mg of elemental calcium by mouth daily with breakfast.   cyclobenzaprine 10 MG tablet Commonly known as: FLEXERIL TAKE ONE TABLET BY MOUTH THREE TIMES DAILY AS NEEDED FOR MUSCLE SPASM   diphenhydrAMINE 25 MG tablet Commonly known as: BENADRYL Take 25 mg by mouth every 6  (six) hours as needed for itching or allergies.   esomeprazole 40 MG capsule Commonly known as: NEXIUM Take 1 capsule (40 mg total) by mouth daily.   FLUoxetine 40 MG capsule Commonly known as: PROZAC Take 2 capsules (80 mg total) by mouth daily. TAKE ONE (1) CAPSULE EACH DAY   fluticasone 50 MCG/ACT nasal spray Commonly known as: FLONASE Place 2 sprays into both nostrils daily.   HYDROcodone-acetaminophen 5-325 MG tablet Commonly known as: NORCO/VICODIN Take 1 tablet by mouth every 4 (four) hours as needed.   ipratropium-albuterol 0.5-2.5 (3) MG/3ML Soln Commonly known as: DUONEB Take 3 mLs by nebulization every 6 (six) hours as needed (Foir shortness of breath).   LORazepam 0.5 MG tablet Commonly known as: ATIVAN Take 1 tablet (0.5 mg total) by mouth every 8 (eight) hours as needed for  anxiety.   magnesium oxide 400 MG tablet Commonly known as: MAG-OX Take 1 tablet (400 mg total) by mouth daily. What changed:  how much to take when to take this   metFORMIN 1000 MG tablet Commonly known as: GLUCOPHAGE Take 1 tablet (1,000 mg total) by mouth 2 (two) times daily.   metoprolol tartrate 50 MG tablet Commonly known as: LOPRESSOR Take 1 tablet (50 mg total) by mouth 2 (two) times daily.   multivitamin with minerals Tabs tablet Take 1 tablet by mouth daily.   nitroGLYCERIN 0.4 MG SL tablet Commonly known as: NITROSTAT Place 1 tablet (0.4 mg total) under the tongue every 5 (five) minutes as needed for chest pain.   ondansetron 8 MG disintegrating tablet Commonly known as: ZOFRAN-ODT TAKE 1 TABLET BY MOUTH EVERY 8 HOURS AS NEEDED FOR NAUSEA & VOMITING   ondansetron 4 MG disintegrating tablet Commonly known as: ZOFRAN-ODT Take 1 tablet (4 mg total) by mouth every 8 (eight) hours as needed.   Rybelsus 3 MG Tabs Generic drug: Semaglutide Take 1 tablet (3 mg total) by mouth daily.         ROS:  Constitutional: negative for chills, fatigue, and fevers Eyes:  negative for visual disturbance and pain Ears, nose, mouth, throat, and face: positive for sinus problems, negative for ear drainage and sore throat Respiratory: positive for cough, negative for wheezing and shortness of breath Cardiovascular: positive for chest pain Gastrointestinal: positive for abdominal pain, nausea, and reflux symptoms Genitourinary:negative for dysuria and frequency Integument/breast: positive for dryness, negative for rash Hematologic/lymphatic: negative for bleeding and lymphadenopathy Musculoskeletal:positive for back pain and neck pain Neurological: negative for dizziness and tremors Endocrine: negative for temperature intolerance  Blood pressure (!) 148/88, pulse 77, temperature 98.1 F (36.7 C), temperature source Oral, resp. rate 14, height 5' 7"$  (1.702 m), weight 209 lb (94.8 kg), last menstrual period 06/03/2017, SpO2 99 %. Physical Exam Vitals reviewed.  Constitutional:      Appearance: Normal appearance.  HENT:     Head: Normocephalic and atraumatic.  Eyes:     Extraocular Movements: Extraocular movements intact.     Pupils: Pupils are equal, round, and reactive to light.  Cardiovascular:     Rate and Rhythm: Normal rate and regular rhythm.  Pulmonary:     Effort: Pulmonary effort is normal.     Breath sounds: Normal breath sounds.  Abdominal:     Comments: Abdomen soft, nondistended, no percussion tenderness, mild tenderness to palpation of the midline abdomen; no rigidity, guarding, rebound tenderness; unable to palpate a discrete hernia defect, but fullness noted in the midline, superior to the umbilicus  Musculoskeletal:        General: Normal range of motion.     Cervical back: Normal range of motion.  Skin:    General: Skin is warm and dry.  Neurological:     General: No focal deficit present.     Mental Status: She is alert and oriented to person, place, and time.  Psychiatric:        Mood and Affect: Mood normal.        Behavior:  Behavior normal.     Results: CT abdomen and pelvis (11/27/2022): IMPRESSION: 1. Small fat containing midline ventral abdominal wall hernia just superior to the umbilicus. 2. Otherwise, no acute abnormality. 3.  Aortic Atherosclerosis (ICD10-I70.0).   Assessment & Plan:  JAMIERA PHI is a 57 y.o. female who presents for evaluation of ventral hernia.  Imaging and blood work evaluated by  myself  -I explained the pathophysiology of hernias, and why we recommend surgical repair -The risk and benefits of robotic assisted laparoscopic ventral hernia repair with mesh were discussed including but not limited to bleeding, infection, injury to surrounding structures, need for additional procedures, and hernia recurrence.  After careful consideration, MARNIE PLOTT has decided to proceed with surgery. -Prior to surgery, I advised that the patient needs to follow-up with her cardiologist for cardiac clearance given her significant cardiovascular history -Patient is trying to establish care with hematology to assist with her magnesium levels -Information provided to the patient regarding ventral hernias.  Advised her to present to the emergency department if she begins to have worsening pain at the hernia, nausea, vomiting, and obstipation  All questions were answered to the satisfaction of the patient.  Graciella Freer, DO [facility] [address] [place], [place] [address] [phone] (office)

## 2022-12-07 NOTE — Telephone Encounter (Signed)
   Pre-operative Risk Assessment    Patient Name: Brenda Cruz  DOB: 03-Feb-1966 MRN: [id number]      Request for Surgical Clearance    Procedure:   XI Robotic Laparoscopic Ventral Hernia Repair Erasmo Downer  Date of Surgery:  Clearance TBD                                 Surgeon:  Dr. Graciella Freer Surgeon's Group or Practice Name:  [facility] Phone number:  [phone] Fax number:  [phone]   Type of Clearance Requested:   - Medical  - Pharmacy:  Hold Aspirin Not Indicated   Type of Anesthesia:  General    Additional requests/questions:    Signed, Greer Ee   12/07/2022, 11:05 AM

## 2022-12-08 NOTE — Progress Notes (Signed)
Cardiology Office Note:    Date:  12/12/2022   ID:  Brenda Cruz, DOB [birth year], MRN [id number]  PCP:  [name], [place] [facility] Cardiologist:  Quay Burow, MD Cardiology APP:  Ahmed Prima Felisa Bonier     Referring MD: [name], [name], *   Chief Complaint:  Chest Pain, Follow-up, Shortness of Breath, and Pre-op Exam     History of Present Illness:   Brenda Cruz is a 57 y.o. 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  Last saw Dr. Gwenlyn Found 2020 and asked to stop smoking.  Patient on my schedule for Preop clearance for XI Robotic Laparoscopic Ventral Hernia Repair W/Mesh Dr. Graciella Freer [facility]. Patient has been having chest tightness and shortness of breath for a couple months with little activity-walking in house. BP has been high and started on amlodipine. BP was 196/101 before she took her amlodipine.  Magnesium has been low. LDL 179 TC 294, trig 394. Has been off meds for > 23yr Trouble with lipitor and zocor-body aches/leg pain.  Smoking 1 ppd.  Had breast cancer 1 yr after her CABG.                Past Medical History:  Diagnosis Date   Anxiety    Asthma    DDD (degenerative disc disease)    GERD (gastroesophageal reflux disease)    Hemorrhoids    HIT (heparin-induced thrombocytopenia) (HCC)    Hyperlipemia    Hypertension    Infil[place] 12/15/2017   Iron deficiency anemia 11/23/2015   [place]oli [place] 12/07/2015   [place] spotted fever    TMJ (temporomandibular joint disorder)    Type 2 diabetes mellitus ([place] 12/15/2017   Current Medications: Current Meds  Medication Sig   acetaminophen (TYLENOL) 500 MG tablet Take 1,000 mg by mouth every 6 (six) hours as needed for mild pain.   albuterol (VENTOLIN HFA)  108 (90 Base) MCG/ACT inhaler Inhale 2 puffs into the lungs every 4 (four) hours as needed for wheezing or shortness of breath.   amLODipine (NORVASC) 5 MG tablet Take 1 tablet (5 mg total) by mouth daily.   aspirin EC 81 MG tablet Take 81 mg by mouth every evening.    blood glucose meter kit and supplies Dispense based on patient and insurance preference. Use up to four times daily as directed. (FOR ICD-10 E10.9, E11.9).   budesonide-formoterol (SYMBICORT) 80-4.5 MCG/ACT inhaler Inhale 2 puffs into the lungs 2 (two) times daily.   calcium carbonate (OSCAL) 1500 (600 Ca) MG TABS tablet Take 600 mg of elemental calcium by mouth daily with breakfast.   cyclobenzaprine (FLEXERIL) 10 MG tablet TAKE ONE TABLET BY MOUTH THREE TIMES DAILY AS NEEDED FOR MUSCLE SPASM   diphenhydrAMINE (BENADRYL) 25 MG tablet Take 25 mg by mouth every 6 (six) hours as needed for itching or allergies.   esomeprazole (NEXIUM) 40 MG capsule Take 1 capsule (40 mg total) by mouth daily.   FLUoxetine (PROZAC) 40 MG capsule Take 2 capsules (80 mg total) by mouth daily. TAKE ONE (1) CAPSULE EACH DAY   fluticasone (FLONASE) 50 MCG/ACT nasal spray Place 2 sprays into both nostrils daily.   HYDROcodone-acetaminophen (NORCO/VICODIN) 5-325 MG tablet Take 1 tablet by mouth every 4 (four) hours as needed.   ipratropium-albuterol (  DUONEB) 0.5-2.5 (3) MG/3ML SOLN Take 3 mLs by nebulization every 6 (six) hours as needed (Foir shortness of breath).   isosorbide mononitrate (IMDUR) 30 MG 24 hr tablet Take 1 tablet (30 mg total) by mouth daily.   LORazepam (ATIVAN) 0.5 MG tablet Take 1 tablet (0.5 mg total) by mouth every 8 (eight) hours as needed for anxiety.   magnesium oxide (MAG-OX) 400 MG tablet Take 1 tablet (400 mg total) by mouth daily. (Patient taking differently: Take 800 mg by mouth 2 (two) times daily.)   metFORMIN (GLUCOPHAGE) 1000 MG tablet Take 1 tablet (1,000 mg total) by mouth 2 (two) times daily.   metoprolol tartrate  (LOPRESSOR) 50 MG tablet Take 1 tablet (50 mg total) by mouth 2 (two) times daily.   ondansetron (ZOFRAN-ODT) 4 MG disintegrating tablet Take 1 tablet (4 mg total) by mouth every 8 (eight) hours as needed.   prochlorperazine (COMPAZINE) 10 MG tablet Take by mouth.   Semaglutide (RYBELSUS) 3 MG TABS Take 1 tablet (3 mg total) by mouth daily.   [DISCONTINUED] ondansetron (ZOFRAN-ODT) 8 MG disintegrating tablet TAKE 1 TABLET BY MOUTH EVERY 8 HOURS AS NEEDED FOR NAUSEA & VOMITING    Allergies:   Atorvastatin, Iohexol, Ace inhibitors, Farxiga [dapagliflozin], Heparin, Iodinated contrast media, Penicillins, and Sulfa antibiotics   Social History   Tobacco Use   Smoking status: Former   Smokeless tobacco: Never   Tobacco comments:    Counseling paper for smoking given to patient in exam room   Vaping Use   Vaping Use: Never used  Substance Use Topics   Alcohol use: No    Alcohol/week: 0.0 standard drinks of alcohol   Drug use: No    Family Hx: The patient's family history includes Breast cancer in her mother; Colon polyps in her father; Esophageal cancer in her maternal grandfather; Ovarian cancer in an other family member; Stomach cancer in her paternal grandmother. There is no history of Colon cancer.  ROS     Physical Exam:    VS:  BP 130/80   Pulse 69   Ht '5\' 8"'$  (1.727 m)   Wt 209 lb 9.6 oz (95.1 kg)   LMP 06/03/2017 (Exact Date)   SpO2 99%   BMI 31.87 kg/m     Wt Readings from Last 3 Encounters:  12/12/22 209 lb 9.6 oz (95.1 kg)  12/06/22 209 lb (94.8 kg)  11/29/22 206 lb (93.4 kg)    Physical Exam  GEN: Obese, in no acute distress  Neck: no JVD, carotid bruits, or masses Cardiac:RRR; no murmurs, rubs, or gallops  Respiratory:  clear to auscultation bilaterally, normal work of breathing GI: soft, nontender, nondistended, + BS Ext: without cyanosis, clubbing, or edema, Good distal pulses bilaterally Neuro:  Alert and Oriented x 3,  Psych: euthymic mood, full affect         EKGs/Labs/Other Test Reviewed:    EKG:  EKG is   ordered today.  The ekg ordered today demonstrates NSR nonspecific ST changes  Recent Labs: 11/29/2022: ALT 46; BUN 10; Creatinine, Ser 1.19; Hemoglobin 13.6; Magnesium 1.4; Platelets 227; Potassium 4.9; Sodium 138; TSH 1.920   Recent Lipid Panel Recent Labs    11/29/22 0918  CHOL 294*  TRIG 394*  HDL 37*  LDLCALC 179*     Prior CV Studies:     NST 2020 Nuclear stress EF: 64%. The left ventricular ejection fraction is normal (55-65%). There was no ST segment deviation noted during stress. Defect 1: There is a  small defect of mild severity present in the mid anterior location. The study is normal. This is a low risk study.   Normal stress nuclear study with mild soft tissue attenuation; no ischemia; EF 64 with normal wall motion.  Risk Assessment/Calculations/Metrics:              ASSESSMENT & PLAN:   No problem-specific Assessment & Plan notes found for this encounter.   Preop clearance for XI Robotic Laparoscopic Ventral Hernia Repair W/Mesh Dr. Barnetta Chapel Pappayliou  [facility]. Patient with daily chest pain and DOE so needs w/u before clearing her for surgery.  CAD S/P CABG LIMA to LAD 2017 for  L main disease complicated by DVT and PE 1 week later, NST normal 06/2019 now with regular chest pain, DOE, uncontrolled lipids, DM and continues to smoke. Lifestyle modification discussed in detail. Add Imdur 30 mg daily, Lexiscan myoview.  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 Better this am. Add Imdur 30 mg daily  HLD TC 296, LDL 179, trig 394. Body aches with lipitor and zocor. Refer to lipid clinic  Morbid obesity-trying to lose weight  Tobacco abuse continues to smoke 1 ppd. Smoking cessation discussed  DM2 A1C 9.0-PCP managing  Family history of early CAD       Shared Decision Making/Informed Consent The risks  [chest pain, shortness of breath, cardiac arrhythmias, dizziness, blood pressure fluctuations, myocardial infarction, stroke/transient ischemic attack, nausea, vomiting, allergic reaction, radiation exposure, metallic taste sensation and life-threatening complications (estimated to be 1 in 10,000)], benefits (risk stratification, diagnosing coronary artery disease, treatment guidance) and alternatives of a nuclear stress test were discussed in detail with Brenda Cruz and she agrees to proceed.   Dispo:  Return in about 2 weeks (around 12/26/2022) for Close follow up in 2 weeks with Ermalinda Barrios, PA.   Medication Adjustments/Labs and Tests Ordered: Current medicines are reviewed at length with the patient today.  Concerns regarding medicines are outlined above.  Tests Ordered: Orders Placed This Encounter  Procedures   AMB Referral to [facility] Pharm-D   Cardiac Stress Test: Informed Consent Details: Physician/Practitioner Attestation; Transcribe to consent form and obtain patient signature   Myocardial Perfusion Imaging   EKG 12-Lead   Medication Changes: Meds ordered this encounter  Medications   isosorbide mononitrate (IMDUR) 30 MG 24 hr tablet    Sig: Take 1 tablet (30 mg total) by mouth daily.    Dispense:  30 tablet    Refill:  6   Signed, Ermalinda Barrios, PA-C  12/12/2022 10:06 AM    [facility] [place], [place], [place]  [address] Phone: [phone]; Fax: [phone]

## 2022-12-12 ENCOUNTER — Ambulatory Visit: Payer: Managed Care, Other (non HMO) | Attending: Physician Assistant | Admitting: Physician Assistant

## 2022-12-12 ENCOUNTER — Encounter: Payer: Self-pay | Admitting: Physician Assistant

## 2022-12-12 VITALS — BP 130/80 | HR 69 | Ht 68.0 in | Wt 209.6 lb

## 2022-12-12 DIAGNOSIS — Z01818 Encounter for other preprocedural examination: Secondary | ICD-10-CM | POA: Diagnosis not present

## 2022-12-12 DIAGNOSIS — I10 Essential (primary) hypertension: Secondary | ICD-10-CM

## 2022-12-12 DIAGNOSIS — E785 Hyperlipidemia, unspecified: Secondary | ICD-10-CM

## 2022-12-12 DIAGNOSIS — I25119 Atherosclerotic heart disease of native coronary artery with unspecified angina pectoris: Secondary | ICD-10-CM

## 2022-12-12 DIAGNOSIS — I2089 Other forms of angina pectoris: Secondary | ICD-10-CM

## 2022-12-12 DIAGNOSIS — Z720 Tobacco use: Secondary | ICD-10-CM

## 2022-12-12 DIAGNOSIS — Z86711 Personal history of pulmonary embolism: Secondary | ICD-10-CM | POA: Diagnosis not present

## 2022-12-12 DIAGNOSIS — Z8249 Family history of ischemic heart disease and other diseases of the circulatory system: Secondary | ICD-10-CM

## 2022-12-12 MED ORDER — ISOSORBIDE MONONITRATE ER 30 MG PO TB24: 30.0000 mg | ORAL_TABLET | Freq: Every day | ORAL | 6 refills | Status: DC

## 2022-12-12 NOTE — Patient Instructions (Signed)
Medication Instructions:   START Imdur one(1) tablet by mouth (30 mg) daily.  START with one half (0.5) tablet by mouth ( 15 mg) X 3 days.   *If you need a refill on your cardiac medications before your next appointment, please call your pharmacy*   Lab Work:  None ordered.  If you have labs (blood work) drawn today and your tests are completely normal, you will receive your results only by: [place] (if you have MyChart) OR A paper copy in the mail If you have any lab test that is abnormal or we need to change your treatment, we will call you to review the results.   Testing/Procedures:  You are scheduled for a Myocardial Perfusion Imaging Study on Tuesday, February 27 at 7:15 am.   Please arrive 15 minutes prior to your appointment time for registration and insurance purposes.   The test will take approximately 3 to 4 hours to complete; you may bring reading material. If someone comes with you to your appointment, they will need to remain in the main lobby due to limited space in the testing area.   How to prepare for your Myocardial Perfusion test:   Do not eat or drink 3 hours prior to your test, except you may have water.    Do not consume products containing caffeine (regular or decaffeinated) 12 hours prior to your test (ex: coffee, chocolate, soda, tea)   Do bring a list of your current medications with you. If not listed below, you may take your medications as normal.   Bring any held medication to your appointment, as you may be required to take it once the test is complete.   Do wear comfortable clothes (no dresses) and walking shoes. Tennis shoes are preferred. No heels or open toed shoes. Do not wear perfume, aftershave or lotions (deodorant is allowed).   If these instructions are not followed, you test will have to be rescheduled.   Please report to [address] for your test. If you have questions or concerns about your appointment,  please call the Nuclear Lab at [phone].  If you cannot keep your appointment, please provide 24 hour notification to the Nuclear lab to avoid a possible $50 charge to your account.       Follow-Up: At [facility], you and your health needs are our priority.  As part of our continuing mission to provide you with exceptional heart care, we have created designated Provider Care Teams.  These Care Teams include your primary Cardiologist (physician) and Advanced Practice Providers (APPs -  Physician Assistants and Nurse Practitioners) who all work together to provide you with the care you need, when you need it.  We recommend signing up for the patient portal called "MyChart".  Sign up information is provided on this After Visit Summary.  MyChart is used to connect with patients for Virtual Visits (Telemedicine).  Patients are able to view lab/test results, encounter notes, upcoming appointments, etc.  Non-urgent messages can be sent to your provider as well.   To learn more about what you can do with MyChart, go to [email or web address].    Your next appointment:   2 week(s)  Provider:   Ermalinda Barrios, PA-C     Then, Quay Burow, MD will plan to see you again in 4 month(s).    Other Instructions  You have been referred to the lipid clinic to speak with the Pharmacy Team.    Managing the Challenge  of Quitting Smoking Quitting smoking is a physical and mental challenge. You may have cravings, withdrawal symptoms, and temptation to smoke. Before quitting, work with your health care provider to make a plan that can help you manage quitting. Making a plan before you quit may keep you from smoking when you have the urge to smoke while trying to quit. How to manage lifestyle changes Managing stress Stress can make you want to smoke, and wanting to smoke may cause stress. It is important to find ways to manage your stress. You could try some of the following: Practice relaxation  techniques. Breathe slowly and deeply, in through your nose and out through your mouth. Listen to music. Soak in a bath or take a shower. Imagine a peaceful place or vacation. Get some support. Talk with family or friends about your stress. Join a support group. Talk with a counselor or therapist. Get some physical activity. Go for a walk, run, or bike ride. Play a favorite sport. Practice yoga.  Medicines Talk with your health care provider about medicines that might help you deal with cravings and make quitting easier for you. Relationships Social situations can be difficult when you are quitting smoking. To manage this, you can: Avoid parties and other social situations where people might be smoking. Avoid alcohol. Leave right away if you have the urge to smoke. Explain to your family and friends that you are quitting smoking. Ask for support and let them know you might be a bit grumpy. Plan activities where smoking is not an option. General instructions Be aware that many people gain weight after they quit smoking. However, not everyone does. To keep from gaining weight, have a plan in place before you quit, and stick to the plan after you quit. Your plan should include: Eating healthy snacks. When you have a craving, it may help to: Eat popcorn, or try carrots, celery, or other cut vegetables. Chew sugar-free gum. Changing how you eat. Eat small portion sizes at meals. Eat 4-6 small meals throughout the day instead of 1-2 large meals a day. Be mindful when you eat. You should avoid watching television or doing other things that might distract you as you eat. Exercising regularly. Make time to exercise each day. If you do not have time for a long workout, do short bouts of exercise for 5-10 minutes several times a day. Do some form of strengthening exercise, such as weight lifting. Do some exercise that gets your heart beating and causes you to breathe deeply, such as walking  fast, running, swimming, or biking. This is very important. Drinking plenty of water or other low-calorie or no-calorie drinks. Drink enough fluid to keep your urine pale yellow.  How to recognize withdrawal symptoms Your body and mind may experience discomfort as you try to get used to not having nicotine in your system. These effects are called withdrawal symptoms. They may include: Feeling hungrier than normal. Having trouble concentrating. Feeling irritable or restless. Having trouble sleeping. Feeling depressed. Craving a cigarette. These symptoms may surprise you, but they are normal to have when quitting smoking. To manage withdrawal symptoms: Avoid places, people, and activities that trigger your cravings. Remember why you want to quit. Get plenty of sleep. Avoid coffee and other drinks that contain caffeine. These may worsen some of your symptoms. How to manage cravings Come up with a plan for how to deal with your cravings. The plan should include the following: A definition of the specific situation you want  to deal with. An activity or action you will take to replace smoking. A clear idea for how this action will help. The name of someone who could help you with this. Cravings usually last for 5-10 minutes. Consider taking the following actions to help you with your plan to deal with cravings: Keep your mouth busy. Chew sugar-free gum. Suck on hard candies or a straw. Brush your teeth. Keep your hands and body busy. Change to a different activity right away. Squeeze or play with a ball. Do an activity or a hobby, such as making bead jewelry, practicing needlepoint, or working with wood. Mix up your normal routine. Take a short exercise break. Go for a quick walk, or run up and down stairs. Focus on doing something kind or helpful for someone else. Call a friend or family member to talk during a craving. Join a support group. Contact a quitline. Where to find  support To get help or find a support group: Call the [facility]'s Smoking Quitline: 1-800-QUIT-NOW 704-253-0967) Text QUIT to SmokefreeTXTMQ:317211 Where to find more information Visit these websites to find more information on quitting smoking: U.S. Department of Health and Human Services: www.smokefree.gov American Lung Association: www.freedomfromsmoking.org Centers for Disease Control and Prevention (CDC): http://www.wolf.info/ American Heart Association: www.heart.org Contact a health care provider if: You want to change your plan for quitting. The medicines you are taking are not helping. Your eating feels out of control or you cannot sleep. You feel depressed or become very anxious. Summary Quitting smoking is a physical and mental challenge. You will face cravings, withdrawal symptoms, and temptation to smoke again. Preparation can help you as you go through these challenges. Try different techniques to manage stress, handle social situations, and prevent weight gain. You can deal with cravings by keeping your mouth busy (such as by chewing gum), keeping your hands and body busy, calling family or friends, or contacting a quitline for people who want to quit smoking. You can deal with withdrawal symptoms by avoiding places where people smoke, getting plenty of rest, and avoiding drinks that contain caffeine. This information is not intended to replace advice given to you by your health care provider. Make sure you discuss any questions you have with your health care provider. Document Revised: 09/24/2021 Document Reviewed: 09/24/2021 Elsevier Patient Education  [place].

## 2022-12-13 ENCOUNTER — Ambulatory Visit (HOSPITAL_COMMUNITY): Payer: Managed Care, Other (non HMO) | Attending: Cardiology

## 2022-12-13 DIAGNOSIS — Z01818 Encounter for other preprocedural examination: Secondary | ICD-10-CM | POA: Insufficient documentation

## 2022-12-13 DIAGNOSIS — Z720 Tobacco use: Secondary | ICD-10-CM | POA: Diagnosis present

## 2022-12-13 DIAGNOSIS — Z8249 Family history of ischemic heart disease and other diseases of the circulatory system: Secondary | ICD-10-CM | POA: Diagnosis present

## 2022-12-13 DIAGNOSIS — I10 Essential (primary) hypertension: Secondary | ICD-10-CM | POA: Diagnosis present

## 2022-12-13 DIAGNOSIS — I2089 Other forms of angina pectoris: Secondary | ICD-10-CM | POA: Insufficient documentation

## 2022-12-13 DIAGNOSIS — Z86711 Personal history of pulmonary embolism: Secondary | ICD-10-CM | POA: Insufficient documentation

## 2022-12-13 DIAGNOSIS — E785 Hyperlipidemia, unspecified: Secondary | ICD-10-CM | POA: Diagnosis present

## 2022-12-13 DIAGNOSIS — I25119 Atherosclerotic heart disease of native coronary artery with unspecified angina pectoris: Secondary | ICD-10-CM | POA: Insufficient documentation

## 2022-12-13 LAB — MYOCARDIAL PERFUSION IMAGING
LV dias vol: 77 mL (ref 46–106)
LV sys vol: 29 mL
Nuc Stress EF: 62 %
Peak HR: 94 {beats}/min
Rest HR: 63 {beats}/min
Rest Nuclear Isotope Dose: 10.2 mCi
SDS: 0
SRS: 0
SSS: 0
ST Depression (mm): 0 mm
Stress Nuclear Isotope Dose: 32.3 mCi
TID: 1.02

## 2022-12-13 MED ORDER — AMINOPHYLLINE 25 MG/ML IV SOLN
75.0000 mg | Freq: Once | INTRAVENOUS | Status: AC
  Administered 2022-12-13: 75 mg via INTRAVENOUS

## 2022-12-13 MED ORDER — REGADENOSON 0.4 MG/5ML IV SOLN
0.4000 mg | Freq: Once | INTRAVENOUS | Status: AC
  Administered 2022-12-13: 0.4 mg via INTRAVENOUS

## 2022-12-13 MED ORDER — TECHNETIUM TC 99M TETROFOSMIN IV KIT
10.2000 | PACK | Freq: Once | INTRAVENOUS | Status: AC | PRN
  Administered 2022-12-13: 10.2 via INTRAVENOUS

## 2022-12-13 MED ORDER — TECHNETIUM TC 99M TETROFOSMIN IV KIT
32.3000 | PACK | Freq: Once | INTRAVENOUS | Status: AC | PRN
  Administered 2022-12-13: 32.3 via INTRAVENOUS

## 2022-12-13 NOTE — Progress Notes (Signed)
Cardiology Office Note:    Date:  12/20/2022   ID:  Brenda Cruz, DOB [birth year], MRN [id number]  PCP:  [name], [place] [facility] Cardiologist:  Quay Burow, MD Cardiology APP:  Reola Mosher     Referring MD: [name], *   Chief Complaint:  No chief complaint on file.     History of Present Illness:   Brenda Cruz is a 57 y.o. 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   Last saw Dr. Gwenlyn Found 2020 and asked to stop smoking.  I saw her 12/12/22 for Preop clearance for XI Robotic Laparoscopic Ventral Hernia Repair W/Mesh Dr. Graciella Freer [facility]. Patient has been having chest tightness and shortness of breath for a couple months with little activity-walking in house. BP has been high and started on amlodipine. BP was 196/101 before she took her amlodipine.  Magnesium has been low. LDL 179 TC 294, trig 394. Has been off meds for > 55yr Trouble with lipitor and zocor-body aches/leg pain.  Smoking 1 ppd.  Had breast cancer 1 yr after her CABG.  Patient comes in for f/u. Started taking her meds regularly and BP controlled. Not taking Imdur. Still having chest pressure at sporadic times-she thinks it's stress or GI. Has fullness in her abdomen. Didn't get ozempic approved yet.             Past Medical History:  Diagnosis Date   Anxiety    Asthma    DDD (degenerative disc disease)    GERD (gastroesophageal reflux disease)    Hemorrhoids    HIT (heparin-induced thrombocytopenia) (HCC)    Hyperlipemia    Hypertension    Infiltrating ductal carcinoma of left breast ([facility] 12/15/2017   Iron deficiency anemia 11/23/2015   Pulmonary emboli ([facility] 12/07/2015   [place] spotted fever    TMJ (temporomandibular joint disorder)    Type 2 diabetes mellitus ([facility] 12/15/2017    Current Medications: Current Meds  Medication Sig   acetaminophen (TYLENOL) 500 MG tablet Take 1,000 mg by mouth every 6 (six) hours as needed for mild pain.   albuterol (VENTOLIN HFA) 108 (90 Base) MCG/ACT inhaler Inhale 2 puffs into the lungs every 4 (four) hours as needed for wheezing or shortness of breath.   amLODipine (NORVASC) 5 MG tablet Take 1 tablet (5 mg total) by mouth daily.   aspirin EC 81 MG tablet Take 81 mg by mouth every evening.    blood glucose meter kit and supplies Dispense based on patient and insurance preference. Use up to four times daily as directed. (FOR ICD-10 E10.9, E11.9).   budesonide-formoterol (SYMBICORT) 80-4.5 MCG/ACT inhaler Inhale 2 puffs into the lungs 2 (two) times daily.   calcium carbonate (OSCAL) 1500 (600 Ca) MG TABS tablet Take 600 mg of elemental calcium by mouth daily with breakfast.   cyclobenzaprine (FLEXERIL) 10 MG tablet TAKE ONE TABLET BY MOUTH THREE TIMES DAILY AS NEEDED FOR MUSCLE SPASM   diphenhydrAMINE (BENADRYL) 25 MG tablet Take 25 mg by mouth every 6 (six) hours as needed for itching or allergies.   esomeprazole (NEXIUM) 40 MG capsule Take 1 capsule (40 mg total) by mouth daily.   FLUoxetine (PROZAC) 40 MG capsule Take 2 capsules (80 mg total) by mouth daily. TAKE ONE (1) CAPSULE EACH DAY   fluticasone (FLONASE)  50 MCG/ACT nasal spray Place 2 sprays into both nostrils daily.   HYDROcodone-acetaminophen (NORCO/VICODIN) 5-325 MG tablet Take 1 tablet by mouth every 4 (four) hours as needed.   ipratropium-albuterol (DUONEB) 0.5-2.5 (3) MG/3ML SOLN Take 3 mLs by nebulization every 6 (six) hours as needed (Foir shortness of breath).   LORazepam (ATIVAN) 0.5 MG tablet Take 1 tablet (0.5 mg total) by mouth every 8 (eight) hours as needed for anxiety.   magnesium oxide (MAG-OX) 400 MG tablet Take 1 tablet (400 mg total) by mouth daily. (Patient taking differently: Take 800 mg by mouth 2 (two) times daily.)   metFORMIN (GLUCOPHAGE) 1000 MG  tablet Take 1 tablet (1,000 mg total) by mouth 2 (two) times daily.   metoprolol tartrate (LOPRESSOR) 50 MG tablet Take 1 tablet (50 mg total) by mouth 2 (two) times daily.   ondansetron (ZOFRAN-ODT) 4 MG disintegrating tablet Take 1 tablet (4 mg total) by mouth every 8 (eight) hours as needed.   prochlorperazine (COMPAZINE) 10 MG tablet Take by mouth.   rosuvastatin (CRESTOR) 10 MG tablet Take 1 tablet (10 mg total) by mouth daily.   Semaglutide,0.25 or 0.'5MG'$ /DOS, 2 MG/3ML SOPN Inject 0.'25mg'$  once a week for 4 weeks and then increase to 0.'5mg'$  weekly    Allergies:   Atorvastatin, Iohexol, Ace inhibitors, Farxiga [dapagliflozin], Heparin, Iodinated contrast media, Penicillins, and Sulfa antibiotics   Social History   Tobacco Use   Smoking status: Former   Smokeless tobacco: Never   Tobacco comments:    Counseling paper for smoking given to patient in exam room   Vaping Use   Vaping Use: Never used  Substance Use Topics   Alcohol use: No    Alcohol/week: 0.0 standard drinks of alcohol   Drug use: No    Family Hx: The patient's family history includes Breast cancer in her mother; Colon polyps in her father; Esophageal cancer in her maternal grandfather; Ovarian cancer in an other family member; Stomach cancer in her paternal grandmother. There is no history of Colon cancer.  ROS     Physical Exam:    VS:  BP 98/70   Pulse 82   Ht '5\' 8"'$  (1.727 m)   Wt 210 lb 3.2 oz (95.3 kg)   LMP 06/03/2017 (Exact Date)   SpO2 98%   BMI 31.96 kg/m     Wt Readings from Last 3 Encounters:  12/20/22 210 lb 3.2 oz (95.3 kg)  12/13/22 209 lb (94.8 kg)  12/12/22 209 lb 9.6 oz (95.1 kg)    Physical Exam  GEN: Obese, in no acute distress  Neck: no JVD, carotid bruits, or masses Cardiac:RRR; no murmurs, rubs, or gallops  Respiratory:  clear to auscultation bilaterally, normal work of breathing GI: soft, nontender, nondistended, + BS Ext: without cyanosis, clubbing, or edema, Good distal pulses  bilaterally Neuro:  Alert and Oriented x 3,  Psych: euthymic mood, full affect        EKGs/Labs/Other Test Reviewed:    EKG:  EKG is  not ordered today.     Recent Labs: 11/29/2022: ALT 46; BUN 10; Creatinine, Ser 1.19; Hemoglobin 13.6; Magnesium 1.4; Platelets 227; Potassium 4.9; Sodium 138; TSH 1.920   Recent Lipid Panel Recent Labs    11/29/22 0918  CHOL 294*  TRIG 394*  HDL 37*  LDLCALC 179*     Prior CV Studies:     NST 11/2022   The study is normal. The study is low risk.   No ST deviation was noted.  LV perfusion is normal. There is no evidence of ischemia. There is no evidence of infarction.   Left ventricular function is normal. Nuclear stress EF: 62 %. The left ventricular ejection fraction is normal (55-65%). End diastolic cavity size is normal. End systolic cavity size is normal.   Prior study available for comparison from 06/28/2019. NST 2020 Nuclear stress EF: 64%. The left ventricular ejection fraction is normal (55-65%). There was no ST segment deviation noted during stress. Defect 1: There is a small defect of mild severity present in the mid anterior location. The study is normal. This is a low risk study.   Normal stress nuclear study with mild soft tissue attenuation; no ischemia; EF 64 with normal wall motion. Risk Assessment/Calculations/Metrics:              ASSESSMENT & PLAN:   No problem-specific Assessment & Plan notes found for this encounter.   Preop clearance for XI Robotic Laparoscopic Ventral Hernia Repair W/Mesh Dr. Barnetta Chapel [place]ou  [facility]. Patient with daily chest pain and DOE- NST normal without ischemia. She is at reasonable risk to proceed with above surgery without further cardiac w/u.    CAD S/P CABG LIMA to LAD 2017 for  L main disease complicated by DVT and PE 1 week later, NST normal 11/2022 suspect chest pain is noncardiac, DOE due to smoking and deconditioning. uncontrolled lipids, DM and  continues to smoke. Lifestyle modification discussed in detail.     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    HLD TC 296, LDL 179, trig 394. Body aches with lipitor and zocor. Refer to lipid clinic   Morbid obesity-trying to lose weight- pharmacy trying to get ozempic approved.   Tobacco abuse continues to smoke 1 ppd. Smoking cessation discussed   DM2 A1C 9.0-PCP managing   Family history of early CAD                   Dispo:  No follow-ups on file.   Medication Adjustments/Labs and Tests Ordered: Current medicines are reviewed at length with the patient today.  Concerns regarding medicines are outlined above.  Tests Ordered: No orders of the defined types were placed in this encounter.  Medication Changes: No orders of the defined types were placed in this encounter.  Signed, Ermalinda Barrios, PA-C  12/20/2022 2:09 PM    [address], [place], [place]  [address] Phone: [phone]; Fax: [phone]

## 2022-12-14 NOTE — Telephone Encounter (Signed)
We can do [place]- have you tried that and did it make you sick?

## 2022-12-15 ENCOUNTER — Ambulatory Visit: Payer: Managed Care, Other (non HMO) | Attending: Internal Medicine | Admitting: Pharmacist

## 2022-12-15 DIAGNOSIS — E782 Mixed hyperlipidemia: Secondary | ICD-10-CM | POA: Diagnosis not present

## 2022-12-15 DIAGNOSIS — I10 Essential (primary) hypertension: Secondary | ICD-10-CM | POA: Diagnosis not present

## 2022-12-15 DIAGNOSIS — I25119 Atherosclerotic heart disease of native coronary artery with unspecified angina pectoris: Secondary | ICD-10-CM | POA: Diagnosis not present

## 2022-12-15 DIAGNOSIS — E1165 Type 2 diabetes mellitus with hyperglycemia: Secondary | ICD-10-CM | POA: Diagnosis not present

## 2022-12-15 DIAGNOSIS — Z720 Tobacco use: Secondary | ICD-10-CM

## 2022-12-15 MED ORDER — ROSUVASTATIN CALCIUM 10 MG PO TABS: 10.0000 mg | ORAL_TABLET | Freq: Every day | ORAL | 3 refills | Status: DC

## 2022-12-15 MED ORDER — SEMAGLUTIDE(0.25 OR 0.5MG/DOS) 2 MG/3ML ~~LOC~~ SOPN: PEN_INJECTOR | SUBCUTANEOUS | 1 refills | Status: DC

## 2022-12-15 NOTE — Assessment & Plan Note (Addendum)
Assessment: Blood blood sugar uncontrolled.  Last A1c was 9 Currently on Rybelsus 3 mg daily.  Not tolerating well Also on metformin 1000 mg twice a day She consumes a significant amount of sugar from her drinks.  She is aware of this and knows that this needs to change. Discussed the cardiovascular benefits of Ozempic.  Do not have this data with Rybelsus yet. Patient is aware that nausea is a common side effect of both Rybelsus and Ozempic but can be mitigated by eating small meals more frequently  Plan: Patient would like to switch from Rybelsus to Ozempic Stop Rybelsus (patient has not taken in the last couple days) and start Ozempic 0.25 mg weekly for 4 weeks and then increase to 0.5 mg weekly Written titration schedule given to patient Injection technique reviewed along with tips to help improve tolerability I will call patient in 3 weeks to follow-up with how she is doing and we will decide at that point if she titrates up to 0.5 mg Strongly encourage physical activity.  We have set a goal of starting to walk nightly.  Can start with 10 or 15 minutes several times a week and increase both the time and days per week. Eventually I would like her to add in strength training, with the need for abdominal surgery and the many changes that we were making, this will be a goal set later on Snack on foods high in protein and healthy fat instead of purely carbohydrates

## 2022-12-15 NOTE — Assessment & Plan Note (Signed)
Assessment: LDL-C above goal of less than 55 We discussed her risk factors quitting premature ASCVD diabetes hypertension and the need for a very aggressive goal Patient does not feel like she gave rosuvastatin a full shot.  Willing to rechallenge We did discuss PCSK9 inhibitor therapy as well Modifiable risk factors as seen in the rest of the A&P were discussed  Plan: Start rosuvastatin 10 mg daily She will go to [facility] and get labs drawn in April After labs result will determine next move.  Most likely will not be at goal can consider increasing statin or adding PCSK9 inhibitor or ezetimibe depending on what her LDL-C is Increase physical activity

## 2022-12-15 NOTE — Progress Notes (Signed)
Patient ID: Brenda Cruz                 DOB: 1966/06/20                    MRN: [id number]      HPI: Brenda Cruz is a 57 y.o. female patient of Dr. Gwenlyn Found referred to lipid clinic by Brenda Cruz. 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  Patient was seen by Sharyn Lull seeking clearance for abdominal surgery.  Found to not be on any cholesterol medication.  Blood pressure elevated, blood sugar uncontrolled and patient still smoking.  She was referred to the lipid clinic.  Patient presents today to the Pharm.D. clinic.  She is very open to changes.  She knows that her conditions are not well-controlled and that she is at high risk of another cardiovascular event.  Currently on Rybelsus for her blood sugar.  She feels like it is causing changes in taste and smell.  She also has a lot of nausea and no desire to eat.  She was previously on Rybelsus and lost about 20 pounds and her A1c improved significantly.  She drinks 5-20 ounce Mountain Dew's per day.  She generally eats a bag of chips for lunch.  Does not really eat breakfast.  Her husband cooks dinner which is usually a meat vegetable and starch.  She knows that she is consuming too much sugar and calories for her drinks and that she needs to drink more water.  She reports pain in her leg and groin.  Concerned that it might be PVD as her father also had this complication and she has been smoking for a long time.  She has tried to quit smoking in the past.  Quit for 3 months but she was very moody and agitated and her work told her that she either needed to start smoking again or leave.  She knows it is very important for her to quit but she is not confident she can do it.  She has tried Wellbutrin, hypnosis, nicotine replacement with patches and gum in the past without success.  She has not tried Chantix but her husband was  on Chantix and she did not like how it affected him and is scared to try.  She recently lost her mother and had a bout of depression and is scared that this will become worse with Chantix.  Willing to try Wellbutrin again.  She does not exercise, but she knows she needs to.  She does not feel like she will be able to exercise in the morning but does think that she can get her son and her husband to hold her accountable and walk in the afternoon or evening.  She is hoping her son and husband will walk with her as she feels as though they would both benefit.  We did talk about how if she had PAD that walking can help improve this.  She recalls being on rosuvastatin in the past.  She said that she does not think that she really gave it a shot.  She tolerated the lower dose but when it was increased she had chest tightness and she stopped taking it.  Patient is willing to rechallenge.  She is also willing to do injections if needed.   Current Medications: None Intolerances: atorvastatin (  $'80mg'k$ ), simvastatin (body/leg aches), rosuvastatin (10,20,40) Risk Factors: premature CAD, DM, HTN LDL-C goal: <55 ApoB goal: <70 Non-HDL goal: <80  Diet: meat, vegetable, starch Breakfast Lunch: chips  Drink: mountain dew (5 20oz per day)  Exercise: none  Family History:  Family History  Problem Relation Age of Onset   Colon polyps Father    Stomach cancer Paternal Grandmother    Esophageal cancer Maternal Grandfather    Breast cancer Mother    Ovarian cancer Other        Maternal Side Great  Aunt   Colon cancer Neg Hx    Social History: + tobacco  Labs: Lipid Panel     Component Value Date/Time   CHOL 294 (H) 11/29/2022 0918   TRIG 394 (H) 11/29/2022 0918   HDL 37 (L) 11/29/2022 0918   CHOLHDL 7.9 (H) 11/29/2022 0918   CHOLHDL 8.1 10/30/2015 0300   VLDL 29 10/30/2015 0300   LDLCALC 179 (H) 11/29/2022 0918   LABVLDL 78 (H) 11/29/2022 0918    Past Medical History:  Diagnosis Date    Anxiety    Asthma    DDD (degenerative disc disease)    GERD (gastroesophageal reflux disease)    Hemorrhoids    HIT (heparin-induced thrombocytopenia) (HCC)    Hyperlipemia    Hypertension    Infiltrating ductal carcinoma of left breast ([place]) 12/15/2017   Iron deficiency anemia 11/23/2015   Pulmonary emboli ([place]) 12/07/2015   [place] spotted fever    TMJ (temporomandibular joint disorder)    Type 2 diabetes mellitus ([place]) 12/15/2017    Current Outpatient Medications on File Prior to Visit  Medication Sig Dispense Refill   acetaminophen (TYLENOL) 500 MG tablet Take 1,000 mg by mouth every 6 (six) hours as needed for mild pain.     albuterol (VENTOLIN HFA) 108 (90 Base) MCG/ACT inhaler Inhale 2 puffs into the lungs every 4 (four) hours as needed for wheezing or shortness of breath. 18 g 1   amLODipine (NORVASC) 5 MG tablet Take 1 tablet (5 mg total) by mouth daily. 90 tablet 1   aspirin EC 81 MG tablet Take 81 mg by mouth every evening.      blood glucose meter kit and supplies Dispense based on patient and insurance preference. Use up to four times daily as directed. (FOR ICD-10 E10.9, E11.9). 1 each 0   budesonide-formoterol (SYMBICORT) 80-4.5 MCG/ACT inhaler Inhale 2 puffs into the lungs 2 (two) times daily. 10.2 g 3   calcium carbonate (OSCAL) 1500 (600 Ca) MG TABS tablet Take 600 mg of elemental calcium by mouth daily with breakfast.     cyclobenzaprine (FLEXERIL) 10 MG tablet TAKE ONE TABLET BY MOUTH THREE TIMES DAILY AS NEEDED FOR MUSCLE SPASM 30 tablet 0   diphenhydrAMINE (BENADRYL) 25 MG tablet Take 25 mg by mouth every 6 (six) hours as needed for itching or allergies.     esomeprazole (NEXIUM) 40 MG capsule Take 1 capsule (40 mg total) by mouth daily. 90 capsule 1   FLUoxetine (PROZAC) 40 MG capsule Take 2 capsules (80 mg total) by mouth daily. TAKE ONE (1) CAPSULE EACH DAY 180 capsule 1   fluticasone (FLONASE) 50 MCG/ACT nasal spray Place 2 sprays into both nostrils  daily. 16 g 6   HYDROcodone-acetaminophen (NORCO/VICODIN) 5-325 MG tablet Take 1 tablet by mouth every 4 (four) hours as needed. 10 tablet 0   ipratropium-albuterol (DUONEB) 0.5-2.5 (3) MG/3ML SOLN Take 3 mLs by nebulization every 6 (six) hours as needed (Foir shortness  of breath). 360 mL 0   isosorbide mononitrate (IMDUR) 30 MG 24 hr tablet Take 1 tablet (30 mg total) by mouth daily. 30 tablet 6   LORazepam (ATIVAN) 0.5 MG tablet Take 1 tablet (0.5 mg total) by mouth every 8 (eight) hours as needed for anxiety. 90 tablet 2   magnesium oxide (MAG-OX) 400 MG tablet Take 1 tablet (400 mg total) by mouth daily. (Patient taking differently: Take 800 mg by mouth 2 (two) times daily.) 90 tablet 1   metFORMIN (GLUCOPHAGE) 1000 MG tablet Take 1 tablet (1,000 mg total) by mouth 2 (two) times daily. 180 tablet 1   metoprolol tartrate (LOPRESSOR) 50 MG tablet Take 1 tablet (50 mg total) by mouth 2 (two) times daily. 180 tablet 1   nitroGLYCERIN (NITROSTAT) 0.4 MG SL tablet Place 1 tablet (0.4 mg total) under the tongue every 5 (five) minutes as needed for chest pain. 90 tablet 0   ondansetron (ZOFRAN-ODT) 4 MG disintegrating tablet Take 1 tablet (4 mg total) by mouth every 8 (eight) hours as needed. 20 tablet 0   prochlorperazine (COMPAZINE) 10 MG tablet Take by mouth.     No current facility-administered medications on file prior to visit.    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   Sulfa Antibiotics Itching, Rash and Other (See Comments)    Assessment/Plan:  1. Hyperlipidemia -  Type 2 diabetes mellitus (HCC) Assessment: Blood blood sugar uncontrolled.  Last A1c was 9 Currently on Rybelsus 3 mg daily.  Not tolerating well Also on metformin 1000 mg twice a day She consumes a significant amount of sugar from her drinks.  She is aware of this and knows that this needs to change. Discussed the cardiovascular benefits of Ozempic.  Do not have this data with Rybelsus yet. Patient is aware that nausea is a common side effect of both Rybelsus and Ozempic but can be mitigated by eating small meals more frequently  Plan: Patient would like to switch from Rybelsus to Ozempic Stop Rybelsus (patient has not taken in the last couple days) and start Ozempic 0.25 mg weekly for 4 weeks and then increase to 0.5 mg weekly Written titration schedule given to patient Injection technique reviewed along with tips to help improve tolerability I will call patient in 3 weeks to follow-up with how she is doing and we will decide at that point if she titrates up to 0.5 mg Strongly encourage physical activity.  We have set a goal of starting to walk nightly.  Can start with 10 or 15 minutes several times a  week and increase both the time and days per week. Eventually I would like her to add in strength training, with the need for abdominal surgery and the many changes that we were making, this will be a goal set later on Snack on foods high in protein and healthy fat instead of purely  carbohydrates  Hyperlipidemia Assessment: LDL-C above goal of less than 55 We discussed her risk factors quitting premature ASCVD diabetes hypertension and the need for a very aggressive goal Patient does not feel like she gave rosuvastatin a full shot.  Willing to rechallenge We did discuss PCSK9 inhibitor therapy as well Modifiable risk factors as seen in the rest of the A&P were discussed  Plan: Start rosuvastatin 10 mg daily She will go to [facility] and get labs drawn in April After labs result will determine next move.  Most likely will not be at goal can consider increasing statin or adding PCSK9 inhibitor or ezetimibe depending on what her LDL-C is Increase physical activity  Tobacco abuse Assessment: Patient is aware of the importance of quitting smoking however has struggled in the past and is not confident that she can quit She has tried Wellbutrin, hypnosis, nicotine replacement with patches and gum in the past without success. Her husband tried Chantix and she did not like the way it made him act She is willing to retry Wellbutrin  Plan: Several lifestyle changes were discussed and implemented today.  Smoking cessation will surely be the hardest 1 for patient to achieve Plan to rediscuss with patient at follow-up in 3 weeks At that time we can discuss adding the Wellbutrin and setting a quit date if she is feeling comfortable with her other lifestyle changes at that time  Essential hypertension Patient was nervous to start isosorbide.  I reviewed this medication with her and encouraged her to start.    Thank you,  Ramond Dial, Pharm.D, BCPS, CPP [facility] [address], [place], [place] [address]  Phone: [phone]; Fax: [phone]

## 2022-12-15 NOTE — Assessment & Plan Note (Signed)
Patient was nervous to start isosorbide.  I reviewed this medication with her and encouraged her to start.

## 2022-12-15 NOTE — Patient Instructions (Addendum)
ACTION PLAN: Start walking in the evening- find someone to hold you accountable Increase water intake and cut back on mountain dew 1 less per week Start rosuvastatin '10mg'$  daily Labs in April STOP Rybelsus and Start Ozempic  GLP-1 Receptor Agonist Counseling Points This medication reduces your appetite and may make you feel fuller longer.  Stop eating when your body tells you that you are full. This will likely happen sooner than you are used to. Fried/greasy food and sweets may upset your stomach - minimize these as much as possible. Store your medication in the fridge until you are ready to use it. Inject your medication in the fatty tissue of your lower abdominal area (2 inches away from belly button) or upper outer thigh. Rotate injection sites. Common side effects include: nausea, diarrhea/constipation, and heartburn, and are more likely to occur if you overeat. Stop your injection for 7 days prior to surgical procedures requiring anesthesia.  Dosing schedule: - Month 1: Inject 0.'25mg'$  subcutaneously once weekly for 4 weeks - Month 2: Inject 0.'5mg'$  subcutaneously once weekly for 4 weeks - Month 3: Inject '1mg'$  subcutaneously once weekly for 4 weeks - Month 4: Inject '2mg'$   subcutaneously once weekly  Tips for success: Write down the reasons why you want to lose weight and post it in a place where you'll see it often.  Start small and work your way up. Keep in mind that it takes time to achieve goals, and small steps add up.  Any additional movements help to burn calories. Taking the stairs rather than the elevator and parking at the far end of your parking lot are easy ways to start. Brisk walking for at least 30 minutes 4 or more days of the week is an excellent goal to work toward  Understanding what it means to feel full: Did you know that it can take 15 minutes or more for your brain to receive the message that you've eaten? That means that, if you eat less food, but consume it slower,  you may still feel satisfied.  Eating a lot of fruits and vegetables can also help you feel fuller.  Eat off of smaller plates so that moderate portions don't seem too small  Tips for living a healthier life     Building a Healthy and Balanced Diet Make most of your meal vegetables and fruits -  of your plate. Aim for color and variety, and remember that potatoes don't count as vegetables on the Healthy Eating Plate because of their negative impact on blood sugar.  Go for whole grains -  of your plate. Whole and intact grains--whole wheat, barley, wheat berries, quinoa, oats, brown rice, and foods made with them, such as whole wheat pasta--have a milder effect on blood sugar and insulin than white bread, white rice, and other refined grains.  Protein power -  of your plate. Fish, poultry, beans, and nuts are all healthy, versatile protein sources--they can be mixed into salads, and pair well with vegetables on a plate. Limit red meat, and avoid processed meats such as bacon and sausage.  Healthy plant oils - in moderation. Choose healthy vegetable oils like olive, canola, soy, corn, sunflower, peanut, and others, and avoid partially hydrogenated oils, which contain unhealthy trans fats. Remember that low-fat does not mean "healthy."  Drink water, coffee, or tea. Skip sugary drinks, limit milk and dairy products to one to two servings per day, and limit juice to a small glass per day.  Stay active. The red  figure running across the [place] is a reminder that staying active is also important in weight control.  The main message of the Healthy Eating Plate is to focus on diet quality:  The type of carbohydrate in the diet is more important than the amount of carbohydrate in the diet, because some sources of carbohydrate--like vegetables (other than potatoes), fruits, whole grains, and beans--are healthier than others. The Healthy Eating Plate also advises  consumers to avoid sugary beverages, a major source of calories--usually with little nutritional value--in the American diet. The Healthy Eating Plate encourages consumers to use healthy oils, and it does not set a maximum on the percentage of calories people should get each day from healthy sources of fat. In this way, the Healthy Eating Plate recommends the opposite of the low-fat message promoted for decades by the USDA.  DeskDistributor.no  SUGAR  Sugar is a huge problem in the modern day diet. Sugar is a big contributor to heart disease, diabetes, high triglyceride levels, fatty liver disease and obesity. Sugar is hidden in almost all packaged foods/beverages. Added sugar is extra sugar that is added beyond what is naturally found and has no nutritional benefit for your body. The American Heart Association recommends limiting added sugars to no more than 25g for women and 36 grams for men per day. There are many names for sugar including maltose, sucrose (names ending in "ose"), high fructose corn syrup, molasses, cane sugar, corn sweetener, raw sugar, syrup, honey or fruit juice concentrate.   One of the best ways to limit your added sugars is to stop drinking sweetened beverages such as soda, sweet tea, and fruit juice.  There is 65g of added sugars in one 20oz bottle of Coke! That is equal to 7.5 donuts.   Pay attention and read all nutrition facts labels. Below is an examples of a nutrition facts label. The #1 is showing you the total sugars where the # 2 is showing you the added sugars. This one serving has almost the max amount of added sugars per day!   EXERCISE  Exercise is good. We've all heard that. In an ideal world, we would all have time and resources to get plenty of it. When you are active, your heart pumps more efficiently and you will feel better.  Multiple studies show that even walking regularly has benefits that include living  a longer life. The American Heart Association recommends 150 minutes per week of exercise (30 minutes per day most days of the week). You can do this in any increment you wish. Nine or more 10-minute walks count. So does an hour-long exercise class. Break the time apart into what will work in your life. Some of the best things you can do include walking briskly, jogging, cycling or swimming laps. Not everyone is ready to "exercise." Sometimes we need to start with just getting active. Here are some easy ways to be more active throughout the day:  Take the stairs instead of the elevator  Go for a 10-15 minute walk during your lunch break (find a friend to make it more enjoyable)  When shopping, park at the back of the parking lot  If you take public transportation, get off one stop early and walk the extra distance  Pace around while making phone calls  Check with your doctor if you aren't sure what your limitations may be. Always remember to drink plenty of water when doing any type of exercise. Don't feel like a  failure if you're not getting the 90-150 minutes per week. If you started by being a couch potato, then just a 10-minute walk each day is a huge improvement. Start with little victories and work your way up.   HEALTHY EATING TIPS              Plan ahead: make a menu of the meals for a week then create a grocery list to go with that menu. Consider meals that easily stretch into a night of leftovers, such as stews or casseroles. Or consider making two of your favorite meal and put one in the freezer for another night. Try a night or two each week that is "meatless" or "no cook" such as salads. When you get home from the grocery store wash and prepare your vegetables and fruits. Then when you need them they are ready to go.   Tips for going to the grocery store:  [place] store or generic brands  Check the weekly ad from your store on-line or in their in-store flyer  Look at the unit price on the  shelf tag to compare/contrast the costs of different items  Buy fruits/vegetables in season  Carrots, bananas and apples are low-cost, naturally healthy items  If meats or frozen vegetables are on sale, buy some extras and put in your freezer  Limit buying prepared or "ready to eat" items, even if they are pre-made salads or fruit snacks  Do not shop when you're hungry  Foods at eye level tend to be more expensive. Look on the high and low shelves for deals.  Consider shopping at the farmer's market for fresh foods in season.  Avoid the cookie and chip aisles (these are expensive, high in calories and low in nutritional value). Shop on the outside of the grocery store.  Healthy food preparations:  If you can't get lean hamburger, be sure to drain the fat when cooking  Steam, saut (in olive oil), grill or bake foods  Experiment with different seasonings to avoid adding salt to your foods. Kosher salt, sea salt and Himalayan salt are all still salt and should be avoided. Try seasoning food with onion, garlic, thyme, rosemary, basil ect. Onion powder or garlic powder is ok. Avoid if it says salt (ie garlic salt).

## 2022-12-15 NOTE — Assessment & Plan Note (Signed)
Assessment: Patient is aware of the importance of quitting smoking however has struggled in the past and is not confident that she can quit She has tried Wellbutrin, hypnosis, nicotine replacement with patches and gum in the past without success. Her husband tried Chantix and she did not like the way it made him act She is willing to retry Wellbutrin  Plan: Several lifestyle changes were discussed and implemented today.  Smoking cessation will surely be the hardest 1 for patient to achieve Plan to rediscuss with patient at follow-up in 3 weeks At that time we can discuss adding the Wellbutrin and setting a quit date if she is feeling comfortable with her other lifestyle changes at that time

## 2022-12-16 ENCOUNTER — Telehealth: Payer: Self-pay

## 2022-12-16 NOTE — Telephone Encounter (Signed)
Spoke with patient and reviewed results. Patient verbalized understanding and thanked me for the call.

## 2022-12-20 ENCOUNTER — Telehealth: Payer: Self-pay | Admitting: Pharmacist

## 2022-12-20 ENCOUNTER — Encounter: Payer: Self-pay | Admitting: Physician Assistant

## 2022-12-20 ENCOUNTER — Ambulatory Visit: Payer: Managed Care, Other (non HMO) | Attending: Physician Assistant | Admitting: Physician Assistant

## 2022-12-20 VITALS — BP 98/70 | HR 82 | Ht 68.0 in | Wt 210.2 lb

## 2022-12-20 DIAGNOSIS — E785 Hyperlipidemia, unspecified: Secondary | ICD-10-CM

## 2022-12-20 DIAGNOSIS — Z86711 Personal history of pulmonary embolism: Secondary | ICD-10-CM

## 2022-12-20 DIAGNOSIS — I10 Essential (primary) hypertension: Secondary | ICD-10-CM | POA: Diagnosis not present

## 2022-12-20 DIAGNOSIS — I25119 Atherosclerotic heart disease of native coronary artery with unspecified angina pectoris: Secondary | ICD-10-CM | POA: Diagnosis not present

## 2022-12-20 DIAGNOSIS — E1100 Type 2 diabetes mellitus with hyperosmolarity without nonketotic hyperglycemic-hyperosmolar coma (NKHHC): Secondary | ICD-10-CM

## 2022-12-20 DIAGNOSIS — Z01818 Encounter for other preprocedural examination: Secondary | ICD-10-CM

## 2022-12-20 DIAGNOSIS — E669 Obesity, unspecified: Secondary | ICD-10-CM

## 2022-12-20 DIAGNOSIS — Z8249 Family history of ischemic heart disease and other diseases of the circulatory system: Secondary | ICD-10-CM

## 2022-12-20 NOTE — Patient Instructions (Signed)
Medication Instructions:  Stop Isosorbide   *If you need a refill on your cardiac medications before your next appointment, please call your pharmacy*   Lab Work: None ordered   If you have labs (blood work) drawn today and your tests are completely normal, you will receive your results only by: [place] (if you have MyChart) OR A paper copy in the mail If you have any lab test that is abnormal or we need to change your treatment, we will call you to review the results.   Testing/Procedures: None ordered    Follow-Up: Follow up scheduled   Other Instructions

## 2022-12-21 ENCOUNTER — Encounter: Payer: Self-pay | Admitting: Pharmacist

## 2022-12-21 MED ORDER — SEMAGLUTIDE(0.25 OR 0.5MG/DOS) 2 MG/3ML ~~LOC~~ SOPN: PEN_INJECTOR | SUBCUTANEOUS | 1 refills | Status: DC

## 2022-12-21 NOTE — Telephone Encounter (Signed)
Pt contacted [place] that her Ozempic was denied. When I tried [place] it was auto approved. Called pharmacy to see why what the denial was.  Per pharmacist it went through when he re-ran it.  Patient made aware that Rx was approved.

## 2022-12-27 MED ORDER — FREESTYLE LIBRE 3 SENSOR MISC: 22 refills | Status: DC

## 2022-12-28 ENCOUNTER — Telehealth: Payer: Self-pay | Admitting: *Deleted

## 2022-12-28 NOTE — Telephone Encounter (Signed)
Received call from patient [phone] telephone.   Patient reports episode of cramping under left breast towards midline. States that she does not feel this is cardiac in nature.   Reports cramping and bulging to area that relieves after some time. States that bulging will resolve if she presses into area. Reports that bulging area is plum size, or around 0.50 cent piece.   States that cramping may be due to magnesium levels, and she is having labs drawn on 12/28/2022.   Discussed concerns of new hernia with Dr. Okey Dupre. Advised that hernia formation is usually not that high and is rare that it would bulge as there is no bowel or intestines in that area.   Dr. Okey Dupre will review recent imaging with radiology and contact patient to discuss.

## 2022-12-29 ENCOUNTER — Other Ambulatory Visit: Payer: Managed Care, Other (non HMO)

## 2022-12-29 DIAGNOSIS — R790 Abnormal level of blood mineral: Secondary | ICD-10-CM

## 2022-12-30 ENCOUNTER — Other Ambulatory Visit: Payer: Self-pay

## 2022-12-30 DIAGNOSIS — I25119 Atherosclerotic heart disease of native coronary artery with unspecified angina pectoris: Secondary | ICD-10-CM

## 2022-12-30 LAB — MAGNESIUM: Magnesium: 1.6 mg/dL (ref 1.6–2.3)

## 2022-12-30 NOTE — Progress Notes (Signed)
LEA dopplers and ABIs orders per Dr. Gwenlyn Found after conversation with Marcelle Overlie, RPH-CPP.

## 2023-01-05 NOTE — Patient Instructions (Addendum)
Brenda Cruz  01/05/2023     @PREFPERIOPPHARMACY @   Your procedure is scheduled on 01/11/23.  Report to [facility] at 9:00 A.M.  Call this number if you have problems the morning of surgery:  [phone]  If you experience any cold or flu symptoms such as cough, fever, chills, shortness of breath, etc. between now and your scheduled surgery, please notify us at the above number.   Remember:   Do not eat or drink after midnight.     Take these medicines the morning of surgery with A SIP OF WATER : Norvasc Flexerol Nexium Flonase Norco (if needed) Ativan (if needed) and Metoprolol   Last dose of Rybelsus should be on 01/07/23   Do not take any diabetic meds the am of the procedure.   Please use your inhaler before coming to the hospital and bring your rescue inhaler with you.     Do not wear jewelry, make-up or nail polish.  Do not wear lotions, powders, or perfumes, or deodorant.  Do not shave 48 hours prior to surgery.  Men may shave face and neck. / Do not bring valuables to the hospital.  [facility] is not responsible for any belongings or valuables.  Contacts, dentures or bridgework may not be worn into surgery.  Leave your suitcase in the car.  After surgery it may be brought to your room.  For patients admitted to the hospital, discharge time will be determined by your treatment team.  Patients discharged the day of surgery will not be allowed to drive home.   Name and phone number of your driver:   Family Special instructions:  N/A / Please read over the following fact sheets that you were given. Care and Recovery After Surgery  Laparoscopic Ventral Hernia Repair Laparoscopic ventral hernia repairis a procedure to fix a bulge of tissue that pushes through a weak area of muscle in the abdomen (ventral hernia). A ventral hernia may be: Above the belly button. This is called an epigastric hernia. At the belly button. This is called an umbilical hernia. At the  incision site from previous abdominal surgery. This is called an incisional hernia. You may have this procedure as emergency surgery if part of your intestine gets trapped inside the hernia and starts to lose its blood supply (strangulation). Laparoscopic surgery is done through small incisions using a thin surgical telescope with a light and camera (laparoscope). During surgery, your surgeon will use images from the laparoscope to guide the procedure. A mesh screen will be placed in the hernia to close the opening and strengthen the abdominal wall. Tell a health care provider about: Any allergies you have. All medicines you are taking, including vitamins, herbs, eye drops, creams, and over-the-counter medicines. Any problems you or family members have had with anesthetic medicines. Louanna Raw blood disorders you have. Any surgeries you have had. Any medical conditions you have. Whether you are pregnant or may be pregnant. What are the risks? Generally, this is a safe procedure. However, problems may occur, including: Infection. Bleeding. Damage to nearby structures or organs in the abdomen. Trouble urinating or having a bowel movement after surgery. Blood clots. The hernia coming back after surgery. Fluid buildup in the area of the hernia. In some cases, your health care provider may need to switch from a laparoscopic procedure to a procedure that is done through a single, larger incision in the abdomen (open procedure). You may need an open procedure if: You have a hernia  that is difficult to repair. Your organs are hard to see with the laparoscope. You have bleeding problems during the laparoscopic procedure. What happens before the procedure? Staying hydrated Follow instructions from your health care provider about hydration, which may include: Up to 2 hours before the procedure - you may continue to drink clear liquids, such as water, clear fruit juice, black coffee, and plain  tea.  Eating and drinking restrictions Follow instructions from your health care provider about eating and drinking, which may include: 8 hours before the procedure - stop eating heavy meals or foods, such as meat, fried foods, or fatty foods. 6 hours before the procedure - stop eating light meals or foods, such as toast or cereal. 6 hours before the procedure - stop drinking milk or drinks that contain milk. 2 hours before the procedure - stop drinking clear liquids. Medicines Ask your health care provider about: Changing or stopping your regular medicines. This is especially important if you are taking diabetes medicines or blood thinners. Taking medicines such as aspirin and ibuprofen. These medicines can thin your blood. Do not take these medicines unless your health care provider tells you to take them. Taking over-the-counter medicines, vitamins, herbs, and supplements. Tests You may need to have tests before the procedure, such as: Blood tests. Urine tests. Abdominal ultrasound. Chest X-ray. Electrocardiogram (ECG). General instructions You may be asked to take a laxative or do an enema to empty your bowel before surgery (bowel prep). Do not use any products that contain nicotine or tobacco for at least 4 weeks before the procedure. These products include cigarettes, chewing tobacco, and vaping devices, such as e-cigarettes. If you need help quitting, ask your health care provider. Ask your health care provider: How your surgery site will be marked. What steps will be taken to help prevent infection. These steps may include: Removing hair at the surgery site. Washing skin with a germ-killing soap. Receiving antibiotic medicine. Plan to have a responsible adult take you home from the hospital or clinic. If you will be going home right after the procedure, plan to have a responsible adult care for you for the time you are told. This is important. What happens during the  procedure?  An IV will be inserted into one of your veins. You will be given one or more of the following: A medicine to help you relax (sedative). A medicine to numb the area (local anesthetic). A medicine to make you fall asleep (general anesthetic). A small incision will be made in your abdomen. A hollow metal tube (trocar) will be placed through the incision. A tube will be placed through the trocar to inflate your abdomen with carbon dioxide. This makes it easier for your surgeon to see inside your abdomen during the repair. A laparoscope will be inserted into your abdomen through the trocar. The laparoscope will send images to a monitor in the operating room. Other trocars will be put through other small incisions in your abdomen. The surgical instruments needed for the procedure will be placed through these trocars. The tissue or intestines that make up the hernia will be moved back into place. The edges of the hernia may be stitched (sutured) together. A piece of mesh will be used to close the hernia. Sutures, clips, or staples will be used to keep the mesh in place. A bandage (dressing) or skin glue will be put over the incisions. The procedure may vary among health care providers and hospitals. What happens after the procedure?  Your blood pressure, heart rate, breathing rate, and blood oxygen level will be monitored until you leave the hospital or clinic. You will continue to receive fluids and medicines through an IV. Your IV will be removed when you can drink clear fluids. You will be given pain medicine as needed. You will be encouraged to get up and walk around as soon as possible. You will be shown how to do deep breathing exercises to help prevent a lung infection. If you were given a sedative during the procedure, it can affect you for several hours. Do not drive or operate machinery until your health care provider says that it is safe. Summary Laparoscopic ventral hernia is  an operation to fix a hernia using small incisions. Tell your health care provider about other medical conditions that you have and about all the medicines that you are taking. Follow instructions from your health care provider about eating and drinking before the procedure. Plan to have a responsible adult take you home from the hospital or clinic. After the procedure, you will be encouraged to walk as soon as possible. You will also be taught how to do deep breathing exercises. This information is not intended to replace advice given to you by your health care provider. Make sure you discuss any questions you have with your health care provider. Document Revised: 05/22/2020 Document Reviewed: 05/22/2020 Elsevier Patient Education  [place] Anesthesia, Adult General anesthesia is the use of medicine to make you fall asleep (unconscious) for a medical procedure. General anesthesia must be used for certain procedures. It is often recommended for surgery or procedures that: Last a long time. Require you to be still or in an unusual position. Are major and can cause blood loss. Affect your breathing. The medicines used for general anesthesia are called general anesthetics. During general anesthesia, these medicines are given along with medicines that: Prevent pain. Control your blood pressure. Relax your muscles. Prevent nausea and vomiting after the procedure. Tell a health care provider about: Any allergies you have. All medicines you are taking, including vitamins, herbs, eye drops, creams, and over-the-counter medicines. Your history of any: Medical conditions you have, including: High blood pressure. Bleeding problems. Diabetes. Heart or lung conditions, such as: Heart failure. Sleep apnea. Asthma. Chronic obstructive pulmonary disease (COPD). Current or recent illnesses, such as: Upper respiratory, chest, or ear infections. Cough or fever. Tobacco or drug  use, including marijuana or alcohol use. Depression or anxiety. Surgeries and types of anesthetics you have had. Problems you or family members have had with anesthetic medicines. Whether you are pregnant or may be pregnant. Whether you have any chipped or loose teeth, dentures, caps, bridgework, or issues with your mouth, swallowing, or choking. What are the risks? Your health care provider will talk with you about risks. These may include: Allergic reaction to the medicines. Lung and heart problems. Inhaling food or liquid from the stomach into the lungs (aspiration). Nerve injury. Injury to the lips, mouth, teeth, or gums. Stroke. Waking up during your procedure and being unable to move. This is rare. These problems are more likely to develop if you are having a major surgery or if you have an advanced or serious medical condition. You can prevent some of these complications by answering all of your health care provider's questions thoroughly and by following all instructions before your procedure. General anesthesia can cause side effects, including: Nausea or vomiting. A sore throat or hoarseness from the breathing tube. Wheezing  or coughing. Shaking chills or feeling cold. Body aches. Sleepiness. Confusion, agitation (delirium), or anxiety. What happens before the procedure? When to stop eating and drinking Follow instructions from your health care provider about what you may eat and drink before your procedure. If you do not follow your health care provider's instructions, your procedure may be delayed or canceled. Medicines Ask your health care provider about: Changing or stopping your regular medicines. These include any diabetes medicines or blood thinners you take. Taking medicines such as aspirin and ibuprofen. These medicines can thin your blood. Do not take them unless your health care provider tells you to. Taking over-the-counter medicines, vitamins, herbs, and  supplements. General instructions Do not use any products that contain nicotine or tobacco for at least 4 weeks before the procedure. These products include cigarettes, chewing tobacco, and vaping devices, such as e-cigarettes. If you need help quitting, ask your health care provider. If you brush your teeth on the morning of the procedure, make sure to spit out all of the water and toothpaste. If told by your health care provider, bring your sleep apnea device with you to surgery (if applicable). If you will be going home right after the procedure, plan to have a responsible adult: Take you home from the hospital or clinic. You will not be allowed to drive. Care for you for the time you are told. What happens during the procedure?  An IV will be inserted into one of your veins. You will be given one or more of the following through a face mask or IV: A sedative. This helps you relax. Anesthesia. This will: Numb certain areas of your body. Make you fall asleep for surgery. After you are unconscious, a breathing tube may be inserted down your throat to help you breathe. This will be removed before you wake up. An anesthesia provider, such as an anesthesiologist, will stay with you throughout your procedure. The anesthesia provider will: Keep you comfortable and safe by continuing to give you medicines and adjusting the amount of medicine that you get. Monitor your blood pressure, heart rate, and oxygen levels to make sure that the anesthetics do not cause any problems. The procedure may vary among health care providers and hospitals. What happens after the procedure? Your blood pressure, temperature, heart rate, breathing rate, and blood oxygen level will be monitored until you leave the hospital or clinic. You will wake up in a recovery area. You may wake up slowly. You may be given medicine to help you with pain, nausea, or any other side effects from the anesthesia. Summary General  anesthesia is the use of medicine to make you fall asleep (unconscious) for a medical procedure. Follow your health care provider's instructions about when to stop eating, drinking, or taking certain medicines before your procedure. Plan to have a responsible adult take you home from the hospital or clinic. This information is not intended to replace advice given to you by your health care provider. Make sure you discuss any questions you have with your health care provider. Document Revised: 12/30/2021 Document Reviewed: 12/30/2021 Elsevier Patient Education  [place].  How to Use Chlorhexidine Before Surgery Chlorhexidine gluconate (CHG) is a germ-killing (antiseptic) solution that is used to clean the skin. It can get rid of the bacteria that normally live on the skin and can keep them away for about 24 hours. To clean your skin with CHG, you may be given: A CHG solution to use in the shower or  as part of a sponge bath. A prepackaged cloth that contains CHG. Cleaning your skin with CHG may help lower the risk for infection: While you are staying in the intensive care unit of the hospital. If you have a vascular access, such as a central line, to provide short-term or long-term access to your veins. If you have a catheter to drain urine from your bladder. If you are on a ventilator. A ventilator is a machine that helps you breathe by moving air in and out of your lungs. After surgery. What are the risks? Risks of using CHG include: A skin reaction. Hearing loss, if CHG gets in your ears and you have a perforated eardrum. Eye injury, if CHG gets in your eyes and is not rinsed out. The CHG product catching fire. Make sure that you avoid smoking and flames after applying CHG to your skin. Do not use CHG: If you have a chlorhexidine allergy or have previously reacted to chlorhexidine. On babies younger than 59 months of age. How to use CHG solution Use CHG only as told by your  health care provider, and follow the instructions on the label. Use the full amount of CHG as directed. Usually, this is one bottle. During a shower Follow these steps when using CHG solution during a shower (unless your health care provider gives you different instructions): Start the shower. Use your normal soap and shampoo to wash your face and hair. Turn off the shower or move out of the shower stream. Pour the CHG onto a clean washcloth. Do not use any type of brush or rough-edged sponge. Starting at your neck, lather your body down to your toes. Make sure you follow these instructions: If you will be having surgery, pay special attention to the part of your body where you will be having surgery. Scrub this area for at least 1 minute. Do not use CHG on your head or face. If the solution gets into your ears or eyes, rinse them well with water. Avoid your genital area. Avoid any areas of skin that have broken skin, cuts, or scrapes. Scrub your back and under your arms. Make sure to wash skin folds. Let the lather sit on your skin for 1-2 minutes or as long as told by your health care provider. Thoroughly rinse your entire body in the shower. Make sure that all body creases and crevices are rinsed well. Dry off with a clean towel. Do not put any substances on your body afterward--such as powder, lotion, or perfume--unless you are told to do so by your health care provider. Only use lotions that are recommended by the manufacturer. Put on clean clothes or pajamas. If it is the night before your surgery, sleep in clean sheets.  During a sponge bath Follow these steps when using CHG solution during a sponge bath (unless your health care provider gives you different instructions): Use your normal soap and shampoo to wash your face and hair. Pour the CHG onto a clean washcloth. Starting at your neck, lather your body down to your toes. Make sure you follow these instructions: If you will be  having surgery, pay special attention to the part of your body where you will be having surgery. Scrub this area for at least 1 minute. Do not use CHG on your head or face. If the solution gets into your ears or eyes, rinse them well with water. Avoid your genital area. Avoid any areas of skin that have broken skin, cuts, or  scrapes. Scrub your back and under your arms. Make sure to wash skin folds. Let the lather sit on your skin for 1-2 minutes or as long as told by your health care provider. Using a different clean, wet washcloth, thoroughly rinse your entire body. Make sure that all body creases and crevices are rinsed well. Dry off with a clean towel. Do not put any substances on your body afterward--such as powder, lotion, or perfume--unless you are told to do so by your health care provider. Only use lotions that are recommended by the manufacturer. Put on clean clothes or pajamas. If it is the night before your surgery, sleep in clean sheets. How to use CHG prepackaged cloths Only use CHG cloths as told by your health care provider, and follow the instructions on the label. Use the CHG cloth on clean, dry skin. Do not use the CHG cloth on your head or face unless your health care provider tells you to. When washing with the CHG cloth: Avoid your genital area. Avoid any areas of skin that have broken skin, cuts, or scrapes. Before surgery Follow these steps when using a CHG cloth to clean before surgery (unless your health care provider gives you different instructions): Using the CHG cloth, vigorously scrub the part of your body where you will be having surgery. Scrub using a back-and-forth motion for 3 minutes. The area on your body should be completely wet with CHG when you are done scrubbing. Do not rinse. Discard the cloth and let the area air-dry. Do not put any substances on the area afterward, such as powder, lotion, or perfume. Put on clean clothes or pajamas. If it is the night  before your surgery, sleep in clean sheets.  For general bathing Follow these steps when using CHG cloths for general bathing (unless your health care provider gives you different instructions). Use a separate CHG cloth for each area of your body. Make sure you wash between any folds of skin and between your fingers and toes. Wash your body in the following order, switching to a new cloth after each step: The front of your neck, shoulders, and chest. Both of your arms, under your arms, and your hands. Your stomach and groin area, avoiding the genitals. Your right leg and foot. Your left leg and foot. The back of your neck, your back, and your buttocks. Do not rinse. Discard the cloth and let the area air-dry. Do not put any substances on your body afterward--such as powder, lotion, or perfume--unless you are told to do so by your health care provider. Only use lotions that are recommended by the manufacturer. Put on clean clothes or pajamas. Contact a health care provider if: Your skin gets irritated after scrubbing. You have questions about using your solution or cloth. You swallow any chlorhexidine. Call your local poison control center (1-909-268-5307 in the [place] Get help right away if: Your eyes itch badly, or they become very red or swollen. Your skin itches badly and is red or swollen. Your hearing changes. You have trouble seeing. You have swelling or tingling in your mouth or throat. You have trouble breathing. These symptoms may represent a serious problem that is an emergency. Do not wait to see if the symptoms will go away. Get medical help right away. Call your local emergency services (911 in the [place] Do not drive yourself to the hospital. Summary Chlorhexidine gluconate (CHG) is a germ-killing (antiseptic) solution that is used to clean the skin. Cleaning your skin  with CHG may help to lower your risk for infection. You may be given CHG to use for bathing. It may be in a  bottle or in a prepackaged cloth to use on your skin. Carefully follow your health care provider's instructions and the instructions on the product label. Do not use CHG if you have a chlorhexidine allergy. Contact your health care provider if your skin gets irritated after scrubbing. This information is not intended to replace advice given to you by your health care provider. Make sure you discuss any questions you have with your health care provider. Document Revised: 01/31/2022 Document Reviewed: 12/14/2020 Elsevier Patient Education  [place].

## 2023-01-06 ENCOUNTER — Other Ambulatory Visit: Payer: Self-pay

## 2023-01-09 ENCOUNTER — Encounter (HOSPITAL_COMMUNITY)
Admission: RE | Admit: 2023-01-09 | Discharge: 2023-01-09 | Disposition: A | Discharge status: Home / Self Care | Payer: Managed Care, Other (non HMO) | Source: Ambulatory Visit | Attending: Surgery

## 2023-01-09 ENCOUNTER — Encounter: Payer: Self-pay | Admitting: Nurse Practitioner

## 2023-01-09 ENCOUNTER — Encounter (HOSPITAL_COMMUNITY): Payer: Self-pay

## 2023-01-09 ENCOUNTER — Other Ambulatory Visit: Payer: Self-pay

## 2023-01-09 VITALS — BP 109/80 | Resp 16 | Ht 68.0 in | Wt 207.0 lb

## 2023-01-09 DIAGNOSIS — Z01812 Encounter for preprocedural laboratory examination: Secondary | ICD-10-CM | POA: Insufficient documentation

## 2023-01-09 DIAGNOSIS — R790 Abnormal level of blood mineral: Secondary | ICD-10-CM | POA: Insufficient documentation

## 2023-01-09 DIAGNOSIS — E1100 Type 2 diabetes mellitus with hyperosmolarity without nonketotic hyperglycemic-hyperosmolar coma (NKHHC): Secondary | ICD-10-CM | POA: Diagnosis not present

## 2023-01-09 HISTORY — DX: Nausea with vomiting, unspecified: R11.2

## 2023-01-09 HISTORY — DX: Atherosclerotic heart disease of native coronary artery without angina pectoris: I25.10

## 2023-01-09 HISTORY — DX: Other specified postprocedural states: Z98.890

## 2023-01-09 HISTORY — DX: Chronic obstructive pulmonary disease, unspecified: J44.9

## 2023-01-09 LAB — BASIC METABOLIC PANEL
Anion gap: 9 (ref 5–15)
BUN: 9 mg/dL (ref 6–20)
CO2: 25 mmol/L (ref 22–32)
Calcium: 8.9 mg/dL (ref 8.9–10.3)
Chloride: 102 mmol/L (ref 98–111)
Creatinine, Ser: 1.02 mg/dL — ABNORMAL HIGH (ref 0.44–1.00)
GFR, Estimated: >60 mL/min (ref >60)
Glucose, Bld: 156 mg/dL — ABNORMAL HIGH (ref 70–99)
Potassium: 4.2 mmol/L (ref 3.5–5.1)
Sodium: 136 mmol/L (ref 135–145)

## 2023-01-09 LAB — MAGNESIUM: Magnesium: 1.6 mg/dL — ABNORMAL LOW (ref 1.7–2.4)

## 2023-01-09 NOTE — Progress Notes (Signed)
   01/09/23 1528  OBSTRUCTIVE SLEEP APNEA  Have you ever been diagnosed with sleep apnea through a sleep study? No  Do you snore loudly (loud enough to be heard through closed doors)?  1  Do you often feel tired, fatigued, or sleepy during the daytime (such as falling asleep during driving or talking to someone)? 1  Has anyone observed you stop breathing during your sleep? 1  Do you have, or are you being treated for high blood pressure? 1  BMI more than 35 kg/m2? 1  Age > 50 (1-yes) 1  Neck circumference greater than:Female 16 inches or larger, Female 17inches or larger? 0  Female Gender (Yes=1) 0  Obstructive Sleep Apnea Score 6  Score 5 or greater  Results sent to PCP

## 2023-01-11 ENCOUNTER — Ambulatory Visit (HOSPITAL_COMMUNITY)
Admission: RE | Admit: 2023-01-11 | Discharge: 2023-01-11 | Disposition: A | Discharge status: Home / Self Care | Payer: Managed Care, Other (non HMO) | Attending: Surgery | Admitting: Surgery

## 2023-01-11 ENCOUNTER — Other Ambulatory Visit: Payer: Self-pay

## 2023-01-11 ENCOUNTER — Encounter (HOSPITAL_COMMUNITY)
Admission: RE | Disposition: A | Discharge status: Home / Self Care | Payer: Self-pay | Source: Home / Self Care | Attending: Surgery

## 2023-01-11 ENCOUNTER — Ambulatory Visit (HOSPITAL_COMMUNITY): Payer: Managed Care, Other (non HMO) | Admitting: Certified Registered"

## 2023-01-11 ENCOUNTER — Ambulatory Visit (HOSPITAL_BASED_OUTPATIENT_CLINIC_OR_DEPARTMENT_OTHER): Payer: Managed Care, Other (non HMO) | Admitting: Certified Registered"

## 2023-01-11 DIAGNOSIS — I10 Essential (primary) hypertension: Secondary | ICD-10-CM | POA: Insufficient documentation

## 2023-01-11 DIAGNOSIS — F32A Depression, unspecified: Secondary | ICD-10-CM | POA: Diagnosis not present

## 2023-01-11 DIAGNOSIS — Z951 Presence of aortocoronary bypass graft: Secondary | ICD-10-CM | POA: Diagnosis not present

## 2023-01-11 DIAGNOSIS — K429 Umbilical hernia without obstruction or gangrene: Secondary | ICD-10-CM | POA: Diagnosis not present

## 2023-01-11 DIAGNOSIS — Z86718 Personal history of other venous thrombosis and embolism: Secondary | ICD-10-CM | POA: Diagnosis not present

## 2023-01-11 DIAGNOSIS — K219 Gastro-esophageal reflux disease without esophagitis: Secondary | ICD-10-CM | POA: Insufficient documentation

## 2023-01-11 DIAGNOSIS — Z7951 Long term (current) use of inhaled steroids: Secondary | ICD-10-CM | POA: Diagnosis not present

## 2023-01-11 DIAGNOSIS — I25119 Atherosclerotic heart disease of native coronary artery with unspecified angina pectoris: Secondary | ICD-10-CM | POA: Diagnosis not present

## 2023-01-11 DIAGNOSIS — K439 Ventral hernia without obstruction or gangrene: Secondary | ICD-10-CM

## 2023-01-11 DIAGNOSIS — J4489 Other specified chronic obstructive pulmonary disease: Secondary | ICD-10-CM | POA: Insufficient documentation

## 2023-01-11 DIAGNOSIS — E119 Type 2 diabetes mellitus without complications: Secondary | ICD-10-CM | POA: Diagnosis not present

## 2023-01-11 DIAGNOSIS — I252 Old myocardial infarction: Secondary | ICD-10-CM | POA: Insufficient documentation

## 2023-01-11 DIAGNOSIS — Z79899 Other long term (current) drug therapy: Secondary | ICD-10-CM | POA: Insufficient documentation

## 2023-01-11 DIAGNOSIS — G4730 Sleep apnea, unspecified: Secondary | ICD-10-CM | POA: Diagnosis not present

## 2023-01-11 DIAGNOSIS — Z87891 Personal history of nicotine dependence: Secondary | ICD-10-CM

## 2023-01-11 DIAGNOSIS — Z7984 Long term (current) use of oral hypoglycemic drugs: Secondary | ICD-10-CM | POA: Diagnosis not present

## 2023-01-11 DIAGNOSIS — Z853 Personal history of malignant neoplasm of breast: Secondary | ICD-10-CM | POA: Diagnosis not present

## 2023-01-11 DIAGNOSIS — F419 Anxiety disorder, unspecified: Secondary | ICD-10-CM | POA: Insufficient documentation

## 2023-01-11 DIAGNOSIS — M1990 Unspecified osteoarthritis, unspecified site: Secondary | ICD-10-CM | POA: Diagnosis not present

## 2023-01-11 HISTORY — PX: XI ROBOTIC ASSISTED VENTRAL HERNIA: SHX6789

## 2023-01-11 LAB — GLUCOSE, CAPILLARY: Glucose-Capillary: 166 mg/dL — ABNORMAL HIGH (ref 70–99)

## 2023-01-11 SURGERY — REPAIR, HERNIA, VENTRAL, ROBOT-ASSISTED
Anesthesia: General | Site: Abdomen

## 2023-01-11 MED ORDER — HYDROMORPHONE HCL 1 MG/ML IJ SOLN
0.2500 mg | INTRAMUSCULAR | Status: DC | PRN
  Administered 2023-01-11: 0.5 mg via INTRAVENOUS

## 2023-01-11 MED ORDER — SCOPOLAMINE 1 MG/3DAYS TD PT72
1.0000 | MEDICATED_PATCH | Freq: Once | TRANSDERMAL | Status: DC
  Administered 2023-01-11: 1.5 mg via TRANSDERMAL

## 2023-01-11 MED ORDER — OXYCODONE HCL 5 MG PO TABS: 5.0000 mg | ORAL_TABLET | Freq: Four times a day (QID) | ORAL | 0 refills | Status: DC | PRN

## 2023-01-11 MED ORDER — MIDAZOLAM HCL 2 MG/2ML IJ SOLN
INTRAMUSCULAR | Status: AC
  Filled 2023-01-11: qty 2

## 2023-01-11 MED ORDER — DEXAMETHASONE SODIUM PHOSPHATE 10 MG/ML IJ SOLN
INTRAMUSCULAR | Status: AC
  Filled 2023-01-11: qty 1

## 2023-01-11 MED ORDER — PHENYLEPHRINE 80 MCG/ML (10ML) SYRINGE FOR IV PUSH (FOR BLOOD PRESSURE SUPPORT)
PREFILLED_SYRINGE | INTRAVENOUS | Status: DC | PRN
  Administered 2023-01-11 (×2): 80 ug via INTRAVENOUS

## 2023-01-11 MED ORDER — LIDOCAINE 2% (20 MG/ML) 5 ML SYRINGE
INTRAMUSCULAR | Status: DC | PRN
  Administered 2023-01-11: 100 mg via INTRAVENOUS

## 2023-01-11 MED ORDER — PROPOFOL 10 MG/ML IV BOLUS
INTRAVENOUS | Status: AC
  Filled 2023-01-11: qty 20

## 2023-01-11 MED ORDER — ONDANSETRON HCL 4 MG/2ML IJ SOLN: 4.0000 mg | Freq: Once | INTRAMUSCULAR | Status: DC | PRN

## 2023-01-11 MED ORDER — DEXAMETHASONE SODIUM PHOSPHATE 4 MG/ML IJ SOLN
INTRAMUSCULAR | Status: DC | PRN
  Administered 2023-01-11: 5 mg via INTRAVENOUS

## 2023-01-11 MED ORDER — METOCLOPRAMIDE HCL 5 MG/ML IJ SOLN
INTRAMUSCULAR | Status: AC
  Filled 2023-01-11: qty 2

## 2023-01-11 MED ORDER — ROCURONIUM BROMIDE 10 MG/ML (PF) SYRINGE
PREFILLED_SYRINGE | INTRAVENOUS | Status: AC
  Filled 2023-01-11: qty 10

## 2023-01-11 MED ORDER — LIDOCAINE HCL (PF) 2 % IJ SOLN
INTRAMUSCULAR | Status: AC
  Filled 2023-01-11: qty 5

## 2023-01-11 MED ORDER — CLINDAMYCIN PHOSPHATE 900 MG/50ML IV SOLN
900.0000 mg | INTRAVENOUS | Status: AC
  Administered 2023-01-11: 900 mg via INTRAVENOUS
  Filled 2023-01-11: qty 50

## 2023-01-11 MED ORDER — CHLORHEXIDINE GLUCONATE 0.12 % MT SOLN
15.0000 mL | Freq: Once | OROMUCOSAL | Status: AC
  Administered 2023-01-11: 15 mL via OROMUCOSAL

## 2023-01-11 MED ORDER — DOCUSATE SODIUM 100 MG PO CAPS: 100.0000 mg | ORAL_CAPSULE | Freq: Two times a day (BID) | ORAL | 2 refills | Status: DC

## 2023-01-11 MED ORDER — BUPIVACAINE HCL (PF) 0.5 % IJ SOLN
INTRAMUSCULAR | Status: AC
  Filled 2023-01-11: qty 30

## 2023-01-11 MED ORDER — FENTANYL CITRATE (PF) 250 MCG/5ML IJ SOLN
INTRAMUSCULAR | Status: DC | PRN
  Administered 2023-01-11 (×2): 50 ug via INTRAVENOUS

## 2023-01-11 MED ORDER — PROPOFOL 10 MG/ML IV BOLUS
INTRAVENOUS | Status: DC | PRN
  Administered 2023-01-11: 160 mg via INTRAVENOUS

## 2023-01-11 MED ORDER — OXYCODONE-ACETAMINOPHEN 5-325 MG PO TABS
1.0000 | ORAL_TABLET | Freq: Once | ORAL | Status: AC
  Administered 2023-01-11: 1 via ORAL
  Filled 2023-01-11: qty 1

## 2023-01-11 MED ORDER — CHLORHEXIDINE GLUCONATE CLOTH 2 % EX PADS: 6.0000 | MEDICATED_PAD | Freq: Once | CUTANEOUS | Status: DC

## 2023-01-11 MED ORDER — ORAL CARE MOUTH RINSE: 15.0000 mL | Freq: Once | OROMUCOSAL | Status: AC

## 2023-01-11 MED ORDER — ONDANSETRON HCL 4 MG/2ML IJ SOLN
INTRAMUSCULAR | Status: DC | PRN
  Administered 2023-01-11: 4 mg via INTRAVENOUS

## 2023-01-11 MED ORDER — ONDANSETRON HCL 4 MG/2ML IJ SOLN
INTRAMUSCULAR | Status: AC
  Filled 2023-01-11: qty 2

## 2023-01-11 MED ORDER — HYDROMORPHONE HCL 1 MG/ML IJ SOLN
INTRAMUSCULAR | Status: AC
  Filled 2023-01-11: qty 0.5

## 2023-01-11 MED ORDER — FENTANYL CITRATE (PF) 100 MCG/2ML IJ SOLN
INTRAMUSCULAR | Status: AC
  Filled 2023-01-11: qty 2

## 2023-01-11 MED ORDER — METOCLOPRAMIDE HCL 5 MG/ML IJ SOLN
10.0000 mg | Freq: Once | INTRAMUSCULAR | Status: AC
  Administered 2023-01-11: 10 mg via INTRAVENOUS

## 2023-01-11 MED ORDER — MIDAZOLAM HCL 2 MG/2ML IJ SOLN
2.0000 mg | Freq: Once | INTRAMUSCULAR | Status: AC
  Administered 2023-01-11: 2 mg via INTRAVENOUS

## 2023-01-11 MED ORDER — MEPERIDINE HCL 50 MG/ML IJ SOLN: 6.2500 mg | INTRAMUSCULAR | Status: DC | PRN

## 2023-01-11 MED ORDER — ACETAMINOPHEN 500 MG PO TABS: 1000.0000 mg | ORAL_TABLET | Freq: Four times a day (QID) | ORAL | 0 refills | Status: AC

## 2023-01-11 MED ORDER — MIDAZOLAM HCL 2 MG/2ML IJ SOLN
INTRAMUSCULAR | Status: DC | PRN
  Administered 2023-01-11: 2 mg via INTRAVENOUS

## 2023-01-11 MED ORDER — STERILE WATER FOR IRRIGATION IR SOLN
Status: DC | PRN
  Administered 2023-01-11: 500 mL

## 2023-01-11 MED ORDER — ROCURONIUM BROMIDE 10 MG/ML (PF) SYRINGE
PREFILLED_SYRINGE | INTRAVENOUS | Status: DC | PRN
  Administered 2023-01-11: 50 mg via INTRAVENOUS
  Administered 2023-01-11 (×2): 10 mg via INTRAVENOUS

## 2023-01-11 MED ORDER — BUPIVACAINE HCL (PF) 0.5 % IJ SOLN
INTRAMUSCULAR | Status: DC | PRN
  Administered 2023-01-11: 30 mL

## 2023-01-11 MED ORDER — LACTATED RINGERS IV SOLN: INTRAVENOUS | Status: DC

## 2023-01-11 MED ORDER — ASPIRIN EC 81 MG PO TBEC: 81.0000 mg | DELAYED_RELEASE_TABLET | Freq: Every evening | ORAL | 11 refills | Status: DC

## 2023-01-11 MED ORDER — SUGAMMADEX SODIUM 200 MG/2ML IV SOLN
INTRAVENOUS | Status: DC | PRN
  Administered 2023-01-11: 200 mg via INTRAVENOUS

## 2023-01-11 MED ORDER — SCOPOLAMINE 1 MG/3DAYS TD PT72
MEDICATED_PATCH | TRANSDERMAL | Status: AC
  Filled 2023-01-11: qty 1

## 2023-01-11 SURGICAL SUPPLY — 52 items
ADH SKN CLS APL DERMABOND .7 (GAUZE/BANDAGES/DRESSINGS) ×1
APL PRP STRL LF DISP 70% ISPRP (MISCELLANEOUS) ×1
BLADE SURG 15 STRL LF DISP TIS (BLADE) ×1 IMPLANT
BLADE SURG 15 STRL SS (BLADE) ×1
CHLORAPREP W/TINT 26 (MISCELLANEOUS) ×1 IMPLANT
COVER LIGHT HANDLE STERIS (MISCELLANEOUS) ×2 IMPLANT
COVER MAYO STAND XLG (MISCELLANEOUS) ×1 IMPLANT
COVER TIP SHEARS 8 DVNC (MISCELLANEOUS) ×1 IMPLANT
COVER TIP SHEARS 8MM DA VINCI (MISCELLANEOUS) ×1
DEFOGGER SCOPE WARMER CLEARIFY (MISCELLANEOUS) IMPLANT
DERMABOND ADVANCED .7 DNX12 (GAUZE/BANDAGES/DRESSINGS) ×1 IMPLANT
DRAPE ARM DVNC X/XI (DISPOSABLE) ×3 IMPLANT
DRAPE COLUMN DVNC XI (DISPOSABLE) ×1 IMPLANT
DRAPE DA VINCI XI ARM (DISPOSABLE) ×3
DRAPE DA VINCI XI COLUMN (DISPOSABLE) ×1
DRSG TEGADERM 4X4.75 (GAUZE/BANDAGES/DRESSINGS) ×1 IMPLANT
ELECT REM PT RETURN 9FT ADLT (ELECTROSURGICAL) ×1
ELECTRODE REM PT RTRN 9FT ADLT (ELECTROSURGICAL) ×1 IMPLANT
GAUZE SPONGE 2X2 8PLY STRL LF (GAUZE/BANDAGES/DRESSINGS) ×2 IMPLANT
GLOVE BIOGEL PI IND STRL 6.5 (GLOVE) ×2 IMPLANT
GLOVE BIOGEL PI IND STRL 7.0 (GLOVE) ×2 IMPLANT
GLOVE SURG SS PI 6.5 STRL IVOR (GLOVE) ×2 IMPLANT
GOWN STRL REUS W/TWL LRG LVL3 (GOWN DISPOSABLE) ×3 IMPLANT
GRASPER SUT TROCAR 14GX15 (MISCELLANEOUS) IMPLANT
IRRIGATOR SUCT 8 DISP DVNC XI (IRRIGATION / IRRIGATOR) IMPLANT
IRRIGATOR SUCTION 8MM XI DISP (IRRIGATION / IRRIGATOR)
KIT TURNOVER KIT A (KITS) ×1 IMPLANT
MANIFOLD NEPTUNE II (INSTRUMENTS) ×1 IMPLANT
MESH VENTRALIGHT ST 4X6IN (Mesh General) IMPLANT
NDL HYPO 21X1.5 SAFETY (NEEDLE) ×1 IMPLANT
NDL INSUFFLATION 14GA 120MM (NEEDLE) ×1 IMPLANT
NEEDLE HYPO 21X1.5 SAFETY (NEEDLE) ×1 IMPLANT
NEEDLE INSUFFLATION 14GA 120MM (NEEDLE) ×1 IMPLANT
OBTURATOR OPTICAL STANDARD 8MM (TROCAR) ×1
OBTURATOR OPTICAL STND 8 DVNC (TROCAR) ×1
OBTURATOR OPTICALSTD 8 DVNC (TROCAR) ×1 IMPLANT
PACK LAP CHOLE LZT030E (CUSTOM PROCEDURE TRAY) ×1 IMPLANT
PENCIL HANDSWITCHING (ELECTRODE) ×1 IMPLANT
SEAL CANN UNIV 5-8 DVNC XI (MISCELLANEOUS) ×3 IMPLANT
SEAL XI 5MM-8MM UNIVERSAL (MISCELLANEOUS) ×3
SET BASIN LINEN APH (SET/KITS/TRAYS/PACK) ×1 IMPLANT
SET TUBE SMOKE EVAC HIGH FLOW (TUBING) ×1 IMPLANT
SUT MNCRL AB 4-0 PS2 18 (SUTURE) ×1 IMPLANT
SUT STRATAFIX 0 PDS+ CT-2 23 (SUTURE) ×1
SUT V-LOC 90 ABS 3-0 VLT  V-20 (SUTURE) ×4
SUT V-LOC 90 ABS 3-0 VLT V-20 (SUTURE) ×2 IMPLANT
SUTURE STRATFX 0 PDS+ CT-2 23 (SUTURE) ×1 IMPLANT
SYR 20ML LL LF (SYRINGE) ×1 IMPLANT
TAPE TRANSPORE STRL 2 31045 (GAUZE/BANDAGES/DRESSINGS) ×1 IMPLANT
TRAY FOLEY W/BAG SLVR 16FR (SET/KITS/TRAYS/PACK) ×1
TRAY FOLEY W/BAG SLVR 16FR ST (SET/KITS/TRAYS/PACK) ×1 IMPLANT
WATER STERILE IRR 500ML POUR (IV SOLUTION) ×1 IMPLANT

## 2023-01-11 NOTE — H&P (Signed)
[facility] History and Physical   Reason for Referral: Ventral hernia Referring Physician: ED referral   Chief Complaint   New Patient (Initial Visit)        Brenda Cruz is a 57 y.o. female.  HPI: Patient presents for evaluation of a ventral hernia.  It has been present for over a year and she denies ever having significant issues previously.  It was always able to be reduced.  2 weeks ago, she had worsening pain at her hernia site with episodes of nausea and vomiting.  She was also complaining of 'twisting cramps' in the sides of her abdomen.  While in the ED, she underwent a CT abdomen and pelvis which demonstrated a small fat-containing midline ventral hernia just superior to the umbilicus.  She was discharged home from the hospital at that time.  She was noted to be hypomagnesemic, and is currently receiving magnesium supplementation.  She did require previous magnesium infusions when she was being treated for breast cancer.  She believes her abdominal cramps are likely secondary to her low magnesium levels.  Her past medical history significant for hypertension, hyperlipidemia, diabetes, HIT, coronary artery disease status post CABG 5 years ago, and breast cancer.  Her surgical history significant for CABG 5 years ago, cholecystectomy, oophorectomy and salpingectomy, and left breast lumpectomy.  She denies use of blood thinning medications.  She has not seen a cardiologist in greater than 1 year.  She smokes 1 pack of cigarettes per day.  She denies use of alcohol and illicit drugs.       Past Medical History:  Diagnosis Date   Anxiety     Asthma     DDD (degenerative disc disease)     GERD (gastroesophageal reflux disease)     Hemorrhoids     HIT (heparin-induced thrombocytopenia) (HCC)     Hyperlipemia     Hypertension     Infiltrating ductal carcinoma of left breast ([place]) 12/15/2017   Iron deficiency anemia 11/23/2015   Pulmonary emboli ([place]) 12/07/2015    [facility] spotted fever     TMJ (temporomandibular joint disorder)     Type 2 diabetes mellitus ([place]) 12/15/2017           Past Surgical History:  Procedure Laterality Date   CARDIAC CATHETERIZATION N/A 10/29/2015    Procedure: Left Heart Cath and Coronary Angiography;  Surgeon: [name], MD;  Location: [facility];  Service: Cardiovascular;  Laterality: N/A;   CHOLECYSTECTOMY       CORONARY ARTERY BYPASS GRAFT N/A 11/02/2015    Procedure: CORONARY ARTERY BYPASS GRAFTING (CABG)x 1 using left internal mammary artery.;  Surgeon: Melrose Nakayama, MD;  Location: [facility];  Service: Open Heart Surgery;  Laterality: N/A;   LEFT OOPHORECTOMY Left      Related to tubal torsion.  Bilateral salpingectomy also.  Remaining uterus and right ovary   LUMBAR FUSION       TEE WITHOUT CARDIOVERSION N/A 11/02/2015    Procedure: TRANSESOPHAGEAL ECHOCARDIOGRAM (TEE);  Surgeon: Melrose Nakayama, MD;  Location: [facility];  Service: Open Heart Surgery;  Laterality: N/A;           Family History  Problem Relation Age of Onset   Colon polyps Father     Stomach cancer Paternal Grandmother     Esophageal cancer Maternal Grandfather     Breast cancer Mother     Ovarian cancer Other          Maternal  Side Great  Aunt   Colon cancer Neg Hx        Social History         Tobacco Use   Smoking status: Former   Smokeless tobacco: Never   Tobacco comments:      Counseling paper for smoking given to patient in exam room   Vaping Use   Vaping Use: Never used  Substance Use Topics   Alcohol use: No      Alcohol/week: 0.0 standard drinks of alcohol   Drug use: No      Medications: I have reviewed the patient's current medications. 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    Sulfa Antibiotics Itching, Rash, Other (See Comments)            Medication List           Accurate as of December 06, 2022 11:59 PM. If you have any questions, ask your nurse or doctor.              acetaminophen 500 MG tablet Commonly known as: TYLENOL Take 1,000 mg by mouth every 6 (six) hours as needed for mild pain.    albuterol 108 (90 Base) MCG/ACT inhaler Commonly known as: VENTOLIN HFA Inhale 2 puffs into the lungs every 4 (four) hours as needed for wheezing or shortness of breath.    amLODipine 5 MG tablet Commonly known as: NORVASC Take 1 tablet (5 mg total) by mouth daily.    aspirin EC 81 MG tablet Take 81 mg by mouth every evening.    blood glucose meter kit and supplies Dispense based on patient and insurance preference. Use up to four times daily as directed. (FOR ICD-10 E10.9, E11.9).    budesonide-formoterol 80-4.5 MCG/ACT inhaler Commonly known as: SYMBICORT Inhale 2 puffs into the lungs 2 (two) times daily.    calcium carbonate 1500 (600 Ca) MG Tabs tablet Commonly known as: OSCAL Take 600 mg of elemental calcium by mouth daily with breakfast.    cyclobenzaprine 10 MG tablet Commonly known as: FLEXERIL TAKE ONE TABLET BY MOUTH THREE TIMES DAILY AS NEEDED FOR  MUSCLE SPASM    diphenhydrAMINE 25 MG tablet Commonly known as: BENADRYL Take 25 mg by mouth every 6 (six) hours as needed for itching or allergies.    esomeprazole 40 MG capsule Commonly known as: NEXIUM Take 1 capsule (40 mg total) by mouth daily.    FLUoxetine 40 MG capsule Commonly known as: PROZAC Take 2 capsules (80  mg total) by mouth daily. TAKE ONE (1) CAPSULE EACH DAY    fluticasone 50 MCG/ACT nasal spray Commonly known as: FLONASE Place 2 sprays into both nostrils daily.    HYDROcodone-acetaminophen 5-325 MG tablet Commonly known as: NORCO/VICODIN Take 1 tablet by mouth every 4 (four) hours as needed.    ipratropium-albuterol 0.5-2.5 (3) MG/3ML Soln Commonly known as: DUONEB Take 3 mLs by nebulization every 6 (six) hours as needed (Foir shortness of breath).    LORazepam 0.5 MG tablet Commonly known as: ATIVAN Take 1 tablet (0.5 mg total) by mouth every 8 (eight) hours as needed for anxiety.    magnesium oxide 400 MG tablet Commonly known as: MAG-OX Take 1 tablet (400 mg total) by mouth daily. What changed:  how much to take when to take this    metFORMIN 1000 MG tablet Commonly known as: GLUCOPHAGE Take 1 tablet (1,000 mg total) by mouth 2 (two) times daily.    metoprolol tartrate 50 MG tablet Commonly known as: LOPRESSOR Take 1 tablet (50 mg total) by mouth 2 (two) times daily.    multivitamin with minerals Tabs tablet Take 1 tablet by mouth daily.    nitroGLYCERIN 0.4 MG SL tablet Commonly known as: NITROSTAT Place 1 tablet (0.4 mg total) under the tongue every 5 (five) minutes as needed for chest pain.    ondansetron 8 MG disintegrating tablet Commonly known as: ZOFRAN-ODT TAKE 1 TABLET BY MOUTH EVERY 8 HOURS AS NEEDED FOR NAUSEA & VOMITING    ondansetron 4 MG disintegrating tablet Commonly known as: ZOFRAN-ODT Take 1 tablet (4 mg total) by mouth every 8 (eight) hours as needed.    Rybelsus 3 MG Tabs Generic drug: Semaglutide Take 1 tablet  (3 mg total) by mouth daily.               ROS:  Constitutional: negative for chills, fatigue, and fevers Eyes: negative for visual disturbance and pain Ears, nose, mouth, throat, and face: positive for sinus problems, negative for ear drainage and sore throat Respiratory: positive for cough, negative for wheezing and shortness of breath Cardiovascular: positive for chest pain Gastrointestinal: positive for abdominal pain, nausea, and reflux symptoms Genitourinary:negative for dysuria and frequency Integument/breast: positive for dryness, negative for rash Hematologic/lymphatic: negative for bleeding and lymphadenopathy Musculoskeletal:positive for back pain and neck pain Neurological: negative for dizziness and tremors Endocrine: negative for temperature intolerance   Blood pressure (!) 148/88, pulse 77, temperature 98.1 F (36.7 C), temperature source Oral, resp. rate 14, height 5\' 7"  (1.702 m), weight 209 lb (94.8 kg), last menstrual period 06/03/2017, SpO2 99 %. Physical Exam Vitals reviewed.  Constitutional:      Appearance: Normal appearance.  HENT:     Head: Normocephalic and atraumatic.  Eyes:     Extraocular Movements: Extraocular movements intact.     Pupils: Pupils are equal, round, and reactive to light.  Cardiovascular:     Rate and Rhythm: Normal rate and regular rhythm.  Pulmonary:     Effort: Pulmonary effort is normal.     Breath sounds: Normal breath sounds.  Abdominal:     Comments: Abdomen soft, nondistended, no percussion tenderness, mild tenderness to palpation of the midline abdomen; no rigidity, guarding, rebound tenderness; unable to palpate a discrete hernia defect, but fullness noted in the midline, superior to the umbilicus  Musculoskeletal:        General: Normal range of motion.     Cervical back: Normal range of motion.  Skin:    General:  Skin is warm and dry.  Neurological:     General: No focal deficit present.     Mental Status: She is  alert and oriented to person, place, and time.  Psychiatric:        Mood and Affect: Mood normal.        Behavior: Behavior normal.        Results: CT abdomen and pelvis (11/27/2022): IMPRESSION: 1. Small fat containing midline ventral abdominal wall hernia just superior to the umbilicus. 2. Otherwise, no acute abnormality. 3.  Aortic Atherosclerosis (ICD10-I70.0).     Assessment & Plan:  JAREE NASIR is a 57 y.o. female who presents for evaluation of ventral hernia.  Imaging and blood work evaluated by myself   -I explained the pathophysiology of hernias, and why we recommend surgical repair -The risk and benefits of robotic assisted laparoscopic ventral hernia repair with mesh were discussed including but not limited to bleeding, infection, injury to surrounding structures, need for additional procedures, and hernia recurrence.  After careful consideration, NORRIE LEVANDOWSKI has decided to proceed with surgery. -Prior to surgery, I advised that the patient needs to follow-up with her cardiologist for cardiac clearance given her significant cardiovascular history -Patient is trying to establish care with hematology to assist with her magnesium levels -Information provided to the patient regarding ventral hernias.  Advised her to present to the emergency department if she begins to have worsening pain at the hernia, nausea, vomiting, and obstipation -After evaluation by cardiology, she was scheduled for surgery on 3/27   All questions were answered to the satisfaction of the patient.   Graciella Freer, DO [facility] [address] [place], [place] [address] [phone] (office)

## 2023-01-11 NOTE — Anesthesia Procedure Notes (Addendum)
Procedure Name: Intubation Date/Time: 01/11/2023 11:23 AM  Performed by: Orlie Dakin, CRNAPre-anesthesia Checklist: Patient identified, Emergency Drugs available, Suction available and Patient being monitored Patient Re-evaluated:Patient Re-evaluated prior to induction Oxygen Delivery Method: Circle system utilized Preoxygenation: Pre-oxygenation with 100% oxygen Induction Type: IV induction Ventilation: Mask ventilation without difficulty Laryngoscope Size: Glidescope and 3 Grade View: Grade I Tube type: Oral Tube size: 7.5 mm Number of attempts: 1 Airway Equipment and Method: Stylet and Video-laryngoscopy Placement Confirmation: ETT inserted through vocal cords under direct vision, positive ETCO2 and breath sounds checked- equal and bilateral Secured at: 22 cm Tube secured with: Tape Dental Injury: Teeth and Oropharynx as per pre-operative assessment  Comments: Glidescope used due to H/O  TMJ and jaw "popping"'.  Small mouth, recessed  mandible noted in Pre-op.

## 2023-01-11 NOTE — Anesthesia Preprocedure Evaluation (Addendum)
Anesthesia Evaluation  Patient identified by MRN, date of birth, ID band Patient awake    Reviewed: Allergy & Precautions, H&P , NPO status , Patient's Chart, lab work & pertinent test results, reviewed documented beta blocker date and time   History of Anesthesia Complications (+) PONV and history of anesthetic complications  Airway Mallampati: III  TM Distance: >3 FB Neck ROM: Full   Comment: TMJ disorder Dental  (+) Dental Advisory Given, Teeth Intact   Pulmonary asthma , sleep apnea (snoring and apneic episodes during night, not on CPAP) , COPD,  COPD inhaler, Patient abstained from smoking., former smoker, PE   Pulmonary exam normal breath sounds clear to auscultation       Cardiovascular hypertension, Pt. on medications and Pt. on home beta blockers + angina  + CAD, + Past MI, + CABG and + DVT  Normal cardiovascular exam Rhythm:Regular Rate:Normal  The study is normal. The study is low risk.   No ST deviation was noted.   LV perfusion is normal. There is no evidence of ischemia. There is no evidence of infarction.   Left ventricular function is normal. Nuclear stress EF: 62 %. The left ventricular ejection fraction is normal (55-65%). End diastolic cavity size is normal. End systolic cavity size is normal.   Prior study available for comparison from 06/28/2019.    Neuro/Psych  PSYCHIATRIC DISORDERS Anxiety Depression    negative neurological ROS     GI/Hepatic Neg liver ROS,GERD  Medicated,,  Endo/Other  diabetes, Well Controlled, Type 2, Oral Hypoglycemic Agents    Renal/GU negative Renal ROS  negative genitourinary   Musculoskeletal  (+) Arthritis ,    Abdominal   Peds negative pediatric ROS (+)  Hematology  (+) Blood dyscrasia (HIT), anemia   Anesthesia Other Findings   Reproductive/Obstetrics negative OB ROS                             Anesthesia Physical Anesthesia  Plan  ASA: 3  Anesthesia Plan: General   Post-op Pain Management: Dilaudid IV   Induction: Intravenous  PONV Risk Score and Plan: 4 or greater and Ondansetron, Dexamethasone, Midazolam, Scopolamine patch - Pre-op and Metaclopromide  Airway Management Planned: Oral ETT and Video Laryngoscope Planned  Additional Equipment:   Intra-op Plan:   Post-operative Plan: Extubation in OR  Informed Consent: I have reviewed the patients History and Physical, chart, labs and discussed the procedure including the risks, benefits and alternatives for the proposed anesthesia with the patient or authorized representative who has indicated his/her understanding and acceptance.     Dental advisory given  Plan Discussed with: CRNA and Surgeon  Anesthesia Plan Comments:        Anesthesia Quick Evaluation

## 2023-01-11 NOTE — Anesthesia Postprocedure Evaluation (Signed)
Anesthesia Post Note  Patient: Brenda Cruz  Procedure(s) Performed: XI ROBOTIC ASSISTED VENTRAL HERNIA REPAIR W/ MESH- SUPRAUMBILICAL (Abdomen)  Patient location during evaluation: Phase II Anesthesia Type: General Level of consciousness: awake and alert and oriented Pain management: pain level controlled Vital Signs Assessment: post-procedure vital signs reviewed and stable Respiratory status: spontaneous breathing, nonlabored ventilation and respiratory function stable Cardiovascular status: blood pressure returned to baseline and stable Postop Assessment: no apparent nausea or vomiting Anesthetic complications: no  No notable events documented.   Last Vitals:  Vitals:   01/11/23 1430 01/11/23 1457  BP: (!) 145/71 138/75  Pulse: 84 81  Resp: 16   Temp:  (!) 36.3 C  SpO2: 90% 95%    Last Pain:  Vitals:   01/11/23 1457  TempSrc: Oral  PainSc: 7                   Vazguez C Izyan Ezzell

## 2023-01-11 NOTE — Op Note (Signed)
[facility] Operative Note  01/11/23  Preoperative Diagnosis: Supraumbilical hernia   Postoperative Diagnosis: Supraumbilical hernia and umbilical hernia   Procedure(s) Performed: Robotic assisted laparoscopic ventral hernia with mesh, total defect size 5 cm   Surgeon: Graciella Freer, DO   Assistants: No qualified resident was available    Anesthesia: General endotracheal   Anesthesiologist: Denese Killings, MD    Specimens: None   Estimated Blood Loss: Minimal   Blood Replacement: None    Complications: None   Wound Class: Clean   Operative Indications: Patient is a 57 year old female who presents for ventral hernia repair with mesh.  She has had this hernia present for over a year, but it started increasing in size and causing her more pain.  She is agreeable to surgery at this time.  All risks and benefits of performing this procedure were discussed with the patient including pain, infection, bleeding, damage to the surrounding structures, and need for more procedures or surgery. The patient voiced understanding of the procedure, all questions were sought and answered, and consent was obtained.  Findings: Multiple small defects, 3 different 1-2 cm defects in supraumbilical region, 1 cm umbilical defect. 5 cm of total defect size Tension free repair achieved with Ventralight 6 x 4 inch trimmed mesh and suture Adequate hemostasis    Procedure: The patient was brought to the operating room and general anesthesia was induced. A time-out was completed verifying correct patient, procedure, site, positioning, and implant(s) and/or special equipment prior to beginning this procedure. Antibiotics were administered prior to making the incision. SCDs placed. The anterior abdominal wall was prepped and draped in the standard sterile fashion.   A stab incision was made at Palmer's point. A towel clip was used to elevate the abdominal wall. A Veress needle  placed and the saline drop test was performed. The abdomen was insufflated to 15cm without issues. Optiview technique was used to place a port in the left upper quadrant at Palmer's point.  Lateral ports on the left side were placed using the optiview ports, placing the 63mm central camera port and 8 mm left lower quadrant port with at least 8 cm between all ports. The 4 x 6 inch trimmed Ventralight mesh and sutures were placed in the abdominal cavity under direct visualization. The Rosa Sanchez robot then docked into place.  Adhesions to the anterior abdominal wall were first taken down. The patient had multiple defects noted.  She had 3 different 1-2 cm defects in the supraumbilical region and a 1 cm umbilical hernia.  The total size of all defects measured 5 cm total. The hernia contents noted and reduced with combination of blunt, sharp dissection with scissors and fenestrated forceps.  Hemostasis achieved throughout this portion.  Once all hernia contents reduced, there was noted to be a total of 5 cm of hernia defect.  Any fat overlying the peritoneal layer was taken down creating flaps to clear the space for adequate fixation of the mesh.   A transfacial suture with 0 stratafix used to primarily close defect under minimal tension. The stratafix was then passed through the center of the mesh (coated side out) to secured the mesh to the abdominal wall centered over the defect.  The mesh was then circumferentially sutured into the anterior abdominal wall using 3-0 VLock x3.  Any bleeding noted during this portion was no longer actively bleeding by end of securing mesh and tightening the suture.    The robot was undocked.  The  ports were removed under direct visualization.  The abdomen was then de-sufflated and the mesh was noted to lay flat inside the abdomen.  All skin incisions closed with subcuticular 4-0 Monocryl and dermabond. Final inspection revealed acceptable hemostasis.   All counts were correct at the end  of the case. The patient was awakened from anesthesia and extubated without complication.  The patient went to the PACU in stable condition.   Graciella Freer, DO [facility] [address] [place], [place] [address] [phone] (office)

## 2023-01-11 NOTE — Transfer of Care (Signed)
Immediate Anesthesia Transfer of Care Note  Patient: Brenda Cruz  Procedure(s) Performed: XI ROBOTIC ASSISTED VENTRAL HERNIA REPAIR W/ MESH- SUPRAUMBILICAL (Abdomen)  Patient Location: PACU  Anesthesia Type:General  Level of Consciousness: awake  Airway & Oxygen Therapy: Patient Spontanous Breathing and Patient connected to face mask oxygen  Post-op Assessment: Report given to RN and Post -op Vital signs reviewed and stable  Post vital signs: Reviewed and stable  Last Vitals:  Vitals Value Taken Time  BP    Temp    Pulse    Resp    SpO2      Last Pain:  Vitals:   01/11/23 0952  PainSc: 0-No pain         Complications: No notable events documented.

## 2023-01-11 NOTE — Discharge Instructions (Signed)
Ambulatory Surgery Discharge Instructions  General Anesthesia or Sedation Do not drive or operate heavy machinery for 24 hours.  Do not consume alcohol, tranquilizers, sleeping medications, or any non-prescribed medications for 24 hours. Do not make important decisions or sign any important papers in the next 24 hours. You should have someone with you tonight at home.  Activity  You are advised to go directly home from the hospital.  Restrict your activities and rest for a day.  Resume light activity tomorrow. No heavy lifting over 10 lbs or strenuous exercise. Wear your abdominal binder at all times except while sleeping and bathing for the next 1-2 weeks.  Fluids and Diet Begin with clear liquids, bouillon, dry toast, soda crackers.  If not nauseated, you may go to a regular diet when you desire.  Greasy and spicy foods are not advised.  Medications  If you have not had a bowel movement in 24 hours, take 2 tablespoons over the counter Milk of mag.             You May resume your blood thinners tomorrow (Aspirin, coumadin, or other).  You are being discharged with prescriptions for Opioid/Narcotic Medications: There are some specific considerations for these medications that you should know. Opioid Meds have risks & benefits. Addiction to these meds is always a concern with prolonged use Take medication only as directed Do not drive while taking narcotic pain medication Do not crush tablets or capsules Do not use a different container than medication was dispensed in Lock the container of medication in a cool, dry place out of reach of children and pets. Opioid medication can cause addiction Do not share with anyone else (this is a felony) Do not store medications for future use. Dispose of them properly.     Disposal:  Find a [employer] household drug take back site near you.  If you can't get to a drug take back site, use the recipe below as a last resort to dispose of expired,  unused or unwanted drugs. Disposal  (Do not dispose chemotherapy drugs this way, talk to your prescribing doctor instead.) Step 1: Mix drugs (do not crush) with dirt, kitty litter, or used coffee grounds and add a small amount of water to dissolve any solid medications. Step 2: Seal drugs in plastic bag. Step 3: Place plastic bag in trash. Step 4: Take prescription container and scratch out personal information, then recycle or throw away.  Operative Site  You have a liquid bandage over your incisions, this will begin to flake off in about a week. Ok to Games developer. Keep wound clean and dry. No baths or swimming. No lifting more than 10 pounds.  Contact Information: If you have questions or concerns, please call our office, [phone], Monday- Thursday 8AM-5PM and Friday 8AM-12Noon.  If it is after hours or on the weekend, please call [facility] Main Number, [phone], and ask to speak to the surgeon on call for Dr. Okey Dupre at [facility].   SPECIFIC COMPLICATIONS TO WATCH FOR: Inability to urinate Fever over 101? F by mouth Nausea and vomiting lasting longer than 24 hours. Pain not relieved by medication ordered Swelling around the operative site Increased redness, warmth, hardness, around operative area Numbness, tingling, or cold fingers or toes Blood -soaked dressing, (small amounts of oozing may be normal) Increasing and progressive drainage from surgical area or exam site

## 2023-01-11 NOTE — Progress Notes (Signed)
[facility]  Spoke with the patient's husband in the consultation room.  I explained that she tolerated the procedure without difficulty.  She has dissolvable stitches under the skin with overlying skin glue.  This will flake off in 10 to 14 days.  I discharged her home with a prescription for narcotic pain medication that they should take as needed for pain.  I also want her taking scheduled Tylenol.  If they take the narcotic pain medication, they should take a stool softener as well.  The patient will follow-up with me in 2 weeks.  All questions were answered to his expressed satisfaction.  Teodoro Jeffreys, DO [facility] [address] [place], [place] [address] [phone] (office)  

## 2023-01-17 ENCOUNTER — Telehealth: Payer: Self-pay | Admitting: *Deleted

## 2023-01-17 MED ORDER — FLUCONAZOLE 150 MG PO TABS: 150.0000 mg | ORAL_TABLET | Freq: Every day | ORAL | 0 refills | Status: DC

## 2023-01-17 NOTE — Telephone Encounter (Signed)
Surgical Date: 01/11/2023 Procedure: XI ROBOTIC ASSISTED VENTRAL HERNIA REPAIR W/ MESH- SUPRAUMBILICAL   Received call from patient [phone]~ telephone.   Reports that she has thick white film on mouth and tongue. States that she has burning pain to tongue, especially when she attempts to eat or drink.   Also reports thick white discharge to vagina and itching to vulva. States that yeast overgrowth has been going on since after surgery.   Reports that she has been trying to eat more yogurt, and taking oral probiotic. Reports no relief from symptoms.   Requested oral diflucan. Please advise.

## 2023-01-17 NOTE — Telephone Encounter (Signed)
FYI: Patient also reports that she was not given abdominal binder from hospital after hernia surgery. States that she has been using abdominal binder for weight loss that has compression instead. States that she just wanted provider to be aware.

## 2023-01-17 NOTE — Telephone Encounter (Signed)
Discussed with Dr. Okey Dupre. Agreeable to Diflucan.   Prescription sent to pharmacy.

## 2023-01-20 ENCOUNTER — Encounter (HOSPITAL_COMMUNITY): Payer: Self-pay | Admitting: Surgery

## 2023-01-26 ENCOUNTER — Encounter: Payer: Managed Care, Other (non HMO) | Admitting: Surgery

## 2023-01-31 ENCOUNTER — Encounter: Payer: Managed Care, Other (non HMO) | Admitting: Surgery

## 2023-02-02 ENCOUNTER — Encounter: Payer: Self-pay | Admitting: Pharmacist

## 2023-02-08 ENCOUNTER — Ambulatory Visit (HOSPITAL_COMMUNITY): Payer: Managed Care, Other (non HMO)

## 2023-03-02 ENCOUNTER — Ambulatory Visit: Payer: Managed Care, Other (non HMO) | Admitting: Nurse Practitioner

## 2023-03-06 NOTE — Telephone Encounter (Signed)
Erroneous encounter will close.

## 2023-03-09 ENCOUNTER — Emergency Department (HOSPITAL_COMMUNITY): Payer: BC Managed Care – PPO

## 2023-03-09 ENCOUNTER — Ambulatory Visit (INDEPENDENT_AMBULATORY_CARE_PROVIDER_SITE_OTHER): Payer: BC Managed Care – PPO

## 2023-03-09 ENCOUNTER — Encounter (HOSPITAL_COMMUNITY): Payer: Self-pay | Admitting: Emergency Medicine

## 2023-03-09 ENCOUNTER — Telehealth: Payer: Self-pay | Admitting: Nurse Practitioner

## 2023-03-09 ENCOUNTER — Ambulatory Visit: Payer: BC Managed Care – PPO | Admitting: Family Medicine

## 2023-03-09 ENCOUNTER — Emergency Department (HOSPITAL_COMMUNITY)
Admission: EM | Admit: 2023-03-09 | Discharge: 2023-03-09 | Disposition: A | Discharge status: Home / Self Care | Payer: BC Managed Care – PPO | Attending: Emergency Medicine | Admitting: Emergency Medicine

## 2023-03-09 ENCOUNTER — Other Ambulatory Visit: Payer: Self-pay

## 2023-03-09 ENCOUNTER — Encounter: Payer: Self-pay | Admitting: Family Medicine

## 2023-03-09 VITALS — BP 150/87 | HR 101 | Temp 96.9°F | Ht 68.0 in | Wt 204.2 lb

## 2023-03-09 DIAGNOSIS — Z7982 Long term (current) use of aspirin: Secondary | ICD-10-CM | POA: Diagnosis not present

## 2023-03-09 DIAGNOSIS — R059 Cough, unspecified: Secondary | ICD-10-CM | POA: Diagnosis not present

## 2023-03-09 DIAGNOSIS — E119 Type 2 diabetes mellitus without complications: Secondary | ICD-10-CM | POA: Insufficient documentation

## 2023-03-09 DIAGNOSIS — Z79899 Other long term (current) drug therapy: Secondary | ICD-10-CM | POA: Diagnosis not present

## 2023-03-09 DIAGNOSIS — R0602 Shortness of breath: Secondary | ICD-10-CM | POA: Diagnosis not present

## 2023-03-09 DIAGNOSIS — Z1152 Encounter for screening for COVID-19: Secondary | ICD-10-CM | POA: Diagnosis not present

## 2023-03-09 DIAGNOSIS — R051 Acute cough: Secondary | ICD-10-CM | POA: Diagnosis not present

## 2023-03-09 DIAGNOSIS — Z7984 Long term (current) use of oral hypoglycemic drugs: Secondary | ICD-10-CM | POA: Diagnosis not present

## 2023-03-09 DIAGNOSIS — R61 Generalized hyperhidrosis: Secondary | ICD-10-CM | POA: Insufficient documentation

## 2023-03-09 DIAGNOSIS — J441 Chronic obstructive pulmonary disease with (acute) exacerbation: Secondary | ICD-10-CM | POA: Insufficient documentation

## 2023-03-09 DIAGNOSIS — I2510 Atherosclerotic heart disease of native coronary artery without angina pectoris: Secondary | ICD-10-CM | POA: Insufficient documentation

## 2023-03-09 DIAGNOSIS — Z7951 Long term (current) use of inhaled steroids: Secondary | ICD-10-CM | POA: Insufficient documentation

## 2023-03-09 DIAGNOSIS — I10 Essential (primary) hypertension: Secondary | ICD-10-CM | POA: Diagnosis not present

## 2023-03-09 DIAGNOSIS — Z951 Presence of aortocoronary bypass graft: Secondary | ICD-10-CM | POA: Diagnosis not present

## 2023-03-09 LAB — CBC WITH DIFFERENTIAL/PLATELET
Abs Immature Granulocytes: 0.03 K/uL (ref 0.00–0.07)
Basophils Absolute: 0 K/uL (ref 0.0–0.1)
Basophils Relative: 1 %
Eosinophils Absolute: 0.1 K/uL (ref 0.0–0.5)
Eosinophils Relative: 2 %
HCT: 37.3 % (ref 36.0–46.0)
Hemoglobin: 12.4 g/dL (ref 12.0–15.0)
Immature Granulocytes: 1 %
Lymphocytes Relative: 28 %
Lymphs Abs: 1.8 K/uL (ref 0.7–4.0)
MCH: 30.2 pg (ref 26.0–34.0)
MCHC: 33.2 g/dL (ref 30.0–36.0)
MCV: 90.8 fL (ref 80.0–100.0)
Monocytes Absolute: 0.7 K/uL (ref 0.1–1.0)
Monocytes Relative: 11 %
Neutro Abs: 3.6 K/uL (ref 1.7–7.7)
Neutrophils Relative %: 57 %
Platelets: 208 K/uL (ref 150–400)
RBC: 4.11 MIL/uL (ref 3.87–5.11)
RDW: 12.8 % (ref 11.5–15.5)
WBC: 6.3 K/uL (ref 4.0–10.5)
nRBC: 0 % (ref 0.0–0.2)

## 2023-03-09 LAB — BASIC METABOLIC PANEL
Anion gap: 12 (ref 5–15)
BUN: 10 mg/dL (ref 6–20)
CO2: 26 mmol/L (ref 22–32)
Calcium: 8.9 mg/dL (ref 8.9–10.3)
Chloride: 98 mmol/L (ref 98–111)
Creatinine, Ser: 1.26 mg/dL — ABNORMAL HIGH (ref 0.44–1.00)
GFR, Estimated: 51 mL/min — ABNORMAL LOW (ref >60)
Glucose, Bld: 176 mg/dL — ABNORMAL HIGH (ref 70–99)
Potassium: 3.3 mmol/L — ABNORMAL LOW (ref 3.5–5.1)
Sodium: 136 mmol/L (ref 135–145)

## 2023-03-09 LAB — VERITOR FLU A/B WAIVED
Influenza A: NEGATIVE
Influenza B: NEGATIVE

## 2023-03-09 LAB — BLOOD GAS, VENOUS
Acid-Base Excess: 5.8 mmol/L — ABNORMAL HIGH (ref 0.0–2.0)
Bicarbonate: 29.8 mmol/L — ABNORMAL HIGH (ref 20.0–28.0)
Drawn by: 1528
O2 Saturation: 76.7 %
Patient temperature: 37.3
pCO2, Ven: 40 mmHg — ABNORMAL LOW (ref 44–60)
pH, Ven: 7.48 — ABNORMAL HIGH (ref 7.25–7.43)
pO2, Ven: 42 mmHg (ref 32–45)

## 2023-03-09 LAB — SARS CORONAVIRUS 2 BY RT PCR: SARS Coronavirus 2 by RT PCR: NEGATIVE

## 2023-03-09 MED ORDER — IPRATROPIUM BROMIDE 0.02 % IN SOLN
0.5000 mg | Freq: Once | RESPIRATORY_TRACT | Status: AC
  Administered 2023-03-09: 0.5 mg via RESPIRATORY_TRACT

## 2023-03-09 MED ORDER — ALBUTEROL SULFATE (2.5 MG/3ML) 0.083% IN NEBU
2.5000 mg | INHALATION_SOLUTION | Freq: Once | RESPIRATORY_TRACT | Status: AC
  Administered 2023-03-09: 2.5 mg via RESPIRATORY_TRACT

## 2023-03-09 MED ORDER — IPRATROPIUM BROMIDE 0.02 % IN SOLN
0.5000 mg | Freq: Once | RESPIRATORY_TRACT | Status: AC
  Administered 2023-03-09: 0.5 mg via RESPIRATORY_TRACT
  Filled 2023-03-09: qty 2.5

## 2023-03-09 MED ORDER — ALBUTEROL SULFATE (2.5 MG/3ML) 0.083% IN NEBU
10.0000 mg/h | INHALATION_SOLUTION | Freq: Once | RESPIRATORY_TRACT | Status: AC
  Administered 2023-03-09: 10 mg/h via RESPIRATORY_TRACT
  Filled 2023-03-09: qty 12

## 2023-03-09 MED ORDER — METHYLPREDNISOLONE ACETATE 40 MG/ML IJ SUSP
40.0000 mg | Freq: Once | INTRAMUSCULAR | Status: AC
  Administered 2023-03-09: 60 mg via INTRAMUSCULAR

## 2023-03-09 MED ORDER — ALBUTEROL SULFATE HFA 108 (90 BASE) MCG/ACT IN AERS: 2.0000 | INHALATION_SPRAY | RESPIRATORY_TRACT | Status: DC | PRN

## 2023-03-09 MED ORDER — PREDNISONE 10 MG PO TABS: 40.0000 mg | ORAL_TABLET | Freq: Every day | ORAL | 0 refills | Status: DC

## 2023-03-09 NOTE — ED Notes (Signed)
Resp therapist called & notified of orders.

## 2023-03-09 NOTE — ED Notes (Signed)
EDP & myself spoke at length about pt being admitted for observation.  Pt insisted that she was okay to go home & that she would return if she felt like it was more difficult to breath or if she experienced any chest tightness.

## 2023-03-09 NOTE — ED Notes (Signed)
Pt is complaining of feeling like "she is going to crawl out of her skin." That she feels very anxious after neb treatment. Provided verbal reassurance to pt & EDP made aware.

## 2023-03-09 NOTE — ED Provider Notes (Signed)
[place] EMERGENCY DEPARTMENT AT [facility] Provider Note   CSN: [id number] Arrival date & time: 03/09/23  1936     History  Chief Complaint  Patient presents with   Shortness of Breath    Brenda Cruz is a 57 y.o. female.  HPI    56 year old female comes in with chief complaint of shortness of breath.  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  Patient states that she started getting sick earlier this week.  Patient started developing chest congestion, sore throat, fevers, chills, sweats, shortness of breath and exertional shortness of breath.  She also has chest tightness.  She went to see her PCP.  They noted that patient was wheezing and was tight.  She received DuoNeb and they requested that she go to the emergency room.  They gave her dexamethasone shot.  Patient instead elected to go home.  At home she still felt short of breath, therefore she ultimately decided to come to the emergency room.  Patient's husband is at the bedside.  Patient's Tmax was 101.5 yesterday.  She denies any new leg swelling.  Patient had PE post CABG.  She indicates that she does not really recall what symptoms she was having with PE, but she was having chest pain and difficulty laying flat.  She is currently not on any blood thinners.  Husband at the bedside.  States that patient also has an element of anxiety.  Patient denies any productive cough.  She denies any hemoptysis.  Home Medications Prior to Admission medications   Medication Sig Start Date End Date Taking? Authorizing Provider  albuterol (VENTOLIN HFA) 108 (90 Base) MCG/ACT inhaler Inhale 2 puffs into the lungs every 4 (four) hours as needed for wheezing or shortness of breath. 12/30/19  Yes Remus Loffler, PA-C  amLODipine (NORVASC) 5 MG tablet Take 1 tablet (5 mg total) by mouth daily. 08/29/22  Yes Daphine Deutscher, [name],  [name]  aspirin EC 81 MG tablet Take 1 tablet (81 mg total) by mouth every evening. 01/13/23  Yes Pappayliou, Santina Evans A, DO  Calcium Carb-Cholecalciferol (CALCIUM 600 + D PO) Take 1 tablet by mouth daily.   Yes [provider]  cyclobenzaprine (FLEXERIL) 10 MG tablet TAKE ONE TABLET BY MOUTH THREE TIMES DAILY AS NEEDED FOR MUSCLE SPASM 11/28/22  Yes Daphine Deutscher, [name], [name]  diphenhydrAMINE (BENADRYL) 25 MG tablet Take 25 mg by mouth every 6 (six) hours as needed for itching or allergies.   Yes [provider]  esomeprazole (NEXIUM) 40 MG capsule Take 1 capsule (40 mg total) by mouth daily. 08/29/22  Yes Martin, [name], [name]  FLUoxetine (PROZAC) 40 MG capsule Take 2 capsules (80 mg total) by mouth daily. TAKE ONE (1) CAPSULE EACH DAY Patient taking differently: Take 40 mg by mouth daily. 08/29/22  Yes Martin, [name], [name]  fluticasone (FLONASE) 50 MCG/ACT nasal spray Place 2 sprays into both nostrils daily. Patient taking differently: Place 2 sprays into both nostrils daily as needed for allergies. 09/28/22  Yes Daryll Drown, NP  ibuprofen (ADVIL) 200 MG tablet Take 400 mg by mouth every 6 (six) hours as needed for moderate pain.   Yes [provider]  ipratropium-albuterol (DUONEB) 0.5-2.5 (3) MG/3ML SOLN Take 3 mLs by nebulization every 6 (six) hours as needed (Foir shortness of breath). 11/18/22  Yes Martin, [name], [name]  LORazepam (ATIVAN) 0.5 MG tablet Take 1 tablet (0.5 mg  total) by mouth every 8 (eight) hours as needed for anxiety. 11/29/22  Yes Martin, [name], [name]  magnesium oxide (MAG-OX) 400 MG tablet Take 1 tablet (400 mg total) by mouth daily. Patient taking differently: Take 800 mg by mouth 2 (two) times daily. 11/29/22  Yes Martin, [name], [name]  MELATONIN PO Take 1 tablet by mouth at bedtime as needed (sleep).   Yes [provider]  metFORMIN (GLUCOPHAGE) 1000 MG tablet Take 1 tablet (1,000 mg total) by mouth 2 (two)  times daily. 08/29/22  Yes Daphine Deutscher, [name], [name]  nitroGLYCERIN (NITROSTAT) 0.4 MG SL tablet Place 1 tablet (0.4 mg total) under the tongue every 5 (five) minutes as needed for chest pain. 06/19/19 03/09/23 Yes Duke, Roe Rutherford, PA  ondansetron (ZOFRAN-ODT) 4 MG disintegrating tablet Take 1 tablet (4 mg total) by mouth every 8 (eight) hours as needed. 11/27/22  Yes Jacalyn Lefevre, MD  predniSONE (DELTASONE) 10 MG tablet Take 4 tablets (40 mg total) by mouth daily. 03/09/23  Yes Derwood Kaplan, MD  blood glucose meter kit and supplies Dispense based on patient and insurance preference. Use up to four times daily as directed. (FOR ICD-10 E10.9, E11.9). 11/04/19   Daphine Deutscher [name], [name]  Continuous Blood Gluc Sensor (FREESTYLE LIBRE 3 SENSOR) MISC Place 1 sensor on the skin every 14 days. Use to check glucose continuously 12/27/22   Malena Peer D, RPH-CPP  fluconazole (DIFLUCAN) 150 MG tablet Take 1 tablet (150 mg total) by mouth daily. May repeat dosage in (3) days. Patient not taking: Reported on 03/09/2023 01/17/23   Pappayliou, Santina Evans A, DO  metoprolol tartrate (LOPRESSOR) 50 MG tablet Take 1 tablet (50 mg total) by mouth 2 (two) times daily. 08/29/22   Daphine Deutscher, [name], [name]  oxyCODONE (ROXICODONE) 5 MG immediate release tablet Take 1 tablet (5 mg total) by mouth every 6 (six) hours as needed. Patient not taking: Reported on 03/09/2023 01/11/23   Pappayliou, Santina Evans A, DO  rosuvastatin (CRESTOR) 10 MG tablet Take 1 tablet (10 mg total) by mouth daily. Patient not taking: Reported on 03/09/2023 12/15/22   Runell Gess, MD  Semaglutide (RYBELSUS) 3 MG TABS Take 3 mg by mouth daily. Patient not taking: Reported on 03/09/2023    [provider]  Semaglutide,0.25 or 0.5MG /DOS, 2 MG/3ML SOPN Inject 0.25mg  once a week for 4 weeks and then increase to 0.5mg  weekly 12/21/22   Runell Gess, MD      Allergies    Atorvastatin, Iohexol, Ace inhibitors, Farxiga [dapagliflozin],  Heparin, Iodinated contrast media, Penicillins, and Sulfa antibiotics    Review of Systems   Review of Systems  All other systems reviewed and are negative.   Physical Exam Updated Vital Signs BP (!) 142/91   Pulse (!) 118   Temp 98.4 F (36.9 C) (Oral)   Resp 20   Ht 5\' 8"  (1.727 m)   Wt 92.5 kg   LMP 06/03/2017 (Exact Date)   SpO2 98%   BMI 31.02 kg/m  Physical Exam Vitals and nursing note reviewed.  Constitutional:      Appearance: She is well-developed.  HENT:     Head: Atraumatic.  Cardiovascular:     Rate and Rhythm: Normal rate.  Pulmonary:     Effort: Pulmonary effort is normal.     Breath sounds: Examination of the right-upper field reveals wheezing. Examination of the left-upper field reveals wheezing. Examination of the right-middle field reveals wheezing. Examination of the left-middle field reveals wheezing. Examination of the right-lower field reveals wheezing. Examination  of the left-lower field reveals wheezing. Decreased breath sounds and wheezing present. No rales.  Musculoskeletal:     Cervical back: Normal range of motion and neck supple.  Skin:    General: Skin is warm and dry.  Neurological:     Mental Status: She is alert and oriented to person, place, and time.     ED Results / Procedures / Treatments   Labs (all labs ordered are listed, but only abnormal results are displayed) Labs Reviewed  BASIC METABOLIC PANEL - Abnormal; Notable for the following components:      Result Value   Potassium 3.3 (*)    Glucose, Bld 176 (*)    Creatinine, Ser 1.26 (*)    GFR, Estimated 51 (*)    All other components within normal limits  BLOOD GAS, VENOUS - Abnormal; Notable for the following components:   pH, Ven 7.48 (*)    pCO2, Ven 40 (*)    Bicarbonate 29.8 (*)    Acid-Base Excess 5.8 (*)    All other components within normal limits  SARS CORONAVIRUS 2 BY RT PCR  CBC WITH DIFFERENTIAL/PLATELET    EKG None  Radiology DG Chest 2  View  Result Date: 03/09/2023 CLINICAL DATA:  Cough. EXAM: CHEST - 2 VIEW COMPARISON:  11/27/2022. FINDINGS: The heart size and mediastinal contours are within normal limits. Both lungs are clear. Sternotomy wires are noted over the midline. Surgical clips are present in the upper abdomen. No acute osseous abnormality. IMPRESSION: No active cardiopulmonary disease. Electronically Signed   By: Thornell Sartorius M.D.   On: 03/09/2023 20:07    Procedures .Critical Care  Performed by: Derwood Kaplan, MD Authorized by: Derwood Kaplan, MD   Critical care provider statement:    Critical care time (minutes):  36   Critical care was necessary to treat or prevent imminent or life-threatening deterioration of the following conditions: COPD exacerbation.   Critical care was time spent personally by me on the following activities:  Development of treatment plan with patient or surrogate, discussions with consultants, evaluation of patient's response to treatment, examination of patient, ordering and review of laboratory studies, ordering and review of radiographic studies, ordering and performing treatments and interventions, pulse oximetry, re-evaluation of patient's condition and review of old charts     Medications Ordered in ED Medications  albuterol (VENTOLIN HFA) 108 (90 Base) MCG/ACT inhaler 2 puff (has no administration in time range)  albuterol (PROVENTIL) (2.5 MG/3ML) 0.083% nebulizer solution (10 mg/hr Nebulization Given 03/09/23 2110)  ipratropium (ATROVENT) nebulizer solution 0.5 mg (0.5 mg Nebulization Given 03/09/23 2110)    ED Course/ Medical Decision Making/ A&P                             Medical Decision Making Amount and/or Complexity of Data Reviewed Labs: ordered.  Risk Prescription drug management.   This patient presents to the ED with chief complaint(s) of URI-like symptoms along with chest tightness, shortness of breath with pertinent past medical history of CAD status  post CABG, PE, COPD.The complaint involves an extensive differential diagnosis and also carries with it a high risk of complications and morbidity.    The differential diagnosis includes : COPD exacerbation secondary to viral illness, COVID-19, influenza, PE, pneumonia, pleural effusion, pulmonary edema.  The initial plan is to get basic labs and initiate hour-long breathing treatment.  Patient had x-ray done earlier today within our system.  I have independently interpreted  the chest x-ray, there is no evidence of focal consolidation or pleural effusion.  Additional history obtained: Additional history obtained from spouse Records reviewed Primary Care Documents.  Have also reviewed previous workup.  No recent CT PE.  2018 echocardiogram revealed preserved EF.  Independent labs interpretation:  The following labs were independently interpreted: Blood gas shows no hypercapnia.  Patient is not hypoxic.  CBC is overall reassuring.  Independent visualization and interpretation of imaging: - I independently visualized the following imaging with scope of interpretation limited to determining acute life threatening conditions related to emergency care: X-ray of the chest which was done earlier today, which revealed no evidence of focal consolidation  Treatment and Reassessment: Patient reassessed after hour-long breathing treatment. She feels jittery.  She has anxiety.  She normally takes Ativan for it.  She also takes metoprolol.  She suspects that she needs them right now. Chest tightness is better.  Breathing has improved.  Repeat exam reveals clearing of wheezing in all lung fields. Patient is not in any respiratory distress nor is there hypoxia.  Patient still noted to be tachycardic. She has history of COPD, CAD and we had a long conversation about admitting patient to the hospital for COPD exacerbation versus going home with strict return precautions.  Patient and the husband prefer going  home if the treatment in the hospital is mostly supportive.  I informed the family, that the benefit of being in the hospital is that you get close monitoring and prompt treatment if your symptoms are getting worse.  Additionally, if she is not responding then we can pursue different workup such as evaluating for blood clot in the lungs.  Patient initially agreed to admission, but then she changed her mind again.  I reassessed and spoke with them 1 more time.  Again, family is comfortable going home.  Husband is going to be with her.  They asked me for my opinion.  I informed that from vital signs perspective, she appears to be better and she has responded to breathing treatments which is reassuring.  However she still has mild tachypnea and she has a lot of comorbidities, therefore if she does not feel comfortable going home that it is quite easy to admit her as an observation status.  Patient after this discussion agreed to ambulatory pulse ox test.  With ambulatory pulse ox she did not have any hypoxia.  Upon reassessment again, she felt comfortable going home.  Return precautions discussed.  I informed patient to make sure that she sets clear boundaries on return precautions and she returns if she starts having worsening chest pain, shortness of breath, dizziness or near fainting.  COPD action plan provided. If the patient returns to the ER, we will have to entertain the idea of evaluating her again for PE.  The patient appears reasonably screened and/or stabilized for discharge and I doubt any other medical condition or other [facility] requiring further screening, evaluation, or treatment in the ED at this time prior to discharge.   Results from the ER workup discussed with the patient face to face and all questions answered to the best of my ability. The patient is safe for discharge with strict return precautions.   Consideration for admission or further workup: Admitting for observation for COPD  exacerbation  Social Determinants of health: None   Final Clinical Impression(s) / ED Diagnoses Final diagnoses:  COPD exacerbation (HCC)    Rx / DC Orders ED Discharge Orders  Ordered    predniSONE (DELTASONE) 10 MG tablet  Daily        03/09/23 2254              Derwood Kaplan, MD 03/09/23 2313

## 2023-03-09 NOTE — Progress Notes (Signed)
Acute Office Visit  Subjective:     Patient ID: Brenda Cruz, female    DOB: 28-Jan-1966, 57 y.o.   MRN: [id number]  Chief Complaint  Patient presents with   Cough   Nasal Congestion   Headache   Generalized Body Aches   Fever    X 2 days    Cough This is a new problem. The current episode started in the past 7 days. The problem has been rapidly worsening. The problem occurs constantly. The cough is Productive of purulent sputum. Associated symptoms include chills, a fever, headaches, nasal congestion, shortness of breath and wheezing. Pertinent negatives include no chest pain, ear congestion, ear pain, eye redness, heartburn, hemoptysis, myalgias, postnasal drip, rash, rhinorrhea, sore throat, sweats or weight loss. Exacerbated by: any activity. She has tried a beta-agonist inhaler and ipratropium inhaler for the symptoms. The treatment provided no relief. Her past medical history is significant for COPD.  Headache  The current episode started in the past 7 days. The problem has been gradually worsening. Pain location: global. The pain does not radiate. The quality of the pain is described as aching. The pain is moderate. Associated symptoms include coughing, dizziness, a fever and weakness. Pertinent negatives include no abdominal pain, abnormal behavior, anorexia, back pain, blurred vision, drainage, ear pain, eye pain, eye redness, eye watering, facial sweating, hearing loss, insomnia, loss of balance, muscle aches, nausea, neck pain, numbness, phonophobia, photophobia, rhinorrhea, scalp tenderness, seizures, sinus pressure, sore throat, swollen glands, tingling, tinnitus, visual change, vomiting or weight loss.  Fever  This is a new problem. The current episode started in the past 7 days. The problem has been waxing and waning. The maximum temperature noted was 101 to 101.9 F. The temperature was taken using an oral thermometer. Associated symptoms include congestion, coughing,  headaches, sleepiness and wheezing. Pertinent negatives include no abdominal pain, chest pain, diarrhea, ear pain, muscle aches, nausea, rash, sore throat, urinary pain or vomiting.     Review of Systems  Constitutional:  Positive for chills, fever and malaise/fatigue. Negative for diaphoresis and weight loss.  HENT:  Positive for congestion. Negative for ear discharge, ear pain, hearing loss, nosebleeds, postnasal drip, rhinorrhea, sinus pressure, sinus pain, sore throat and tinnitus.   Eyes:  Negative for blurred vision, photophobia, pain and redness.  Respiratory:  Positive for cough, sputum production, shortness of breath and wheezing. Negative for hemoptysis and stridor.   Cardiovascular:  Positive for orthopnea. Negative for chest pain, palpitations, claudication, leg swelling and PND.  Gastrointestinal:  Negative for abdominal pain, anorexia, diarrhea, heartburn, nausea and vomiting.  Genitourinary:  Negative for dysuria.  Musculoskeletal:  Negative for back pain, myalgias and neck pain.  Skin:  Positive for itching. Negative for rash.  Neurological:  Positive for dizziness, weakness and headaches. Negative for tingling, tremors, sensory change, focal weakness, seizures, loss of consciousness, numbness and loss of balance.  Psychiatric/Behavioral:  Negative for depression. The patient does not have insomnia.   All other systems reviewed and are negative.       Objective:    BP (!) 150/87   Pulse (!) 101   Temp (!) 96.9 F (36.1 C) (Temporal)   Ht 5\' 8"  (1.727 m)   Wt 204 lb 3.2 oz (92.6 kg)   LMP 06/03/2017 (Exact Date)   SpO2 97%   BMI 31.05 kg/m    Physical Exam Vitals and nursing note reviewed.  Constitutional:      General: She is in acute distress.  Appearance: Normal appearance. She is well-developed and well-groomed. She is ill-appearing. She is not toxic-appearing or diaphoretic.  HENT:     Head: Normocephalic and atraumatic.     Jaw: There is normal jaw  occlusion.     Right Ear: Hearing normal.     Left Ear: Hearing normal.     Nose: Nose normal.     Mouth/Throat:     Lips: Pink.     Mouth: Mucous membranes are moist.     Pharynx: Oropharynx is clear. Uvula midline.  Eyes:     General: Lids are normal.     Extraocular Movements: Extraocular movements intact.     Conjunctiva/sclera: Conjunctivae normal.     Pupils: Pupils are equal, round, and reactive to light.  Neck:     Thyroid: No thyroid mass, thyromegaly or thyroid tenderness.     Vascular: No carotid bruit or JVD.     Trachea: Trachea and phonation normal.  Cardiovascular:     Rate and Rhythm: Normal rate and regular rhythm.     Chest Wall: PMI is not displaced.     Pulses: Normal pulses.     Heart sounds: Normal heart sounds. No murmur heard.    No friction rub. No gallop.  Pulmonary:     Effort: Pulmonary effort is normal. Tachypnea present. No respiratory distress.     Breath sounds: Decreased air movement present. Wheezing and rhonchi present.  Abdominal:     General: Bowel sounds are normal. There is no distension or abdominal bruit.     Palpations: Abdomen is soft. There is no hepatomegaly or splenomegaly.     Tenderness: There is no abdominal tenderness. There is no right CVA tenderness or left CVA tenderness.     Hernia: No hernia is present.  Musculoskeletal:        General: Normal range of motion.     Cervical back: Normal range of motion and neck supple.     Right lower leg: No edema.     Left lower leg: No edema.  Lymphadenopathy:     Cervical: No cervical adenopathy.  Skin:    General: Skin is warm and dry.     Capillary Refill: Capillary refill takes less than 2 seconds.     Coloration: Skin is not cyanotic, jaundiced or pale.     Findings: No rash.  Neurological:     General: No focal deficit present.     Mental Status: She is alert and oriented to person, place, and time.     GCS: GCS eye subscore is 4. GCS verbal subscore is 5. GCS motor subscore  is 6.     Sensory: Sensation is intact.     Motor: Motor function is intact.     Coordination: Coordination is intact.     Gait: Gait is intact.     Deep Tendon Reflexes: Reflexes are normal and symmetric.  Psychiatric:        Attention and Perception: Attention and perception normal.        Mood and Affect: Mood and affect normal.        Speech: Speech normal.        Behavior: Behavior normal. Behavior is cooperative.        Thought Content: Thought content normal.        Cognition and Memory: Cognition and memory normal.        Judgment: Judgment normal.     No results found for any visits on 03/09/23.  Assessment & Plan:   Problem List Items Addressed This Visit   None Visit Diagnoses     Acute cough    -  Primary   Relevant Medications   albuterol (PROVENTIL) (2.5 MG/3ML) 0.083% nebulizer solution 2.5 mg (Completed)   ipratropium (ATROVENT) nebulizer solution 0.5 mg (Completed)   methylPREDNISolone acetate (DEPO-MEDROL) injection 40 mg (Completed) (Start on 03/09/2023 12:45 PM)   Other Relevant Orders   Veritor Flu A/B Waived   COVID-19, Flu A+B and RSV   DG Chest 2 View   COPD with acute exacerbation (HCC)       Relevant Medications   albuterol (PROVENTIL) (2.5 MG/3ML) 0.083% nebulizer solution 2.5 mg (Completed)   ipratropium (ATROVENT) nebulizer solution 0.5 mg (Completed)   methylPREDNISolone acetate (DEPO-MEDROL) injection 40 mg (Completed) (Start on 03/09/2023 12:45 PM)       Meds ordered this encounter  Medications   albuterol (PROVENTIL) (2.5 MG/3ML) 0.083% nebulizer solution 2.5 mg   ipratropium (ATROVENT) nebulizer solution 0.5 mg   methylPREDNISolone acetate (DEPO-MEDROL) injection 40 mg   CXR in office without significant findings.    Albuterol/Atrovent nebulizer given in office without improvement in symptoms. IM depomedrol given in office. Due to lack of improvement, pt sent to ED for evaluation and probable admission.   Influenza negative  in office.    Kari Baars, [name]

## 2023-03-09 NOTE — ED Notes (Signed)
Pt ambulated around department, O2 saturation dropped from 98% on room air to 95%, room air; heart rate increased from 120 bpm to 138 bpm.   EDP made aware.

## 2023-03-09 NOTE — Patient Instructions (Signed)
To ED for evaluation and probable admission.

## 2023-03-09 NOTE — ED Triage Notes (Signed)
Pt via POV sent from dr's office for eval of chest congestion, fever, chills, sweats, SOB, dyspnea w exertion, dizziness, and noted drops in O2 sat while in office. Symptoms first started 2 days ago. Pt received a steroid injection and breathing treatment earlier today PTA. No known sick contacts.

## 2023-03-09 NOTE — Discharge Instructions (Addendum)
We suspect that you most likely have an upper respiratory viral infection that is triggering her COPD.  Other possibilities include blood clot.  You received hour-long breathing treatment.  You feel better, but not completely normal.  You have elected to go home, and to return to the emergency room if your symptoms get worse.  Read the instructions on COPD action plan. Take your rescue inhaler every 2 to 4 hours as needed for wheezing over the next 24 to 48 hours.  Return to the emergency room immediately if you start having worsening shortness of breath, worsening chest pain, inability to tolerate any fluids, confusion.

## 2023-03-10 ENCOUNTER — Encounter: Payer: Self-pay | Admitting: Family Medicine

## 2023-03-10 LAB — COVID-19, FLU A+B AND RSV
Influenza A, NAA: NOT DETECTED
Influenza B, NAA: NOT DETECTED
RSV, NAA: NOT DETECTED
SARS-CoV-2, NAA: NOT DETECTED

## 2023-03-14 ENCOUNTER — Encounter: Payer: Self-pay | Admitting: Family

## 2023-03-14 ENCOUNTER — Ambulatory Visit: Payer: BC Managed Care – PPO | Admitting: Family

## 2023-03-14 VITALS — BP 172/94 | HR 84 | Temp 97.6°F | Ht 68.0 in | Wt 205.0 lb

## 2023-03-14 DIAGNOSIS — Z720 Tobacco use: Secondary | ICD-10-CM | POA: Diagnosis not present

## 2023-03-14 DIAGNOSIS — E1165 Type 2 diabetes mellitus with hyperglycemia: Secondary | ICD-10-CM | POA: Diagnosis not present

## 2023-03-14 DIAGNOSIS — I10 Essential (primary) hypertension: Secondary | ICD-10-CM

## 2023-03-14 DIAGNOSIS — Z09 Encounter for follow-up examination after completed treatment for conditions other than malignant neoplasm: Secondary | ICD-10-CM | POA: Diagnosis not present

## 2023-03-14 DIAGNOSIS — J441 Chronic obstructive pulmonary disease with (acute) exacerbation: Secondary | ICD-10-CM

## 2023-03-14 DIAGNOSIS — J411 Mucopurulent chronic bronchitis: Secondary | ICD-10-CM

## 2023-03-14 MED ORDER — METHYLPREDNISOLONE ACETATE 80 MG/ML IJ SUSP
80.0000 mg | Freq: Once | INTRAMUSCULAR | Status: AC
  Administered 2023-03-14: 80 mg via INTRAMUSCULAR

## 2023-03-14 MED ORDER — DOXYCYCLINE HYCLATE 100 MG PO TABS: 100.0000 mg | ORAL_TABLET | Freq: Two times a day (BID) | ORAL | 0 refills | Status: DC

## 2023-03-14 NOTE — Progress Notes (Addendum)
Subjective:    Patient ID: Brenda Cruz, female    DOB: [birth year], 57 y.o.   MRN: [id number]  Chief Complaint  Patient presents with   ER follow up   Pt presents to the office today for ED follow up. She went to the ED on 03/09/23 with SOB and diagnosed with COPD exacerbation. She had a negative chest x-ray. She was given prednisone.  She is a diabetic and her last A1C was 9.0.  Cough This is a new problem. The current episode started 1 to 4 weeks ago. The problem has been unchanged. The problem occurs every few minutes. The cough is Non-productive. Associated symptoms include nasal congestion, postnasal drip, a sore throat, shortness of breath and wheezing. Pertinent negatives include no chills, ear congestion, ear pain or fever (resolved since last week). Risk factors for lung disease include smoking/tobacco exposure. She has tried rest and steroid inhaler for the symptoms. Her past medical history is significant for COPD.  Hypertension This is a chronic problem. The current episode started more than 1 year ago. The problem is unchanged. The problem is uncontrolled. Associated symptoms include malaise/fatigue and shortness of breath. Pertinent negatives include no peripheral edema. The current treatment provides moderate improvement.      Review of Systems  Constitutional:  Positive for malaise/fatigue. Negative for chills and fever (resolved since last week).  HENT:  Positive for postnasal drip and sore throat. Negative for ear pain.   Respiratory:  Positive for cough, shortness of breath and wheezing.   All other systems reviewed and are negative.      Objective:   Physical Exam Vitals reviewed.  Constitutional:      General: She is not in acute distress.    Appearance: She is well-developed.  HENT:     Head: Normocephalic and atraumatic.     Right Ear: Tympanic membrane normal.     Left Ear: Tympanic membrane normal.  Eyes:     Pupils: Pupils are equal, round, and  reactive to light.  Neck:     Thyroid: No thyromegaly.  Cardiovascular:     Rate and Rhythm: Normal rate and regular rhythm.     Heart sounds: Normal heart sounds. No murmur heard. Pulmonary:     Effort: Pulmonary effort is normal. No respiratory distress.     Breath sounds: Wheezing and rhonchi present.     Comments: SOB with talking Abdominal:     General: Bowel sounds are normal. There is no distension.     Palpations: Abdomen is soft.     Tenderness: There is no abdominal tenderness.  Musculoskeletal:        General: No tenderness. Normal range of motion.     Cervical back: Normal range of motion and neck supple.  Skin:    General: Skin is warm and dry.  Neurological:     Mental Status: She is alert and oriented to person, place, and time.     Cranial Nerves: No cranial nerve deficit.     Deep Tendon Reflexes: Reflexes are normal and symmetric.  Psychiatric:        Behavior: Behavior normal.        Thought Content: Thought content normal.        Judgment: Judgment normal.       BP (!) 172/94   Pulse 84   Temp 97.6 F (36.4 C) (Temporal)   Ht 5\' 8"  (1.727 m)   Wt 205 lb (93 kg)   LMP 06/03/2017 (Exact Date)  SpO2 99%   BMI 31.17 kg/m      Assessment & Plan:  Brenda Cruz comes in today with chief complaint of ER follow up   Diagnosis and orders addressed:  1. COPD with acute exacerbation (HCC) Start doxycyline  Depo Medrol  Strict low carb diet  - doxycycline (VIBRA-TABS) 100 MG tablet; Take 1 tablet (100 mg total) by mouth 2 (two) times daily.  Dispense: 20 tablet; Refill: 0 - methylPREDNISolone acetate (DEPO-MEDROL) injection 80 mg - Ambulatory referral to Pulmonology  2. Hospital discharge follow-up - Ambulatory referral to Pulmonology  3. Type 2 diabetes mellitus with hyperglycemia, without long-term current use of insulin (HCC) Low carb  4. Tobacco abuse  5. Mucopurulent chronic bronchitis (HCC)   6. Essential hypertension Reports her  BP is at goal when she is not sick   Start doxycyline  Continue Breztri  Continue albuterol as needed  Referral Pulmonologist Needs follow up with PCP to help with uncontrolled DM and HTN  Red flags discussed to go to ED Health Maintenance reviewed Diet and exercise encouraged  Follow up plan: 1 week with PCP   Brenda Rodney, [name]

## 2023-03-14 NOTE — Patient Instructions (Signed)
Chronic Obstructive Pulmonary Disease  Chronic obstructive pulmonary disease (COPD) is a long-term (chronic) condition that affects the lungs. COPD is a general term that can be used to describe many different lung problems that cause lung inflammation and limit airflow, including chronic bronchitis and emphysema. If you have COPD, your lung function will probably never return to normal. In most cases, it gets worse over time. However, there are steps you can take to slow the progression of the disease and improve your quality of life. What are the causes? This condition may be caused by: Smoking. This is the most common cause. Certain genes passed down through families. What increases the risk? The following factors may make you more likely to develop this condition: Being exposed to secondhand smoke from cigarettes, pipes, or cigars. Being exposed to chemicals and other irritants, such as fumes and dust in the work environment. Having chronic lung conditions or infections. What are the signs or symptoms? Symptoms of this condition include: Shortness of breath, especially during physical activity. Chronic cough with a large amount of thick mucus. Sometimes, the cough may not have any mucus (dry cough). Wheezing and rapid breathing. Gray or bluish discoloration (cyanosis) of the skin, especially in the fingers, toes, or lips. Feeling tired (fatigue). Weight loss. Chest tightness. Frequent infections. Episodes when breathing symptoms become much worse (exacerbations). At the later stages of this disease, you may have swelling in the ankles, feet, or legs. How is this diagnosed? This condition is diagnosed based on: Your medical history. A physical exam. You may also have tests, including: Lung (pulmonary) function tests. This may include a spirometry test, which measures your ability to exhale properly. Chest X-ray. CT scan. Blood tests. How is this treated? This condition may be  treated with: Medicines. These may include inhaled rescue medicines to treat acute exacerbations as well as medicines that you take long-term (maintenance medicines) to prevent flare-ups of COPD. Bronchodilators help treat COPD by dilating the airways to allow increased airflow and make your breathing more comfortable. Steroids can reduce airway inflammation and help prevent exacerbations. Smoking cessation. If you smoke, your health care provider may ask you to quit, and may also recommend therapy or replacement products to help you quit. Pulmonary rehabilitation. This may involve working with a team of health care providers and specialists, such as respiratory, occupational, and physical therapists. Exercise and physical activity. These are beneficial for nearly all people with COPD. Nutrition therapy to gain weight, if you are underweight. Oxygen. Supplemental oxygen therapy is only helpful if you have a low oxygen level in your blood (hypoxemia). Lung surgery or transplant. Palliative care. This is to help people with COPD feel comfortable when treatment is no longer working. Follow these instructions at home: Medicines Take over-the-counter and prescription medicines only as told by your health care provider. This includes inhaled medicines and pills. Talk to your health care provider before taking any cough or allergy medicines. You may need to avoid certain medicines that dry out your airways. Lifestyle If you smoke, the most important thing that you can do is to stop smoking. Continuing to smoke will cause the disease to progress faster. Do not use any products that contain nicotine or tobacco. These products include cigarettes, chewing tobacco, and vaping devices, such as e-cigarettes. If you need help quitting, ask your health care provider. Avoid exposure to things that irritate your lungs, such as smoke, chemicals, and fumes. Stay active, but balance activity with periods of rest.  Exercise and  physical activity will help you maintain your ability to do things you want to do. Learn and use relaxation techniques to manage stress and to control your breathing. Get the right amount of sleep and get quality sleep. Most adults need 7 or more hours per night. Eat healthy foods. Eating smaller, more frequent meals and resting before meals may help you maintain your strength. Controlled breathing Learn and use controlled breathing techniques as directed by your health care provider. Controlled breathing techniques include: Pursed lip breathing. Start by breathing in (inhaling) through your nose for 1 second. Then, purse your lips as if you were going to whistle and breathe out (exhale) through the pursed lips for 2 seconds. Diaphragmatic breathing. Start by putting one hand on your abdomen just above your waist. Inhale slowly through your nose. The hand on your abdomen should move out. Then purse your lips and exhale slowly. You should be able to feel the hand on your abdomen moving in as you exhale.  Controlled coughing Learn and use controlled coughing to clear mucus from your lungs. Controlled coughing is a series of short, progressive coughs. The steps of controlled coughing are: Lean your head slightly forward. Breathe in deeply using diaphragmatic breathing. Try to hold your breath for 3 seconds. Keep your mouth slightly open while coughing twice. Spit any mucus out into a tissue. Rest and repeat the steps once or twice as needed. General instructions Make sure you receive all the vaccines that your health care provider recommends, especially the pneumococcal and influenza vaccines. Preventing infection and hospitalization is very important when you have COPD. Drink enough fluid to keep your urine pale yellow, unless you have a medical condition that requires fluid restriction. Use oxygen therapy and pulmonary rehabilitation if told by your health care provider. If you  require home oxygen therapy, ask your health care provider whether you should purchase a pulse oximeter to measure your oxygen level at home. Work with your health care provider to develop a COPD action plan. This will help you know what steps to take if your condition gets worse. Keep other chronic health conditions under control as told by your health care provider. Avoid extreme temperature and humidity changes. Avoid contact with people who have an illness that spreads from person to person (is contagious), such as viral infections or pneumonia. Keep all follow-up visits. This is important. Contact a health care provider if: You are coughing up more mucus than usual. There is a change in the color or thickness of your mucus. Your breathing is more labored than usual. Your breathing is faster than usual. You have difficulty sleeping. You need to use your rescue medicines or inhalers more often than expected. You have trouble doing routine activities such as getting dressed or walking around the house. Get help right away if: You have shortness of breath while you are resting. You have shortness of breath that prevents you from: Being able to talk. Performing your usual physical activities. You have chest pain lasting longer than 5 minutes. Your skin color is more blue (cyanotic) than usual. You measure low oxygen saturations for longer than 5 minutes with a pulse oximeter. You have a fever. You feel too tired to breathe normally. These symptoms may represent a serious problem that is an emergency. Do not wait to see if the symptoms will go away. Get medical help right away. Call your local emergency services (911 in the [place] Do not drive yourself to the hospital. Summary Chronic obstructive  pulmonary disease (COPD) is a long-term (chronic) condition that affects the lungs. Your lung function will probably never return to normal. In most cases, it gets worse over time. However, there  are steps you can take to slow the progression of the disease and improve your quality of life. Treatment for COPD may include taking medicines, quitting smoking, pulmonary rehabilitation, and changes to diet and exercise. As the disease progresses, you may need oxygen therapy, a lung transplant, or palliative care. To help manage your condition, do not smoke, avoid exposure to things that irritate your lungs, stay up to date on all vaccines, and follow your health care provider's instructions for taking medicines. This information is not intended to replace advice given to you by your health care provider. Make sure you discuss any questions you have with your health care provider. Document Revised: 08/10/2020 Document Reviewed: 08/11/2020 Elsevier Patient Education  2024 [employer].

## 2023-03-24 ENCOUNTER — Ambulatory Visit: Payer: Managed Care, Other (non HMO) | Admitting: Nurse Practitioner

## 2023-03-28 NOTE — Progress Notes (Deleted)
Synopsis: Referred for copd, hospital follow up by Junie Spencer, [name]  Subjective:   PATIENT ID: Brenda Cruz GENDER: female DOB: 1966/08/23, MRN: [id number]  No chief complaint on file.  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  Past Medical History:  Diagnosis Date   Anxiety    Asthma    COPD (chronic obstructive pulmonary disease) (HCC)    Coronary artery disease    DDD (degenerative disc disease)    GERD (gastroesophageal reflux disease)    Hemorrhoids    HIT (heparin-induced thrombocytopenia) (HCC)    Hyperlipemia    Hypertension    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) 12/15/2017   Iron deficiency anemia 11/23/2015   Myocardial infarction [facility]) 2019   widow maker   PONV (postoperative nausea and vomiting)    Pulmonary emboli (HCC) 12/07/2015   [place] spotted fever    TMJ (temporomandibular joint disorder)    Type 2 diabetes mellitus (HCC) 12/15/2017     Family History  Problem Relation Age of Onset   Colon polyps Father    Stomach cancer Paternal Grandmother    Esophageal cancer Maternal Grandfather    Breast cancer Mother    Ovarian cancer Other        Maternal Side Great  Aunt   Colon cancer Neg Hx      Past Surgical History:  Procedure Laterality Date   CARDIAC CATHETERIZATION N/A 10/29/2015   Procedure: Left Heart Cath and Coronary Angiography;  Surgeon: Runell Gess, MD;  Location: [facility] INVASIVE CV LAB;  Service: Cardiovascular;  Laterality: N/A;   CHOLECYSTECTOMY     CORONARY ARTERY BYPASS GRAFT N/A 11/02/2015   Procedure: CORONARY ARTERY BYPASS GRAFTING (CABG)x 1 using left internal mammary artery.;  Surgeon: Loreli Slot, MD;  Location: [facility] OR;  Service: Open Heart Surgery;  Laterality: N/A;   LEFT OOPHORECTOMY Left    Related to tubal torsion.  Bilateral  salpingectomy also.  Remaining uterus and right ovary   LUMBAR FUSION     TEE WITHOUT CARDIOVERSION N/A 11/02/2015   Procedure: TRANSESOPHAGEAL ECHOCARDIOGRAM (TEE);  Surgeon: Loreli Slot, MD;  Location: [facility] OR;  Service: Open Heart Surgery;  Laterality: N/A;   XI ROBOTIC ASSISTED VENTRAL HERNIA N/A 01/11/2023   Procedure: XI ROBOTIC ASSISTED VENTRAL HERNIA REPAIR W/ MESH- SUPRAUMBILICAL;  Surgeon: Lewie Chamber, DO;  Location: [facility] ORS;  Service: General;  Laterality: N/A;    Social History   Socioeconomic History   Marital status: Married    Spouse name: Not on file   Number of children: 1   Years of education: Not on file   Highest education level: Not on file  Occupational History   Occupation: Medical Billing  Tobacco Use   Smoking status: Former   Smokeless tobacco: Never   Tobacco comments:    Counseling paper for smoking given to patient in exam room   Vaping Use   Vaping Use: Never used  Substance and Sexual Activity   Alcohol use: No    Alcohol/week: 0.0 standard drinks of alcohol   Drug use: No   Sexual activity: Not on file  Other Topics Concern   Not on file  Social History Narrative   2 caffeine drinks daily    Social Determinants of Health   Financial Resource Strain: Not on file  Food Insecurity: Not on file  Transportation Needs: Not on file  Physical Activity: Not on file  Stress: Not on file  Social Connections: Not on file  Intimate Partner Violence: Not on file     Allergies  Allergen Reactions   Atorvastatin Other (See Comments)    Myalgia    Iohexol Itching, Rash and Other (See Comments)    Desc: White blisters in mouth during ivp in [place] '93, ok w/ 13 hour prep today//a.calhoun, Onset Date: 10/30/1991    Ace Inhibitors Cough   Farxiga [Dapagliflozin]     UTI, heart palpitations   Heparin Other (See Comments)    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   Sulfa Antibiotics Itching, Rash and Other (See Comments)     Outpatient Medications Prior to Visit  Medication Sig Dispense Refill   albuterol (VENTOLIN HFA) 108 (90 Base) MCG/ACT inhaler Inhale 2 puffs into the lungs every 4 (four) hours as needed for wheezing or shortness of breath. 18 g 1   amLODipine (NORVASC) 5 MG tablet Take 1 tablet (5 mg total) by mouth daily. 90 tablet 1   aspirin EC 81 MG tablet Take 1 tablet (81 mg total) by mouth every evening. 30 tablet 11   blood glucose meter kit and supplies Dispense based on patient and insurance preference. Use up to four times daily as directed. (FOR ICD-10 E10.9, E11.9). 1 each 0   Budeson-Glycopyrrol-Formoterol (BREZTRI AEROSPHERE) 160-9-4.8 MCG/ACT AERO Inhale into the lungs.     Calcium Carb-Cholecalciferol (CALCIUM 600 + D PO) Take 1 tablet by mouth daily.     Continuous Blood Gluc Sensor (FREESTYLE LIBRE 3 SENSOR) MISC Place 1 sensor on the skin every 14 days. Use to check glucose continuously 2 each 22   cyclobenzaprine (FLEXERIL) 10 MG tablet TAKE ONE TABLET BY MOUTH THREE TIMES DAILY AS NEEDED FOR MUSCLE SPASM 30 tablet 0   diphenhydrAMINE (BENADRYL) 25 MG tablet Take 25 mg by mouth every 6 (six) hours as needed for itching or allergies.     doxycycline (VIBRA-TABS) 100 MG tablet Take 1 tablet (100 mg total) by mouth 2 (two) times daily. 20 tablet 0   esomeprazole (NEXIUM) 40 MG capsule Take 1 capsule (40 mg total) by mouth daily. 90 capsule 1   FLUoxetine (PROZAC) 40 MG  capsule Take 2 capsules (80 mg total) by mouth daily. TAKE ONE (1) CAPSULE EACH DAY (Patient taking differently: Take 40 mg by mouth daily.) 180 capsule 1   fluticasone (FLONASE) 50 MCG/ACT nasal spray Place 2 sprays into both nostrils daily. (Patient taking differently: Place 2 sprays into both nostrils daily as needed for allergies.) 16 g 6   ibuprofen (ADVIL) 200 MG tablet Take 400 mg by mouth every 6 (six) hours as needed for moderate pain.     ipratropium-albuterol (DUONEB) 0.5-2.5 (3) MG/3ML SOLN Take 3 mLs by nebulization every 6 (six) hours as needed (Foir shortness of breath). 360 mL 0   LORazepam (ATIVAN) 0.5 MG tablet  Take 1 tablet (0.5 mg total) by mouth every 8 (eight) hours as needed for anxiety. 90 tablet 2   magnesium oxide (MAG-OX) 400 MG tablet Take 1 tablet (400 mg total) by mouth daily. (Patient taking differently: Take 800 mg by mouth 2 (two) times daily.) 90 tablet 1   MELATONIN PO Take 1 tablet by mouth at bedtime as needed (sleep).     metFORMIN (GLUCOPHAGE) 1000 MG tablet Take 1 tablet (1,000 mg total) by mouth 2 (two) times daily. 180 tablet 1   metoprolol tartrate (LOPRESSOR) 50 MG tablet Take 1 tablet (50 mg total) by mouth 2 (two) times daily. 180 tablet 1   nitroGLYCERIN (NITROSTAT) 0.4 MG SL tablet Place 1 tablet (0.4 mg total) under the tongue every 5 (five) minutes as needed for chest pain. 90 tablet 0   ondansetron (ZOFRAN-ODT) 4 MG disintegrating tablet Take 1 tablet (4 mg total) by mouth every 8 (eight) hours as needed. 20 tablet 0   oxyCODONE (ROXICODONE) 5 MG immediate release tablet Take 1 tablet (5 mg total) by mouth every 6 (six) hours as needed. 16 tablet 0   predniSONE (DELTASONE) 10 MG tablet Take 4 tablets (40 mg total) by mouth daily. 20 tablet 0   rosuvastatin (CRESTOR) 10 MG tablet Take 1 tablet (10 mg total) by mouth daily. 90 tablet 3   Semaglutide (RYBELSUS) 3 MG TABS Take 3 mg by mouth daily.     Semaglutide,0.25 or 0.5MG /DOS, 2 MG/3ML SOPN Inject  0.25mg  once a week for 4 weeks and then increase to 0.5mg  weekly 3 mL 1   No facility-administered medications prior to visit.       Objective:   Physical Exam:  General appearance: 57 y.o., female, NAD, conversant  Eyes: anicteric sclerae; PERRL, tracking appropriately HENT: NCAT; MMM Neck: Trachea midline; no lymphadenopathy, no JVD Lungs: CTAB, no crackles, no wheeze, with normal respiratory effort CV: RRR, no murmur  Abdomen: Soft, non-tender; non-distended, BS present  Extremities: No peripheral edema, warm Skin: Normal turgor and texture; no rash Psych: Appropriate affect Neuro: Alert and oriented to person and place, no focal deficit     There were no vitals filed for this visit.   on *** LPM *** RA BMI Readings from Last 3 Encounters:  03/14/23 31.17 kg/m  03/09/23 31.02 kg/m  03/09/23 31.05 kg/m   Wt Readings from Last 3 Encounters:  03/14/23 205 lb (93 kg)  03/09/23 204 lb (92.5 kg)  03/09/23 204 lb 3.2 oz (92.6 kg)     CBC    Component Value Date/Time   WBC 6.3 03/09/2023 2113   RBC 4.11 03/09/2023 2113   HGB 12.4 03/09/2023 2113   HGB 13.6 11/29/2022 0918   HGB 12.4 12/07/2015 1029   HCT 37.3 03/09/2023 2113   HCT 41.0 11/29/2022 0918   HCT 38.2 12/07/2015 1029   PLT 208 03/09/2023 2113   PLT 227 11/29/2022 0918   MCV 90.8 03/09/2023 2113   MCV 91 11/29/2022 0918   MCV 87.2 12/07/2015 1029   MCH 30.2 03/09/2023 2113   MCHC 33.2 03/09/2023 2113   RDW 12.8 03/09/2023 2113   RDW 12.0 11/29/2022 0918   RDW 14.8 (H) 12/07/2015 1029   LYMPHSABS 1.8 03/09/2023 2113   LYMPHSABS 2.7 11/29/2022 0918   LYMPHSABS 2.4 12/07/2015 1029   MONOABS 0.7 03/09/2023 2113   MONOABS 0.9 12/07/2015 1029   EOSABS 0.1 03/09/2023 2113   EOSABS 0.2 11/29/2022 0918   BASOSABS 0.0 03/09/2023 2113   BASOSABS 0.1  11/29/2022 0918   BASOSABS 0.1 12/07/2015 1029    Eos 200-500   Chest Imaging: CTA Chest 2018 with edac, bronchial wall thickening  CT Chest 2020  at [facility] with micronodules  Pulmonary Functions Testing Results:    Latest Ref Rng & Units 10/30/2015   11:15 AM  PFT Results  FVC-Pre L 3.61   FVC-Predicted Pre % 94   FVC-Post L 3.65   FVC-Predicted Post % 95   Pre FEV1/FVC % % 81   Post FEV1/FCV % % 82   FEV1-Pre L 2.94   FEV1-Predicted Pre % 96   FEV1-Post L 3.00   DLCO uncorrected ml/min/mmHg 18.78   DLCO UNC% % 69   DLVA Predicted % 76   TLC L 6.06   TLC % Predicted % 113   RV % Predicted % 131    PFT 2017 with mildly reduced diffusing capacity, no significant BD resposne  FeNO: ***  Pathology: ***  Echocardiogram: ***  Heart Catheterization: ***    Assessment & Plan:    Plan:      Omar Person, MD [facility] 03/28/2023 4:38 PM

## 2023-03-29 ENCOUNTER — Encounter: Payer: Self-pay | Admitting: Nurse Practitioner

## 2023-03-31 ENCOUNTER — Institutional Professional Consult (permissible substitution): Payer: Managed Care, Other (non HMO) | Admitting: Student

## 2023-05-16 ENCOUNTER — Ambulatory Visit: Payer: Managed Care, Other (non HMO) | Admitting: Cardiovascular Disease

## 2023-05-29 ENCOUNTER — Encounter: Payer: Self-pay | Admitting: Pulmonary Disease

## 2023-05-29 ENCOUNTER — Ambulatory Visit (INDEPENDENT_AMBULATORY_CARE_PROVIDER_SITE_OTHER): Payer: No Typology Code available for payment source | Admitting: Pulmonary Disease

## 2023-05-29 VITALS — BP 124/78 | HR 70 | Ht 68.0 in | Wt 210.6 lb

## 2023-05-29 DIAGNOSIS — R5383 Other fatigue: Secondary | ICD-10-CM

## 2023-05-29 DIAGNOSIS — F17210 Nicotine dependence, cigarettes, uncomplicated: Secondary | ICD-10-CM | POA: Diagnosis not present

## 2023-05-29 DIAGNOSIS — K529 Noninfective gastroenteritis and colitis, unspecified: Secondary | ICD-10-CM

## 2023-05-29 DIAGNOSIS — R0683 Snoring: Secondary | ICD-10-CM | POA: Diagnosis not present

## 2023-05-29 DIAGNOSIS — R0602 Shortness of breath: Secondary | ICD-10-CM

## 2023-05-29 NOTE — Progress Notes (Signed)
Synopsis: Referred in August 2024 for hospital follow - COPD  Subjective:   PATIENT ID: Brenda Cruz GENDER: female DOB: August 31, 1966, MRN: [id number]  HPI  Chief Complaint  Patient presents with   Hospitalization Follow-up    HFU COPD exacerbation-Doing much better has some chest pain, but could be related to open heart Patients husband has stated she snores and stops breathing during sleep   Brenda Cruz is a 57 year old woman, daily smoker with CAD, pulmonary emboli, DMII, HTN, GERD and asthma/COPD who is referred to pulmonary clinic for hospital follow up for COPD exacerbation.   She was treated in the ER on 03/09/23 for COPD exacerbation.She was treated with prednisone 40mg  daily for 5 days. She reports her breathing has been fine since. She does have exertional dyspnea. She does have cough with intermittent sputum production. She is concerned for rash on her forearms concerning for purpura.   She does snore in her sleep with witness apneas per her husband.  She is smoking 1 pack per day and has been smoking for 38 years. Second hand smoke from her father in childhood. Her father had significant cardiac disease and asbestos related lung disease along with COPD.  Maternal grandmother had emphysema. Lung cancer in maternal aunt. Her husband vapes in the house around her. She has a 32 year old child at home.  Past Medical History:  Diagnosis Date   Anxiety    Asthma    COPD (chronic obstructive pulmonary disease) (HCC)    Coronary artery disease    DDD (degenerative disc disease)    GERD (gastroesophageal reflux disease)    Hemorrhoids    HIT (heparin-induced thrombocytopenia) (HCC)    Hyperlipemia    Hypertension    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) 12/15/2017   Iron deficiency anemia 11/23/2015   Myocardial infarction [facility]) 2019   widow maker   PONV (postoperative nausea and vomiting)    Pulmonary emboli (HCC) 12/07/2015   [place] spotted fever     TMJ (temporomandibular joint disorder)    Type 2 diabetes mellitus (HCC) 12/15/2017     Family History  Problem Relation Age of Onset   Colon polyps Father    Stomach cancer Paternal Grandmother    Esophageal cancer Maternal Grandfather    Breast cancer Mother    Ovarian cancer Other        Maternal Side Great  Aunt   Colon cancer Neg Hx      Social History   Socioeconomic History   Marital status: Married    Spouse name: Not on file   Number of children: 1   Years of education: Not on file   Highest education level: Not on file  Occupational History   Occupation: Medical Billing  Tobacco Use   Smoking status: Every Day    Current packs/day: 1.00    Types: Cigarettes   Smokeless tobacco: Never   Tobacco comments:    Patient has been smoking 38years//PAP 8/12  Vaping Use   Vaping status: Never Used  Substance and Sexual Activity   Alcohol use: No    Alcohol/week: 0.0 standard drinks of alcohol   Drug use: No   Sexual activity: Not on file  Other Topics Concern   Not on file  Social History Narrative   2 caffeine drinks daily    Social Determinants of Health   Financial Resource Strain: Not on file  Food Insecurity: Not on file  Transportation Needs: Not on file  Physical Activity: Not on file  Stress: Not on file  Social Connections: Not on file  Intimate Partner Violence: Not on file     Allergies  Allergen Reactions   Atorvastatin Other (See Comments)    Myalgia    Iohexol Itching, Rash and Other (See Comments)    Desc: White blisters in mouth during ivp in [place] '93, ok w/ 13 hour prep today//a.calhoun, Onset Date: 10/30/1991    Ace Inhibitors Cough   Farxiga [Dapagliflozin]     UTI, heart palpitations   Heparin Other (See Comments)    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   Sulfa Antibiotics Itching, Rash and Other (See Comments)     Outpatient Medications Prior to Visit  Medication Sig Dispense Refill   MELATONIN PO Take 1 tablet by mouth at bedtime as needed (sleep).     albuterol (VENTOLIN HFA) 108 (90 Base) MCG/ACT inhaler Inhale 2 puffs into the lungs every 4 (four) hours as needed for wheezing or shortness of breath. 18 g 1   amLODipine (NORVASC) 5 MG tablet Take 1 tablet (5 mg total) by mouth daily. 90 tablet 1   aspirin EC 81 MG tablet Take 1 tablet (81 mg total) by mouth every evening. 30 tablet 11   blood glucose meter kit and supplies Dispense based on patient and insurance preference. Use up to four times daily as directed. (FOR ICD-10 E10.9, E11.9). 1 each 0   Budeson-Glycopyrrol-Formoterol (BREZTRI AEROSPHERE) 160-9-4.8 MCG/ACT AERO Inhale into the lungs.     Calcium Carb-Cholecalciferol (CALCIUM 600 + D PO) Take 1 tablet by mouth daily.     Continuous Blood Gluc Sensor (FREESTYLE LIBRE 3 SENSOR) MISC Place 1 sensor on the skin every 14 days. Use to check glucose continuously 2 each 22   cyclobenzaprine (FLEXERIL) 10 MG tablet TAKE ONE TABLET BY MOUTH THREE TIMES DAILY AS NEEDED FOR MUSCLE SPASM 30 tablet 0   diphenhydrAMINE (BENADRYL) 25 MG tablet Take 25 mg by mouth every 6 (six) hours as needed for itching or allergies.     esomeprazole (NEXIUM) 40 MG capsule Take 1 capsule (40 mg total) by mouth daily. 90 capsule 1   FLUoxetine (PROZAC) 40 MG capsule Take 2 capsules (80 mg total) by mouth daily. TAKE ONE (1) CAPSULE EACH DAY (Patient taking differently: Take 40 mg by mouth daily.) 180 capsule 1    fluticasone (FLONASE) 50 MCG/ACT nasal spray Place 2 sprays into both nostrils daily. (Patient taking differently: Place 2 sprays into both nostrils daily as needed for allergies.) 16 g 6   ibuprofen (ADVIL) 200 MG tablet Take 400 mg by mouth every 6 (six) hours as needed for moderate pain.     ipratropium-albuterol (DUONEB) 0.5-2.5 (3) MG/3ML SOLN Take 3 mLs by nebulization every 6 (six) hours as needed (Foir shortness of breath). 360 mL 0   LORazepam (ATIVAN) 0.5 MG tablet Take 1 tablet (0.5 mg total) by mouth every 8 (eight) hours as needed for anxiety. 90 tablet  2   magnesium oxide (MAG-OX) 400 MG tablet Take 1 tablet (400 mg total) by mouth daily. (Patient taking differently: Take 800 mg by mouth 2 (two) times daily.) 90 tablet 1   metFORMIN (GLUCOPHAGE) 1000 MG tablet Take 1 tablet (1,000 mg total) by mouth 2 (two) times daily. 180 tablet 1   metoprolol tartrate (LOPRESSOR) 50 MG tablet Take 1 tablet (50 mg total) by mouth 2 (two) times daily. 180 tablet 1   nitroGLYCERIN (NITROSTAT) 0.4 MG SL tablet Place 1 tablet (0.4 mg total) under the tongue every 5 (five) minutes as needed for chest pain. 90 tablet 0   ondansetron (ZOFRAN-ODT) 4 MG disintegrating tablet Take 1 tablet (4 mg total) by mouth every 8 (eight) hours as needed. 20 tablet 0   oxyCODONE (ROXICODONE) 5 MG immediate release tablet Take 1 tablet (5 mg total) by mouth every 6 (six) hours as needed. 16 tablet 0   rosuvastatin (CRESTOR) 10 MG tablet Take 1 tablet (10 mg total) by mouth daily. 90 tablet 3   Semaglutide,0.25 or 0.5MG /DOS, 2 MG/3ML SOPN Inject 0.25mg  once a week for 4 weeks and then increase to 0.5mg  weekly 3 mL 1   doxycycline (VIBRA-TABS) 100 MG tablet Take 1 tablet (100 mg total) by mouth 2 (two) times daily. 20 tablet 0   predniSONE (DELTASONE) 10 MG tablet Take 4 tablets (40 mg total) by mouth daily. 20 tablet 0   Semaglutide (RYBELSUS) 3 MG TABS Take 3 mg by mouth daily.     No facility-administered medications  prior to visit.    Review of Systems  Constitutional:  Negative for chills, fever, malaise/fatigue and weight loss.  HENT:  Positive for sore throat. Negative for congestion and sinus pain.   Eyes: Negative.   Respiratory:  Positive for cough, sputum production and shortness of breath. Negative for hemoptysis and wheezing.   Cardiovascular:  Positive for chest pain and palpitations. Negative for orthopnea, claudication and leg swelling.  Gastrointestinal:  Positive for abdominal pain and heartburn. Negative for nausea and vomiting.  Genitourinary: Negative.   Musculoskeletal:  Negative for joint pain and myalgias.  Skin:  Positive for itching and rash.  Neurological:  Negative for weakness.  Endo/Heme/Allergies: Negative.   Psychiatric/Behavioral: Negative.      Objective:   Vitals:   05/29/23 1528  BP: 124/78  Pulse: 70  SpO2: 98%  Weight: 210 lb 9.6 oz (95.5 kg)  Height: 5\' 8"  (1.727 m)   Physical Exam Constitutional:      General: She is not in acute distress.    Appearance: Normal appearance.  Eyes:     General: No scleral icterus.    Conjunctiva/sclera: Conjunctivae normal.  Cardiovascular:     Rate and Rhythm: Normal rate and regular rhythm.  Pulmonary:     Breath sounds: No wheezing, rhonchi or rales.  Musculoskeletal:     Right lower leg: No edema.     Left lower leg: No edema.  Skin:    General: Skin is warm and dry.  Neurological:     General: No focal deficit present.     CBC    Component Value Date/Time   WBC 6.3 03/09/2023 2113   RBC 4.11 03/09/2023 2113   HGB 12.4 03/09/2023 2113   HGB 13.6 11/29/2022 0918   HGB 12.4 12/07/2015 1029   HCT 37.3 03/09/2023 2113   HCT 41.0 11/29/2022 0918   HCT 38.2 12/07/2015 1029   PLT 208 03/09/2023 2113   PLT 227 11/29/2022 0918  MCV 90.8 03/09/2023 2113   MCV 91 11/29/2022 0918   MCV 87.2 12/07/2015 1029   MCH 30.2 03/09/2023 2113   MCHC 33.2 03/09/2023 2113   RDW 12.8 03/09/2023 2113   RDW 12.0  11/29/2022 0918   RDW 14.8 (H) 12/07/2015 1029   LYMPHSABS 1.8 03/09/2023 2113   LYMPHSABS 2.7 11/29/2022 0918   LYMPHSABS 2.4 12/07/2015 1029   MONOABS 0.7 03/09/2023 2113   MONOABS 0.9 12/07/2015 1029   EOSABS 0.1 03/09/2023 2113   EOSABS 0.2 11/29/2022 0918   BASOSABS 0.0 03/09/2023 2113   BASOSABS 0.1 11/29/2022 0918   BASOSABS 0.1 12/07/2015 1029      Latest Ref Rng & Units 03/09/2023    9:13 PM 01/09/2023    3:08 PM 11/29/2022    9:18 AM  BMP  Glucose 70 - 99 mg/dL 161  096  045   BUN 6 - 20 mg/dL 10  9  10    Creatinine 0.44 - 1.00 mg/dL 4.09  8.11  9.14   BUN/Creat Ratio 9 - 23   8   Sodium 135 - 145 mmol/L 136  136  138   Potassium 3.5 - 5.1 mmol/L 3.3  4.2  4.9   Chloride 98 - 111 mmol/L 98  102  97   CO2 22 - 32 mmol/L 26  25  18    Calcium 8.9 - 10.3 mg/dL 8.9  8.9  9.4    Chest imaging: CXR 03/09/23 The heart size and mediastinal contours are within normal limits. Both lungs are clear. Sternotomy wires are noted over the midline. Surgical clips are present in the upper abdomen. No acute osseous abnormality.  PFT:    Latest Ref Rng & Units 10/30/2015   11:15 AM  PFT Results  FVC-Pre L 3.61   FVC-Predicted Pre % 94   FVC-Post L 3.65   FVC-Predicted Post % 95   Pre FEV1/FVC % % 81   Post FEV1/FCV % % 82   FEV1-Pre L 2.94   FEV1-Predicted Pre % 96   FEV1-Post L 3.00   DLCO uncorrected ml/min/mmHg 18.78   DLCO UNC% % 69   DLVA Predicted % 76   TLC L 6.06   TLC % Predicted % 113   RV % Predicted % 131     Labs:  Path:  Echo:  Heart Catheterization:     Assessment & Plan:   Shortness of breath - Plan: Pulmonary Function Test  Cigarette smoker - Plan: Ambulatory Referral for Lung Cancer Scre  Snoring  Fatigue, unspecified type  Chronic diarrhea - Plan: Ambulatory referral to Gastroenterology  Discussion: Annay Benak is a 57 year old woman, daily smoker with CAD, pulmonary emboli, DMII, HTN, GERD and asthma/COPD who is referred to  pulmonary clinic for hospital follow up for COPD exacerbation.   She is to continue Breztri inhaler 2 puffs twice daily.  She can use albuterol inhaler 1 to 2 puffs every 4-6 hours as needed.  She can use DuoNeb nebulizer treatments as needed.  I have recommended she quit smoking.  We discussed using nicotine replacement therapy with 14 mg nicotine patches daily and as needed mini nicotine lozenges 2 mg for breakthrough cravings.  I have provided her with the [facility] smoking cessation hotline number for further resources.  We discussed smoking cessation for greater than 3 minutes.  Given her history of fatigue, snoring and witnessed apneas at night we will schedule her for a home sleep study.  She reports history of chronic diarrhea  and referral to gastroenterology has been placed.  She qualifies for lung cancer screening with greater than 20-pack-year smoking history so referral has been placed to our lung cancer screening team.  She is to follow-up in 3 months with pulmonary function test.  Melody Comas, MD [facility] Office: [phone]    Current Outpatient Medications:    MELATONIN PO, Take 1 tablet by mouth at bedtime as needed (sleep)., Disp: , Rfl:    albuterol (VENTOLIN HFA) 108 (90 Base) MCG/ACT inhaler, Inhale 2 puffs into the lungs every 4 (four) hours as needed for wheezing or shortness of breath., Disp: 18 g, Rfl: 1   amLODipine (NORVASC) 5 MG tablet, Take 1 tablet (5 mg total) by mouth daily., Disp: 90 tablet, Rfl: 1   aspirin EC 81 MG tablet, Take 1 tablet (81 mg total) by mouth every evening., Disp: 30 tablet, Rfl: 11   blood glucose meter kit and supplies, Dispense based on patient and insurance preference. Use up to four times daily as directed. (FOR ICD-10 E10.9, E11.9)., Disp: 1 each, Rfl: 0   Budeson-Glycopyrrol-Formoterol (BREZTRI AEROSPHERE) 160-9-4.8 MCG/ACT AERO, Inhale into the lungs., Disp: , Rfl:    Calcium Carb-Cholecalciferol  (CALCIUM 600 + D PO), Take 1 tablet by mouth daily., Disp: , Rfl:    Continuous Blood Gluc Sensor (FREESTYLE LIBRE 3 SENSOR) MISC, Place 1 sensor on the skin every 14 days. Use to check glucose continuously, Disp: 2 each, Rfl: 22   cyclobenzaprine (FLEXERIL) 10 MG tablet, TAKE ONE TABLET BY MOUTH THREE TIMES DAILY AS NEEDED FOR MUSCLE SPASM, Disp: 30 tablet, Rfl: 0   diphenhydrAMINE (BENADRYL) 25 MG tablet, Take 25 mg by mouth every 6 (six) hours as needed for itching or allergies., Disp: , Rfl:    esomeprazole (NEXIUM) 40 MG capsule, Take 1 capsule (40 mg total) by mouth daily., Disp: 90 capsule, Rfl: 1   FLUoxetine (PROZAC) 40 MG capsule, Take 2 capsules (80 mg total) by mouth daily. TAKE ONE (1) CAPSULE EACH DAY (Patient taking differently: Take 40 mg by mouth daily.), Disp: 180 capsule, Rfl: 1   fluticasone (FLONASE) 50 MCG/ACT nasal spray, Place 2 sprays into both nostrils daily. (Patient taking differently: Place 2 sprays into both nostrils daily as needed for allergies.), Disp: 16 g, Rfl: 6   ibuprofen (ADVIL) 200 MG tablet, Take 400 mg by mouth every 6 (six) hours as needed for moderate pain., Disp: , Rfl:    ipratropium-albuterol (DUONEB) 0.5-2.5 (3) MG/3ML SOLN, Take 3 mLs by nebulization every 6 (six) hours as needed (Foir shortness of breath)., Disp: 360 mL, Rfl: 0   LORazepam (ATIVAN) 0.5 MG tablet, Take 1 tablet (0.5 mg total) by mouth every 8 (eight) hours as needed for anxiety., Disp: 90 tablet, Rfl: 2   magnesium oxide (MAG-OX) 400 MG tablet, Take 1 tablet (400 mg total) by mouth daily. (Patient taking differently: Take 800 mg by mouth 2 (two) times daily.), Disp: 90 tablet, Rfl: 1   metFORMIN (GLUCOPHAGE) 1000 MG tablet, Take 1 tablet (1,000 mg total) by mouth 2 (two) times daily., Disp: 180 tablet, Rfl: 1   metoprolol tartrate (LOPRESSOR) 50 MG tablet, Take 1 tablet (50 mg total) by mouth 2 (two) times daily., Disp: 180 tablet, Rfl: 1   nitroGLYCERIN (NITROSTAT) 0.4 MG SL tablet,  Place 1 tablet (0.4 mg total) under the tongue every 5 (five) minutes as needed for chest pain., Disp: 90 tablet, Rfl: 0   ondansetron (ZOFRAN-ODT) 4 MG disintegrating tablet, Take  1 tablet (4 mg total) by mouth every 8 (eight) hours as needed., Disp: 20 tablet, Rfl: 0   oxyCODONE (ROXICODONE) 5 MG immediate release tablet, Take 1 tablet (5 mg total) by mouth every 6 (six) hours as needed., Disp: 16 tablet, Rfl: 0   rosuvastatin (CRESTOR) 10 MG tablet, Take 1 tablet (10 mg total) by mouth daily., Disp: 90 tablet, Rfl: 3   Semaglutide,0.25 or 0.5MG /DOS, 2 MG/3ML SOPN, Inject 0.25mg  once a week for 4 weeks and then increase to 0.5mg  weekly, Disp: 3 mL, Rfl: 1

## 2023-05-29 NOTE — Patient Instructions (Addendum)
Continue breztri inhaler 2 puffs twice daily - rinse mouth out after each use  Use albuterol inhaler 1-2 puffs every 4-6 hours as needed  Use duoneb nebulizer treatments as needed  Recommend using nicotine replacement therapy to quit smoking. Use 14mg  nicotine patch daily and as needed mini nicotine lozenges 2mg  as needed for break through cravings.   Call 1-800-quit-NOW to get free nicotine replacement and counseling from the state of [place]   We will schedule you for pulmonary function testing at follow up visit  We will schedule you for home sleep study  We will refer you to our GI team for chronic diarrhea  We will refer you to our lung cancer screening team

## 2023-06-01 ENCOUNTER — Encounter: Payer: Self-pay | Admitting: Physician Assistant

## 2023-06-05 ENCOUNTER — Encounter: Payer: Self-pay | Admitting: Pulmonary Disease

## 2023-06-06 ENCOUNTER — Ambulatory Visit (HOSPITAL_COMMUNITY)
Admission: RE | Admit: 2023-06-06 | Discharge: 2023-06-06 | Disposition: A | Discharge status: Home / Self Care | Payer: No Typology Code available for payment source | Source: Ambulatory Visit | Attending: Cardiovascular Disease | Admitting: Cardiovascular Disease

## 2023-06-06 DIAGNOSIS — I25119 Atherosclerotic heart disease of native coronary artery with unspecified angina pectoris: Secondary | ICD-10-CM | POA: Insufficient documentation

## 2023-06-08 ENCOUNTER — Other Ambulatory Visit (HOSPITAL_COMMUNITY): Payer: Self-pay

## 2023-06-09 ENCOUNTER — Telehealth: Payer: Self-pay

## 2023-06-09 ENCOUNTER — Other Ambulatory Visit (HOSPITAL_COMMUNITY): Payer: Self-pay

## 2023-06-09 LAB — VAS US ABI WITH/WO TBI
Left ABI: 1.08
Right ABI: 1.17

## 2023-06-09 NOTE — Telephone Encounter (Signed)
Pharmacy Patient Advocate Encounter   Received notification from CoverMyMeds that prior authorization for Ozempic is required/requested.   Insurance verification completed.   The patient is insured through [facility] .   Per test claim: PA required; PA submitted to [facility] via CoverMyMeds Key/confirmation #/EOC [id number]    Status is pending

## 2023-06-13 ENCOUNTER — Other Ambulatory Visit (HOSPITAL_COMMUNITY): Payer: Self-pay

## 2023-06-16 ENCOUNTER — Other Ambulatory Visit (HOSPITAL_COMMUNITY): Payer: Self-pay

## 2023-06-16 NOTE — Telephone Encounter (Signed)
Pharmacy Patient Advocate Encounter  Received notification from [facility] that Prior Authorization for [facility] has been APPROVED from  to 8.23.25  PER THE PTS PREF'D PHARMACY: The patient has recently picked up Rybelsus tablets on  8/29. And per the test claim I've ran the patient must use  80% of the Rybelsus Rx before picking up Ozempic RX

## 2023-06-20 ENCOUNTER — Encounter: Payer: Self-pay | Admitting: Pharmacist

## 2023-06-21 ENCOUNTER — Ambulatory Visit
Payer: No Typology Code available for payment source | Attending: Cardiovascular Disease | Admitting: Cardiovascular Disease

## 2023-06-21 ENCOUNTER — Telehealth: Payer: Self-pay

## 2023-06-21 ENCOUNTER — Telehealth: Payer: Self-pay | Admitting: Pharmacist

## 2023-06-21 ENCOUNTER — Other Ambulatory Visit (HOSPITAL_COMMUNITY): Payer: Self-pay

## 2023-06-21 ENCOUNTER — Encounter: Payer: Self-pay | Admitting: Cardiovascular Disease

## 2023-06-21 VITALS — BP 175/88 | HR 74 | Ht 68.0 in | Wt 213.0 lb

## 2023-06-21 DIAGNOSIS — E669 Obesity, unspecified: Secondary | ICD-10-CM

## 2023-06-21 DIAGNOSIS — I25119 Atherosclerotic heart disease of native coronary artery with unspecified angina pectoris: Secondary | ICD-10-CM

## 2023-06-21 DIAGNOSIS — I10 Essential (primary) hypertension: Secondary | ICD-10-CM | POA: Diagnosis not present

## 2023-06-21 DIAGNOSIS — E782 Mixed hyperlipidemia: Secondary | ICD-10-CM

## 2023-06-21 DIAGNOSIS — G4733 Obstructive sleep apnea (adult) (pediatric): Secondary | ICD-10-CM | POA: Insufficient documentation

## 2023-06-21 DIAGNOSIS — Z720 Tobacco use: Secondary | ICD-10-CM

## 2023-06-21 NOTE — Assessment & Plan Note (Signed)
History of hyperlipidemia restarted on low-dose rosuvastatin 3 months ago however somewhat intolerant to this medication in the past.  Most recent lipid profile performed 11/29/2022 revealed total cholesterol 294, LDL 179 and HDL of 37.  She may be a candidate for a PCSK9.

## 2023-06-21 NOTE — Telephone Encounter (Signed)
Please complete PA for Repatha 140 or Praluent 75 mg. Diagnosis codes i25.119, e78.5

## 2023-06-21 NOTE — Assessment & Plan Note (Signed)
History of morbid obesity with a BMI of 32 currently on a diet.

## 2023-06-21 NOTE — Assessment & Plan Note (Signed)
Symptoms of obstructive sleep apnea followed by her pulmonologist.  His home sleep study was ordered but however she could not tolerate this.

## 2023-06-21 NOTE — Telephone Encounter (Signed)
PA request for Repatha has been Submitted. New Encounter created for follow up. For additional info see Pharmacy Prior Auth telephone encounter from 06/21/23.

## 2023-06-21 NOTE — Assessment & Plan Note (Signed)
History of essential hypertension blood pressure measured today at 175/88.  She has not taken her amlodipine yet today.  She is also on metoprolol.  She says at home after she takes her amlodipine, her blood pressure was within normal range.  She is somewhat noncompliant.

## 2023-06-21 NOTE — Assessment & Plan Note (Signed)
History of CAD status post cardiac catheterization by myself 10/29/2015 revealing 80% ostial left main with damping.  She underwent CABG x 1 by Dr. Dorris Fetch on 11/02/2015 with LIMA to her LAD.  Her postop course was complicated by HIT, DVT and pulmonary embolism for which she was on Xarelto.  She still complains of some shortness of breath and atypical chest pain.  She had a recent Myoview stress test performed 12/13/2022 which was low risk and nonischemic.

## 2023-06-21 NOTE — Patient Instructions (Signed)
Medication Instructions:  Your physician recommends that you continue on your current medications as directed. Please refer to the Current Medication list given to you today.  *If you need a refill on your cardiac medications before your next appointment, please call your pharmacy*    Follow-Up: At [facility], you and your health needs are our priority.  As part of our continuing mission to provide you with exceptional heart care, we have created designated Provider Care Teams.  These Care Teams include your primary Cardiologist (physician) and Advanced Practice Providers (APPs -  Physician Assistants and Nurse Practitioners) who all work together to provide you with the care you need, when you need it.  We recommend signing up for the patient portal called "MyChart".  Sign up information is provided on this After Visit Summary.  MyChart is used to connect with patients for Virtual Visits (Telemedicine).  Patients are able to view lab/test results, encounter notes, upcoming appointments, etc.  Non-urgent messages can be sent to your provider as well.   To learn more about what you can do with MyChart, go to [email or web address].    Your next appointment:   6 month(s)  Provider:   Jacolyn Reedy, PA-C        Then, Nanetta Batty, MD will plan to see you again in 12 month(s).

## 2023-06-21 NOTE — Assessment & Plan Note (Signed)
Ongoing tobacco abuse of 1 pack/day recalcitrant to risk factor modification. 

## 2023-06-21 NOTE — Progress Notes (Signed)
06/21/2023 Brenda Cruz   Mar 19, 1966  [id number]  Primary Physician Daphine Deutscher [name], [name] Primary Cardiologist: Runell Gess MD Brenda Cruz, [place]  HPI:  Brenda Cruz is a 57 y.o.  married Caucasian female, mother of 1,  with a history of GERD, newly diagnosed HLD/ HTN, anxiety, morbid obesity, continued tobacco abuse ( 1 PPD) and no past cardiac history who presented to [facility] ED today (10/29/15) with chest pain/SOB.  I last saw her in the office 07/02/2019.Marland Kitchen  She works at [employer] in [employer].   She was having substernal chest pain since November 2016 but worsening for the past 3 weeks. The chest pain is worse with minimal exertion and associated with SOB/nausea and relived at rest. Her chest pain is not reminiscent of her typical GERD symptoms. She was seen on 10/16/15 by her PCP for evaluation of chest pain. She was supposed to get a stress test, but this was never scheduled. She was newly started on a statin and lisinopril for elevated BPs in the office. However, she never started any of these medications because she "doesnt like taking pills." Additionally, her BP had normalized at home and she was afraid to take the medication for fear of hypotension. She routinely takes her BP at home and it seems to be quite labile.    She works at [employer] in [place] and has quite a bit of stress at work. This morning she became so SOB and had such severe chest pain that she felt like she might pass out. No syncope. She saw the PA at [facility] who advised that she present to the ED. She denies palpitations, LE edema, orthopnea or PND.  She does admit to snoring at night but no witnessed apneic episodes. She does not formally exercise and is very sedentary. At the end of our discussion, her husband and she mentioned that this all started after starting her new position at [employer]. She does not have these symptoms on the weekends or at home. For instance, she  walked up an incline in the snow last week and had minimal SOB with no chest pain. Additionally, she has a long history of anxiety and recently started taking her prozac again as well as her ativan.    Of note, she does have a family hx of CAD. Her father died of a massive heart attack at 71. Her mother has 6 siblings and every one of them had CAD with events in their 55s. Her sister has difficult to control HTN and had a heart cath in her 30s, but no stenting to their knowledge.  Because of her risk factors, the crescendo nature of her symptoms I decided to proceed with coronary angiography to define her anatomy and rule out an ischemic etiology. I performed carotid catheterization 10/29/15 revealing 80% distal left main stenosis.On 11/02/15 she underwent coronary artery bypass grafting X1 by Dr. Damien Fusi in with a LIMA to LAD. She did well postoperatively however she did come back one week later with a DVT, pulmonary embolus at community acquired pneumonia She has been oral anticoagulations since.   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  Over the last 4 to 6 weeks she is noticed new onset episodic chest pain.  Recent Myoview stress test was entirely normal.  In addition, she did develop breast cancer since I saw her last thanks and she has had surgery, chemotherapy and radiation  therapy.  She just had ventral hernia repair and had preoperative clearance evaluation by Wende Bushy, PA-C.  She had a Myoview stress test performed 12/13/2022 which was low risk and nonischemic.  Unfortunately, she still smokes a pack a day.  She was recently restarted on her statin drug several months ago but in the past has been intolerant.   No outpatient medications have been marked as taking for the 06/21/23 encounter (Office Visit) with Runell Gess, MD.     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   Sulfa Antibiotics Itching, Rash and Other (See Comments)    Social History   Socioeconomic History   Marital status: Married    Spouse name: Not on file   Number of children: 1   Years of education: Not on file   Highest education level: Not on file  Occupational History   Occupation: Medical Billing  Tobacco Use   Smoking status: Every Day    Current packs/day: 1.00    Types: Cigarettes   Smokeless tobacco: Never   Tobacco comments:    Patient has been smoking 38years//PAP 8/12  Vaping Use   Vaping status: Never Used  Substance and Sexual Activity   Alcohol use: No    Alcohol/week: 0.0 standard drinks of alcohol   Drug use: No   Sexual activity: Not on file  Other Topics Concern   Not on file  Social History Narrative   2 caffeine drinks daily    Social Determinants of Health   Financial Resource Strain: Not on file  Food Insecurity: Not on file  Transportation Needs: Not on file  Physical Activity: Not on file  Stress: Not on file  Social Connections: Not on file  Intimate Partner Violence: Not on file     Review of Systems: General:  negative for chills, fever, night sweats or weight changes.  Cardiovascular: negative for chest pain, dyspnea on exertion, edema, orthopnea, palpitations, paroxysmal nocturnal dyspnea or shortness of breath Dermatological: negative for rash Respiratory: negative for cough or wheezing Urologic: negative for hematuria Abdominal: negative for nausea, vomiting, diarrhea, bright red blood per rectum, melena, or hematemesis Neurologic: negative for visual changes, syncope, or dizziness All other systems reviewed and are otherwise negative except as noted above.    Blood pressure (!) 175/88, pulse 74, height 5\' 8"  (1.727 m), weight 213 lb (96.6 kg), last menstrual period 06/03/2017, SpO2 99%.  General appearance: alert and no distress Neck: no adenopathy, no carotid bruit, no JVD, supple, symmetrical, trachea midline, and thyroid not enlarged, symmetric, no tenderness/mass/nodules Lungs: clear  to auscultation bilaterally Heart: regular rate and rhythm, S1, S2 normal, no murmur, click, rub or gallop Extremities: extremities normal, atraumatic, no cyanosis or edema Pulses: 2+ and symmetric Skin: Skin color, texture, turgor normal. No rashes or lesions Neurologic: Grossly normal  EKG not performed today      ASSESSMENT AND PLAN:   Coronary artery disease involving native coronary artery of native heart with angina pectoris (HCC) History of CAD status post cardiac catheterization by myself 10/29/2015 revealing 80% ostial left main with damping.  She underwent CABG x 1 by Dr. Dorris Fetch on 11/02/2015 with LIMA to her LAD.  Her postop course was complicated by HIT, DVT and pulmonary embolism for which she was on Xarelto.  She still complains of some shortness of breath and atypical chest pain.  She had a recent Myoview stress test performed 12/13/2022 which was low risk and nonischemic.  Essential hypertension History of essential hypertension blood pressure measured today at 175/88.  She has not  taken her amlodipine yet today.  She is also on metoprolol.  She says at home after she takes her amlodipine, her blood pressure was within normal range.  She is somewhat noncompliant.  Hyperlipidemia History of hyperlipidemia restarted on low-dose rosuvastatin 3 months ago however somewhat intolerant to this medication in the past.  Most recent lipid profile performed 11/29/2022 revealed total cholesterol 294, LDL 179 and HDL of 37.  She may be a candidate for a PCSK9.  Obesity with body mass index of 30.0-39.9 History of morbid obesity with a BMI of 32 currently on a diet.  Tobacco abuse Ongoing tobacco abuse of 1 pack/day recalcitrant to risk factor modification.  Obstructive sleep apnea Symptoms of obstructive sleep apnea followed by her pulmonologist.  His home sleep study was ordered but however she could not tolerate this.     Runell Gess MD FACP,FACC,FAHA, [facility] 06/21/2023 3:39 PM

## 2023-06-21 NOTE — Telephone Encounter (Signed)
Pharmacy Patient Advocate Encounter   Received notification from Physician's Office that prior authorization for REPATHA is required/requested.   Insurance verification completed.   The patient is insured through [facility] .   Per test claim: PA required; PA submitted to [facility] via CoverMyMeds Key/confirmation #/EOC [id number] Status is pending

## 2023-06-22 ENCOUNTER — Other Ambulatory Visit (HOSPITAL_COMMUNITY): Payer: Self-pay

## 2023-06-22 NOTE — Telephone Encounter (Signed)
Pharmacy Patient Advocate Encounter  Received notification from [facility] that Prior Authorization for REPATHA has been APPROVED from 06/21/23 to 12/19/23. Ran test claim, Copay is $75 FOR 3 MONTHS. This test claim was processed through [facility]- copay amounts may vary at other pharmacies due to pharmacy/plan contracts, or as the patient moves through the different stages of their insurance plan.

## 2023-06-27 MED ORDER — REPATHA SURECLICK 140 MG/ML ~~LOC~~ SOAJ
1.0000 mL | SUBCUTANEOUS | 1 refills | Status: DC
Start: 2023-06-27 — End: 2023-11-07

## 2023-06-27 NOTE — Addendum Note (Signed)
Addended by: Cheree Ditto on: 06/27/2023 08:00 AM   Modules accepted: Orders

## 2023-07-04 ENCOUNTER — Ambulatory Visit: Payer: No Typology Code available for payment source | Admitting: Family Medicine

## 2023-07-04 VITALS — BP 145/102 | HR 95 | Temp 97.1°F | Ht 68.0 in | Wt 204.8 lb

## 2023-07-04 DIAGNOSIS — R1010 Upper abdominal pain, unspecified: Secondary | ICD-10-CM | POA: Diagnosis not present

## 2023-07-04 DIAGNOSIS — R110 Nausea: Secondary | ICD-10-CM | POA: Diagnosis not present

## 2023-07-04 MED ORDER — ONDANSETRON 8 MG PO TBDP: 8.0000 mg | ORAL_TABLET | Freq: Four times a day (QID) | ORAL | 1 refills | Status: DC | PRN

## 2023-07-04 MED ORDER — ONDANSETRON 4 MG PO TBDP
4.0000 mg | ORAL_TABLET | Freq: Once | ORAL | Status: AC
Start: 2023-07-04 — End: 2023-07-04
  Administered 2023-07-04: 4 mg via ORAL

## 2023-07-04 NOTE — Progress Notes (Addendum)
Chief Complaint  Patient presents with   Fatigue   Nausea    HPI  Patient presents today for loss of energy for 10 days. Tired, sleeping a lot. Nauseated. Feels like when she went though chemo. Covid Negative X 4.  All tests were the rapid. Was taking rebelsus. Dced three days ago. Abd pain BUQ. Had cholecystectomy.   PMH: Smoking status noted ROS: Review of Systems  Constitutional:  Positive for activity change, appetite change and fatigue.  HENT: Negative.    Respiratory:  Negative for chest tightness, shortness of breath and wheezing.   Cardiovascular:  Negative for chest pain and leg swelling.  Gastrointestinal:  Positive for abdominal distention, abdominal pain, diarrhea and nausea. Negative for blood in stool, constipation, rectal pain and vomiting.     Objective: BP (!) 145/102   Pulse 95   Temp (!) 97.1 F (36.2 C)   Ht 5\' 8"  (1.727 m)   Wt 204 lb 12.8 oz (92.9 kg)   LMP 06/03/2017 (Exact Date)   SpO2 100%   BMI 31.14 kg/m  Gen: NAD, alert, cooperative with exam HEENT: NCAT, EOMI, PERRL CV: RRR, good S1/S2, no murmur Resp: CTABL, no wheezes, non-labored Abd: SNTND, BS present, no guarding or organomegaly Ext: No edema, warm Neuro: Alert and oriented, No gross deficits  Assessment and plan:  1. Pain of upper abdomen     Meds ordered this encounter  Medications   ondansetron (ZOFRAN-ODT) 8 MG disintegrating tablet    Sig: Take 1 tablet (8 mg total) by mouth every 6 (six) hours as needed for nausea or vomiting.    Dispense:  20 tablet    Refill:  1    Orders Placed This Encounter  Procedures   Urine Culture   US Abdomen Complete    Standing Status:   Future    Standing Expiration Date:   08/03/2023    Order Specific Question:   Reason for Exam (SYMPTOM  OR DIAGNOSIS REQUIRED)    Answer:   diffuse pain    Order Specific Question:   Preferred imaging location?    Answer:   [facility]   CBC with Differential/Platelet   CMP14+EGFR    Order  Specific Question:   Has the patient fasted?    Answer:   Yes    Order Specific Question:   Release to patient    Answer:   Immediate   Magnesium   Phosphorus   Lipase   Amylase   Urinalysis    Follow up 48 hours  Mechele Claude, MD

## 2023-07-04 NOTE — Addendum Note (Signed)
Addended by: Adella Hare B on: 07/04/2023 05:01 PM   Modules accepted: Orders

## 2023-07-04 NOTE — Addendum Note (Signed)
Addended by: Mechele Claude on: 07/04/2023 04:53 PM   Modules accepted: Orders

## 2023-07-05 ENCOUNTER — Encounter: Payer: Self-pay | Admitting: Family Medicine

## 2023-07-05 ENCOUNTER — Encounter: Payer: Self-pay | Admitting: *Deleted

## 2023-07-05 ENCOUNTER — Ambulatory Visit (HOSPITAL_COMMUNITY)
Admission: RE | Admit: 2023-07-05 | Discharge: 2023-07-05 | Disposition: A | Discharge status: Home / Self Care | Payer: No Typology Code available for payment source | Source: Ambulatory Visit | Attending: Family Medicine | Admitting: Family Medicine

## 2023-07-05 ENCOUNTER — Other Ambulatory Visit: Payer: Self-pay | Admitting: Family Medicine

## 2023-07-05 DIAGNOSIS — R1010 Upper abdominal pain, unspecified: Secondary | ICD-10-CM | POA: Diagnosis present

## 2023-07-05 DIAGNOSIS — K760 Fatty (change of) liver, not elsewhere classified: Secondary | ICD-10-CM | POA: Insufficient documentation

## 2023-07-05 LAB — CBC WITH DIFFERENTIAL/PLATELET
Basophils Absolute: 0.1 10*3/uL (ref 0.0–0.2)
Basos: 1 %
EOS (ABSOLUTE): 0.2 10*3/uL (ref 0.0–0.4)
Eos: 2 %
Hematocrit: 41.5 % (ref 34.0–46.6)
Hemoglobin: 13.5 g/dL (ref 11.1–15.9)
Immature Grans (Abs): 0.1 10*3/uL (ref 0.0–0.1)
Immature Granulocytes: 1 %
Lymphocytes Absolute: 3.1 10*3/uL (ref 0.7–3.1)
Lymphs: 28 %
MCH: 30.1 pg (ref 26.6–33.0)
MCHC: 32.5 g/dL (ref 31.5–35.7)
MCV: 93 fL (ref 79–97)
Monocytes Absolute: 0.9 10*3/uL (ref 0.1–0.9)
Monocytes: 9 %
Neutrophils Absolute: 6.6 10*3/uL (ref 1.4–7.0)
Neutrophils: 59 %
Platelets: 272 10*3/uL (ref 150–450)
RBC: 4.48 x10E6/uL (ref 3.77–5.28)
RDW: 12.2 % (ref 11.7–15.4)
WBC: 10.9 10*3/uL — ABNORMAL HIGH (ref 3.4–10.8)

## 2023-07-05 LAB — CMP14+EGFR
ALT: 47 IU/L — ABNORMAL HIGH (ref 0–32)
AST: 63 IU/L — ABNORMAL HIGH (ref 0–40)
Albumin: 4.5 g/dL (ref 3.8–4.9)
Alkaline Phosphatase: 97 IU/L (ref 44–121)
BUN/Creatinine Ratio: 7 — ABNORMAL LOW (ref 9–23)
BUN: 9 mg/dL (ref 6–24)
Bilirubin Total: 0.3 mg/dL (ref 0.0–1.2)
CO2: 21 mmol/L (ref 20–29)
Calcium: 9.4 mg/dL (ref 8.7–10.2)
Chloride: 98 mmol/L (ref 96–106)
Creatinine, Ser: 1.27 mg/dL — ABNORMAL HIGH (ref 0.57–1.00)
Globulin, Total: 2.8 g/dL (ref 1.5–4.5)
Glucose: 172 mg/dL — ABNORMAL HIGH (ref 70–99)
Potassium: 4.5 mmol/L (ref 3.5–5.2)
Sodium: 139 mmol/L (ref 134–144)
Total Protein: 7.3 g/dL (ref 6.0–8.5)
eGFR: 50 mL/min/{1.73_m2} — ABNORMAL LOW (ref >59)

## 2023-07-05 LAB — PHOSPHORUS: Phosphorus: 4.6 mg/dL — ABNORMAL HIGH (ref 3.0–4.3)

## 2023-07-05 LAB — AMYLASE: Amylase: 41 U/L (ref 31–110)

## 2023-07-05 LAB — MAGNESIUM: Magnesium: 1.6 mg/dL (ref 1.6–2.3)

## 2023-07-05 LAB — LIPASE: Lipase: 31 U/L (ref 14–72)

## 2023-07-05 NOTE — Progress Notes (Signed)
Hello Laneisha,  Your lab result is normal and/or stable.Some minor variations that are not significant are commonly marked abnormal, but do not represent any medical problem for you.  Best regards, Mechele Claude, M.D.

## 2023-07-06 ENCOUNTER — Encounter (HOSPITAL_BASED_OUTPATIENT_CLINIC_OR_DEPARTMENT_OTHER): Payer: No Typology Code available for payment source

## 2023-07-06 ENCOUNTER — Ambulatory Visit: Payer: No Typology Code available for payment source | Attending: Nurse Practitioner

## 2023-07-06 DIAGNOSIS — R0683 Snoring: Secondary | ICD-10-CM

## 2023-07-06 DIAGNOSIS — G4733 Obstructive sleep apnea (adult) (pediatric): Secondary | ICD-10-CM

## 2023-07-06 DIAGNOSIS — R5383 Other fatigue: Secondary | ICD-10-CM

## 2023-07-06 LAB — URINE CULTURE

## 2023-07-06 NOTE — Progress Notes (Deleted)
Patient ID: Brenda Cruz                 DOB: 12-Dec-1965                    MRN: [id number]      HPI: Brenda Cruz is a 57 y.o. female patient of Dr. Allyson Sabal referred to lipid clinic by [place] Scrape. 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  Patient was seen by Marcelino Duster seeking clearance for abdominal surgery.  Found to not be on any cholesterol medication.  Blood pressure elevated, blood sugar uncontrolled and patient still smoking.  She was referred to the lipid clinic.  I saw the patient back in February 2024.  We discussed multitude of chronic medical conditions including her blood pressure, cholesterol and smoking sensation.  She was started on rosuvastatin 10 mg daily and we had also plan to transition her from Rybelsus to Ozempic.  We discussed smoking cessation and the plan was to add Wellbutrin at next follow-up.  We also talked again about the importance of exercise.  Was also able to get patient approved for a CGM.  ABIs were performed due to complaints of leg and groin pain.  Luckily these were normal.  She had hernia surgery in March and had been lost to follow-up for some time despite attempts to contact her in April.  She was seen by Dr. Gery Pray in September.  Blood pressure at that visit was elevated however patient had not taken her amlodipine yet.  She reports blood pressure at home well-controlled.  Still smoking 1 pack/day.  At that visit she spoke with one of our Pharm.D.'s and was started on Repatha.   Compliance? Smoking sensation Still on rosuvastatin? How she tolerating Repatha Checking blood pressure at home? Ultrasound showed fatty liver Denied Dexcom  Patient presents today to the Pharm.D. clinic.  She is very open to changes.  She knows that her conditions are not well-controlled and that she is at high risk of another cardiovascular event.   Currently on Rybelsus for her blood sugar.  She feels like it is causing changes in taste and smell.  She also has a lot of nausea and no desire to eat.  She was previously on Rybelsus and lost about 20 pounds and her A1c improved significantly.  She drinks 5-20 ounce Mountain Dew's per day.  She generally eats a bag of chips for lunch.  Does not really eat breakfast.  Her husband cooks dinner which is usually a meat vegetable and starch.  She knows that she is consuming too much sugar and calories for her drinks and that she needs to drink more water.  She reports pain in her leg and groin.  Concerned that it might be PVD as her father also had this complication and she has been smoking for a long time.  She has tried to quit smoking in the past.  Quit for 3 months but she was very moody and agitated and her work told her that she either needed to start smoking again or leave.  She knows it is very important for her to quit but she is not confident she can do it.  She has tried Wellbutrin, hypnosis, nicotine replacement with patches and gum in the past without success.  She has not tried Chantix but her  husband was on Chantix and she did not like how it affected him and is scared to try.  She recently lost her mother and had a bout of depression and is scared that this will become worse with Chantix.  Willing to try Wellbutrin again.  She does not exercise, but she knows she needs to.  She does not feel like she will be able to exercise in the morning but does think that she can get her son and her husband to hold her accountable and walk in the afternoon or evening.  She is hoping her son and husband will walk with her as she feels as though they would both benefit.  We did talk about how if she had PAD that walking can help improve this.  She recalls being on rosuvastatin in the past.  She said that she does not think that she really gave it a shot.  She tolerated the lower dose but when it was increased  she had chest tightness and she stopped taking it.  Patient is willing to rechallenge.  She is also willing to do injections if needed.   Current Medications: Repatha 140mg  Intolerances: atorvastatin (80mg ), simvastatin (body/leg aches), rosuvastatin (10,20,40) Risk Factors: premature CAD, DM, HTN LDL-C goal: <55 ApoB goal: <70 Non-HDL goal: <80  Diet: meat, vegetable, starch Breakfast Lunch: chips  Drink: mountain dew (5 20oz per day)  Exercise: none  Family History:  Family History  Problem Relation Age of Onset   Colon polyps Father    Stomach cancer Paternal Grandmother    Esophageal cancer Maternal Grandfather    Breast cancer Mother    Ovarian cancer Other        Maternal Side Great  Aunt   Colon cancer Neg Hx    Social History: + tobacco  Labs: Lipid Panel     Component Value Date/Time   CHOL 294 (H) 11/29/2022 0918   TRIG 394 (H) 11/29/2022 0918   HDL 37 (L) 11/29/2022 0918   CHOLHDL 7.9 (H) 11/29/2022 0918   CHOLHDL 8.1 10/30/2015 0300   VLDL 29 10/30/2015 0300   LDLCALC 179 (H) 11/29/2022 0918   LABVLDL 78 (H) 11/29/2022 0918    Past Medical History:  Diagnosis Date   Anxiety    Asthma    COPD (chronic obstructive pulmonary disease) (HCC)    Coronary artery disease    DDD (degenerative disc disease)    GERD (gastroesophageal reflux disease)    Hemorrhoids    HIT (heparin-induced thrombocytopenia) (HCC)    Hyperlipemia    Hypertension    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) 12/15/2017   Iron deficiency anemia 11/23/2015   Myocardial infarction [facility]) 2019   widow maker   PONV (postoperative nausea and vomiting)    Pulmonary emboli (HCC) 12/07/2015   [place]y [place] spotted fever    TMJ (temporomandibular joint disorder)    Type 2 diabetes mellitus (HCC) 12/15/2017    Current Outpatient Medications on File Prior to Visit  Medication Sig Dispense Refill   albuterol (VENTOLIN HFA) 108 (90 Base) MCG/ACT inhaler Inhale 2 puffs into  the lungs every 4 (four) hours as needed for wheezing or shortness of breath. 18 g 1   amLODipine (NORVASC) 5 MG tablet Take 1 tablet (5 mg total) by mouth daily. 90 tablet 1   aspirin EC 81 MG tablet Take 1 tablet (81 mg total) by mouth every evening. 30 tablet 11   blood glucose meter kit and supplies Dispense based on patient  and insurance preference. Use up to four times daily as directed. (FOR ICD-10 E10.9, E11.9). 1 each 0   Budeson-Glycopyrrol-Formoterol (BREZTRI AEROSPHERE) 160-9-4.8 MCG/ACT AERO Inhale into the lungs.     Calcium Carb-Cholecalciferol (CALCIUM 600 + D PO) Take 1 tablet by mouth daily.     cyclobenzaprine (FLEXERIL) 10 MG tablet TAKE ONE TABLET BY MOUTH THREE TIMES DAILY AS NEEDED FOR MUSCLE SPASM 30 tablet 0   diphenhydrAMINE (BENADRYL) 25 MG tablet Take 25 mg by mouth every 6 (six) hours as needed for itching or allergies.     esomeprazole (NEXIUM) 40 MG capsule Take 1 capsule (40 mg total) by mouth daily. 90 capsule 1   Evolocumab (REPATHA SURECLICK) 140 MG/ML SOAJ Inject 140 mg into the skin every 14 (fourteen) days. 6 mL 1   fluticasone (FLONASE) 50 MCG/ACT nasal spray Place 2 sprays into both nostrils daily. (Patient taking differently: Place 2 sprays into both nostrils daily as needed for allergies.) 16 g 6   ibuprofen (ADVIL) 200 MG tablet Take 400 mg by mouth every 6 (six) hours as needed for moderate pain.     ipratropium-albuterol (DUONEB) 0.5-2.5 (3) MG/3ML SOLN Take 3 mLs by nebulization every 6 (six) hours as needed (Foir shortness of breath). 360 mL 0   LORazepam (ATIVAN) 0.5 MG tablet Take 1 tablet (0.5 mg total) by mouth every 8 (eight) hours as needed for anxiety. 90 tablet 2   magnesium oxide (MAG-OX) 400 MG tablet Take 1 tablet (400 mg total) by mouth daily. (Patient taking differently: Take 800 mg by mouth 2 (two) times daily.) 90 tablet 1   MELATONIN PO Take 1 tablet by mouth at bedtime as needed (sleep).     metFORMIN (GLUCOPHAGE) 1000 MG tablet Take 1  tablet (1,000 mg total) by mouth 2 (two) times daily. 180 tablet 1   metoprolol tartrate (LOPRESSOR) 50 MG tablet Take 1 tablet (50 mg total) by mouth 2 (two) times daily. 180 tablet 1   nitroGLYCERIN (NITROSTAT) 0.4 MG SL tablet Place 1 tablet (0.4 mg total) under the tongue every 5 (five) minutes as needed for chest pain. 90 tablet 0   ondansetron (ZOFRAN-ODT) 8 MG disintegrating tablet Take 1 tablet (8 mg total) by mouth every 6 (six) hours as needed for nausea or vomiting. 20 tablet 1   oxyCODONE (ROXICODONE) 5 MG immediate release tablet Take 1 tablet (5 mg total) by mouth every 6 (six) hours as needed. 16 tablet 0   No current facility-administered medications on file prior to visit.    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   Sulfa Antibiotics Itching, Rash and Other (See Comments)    Assessment/Plan:  1. Hyperlipidemia -  No problem-specific Assessment & Plan notes found for this encounter.     Thank you,  Olene Floss, Pharm.D, BCPS, CPP [facility] A Division of  [facility] [address] [place], [place] [address]  Phone: [phone]; Fax: [phone]

## 2023-07-06 NOTE — Telephone Encounter (Signed)
Please add Vitamin D level.

## 2023-07-08 LAB — SPECIMEN STATUS REPORT

## 2023-07-08 LAB — VITAMIN D 25 HYDROXY (VIT D DEFICIENCY, FRACTURES): Vit D, 25-Hydroxy: 28 ng/mL — ABNORMAL LOW (ref 30.0–100.0)

## 2023-07-09 NOTE — Progress Notes (Signed)
Dear Brenda Cruz, Your Vitamin D is  low. You need a prescription strength supplement I will send that in for you. Nurse, if at all possible, could you send in a prescription for the patient for vitamin D 50,000 units, 1 p.o. weekly #13 with 3 refills? Many thanks, WS

## 2023-07-10 MED ORDER — VITAMIN D (ERGOCALCIFEROL) 1.25 MG (50000 UNIT) PO CAPS: 50000.0000 [IU] | ORAL_CAPSULE | ORAL | 3 refills | Status: AC

## 2023-07-10 NOTE — Addendum Note (Signed)
Addended by: Lorelee Cover C on: 07/10/2023 11:28 AM   Modules accepted: Orders

## 2023-08-03 ENCOUNTER — Ambulatory Visit: Payer: No Typology Code available for payment source | Admitting: Nurse Practitioner

## 2023-08-03 ENCOUNTER — Encounter: Payer: Self-pay | Admitting: Nurse Practitioner

## 2023-08-03 ENCOUNTER — Ambulatory Visit: Payer: No Typology Code available for payment source

## 2023-08-03 VITALS — BP 159/76 | HR 81 | Temp 97.5°F | Ht 68.0 in | Wt 209.4 lb

## 2023-08-03 DIAGNOSIS — T7840XA Allergy, unspecified, initial encounter: Secondary | ICD-10-CM

## 2023-08-03 DIAGNOSIS — L659 Nonscarring hair loss, unspecified: Secondary | ICD-10-CM | POA: Diagnosis not present

## 2023-08-03 DIAGNOSIS — R21 Rash and other nonspecific skin eruption: Secondary | ICD-10-CM | POA: Diagnosis not present

## 2023-08-03 MED ORDER — CLOBETASOL PROPIONATE 0.05 % EX CREA: 1.0000 | TOPICAL_CREAM | Freq: Two times a day (BID) | CUTANEOUS | 0 refills | Status: DC

## 2023-08-03 MED ORDER — METHYLPREDNISOLONE ACETATE 40 MG/ML IJ SUSP
40.0000 mg | Freq: Once | INTRAMUSCULAR | Status: AC
Start: 2023-08-03 — End: 2023-08-03
  Administered 2023-08-03: 40 mg via INTRAMUSCULAR

## 2023-08-03 NOTE — Progress Notes (Signed)
Acute Office Visit  Subjective:     Patient ID: Brenda Cruz, female    DOB: 12-29-1965, 57 y.o.   MRN: [id number]  Chief Complaint  Patient presents with   Rash    Rash on chest and back started on Monday. Very itchy and tingling. Has tried no new products   Adenopathy    Swollen lymph nodes started Saturday     HPI Brenda Cruz present on 08/03/23 fo an acute visit concerns for rash. patient reports experiencing hair loss for approximately 2 months. She attributes this change to an allergic reaction following a hair dye application, which resulted in swollen lymph nodes. To manage her symptoms, she took leftover steroids for 4 days. However, since Monday, she has developed a rash, which raises concerns about a potential adverse reaction to the steroids or the hair dye. The patient is seeking evaluation and recommendations for her hair loss and rash. She reports trying cortisone cream, benadryl with no relieve". Reports that rash "itchy and tingle/burns"  Review of Systems  Constitutional:  Negative for chills and fever.  HENT:  Negative for congestion and sore throat.   Respiratory:  Negative for cough and shortness of breath.   Cardiovascular:  Negative for chest pain and leg swelling.  Gastrointestinal:  Negative for blood in stool, constipation, melena, nausea and vomiting.  Skin:  Positive for itching.  Neurological:  Negative for dizziness and headaches.  Endo/Heme/Allergies:  Negative for environmental allergies and polydipsia. Does not bruise/bleed easily.   Negative unless indicated in HPI    Objective:    BP (!) 159/76   Pulse 81   Temp (!) 97.5 F (36.4 C) (Temporal)   Ht 5\' 8"  (1.727 m)   Wt 209 lb 6.4 oz (95 kg)   LMP 06/03/2017 (Exact Date)   SpO2 99%   BMI 31.84 kg/m  BP Readings from Last 3 Encounters:  08/03/23 (!) 159/76  07/04/23 (!) 145/102  06/21/23 (!) 175/88   Wt Readings from Last 3 Encounters:  08/03/23 209 lb 6.4 oz (95 kg)   07/04/23 204 lb 12.8 oz (92.9 kg)  06/21/23 213 lb (96.6 kg)      Physical Exam Vitals and nursing note reviewed.  Constitutional:      Appearance: She is obese.  HENT:     Head: Normocephalic and atraumatic.  Eyes:     General: No scleral icterus.    Extraocular Movements: Extraocular movements intact.     Conjunctiva/sclera: Conjunctivae normal.     Pupils: Pupils are equal, round, and reactive to light.  Cardiovascular:     Rate and Rhythm: Normal rate and regular rhythm.  Pulmonary:     Effort: Pulmonary effort is normal.     Breath sounds: Normal breath sounds.  Abdominal:     General: Bowel sounds are normal.     Palpations: Abdomen is soft.  Musculoskeletal:        General: Normal range of motion.     Cervical back: Neck supple.     Right lower leg: No edema.     Left lower leg: No edema.  Skin:    Findings: Rash present. Rash is macular and purpuric.       Neurological:     Mental Status: She is alert.     No results found for any visits on 08/03/23.      Assessment & Plan:  Hair loss -     Thyroid Panel With TSH  Rash due to allergy -  methylPREDNISolone Acetate -     Clobetasol Propionate; Apply 1 Application topically 2 (two) times daily.  Dispense: 60 g; Refill: 0   [name]  is 57 yrs old female, no acute distress Rash: Methylprednisolone 40 mg IM injection and clobetasol cream BID Hair loss: TSH The above assessment and management plan was discussed with the patient. The patient verbalized understanding of and has agreed to the management plan. Patient is aware to call the clinic if they develop any new symptoms or if symptoms persist or worsen. Patient is aware when to return to the clinic for a follow-up visit. Patient educated on when it is appropriate to go to the emergency department.  Return if symptoms worsen or fail to improve, for follow-up.  Arrie Aran Santa Lighter, DNP [facility] [address] [place], [place] [address] [phone]

## 2023-08-04 LAB — THYROID PANEL WITH TSH
Free Thyroxine Index: 1.7 (ref 1.2–4.9)
T3 Uptake Ratio: 22 % — ABNORMAL LOW (ref 24–39)
T4, Total: 7.9 ug/dL (ref 4.5–12.0)
TSH: 1.38 u[IU]/mL (ref 0.450–4.500)

## 2023-08-08 ENCOUNTER — Encounter: Payer: Self-pay | Admitting: Nurse Practitioner

## 2023-08-08 ENCOUNTER — Ambulatory Visit: Payer: No Typology Code available for payment source | Admitting: Nurse Practitioner

## 2023-08-08 ENCOUNTER — Telehealth: Payer: Self-pay

## 2023-08-08 VITALS — BP 175/94 | HR 85 | Temp 98.1°F | Resp 20 | Ht 68.0 in | Wt 209.0 lb

## 2023-08-08 DIAGNOSIS — L309 Dermatitis, unspecified: Secondary | ICD-10-CM | POA: Diagnosis not present

## 2023-08-08 DIAGNOSIS — G4733 Obstructive sleep apnea (adult) (pediatric): Secondary | ICD-10-CM

## 2023-08-08 DIAGNOSIS — F411 Generalized anxiety disorder: Secondary | ICD-10-CM

## 2023-08-08 MED ORDER — ESCITALOPRAM OXALATE 10 MG PO TABS
10.0000 mg | ORAL_TABLET | Freq: Every day | ORAL | 5 refills | Status: DC
Start: 2023-08-08 — End: 2023-11-07

## 2023-08-08 MED ORDER — PREDNISONE 20 MG PO TABS
ORAL_TABLET | ORAL | 0 refills | Status: DC
Start: 2023-08-08 — End: 2023-08-14

## 2023-08-08 MED ORDER — METHYLPREDNISOLONE ACETATE 80 MG/ML IJ SUSP
80.0000 mg | Freq: Once | INTRAMUSCULAR | Status: AC
Start: 2023-08-08 — End: 2023-08-08
  Administered 2023-08-08: 80 mg via INTRAMUSCULAR

## 2023-08-08 NOTE — Progress Notes (Signed)
Subjective:    Patient ID: Brenda Cruz, female    DOB: May 18, 1966, 57 y.o.   MRN: [id number]   Chief Complaint: Rash   Rash Pertinent negatives include no eye pain or shortness of breath.   Patient was seen last week with rash. She was treated with depomedrol 40mg  injection and clobetasol cream. Rash is no better. Very itchy. She has been very anxious and thinks she needs to go back on something. She use to be on prozac.    08/03/2023    2:44 PM 03/09/2023   12:03 PM 11/29/2022    9:21 AM 11/18/2022    9:48 AM  GAD 7 : Generalized Anxiety Score  Nervous, Anxious, on Edge 2 1 1 2   Control/stop worrying 0 1 1 2   Worry too much - different things 0 0 1 2  Trouble relaxing 3 0 1 2  Restless 0 0 0 1  Easily annoyed or irritable 0 0 1 2  Afraid - awful might happen 0 0 1 2  Total GAD 7 Score 5 2 6 13   Anxiety Difficulty Somewhat difficult Not difficult at all Somewhat difficult Somewhat difficult       08/03/2023    2:43 PM 07/04/2023    3:30 PM 03/09/2023   12:03 PM  Depression screen PHQ 2/9  Decreased Interest 1 0 0  Down, Depressed, Hopeless 1 0 0  PHQ - 2 Score 2 0 0  Altered sleeping 1  1  Tired, decreased energy 3  1  Change in appetite 0  0  Feeling bad or failure about yourself  0  0  Trouble concentrating 1  0  Moving slowly or fidgety/restless 0  0  Suicidal thoughts 0  0  PHQ-9 Score 7  2  Difficult doing work/chores Not difficult at all  Not difficult at all    Patient Active Problem List   Diagnosis Date Noted   Hair loss 08/03/2023   Obstructive sleep apnea 06/21/2023   Ventral hernia without obstruction or gangrene 01/11/2023   Inflammatory disorder of digestive system 01/17/2022   Screen for colon cancer 11/24/2020   History of cancer of left breast 11/24/2020   History of external beam radiation therapy 11/24/2020   History of lumpectomy of left breast 11/24/2020   Tobacco abuse 07/02/2019   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)  12/15/2017   Type 2 diabetes mellitus (HCC) 12/15/2017   Obesity with body mass index of 30.0-39.9 12/04/2017   Hyperlipidemia 03/15/2017   Stress incontinence of urine 12/07/2016   COPD (chronic obstructive pulmonary disease) (HCC) 12/07/2016   Hx of pulmonary embolus 12/07/2015   Iron deficiency anemia 11/23/2015   S/P CABG x 1 11/02/2015   Coronary artery disease involving native coronary artery of native heart with angina pectoris (HCC)    Essential hypertension 10/16/2015   GERD (gastroesophageal reflux disease) 05/16/2013   Depression 05/16/2013   GAD (generalized anxiety disorder) 05/16/2013       Review of Systems  Constitutional:  Negative for diaphoresis.  Eyes:  Negative for pain.  Respiratory:  Negative for shortness of breath.   Cardiovascular:  Negative for chest pain, palpitations and leg swelling.  Gastrointestinal:  Negative for abdominal pain.  Endocrine: Negative for polydipsia.  Skin:  Positive for rash.  Neurological:  Negative for dizziness, weakness and headaches.  Hematological:  Does not bruise/bleed easily.  All other systems reviewed and are negative.      Objective:   Physical Exam Vitals  and nursing note reviewed.  Constitutional:      General: She is not in acute distress.    Appearance: Normal appearance. She is well-developed.  Neck:     Vascular: No carotid bruit or JVD.  Cardiovascular:     Rate and Rhythm: Normal rate and regular rhythm.     Heart sounds: Normal heart sounds.  Pulmonary:     Effort: Pulmonary effort is normal. No respiratory distress.     Breath sounds: Normal breath sounds. No wheezing or rales.  Chest:     Chest wall: No tenderness.  Abdominal:     General: Bowel sounds are normal. There is no distension or abdominal bruit.     Palpations: Abdomen is soft. There is no hepatomegaly, splenomegaly, mass or pulsatile mass.     Tenderness: There is no abdominal tenderness.  Musculoskeletal:        General: Normal  range of motion.     Cervical back: Normal range of motion and neck supple.  Lymphadenopathy:     Cervical: No cervical adenopathy.  Skin:    General: Skin is warm and dry.     Comments: Erythematous maculo papular lesions on trunk and arms  Neurological:     Mental Status: She is alert and oriented to person, place, and time.     Deep Tendon Reflexes: Reflexes are normal and symmetric.  Psychiatric:        Behavior: Behavior normal.        Thought Content: Thought content normal.        Judgment: Judgment normal.    BP (!) 175/94   Pulse 85   Temp 98.1 F (36.7 C) (Temporal)   Resp 20   Ht 5\' 8"  (1.727 m)   Wt 209 lb (94.8 kg)   LMP 06/03/2017 (Exact Date)   SpO2 99%   BMI 31.78 kg/m         Assessment & Plan:   Lin Givens in today with chief complaint of Rash   1. GAD (generalized anxiety disorder) Stress management - escitalopram (LEXAPRO) 10 MG tablet; Take 1 tablet (10 mg total) by mouth daily.  Dispense: 30 tablet; Refill: 5  2. Dermatitis Avoid scratching Continue claritin or benadryl - methylPREDNISolone acetate (DEPO-MEDROL) injection 80 mg - predniSONE (DELTASONE) 20 MG tablet; 2 po at sametime daily for 5 days-  Dispense: 10 tablet; Refill: 0    The above assessment and management plan was discussed with the patient. The patient verbalized understanding of and has agreed to the management plan. Patient is aware to call the clinic if symptoms persist or worsen. Patient is aware when to return to the clinic for a follow-up visit. Patient educated on when it is appropriate to go to the emergency department.   [name] Daphine Deutscher, [name]

## 2023-08-08 NOTE — Telephone Encounter (Signed)
Patient was in the office today for sick visit and asked about her sleep study that was performed in August. PCP reviewed with patient and would like to see if patient can be referred for a CPAP or what you all feel the next step should be. Please contact patient

## 2023-08-08 NOTE — Patient Instructions (Signed)
Atopic Dermatitis ?Atopic dermatitis is a skin disorder that causes inflammation of the skin. It is marked by a red rash and itchy, dry, scaly skin. It is the most common type of eczema. Eczema is a group of skin conditions that cause the skin to become rough and swollen. This condition is generally worse during the cooler winter months and often improves during the warm summer months. ?Atopic dermatitis usually starts showing signs in infancy and can last through adulthood. This condition cannot be passed from one person to another (is not contagious). Atopic dermatitis may not always be present, but when it is, it is called a flare-up. ?What are the causes? ?The exact cause of this condition is not known. Flare-ups may be triggered by: ?Coming in contact with something that you are sensitive or allergic to (allergen). ?Stress. ?Certain foods. ?Extremely hot or cold weather. ?Harsh chemicals and soaps. ?Dry air. ?Chlorine. ?What increases the risk? ?This condition is more likely to develop in people who have a personal or family history of: ?Eczema. ?Allergies. ?Asthma. ?Hay fever. ?What are the signs or symptoms? ?Symptoms of this condition include: ?Dry, scaly skin. ?Red, itchy rash. ?Itchiness, which can be severe. This may occur before the skin rash. This can make sleeping difficult. ?Skin thickening and cracking that can occur over time. ?How is this diagnosed? ?This condition is diagnosed based on: ?Your symptoms. ?Your medical history. ?A physical exam. ?How is this treated? ?There is no cure for this condition, but symptoms can usually be controlled. Treatment focuses on: ?Controlling the itchiness and scratching. You may be given medicines, such as antihistamines or steroid creams. ?Limiting exposure to allergens. ?Recognizing situations that cause stress and developing a plan to manage stress. ?If your atopic dermatitis does not get better with medicines, or if it is all over your body (widespread), a  treatment using a specific type of light (phototherapy) may be used. ?Follow these instructions at home: ?Skin care ? ?Keep your skin well moisturized. Doing this seals in moisture and helps to prevent dryness. ?Use unscented lotions that have petroleum in them. ?Avoid lotions that contain alcohol or water. They can dry the skin. ?Keep baths or showers short (less than 5 minutes) in warm water. Do not use hot water. ?Use mild, unscented cleansers for bathing. Avoid soap and bubble bath. ?Apply a moisturizer to your skin right after a bath or shower. ?Do not apply anything to your skin without checking with your health care provider. ?General instructions ?Take or apply over-the-counter and prescription medicines only as told by your health care provider. ?Dress in clothes made of cotton or cotton blends. Dress lightly because heat increases itchiness. ?When washing your clothes, rinse your clothes twice so all of the soap is removed. ?Avoid any triggers that can cause a flare-up. ?Keep your fingernails cut short. ?Avoid scratching. Scratching makes the rash and itchiness worse. A break in the skin from scratching could result in a skin infection (impetigo). ?Do not be around people who have cold sores or fever blisters. If you get the infection, it may cause your atopic dermatitis to worsen. ?Keep all follow-up visits. This is important. ?Contact a health care provider if: ?Your itchiness interferes with sleep. ?Your rash gets worse or is not better within one week of starting treatment. ?You have a fever. ?You have a rash flare-up after having contact with someone who has cold sores or fever blisters. ?Get help right away if: ?You develop pus or soft yellow scabs in the rash   area. ?Summary ?Atopic dermatitis causes a red rash and itchy, dry, scaly skin. ?Treatment focuses on controlling the itchiness and scratching, limiting exposure to things that you are sensitive or allergic to (allergens), recognizing  situations that cause stress, and developing a plan to manage stress. ?Keep your skin well moisturized. ?Keep baths or showers shorter than 5 minutes and use warm water. Do not use hot water. ?This information is not intended to replace advice given to you by your health care provider. Make sure you discuss any questions you have with your health care provider. ?Document Revised: 07/13/2020 Document Reviewed: 07/13/2020 ?Elsevier Patient Education ? 2023 [employer] ? ?

## 2023-08-10 NOTE — Telephone Encounter (Signed)
Nothing else can do for rash- give a few days and see if steroids kick in to improve it

## 2023-08-11 ENCOUNTER — Other Ambulatory Visit: Payer: Self-pay

## 2023-08-11 DIAGNOSIS — Z981 Arthrodesis status: Secondary | ICD-10-CM | POA: Insufficient documentation

## 2023-08-14 ENCOUNTER — Ambulatory Visit: Payer: No Typology Code available for payment source | Admitting: Nurse Practitioner

## 2023-08-14 VITALS — BP 157/90 | HR 76 | Temp 97.9°F | Ht 68.0 in | Wt 207.8 lb

## 2023-08-14 DIAGNOSIS — L309 Dermatitis, unspecified: Secondary | ICD-10-CM

## 2023-08-14 DIAGNOSIS — I10 Essential (primary) hypertension: Secondary | ICD-10-CM

## 2023-08-14 DIAGNOSIS — L659 Nonscarring hair loss, unspecified: Secondary | ICD-10-CM | POA: Diagnosis not present

## 2023-08-14 MED ORDER — CETIRIZINE HCL 10 MG PO TABS: 10.0000 mg | ORAL_TABLET | Freq: Every day | ORAL | 1 refills | Status: DC

## 2023-08-14 MED ORDER — AMLODIPINE-OLMESARTAN 5-20 MG PO TABS: 1.0000 | ORAL_TABLET | Freq: Every day | ORAL | 1 refills | Status: DC

## 2023-08-14 NOTE — Progress Notes (Signed)
Established Patient Office Visit  Subjective   Patient ID: Brenda Cruz, female    DOB: [birth year]  Age: 57 y.o. MRN: [id number]  Chief Complaint  Patient presents with   Rash    Rash not getting any better, still itching, rash has spread some, thinks it is spreading to face    HPI Brenda Cruz is a 57 year old female here today concern for rash that is not improving.  She was first seen August 03, 2019 for further rash and was prescribed clobetasol cream and refill for 40 mg injection of methylprednison and was instructed to follow-up with PCP if symptoms not improved..  She saw her PCP on August 08, 2019 for for a rash that was not improving and was diagnosed with anxiety and started on Lexapro and also received 80 mg injection of methylprednisolone and was prescribed prednisone 20 mg twice daily for 5 days.  She reports that she never took the p.o. prednisone because after the injection she felt like her skin was coming out of her body.  He today because rash had spread all over her trunk area and her face.  Denied the use of new detergent, new bed linen, lotion or soap.  Reports that it is hard to sleep at night due to the pruritic nature of the rash.  She has been taking Benadryl as needed at night with minimal relief.  Since she is already under the care of dermatology she t advised her to follow-up with with them.  She is still concerned about Helen's cerumen or check her hormone levels  Hypertension controlled with current medication not well BP Readings from Last 3 Encounters:  08/14/23 (!) 157/90  08/08/23 (!) 175/94  08/03/23 (!) 159/76  She is currently on amlodipine 5 mg, Metroprolol 50 mg twice daily.  Reports recurrent headache but not the worst headache of her life.  She is agreeable to have amlodipine CBC and to starting her on Zoloft.  For creatinine, last lab has been abnormal so we discussed referral to advanced hypertensive team advanced hypertensive  clinic  Patient Active Problem List   Diagnosis Date Noted   Dermatitis 08/14/2023   History of lumbar fusion 08/11/2023   Hair loss 08/03/2023   Obstructive sleep apnea 06/21/2023   Ventral hernia without obstruction or gangrene 01/11/2023   Inflammatory disorder of digestive system 01/17/2022   Screen for colon cancer 11/24/2020   History of cancer of left breast 11/24/2020   History of external beam radiation therapy 11/24/2020   History of lumpectomy of left breast 11/24/2020   Tobacco abuse 07/02/2019   Cancer related pain 03/01/2018   Skin yeast infection 02/07/2018   Hypomagnesemia 01/29/2018   Hypokalemia 01/23/2018   Candida UTI 01/05/2018   Anxiety in cancer patient 12/27/2017   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) 12/15/2017   Type 2 diabetes mellitus (HCC) 12/15/2017   Family history of breast cancer in first degree relative 12/15/2017   Obesity with body mass index of 30.0-39.9 12/04/2017   Hyperlipidemia 03/15/2017   Stress incontinence of urine 12/07/2016   COPD (chronic obstructive pulmonary disease) (HCC) 12/07/2016   Hx of pulmonary embolus 12/07/2015   Iron deficiency anemia 11/23/2015   S/P CABG x 1 11/02/2015   Coronary artery disease involving native coronary artery of native heart with angina pectoris (HCC)    Essential hypertension 10/16/2015   GERD (gastroesophageal reflux disease) 05/16/2013   Depression 05/16/2013   GAD (generalized anxiety disorder) 05/16/2013   Past Medical  History:  Diagnosis Date   Anxiety    Asthma    COPD (chronic obstructive pulmonary disease) (HCC)    Coronary artery disease    DDD (degenerative disc disease)    GERD (gastroesophageal reflux disease)    Hemorrhoids    HIT (heparin-induced thrombocytopenia) (HCC)    Hyperlipemia    Hypertension    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) 12/15/2017   Iron deficiency anemia 11/23/2015   Myocardial infarction [facility]) 2019   widow maker   PONV  (postoperative nausea and vomiting)    Pulmonary emboli (HCC) 12/07/2015   [facility] spotted fever    TMJ (temporomandibular joint disorder)    Type 2 diabetes mellitus (HCC) 12/15/2017   Past Surgical History:  Procedure Laterality Date   CARDIAC CATHETERIZATION N/A 10/29/2015   Procedure: Left Heart Cath and Coronary Angiography;  Surgeon: Runell Gess, MD;  Location: [facility] INVASIVE CV LAB;  Service: Cardiovascular;  Laterality: N/A;   CHOLECYSTECTOMY     CORONARY ARTERY BYPASS GRAFT N/A 11/02/2015   Procedure: CORONARY ARTERY BYPASS GRAFTING (CABG)x 1 using left internal mammary artery.;  Surgeon: Loreli Slot, MD;  Location: [facility] OR;  Service: Open Heart Surgery;  Laterality: N/A;   LEFT OOPHORECTOMY Left    Related to tubal torsion.  Bilateral salpingectomy also.  Remaining uterus and right ovary   LUMBAR FUSION     TEE WITHOUT CARDIOVERSION N/A 11/02/2015   Procedure: TRANSESOPHAGEAL ECHOCARDIOGRAM (TEE);  Surgeon: Loreli Slot, MD;  Location: [facility] OR;  Service: Open Heart Surgery;  Laterality: N/A;   XI ROBOTIC ASSISTED VENTRAL HERNIA N/A 01/11/2023   Procedure: XI ROBOTIC ASSISTED VENTRAL HERNIA REPAIR W/ MESH- SUPRAUMBILICAL;  Surgeon: Lewie Chamber, DO;  Location: [facility] ORS;  Service: General;  Laterality: N/A;   Social History   Tobacco Use   Smoking status: Every Day    Current packs/day: 1.00    Types: Cigarettes   Smokeless tobacco: Never   Tobacco comments:    Patient has been smoking 38years//PAP 8/12  Vaping Use   Vaping status: Never Used  Substance Use Topics   Alcohol use: No    Alcohol/week: 0.0 standard drinks of alcohol   Drug use: No   Social History   Socioeconomic History   Marital status: Married    Spouse name: Not on file   Number of children: 1   Years of education: Not on file   Highest education level: Some college, no degree  Occupational History   Occupation: Designer, jewellery  Tobacco Use   Smoking status: Every Day     Current packs/day: 1.00    Types: Cigarettes   Smokeless tobacco: Never   Tobacco comments:    Patient has been smoking 38years//PAP 8/12  Vaping Use   Vaping status: Never Used  Substance and Sexual Activity   Alcohol use: No    Alcohol/week: 0.0 standard drinks of alcohol   Drug use: No   Sexual activity: Not on file  Other Topics Concern   Not on file  Social History Narrative   2 caffeine drinks daily    Social Determinants of Health   Financial Resource Strain: Low Risk  (08/14/2023)   Overall Financial Resource Strain (CARDIA)    Difficulty of Paying Living Expenses: Not hard at all  Food Insecurity: No Food Insecurity (08/14/2023)   Hunger Vital Sign    Worried About Running Out of Food in the Last Year: Never true    [employer] of Food  in the Last Year: Never true  Transportation Needs: No Transportation Needs (08/14/2023)   PRAPARE - Administrator, Civil Service (Medical): No    Lack of Transportation (Non-Medical): No  Physical Activity: Insufficiently Active (08/14/2023)   Exercise Vital Sign    Days of Exercise per Week: 1 day    Minutes of Exercise per Session: 30 min  Stress: Stress Concern Present (08/14/2023)   [employer] of Occupational Health - Occupational Stress Questionnaire    Feeling of Stress : Rather much  Social Connections: Moderately Integrated (08/14/2023)   Social Connection and Isolation Panel [NHANES]    Frequency of Communication with Friends and Family: Three times a week    Frequency of Social Gatherings with Friends and Family: Once a week    Attends Religious Services: 1 to 4 times per year    Active Member of [employer] or Organizations: No    Attends Engineer, structural: Not on file    Marital Status: Married  Catering manager Violence: Not on file   Family Status  Relation Name Status   Father  Alive   PGM  Deceased   MGF  Deceased   Mother  Alive   Other  (Not Specified)   MGM  Deceased   PGF   Deceased   Neg Hx  (Not Specified)  No partnership data on file   Family History  Problem Relation Age of Onset   Colon polyps Father    Stomach cancer Paternal Grandmother    Esophageal cancer Maternal Grandfather    Breast cancer Mother    Ovarian cancer Other        Maternal Side Great  Aunt   Colon cancer Neg Hx    Allergies  Allergen Reactions   Atorvastatin Other (See Comments)    Myalgia    Iohexol Itching, Rash and Other (See Comments)    Desc: White blisters in mouth during ivp in [place] '93, ok w/ 13 hour prep today//a.calhoun, Onset Date: 10/30/1991    Ace Inhibitors Cough   Farxiga [Dapagliflozin]     UTI, heart palpitations   Heparin Other (See Comments)    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   Sulfa Antibiotics Itching, Rash and Other (See Comments)      Review of Systems  Constitutional:  Negative for chills and fever.  HENT:  Negative for congestion and sore throat.   Eyes:  Negative for pain.  Respiratory:  Negative for cough and shortness of breath.   Cardiovascular:  Negative for chest pain and leg swelling.  Gastrointestinal:  Negative for blood in stool, constipation, nausea and vomiting.  Musculoskeletal:  Negative for joint pain and myalgias.  Skin:  Positive for itching and rash.  Neurological:  Positive for headaches. Negative for dizziness.  Psychiatric/Behavioral:  Negative for suicidal ideas. The patient does not have  insomnia.    Negative unless indicated in HPI   Objective:     BP (!) 157/90  Pulse 76   Temp 97.9 F (36.6 C) (Temporal)   Ht 5\' 8"  (1.727 m)   Wt 207 lb 12.8 oz (94.3 kg)   LMP 06/03/2017 (Exact Date)   SpO2 100%   BMI 31.60 kg/m  BP Readings from Last 3 Encounters:  08/14/23 (!) 157/90  08/08/23 (!) 175/94  08/03/23 (!) 159/76   Wt Readings from Last 3 Encounters:  08/14/23 207 lb 12.8 oz (94.3 kg)  08/08/23 209 lb (94.8 kg)  08/03/23 209 lb 6.4 oz (95 kg)      Physical Exam Vitals and nursing note reviewed.  Constitutional:      General: She is not in acute distress.    Appearance: Normal appearance.  HENT:     Head: Normocephalic and atraumatic.  Eyes:     General: No scleral icterus.    Extraocular Movements: Extraocular movements intact.     Conjunctiva/sclera: Conjunctivae normal.     Pupils: Pupils are equal, round, and reactive to light.  Cardiovascular:     Rate and Rhythm: Normal rate and regular rhythm.  Pulmonary:     Effort: Pulmonary effort is normal.     Breath sounds: Normal breath sounds.  Musculoskeletal:        General: Normal range of motion.     Cervical back: Normal range of motion and neck supple.     Right lower leg: No edema.     Left lower leg: No edema.  Skin:    General: Skin is warm and dry.  Neurological:     Mental Status: She is alert and oriented to person, place, and time. Mental status is at baseline.  Psychiatric:        Mood and Affect: Mood normal.        Behavior: Behavior normal.        Thought Content: Thought content normal.        Judgment: Judgment normal.    No results found for any visits on 08/14/23.  Last CBC Lab Results  Component Value Date   WBC 10.9 (H) 07/04/2023   HGB 13.5 07/04/2023   HCT 41.5 07/04/2023   MCV 93 07/04/2023   MCH 30.1 07/04/2023   RDW 12.2 07/04/2023   PLT 272 07/04/2023   Last metabolic panel Lab Results  Component Value Date   GLUCOSE 172 (H) 07/04/2023   NA 139  07/04/2023   K 4.5 07/04/2023   CL 98 07/04/2023   CO2 21 07/04/2023   BUN 9 07/04/2023   CREATININE 1.27 (H) 07/04/2023   EGFR 50 (L) 07/04/2023   CALCIUM 9.4 07/04/2023   PHOS 4.6 (H) 07/04/2023   PROT 7.3 07/04/2023   ALBUMIN 4.5 07/04/2023   LABGLOB 2.8 07/04/2023   AGRATIO 1.6 11/29/2022   BILITOT 0.3 07/04/2023   ALKPHOS 97 07/04/2023   AST 63 (H) 07/04/2023   ALT 47 (H) 07/04/2023   ANIONGAP 12 03/09/2023   Last lipids Lab Results  Component Value Date   CHOL 294 (H) 11/29/2022   HDL 37 (L) 11/29/2022   LDLCALC 179 (H) 11/29/2022   TRIG 394 (H) 11/29/2022   CHOLHDL 7.9 (H) 11/29/2022   Last hemoglobin A1c Lab Results  Component Value Date   HGBA1C 9.0 (H) 11/29/2022   Last thyroid functions Lab Results  Component Value Date   TSH 1.380 08/03/2023   T4TOTAL 7.9 08/03/2023   Last vitamin D Lab Results  Component Value Date   VD25OH 28.0 (L) 07/04/2023  Assessment & Plan:  Essential hypertension -     amLODIPine-Olmesartan; Take 1 tablet by mouth daily.  Dispense: 30 tablet; Refill: 1 -     Ambulatory referral to [facility]  Dermatitis -     Cetirizine HCl; Take 1 tablet (10 mg total) by mouth daily.  Dispense: 30 tablet; Refill: 1  Hair loss -     Hormone Panel  HTN: refer to Advanced Hypertensive clinic; D/c Amlodipine, amLODIPine-Olmesartan;   Dermatitis -     Cetirizine HCl; Take 1 tablet (10 mg total) by mouth daily.  Dispense: 30 tablet; Refill: 1  - make an appointment with dermatology fro further care Hair loss -     Hormone Panel  Encourage healthy lifestyle choices, including diet (rich in fruits, vegetables, and lean proteins, and low in salt and simple carbohydrates) and exercise (at least 30 minutes of moderate physical activity daily).     The above assessment and management plan was discussed with the patient. The patient verbalized understanding of and has agreed to the management plan. Patient is aware  to call the clinic if they develop any new symptoms or if symptoms persist or worsen. Patient is aware when to return to the clinic for a follow-up visit. Patient educated on when it is appropriate to go to the emergency department.  Return if symptoms worsen or fail to improve.    Arrie Aran Santa Lighter, DNP [facility] [address] [place], [place] [address] [phone]

## 2023-08-16 NOTE — Progress Notes (Unsigned)
08/17/2023 DOAA TOWE [id number] 12-06-1965  Referring provider: Bennie Pierini, * Primary GI doctor: Dr. Marina Goodell  ASSESSMENT AND PLAN:   Chronic Diarrhea since at least 2012 or prior Daily watery, yellow stools for the past 5 years, up to 5 times daily. Associated with multiple mineral deficiencies. Possible bile acid diarrhea due to history of cholecystectomy. -Order endoscopy and colonoscopy to further evaluate with Dr. Marina Goodell at [facility] I recommend upper gastrointestinal and colorectal evaluation with an EGD and colonoscopy.  Risk of bowel prep, conscious sedation, and EGD and colonoscopy were discussed.  Risks include but are not limited to dehydration, pain, bleeding, cardiopulmonary process, bowel perforation, or other possible adverse outcomes..  Treatment plan was discussed with patient, and agreed upon. -Start Colestipol, begin with half a pill once daily and adjust as needed. -Please check with primary care if metformin is immediate release or extended release, make sure its extended release -Given information about magnesium and decreasing diarrhea with magnesium  Gastroesophageal Reflux Disease (GERD) with nausea and atypical chest pain  atypical chest pain after eating no shortness of breath or diaphoresis or any issues with exertion. On Nexium daily, continue add on Pepcid Stop smoking Diet given Schedule for endoscopy with Dr. Marina Goodell at [facility]  Hypertension Currently on Amlodipine and Lopressor, with plans to start a combination medication due to elevated blood pressure. -Continue current medications and follow up with primary care provider regarding new medication.  Ventral Hernia Recurrent hernia with associated pain and history of previous hernia repairs. -Consider surgical repair if symptoms persist or worsen. Given information about hernias  Discussed ER precautions Weight loss advised.   General Health Maintenance -Continue Metformin 1000mg  twice  daily for diabetes management. -Continue Zofran as needed for nausea. -Continue Oxycodone as needed for back pain. -Advise cessation of smoking for overall health improvement. -Order labs for hormone levels and liver function.         Patient Care Team: Bennie Pierini, [name] as PCP - General (Family Medicine) Runell Gess, MD as PCP - Cardiology (Cardiology) Barrett, Joline Salt, PA-C as Physician Assistant (Cardiology) Delora Fuel, OD (Optometry)  HISTORY OF PRESENT ILLNESS: 57 y.o. female with a past medical history of degenerative disc disease, obesity, anxiety, asthma, GERD, COPD, status post cholecystectomy, breast cancer s/p treatment and others listed below presents for evaluation of chronic diarrhea.   03/07/2011 MRI abdomen with without contrast unremarkable 07/12/2011 office visit with Dr. Marina Goodell for chronic abdominal discomfort.  Negative TTG.  Patient was set up for colonoscopy and endoscopy of this was not done.  11/27/2022 CT abdomen pelvis with without contrast for abdominal wall hernia suspected generalized abdominal pain showed small fat-containing midline ventral abdominal wall hernia just.  To the umbilicus otherwise unremarkable. 07/05/2023 abdominal ultrasound for abdominal pain hepatic steatosis with spleen upper limits of normal, status post cholecystectomy, normal bile ducts 07/05/2023 labs reviewed showed LFTs elevated AST 63, ALT 47 normal alk phos and total bilirubin, BUN 9, creatinine elevated 1.27, elevated glucose, negative lipase, WBC slightly elevated at 10.9 no anemia.  Discussed the use of AI scribe software for clinical note transcription with the patient, who gave verbal consent to proceed.  The patient, with a history of breast cancer, hypertension, and a recent hernia repair, presents with chronic diarrhea since at least 2012, worse last five years ago. The diarrhea is described as a daily occurrence, often watery and yellow in color, and is  associated with urgency. The patient reports that the diarrhea often occurs  after eating and has led to episodes of incontinence. The patient also reports a history of magnesium and vitamin D deficiency, requiring high-dose supplementation. She is currently on metformin 1000mg  twice daily, which she suspects may be contributing to her symptoms. In addition to the diarrhea, the patient has been experiencing abdominal pain related to a hernia that was not repaired during a recent surgery. The hernia is described as painful and occasionally requires manual reduction.  The patient also reports occasional chest pressure, sometimes after eating, but denies associated shortness of breath or need for nitroglycerin. She has a history of open-heart surgery and is currently on amlodipine for hypertension, with plans to start a combination therapy due to persistently high blood pressure.  The patient also mentions experiencing reflux, managed with daily Nexium, and occasional difficulty swallowing. She reports nausea, managed with Zofran, and is on oxycodone for chronic back pain. The patient has noticed significant hair loss over the past month and has a family history of autoimmune conditions. She is a current smoker. Despite these health issues, the patient denies any significant weight loss.    She  reports that she has been smoking cigarettes. She has never used smokeless tobacco. She reports that she does not drink alcohol and does not use drugs.  RELEVANT LABS AND IMAGING:  Results   LABS Magnesium: 1.5-1.6 mg/dL Vitamin D: deficient Liver function tests: elevated  RADIOLOGY CT abdomen: three hernias, fatty liver (11/2022)      CBC    Component Value Date/Time   WBC 10.9 (H) 07/04/2023 1618   WBC 6.3 03/09/2023 2113   RBC 4.48 07/04/2023 1618   RBC 4.11 03/09/2023 2113   HGB 13.5 07/04/2023 1618   HGB 12.4 12/07/2015 1029   HCT 41.5 07/04/2023 1618   HCT 38.2 12/07/2015 1029   PLT 272  07/04/2023 1618   MCV 93 07/04/2023 1618   MCV 87.2 12/07/2015 1029   MCH 30.1 07/04/2023 1618   MCH 30.2 03/09/2023 2113   MCHC 32.5 07/04/2023 1618   MCHC 33.2 03/09/2023 2113   RDW 12.2 07/04/2023 1618   RDW 14.8 (H) 12/07/2015 1029   LYMPHSABS 3.1 07/04/2023 1618   LYMPHSABS 2.4 12/07/2015 1029   MONOABS 0.7 03/09/2023 2113   MONOABS 0.9 12/07/2015 1029   EOSABS 0.2 07/04/2023 1618   BASOSABS 0.1 07/04/2023 1618   BASOSABS 0.1 12/07/2015 1029   Recent Labs    08/29/22 1038 11/27/22 1553 11/29/22 0918 03/09/23 2113 07/04/23 1618  HGB 13.6 12.9 13.6 12.4 13.5    CMP     Component Value Date/Time   NA 139 07/04/2023 1618   K 4.5 07/04/2023 1618   CL 98 07/04/2023 1618   CO2 21 07/04/2023 1618   GLUCOSE 172 (H) 07/04/2023 1618   GLUCOSE 176 (H) 03/09/2023 2113   BUN 9 07/04/2023 1618   CREATININE 1.27 (H) 07/04/2023 1618   CALCIUM 9.4 07/04/2023 1618   PROT 7.3 07/04/2023 1618   ALBUMIN 4.5 07/04/2023 1618   AST 63 (H) 07/04/2023 1618   ALT 47 (H) 07/04/2023 1618   ALKPHOS 97 07/04/2023 1618   BILITOT 0.3 07/04/2023 1618   GFRNONAA 51 (L) 03/09/2023 2113   GFRAA 67 04/01/2020 0932      Latest Ref Rng & Units 07/04/2023    4:18 PM 11/29/2022    9:18 AM 11/27/2022    3:53 PM  Hepatic Function  Total Protein 6.0 - 8.5 g/dL 7.3  7.0  6.7   Albumin 3.8 - 4.9 g/dL  4.5  4.3  3.4   AST 0 - 40 IU/L 63  48  36   ALT 0 - 32 IU/L 47  46  31   Alk Phosphatase 44 - 121 IU/L 97  111  72   Total Bilirubin 0.0 - 1.2 mg/dL 0.3  0.3  0.5       Current Medications:   Current Outpatient Medications (Endocrine & Metabolic):    metFORMIN (GLUCOPHAGE) 1000 MG tablet, Take 1 tablet (1,000 mg total) by mouth 2 (two) times daily.  Current Outpatient Medications (Cardiovascular):    amLODipine-olmesartan (AZOR) 5-20 MG tablet, Take 1 tablet by mouth daily.   colestipol (COLESTID) 1 g tablet, Take 2 tablets (2 g total) by mouth 2 (two) times daily.   Evolocumab (REPATHA  SURECLICK) 140 MG/ML SOAJ, Inject 140 mg into the skin every 14 (fourteen) days.   metoprolol tartrate (LOPRESSOR) 50 MG tablet, Take 1 tablet (50 mg total) by mouth 2 (two) times daily.   nitroGLYCERIN (NITROSTAT) 0.4 MG SL tablet, Place 1 tablet (0.4 mg total) under the tongue every 5 (five) minutes as needed for chest pain.  Current Outpatient Medications (Respiratory):    albuterol (VENTOLIN HFA) 108 (90 Base) MCG/ACT inhaler, Inhale 2 puffs into the lungs every 4 (four) hours as needed for wheezing or shortness of breath.   Budeson-Glycopyrrol-Formoterol (BREZTRI AEROSPHERE) 160-9-4.8 MCG/ACT AERO, Inhale into the lungs.   cetirizine (ZYRTEC ALLERGY) 10 MG tablet, Take 1 tablet (10 mg total) by mouth daily.   diphenhydrAMINE (BENADRYL) 25 MG tablet, Take 25 mg by mouth every 6 (six) hours as needed for itching or allergies.   fluticasone (FLONASE) 50 MCG/ACT nasal spray, Place 2 sprays into both nostrils daily. (Patient taking differently: Place 2 sprays into both nostrils daily as needed for allergies.)   ipratropium-albuterol (DUONEB) 0.5-2.5 (3) MG/3ML SOLN, Take 3 mLs by nebulization every 6 (six) hours as needed (Foir shortness of breath).  Current Outpatient Medications (Analgesics):    aspirin EC 81 MG tablet, Take 1 tablet (81 mg total) by mouth every evening.   ibuprofen (ADVIL) 200 MG tablet, Take 400 mg by mouth every 6 (six) hours as needed for moderate pain.   oxyCODONE (ROXICODONE) 5 MG immediate release tablet, Take 1 tablet (5 mg total) by mouth every 6 (six) hours as needed.   Current Outpatient Medications (Other):    blood glucose meter kit and supplies, Dispense based on patient and insurance preference. Use up to four times daily as directed. (FOR ICD-10 E10.9, E11.9).   Calcium Carb-Cholecalciferol (CALCIUM 600 + D PO), Take 1 tablet by mouth daily.   clobetasol cream (TEMOVATE) 0.05 %, Apply 1 Application topically 2 (two) times daily.   cyclobenzaprine (FLEXERIL) 10  MG tablet, TAKE ONE TABLET BY MOUTH THREE TIMES DAILY AS NEEDED FOR MUSCLE SPASM   escitalopram (LEXAPRO) 10 MG tablet, Take 1 tablet (10 mg total) by mouth daily.   esomeprazole (NEXIUM) 40 MG capsule, Take 1 capsule (40 mg total) by mouth daily.   famotidine (PEPCID) 40 MG tablet, Take 1 tablet (40 mg total) by mouth at bedtime.   LORazepam (ATIVAN) 0.5 MG tablet, Take 1 tablet (0.5 mg total) by mouth every 8 (eight) hours as needed for anxiety.   magnesium oxide (MAG-OX) 400 MG tablet, Take 1 tablet (400 mg total) by mouth daily. (Patient taking differently: Take 800 mg by mouth 2 (two) times daily.)   MELATONIN PO, Take 1 tablet by mouth at bedtime as needed (sleep).  ondansetron (ZOFRAN-ODT) 8 MG disintegrating tablet, Take 1 tablet (8 mg total) by mouth every 6 (six) hours as needed for nausea or vomiting.   Vitamin D, Ergocalciferol, (DRISDOL) 1.25 MG (50000 UNIT) CAPS capsule, Take 1 capsule (50,000 Units total) by mouth every 7 (seven) days.  Medical History:  Past Medical History:  Diagnosis Date   Anxiety    Asthma    COPD (chronic obstructive pulmonary disease) (HCC)    Coronary artery disease    DDD (degenerative disc disease)    GERD (gastroesophageal reflux disease)    Hemorrhoids    HIT (heparin-induced thrombocytopenia) (HCC)    Hyperlipemia    Hypertension    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) 12/15/2017   Iron deficiency anemia 11/23/2015   Myocardial infarction [facility]) 2019   widow maker   PONV (postoperative nausea and vomiting)    Pulmonary emboli (HCC) 12/07/2015   [facility] spotted fever    TMJ (temporomandibular joint disorder)    Type 2 diabetes mellitus (HCC) 12/15/2017   Allergies:  Allergies  Allergen Reactions   Atorvastatin Other (See Comments)    Myalgia    Iohexol Itching, Rash and Other (See Comments)    Desc: White blisters in mouth during ivp in [place] '93, ok w/ 13 hour prep today//a.calhoun, Onset Date:  10/30/1991    Ace Inhibitors Cough   Farxiga [Dapagliflozin]     UTI, heart palpitations   Heparin Other (See Comments)    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   Sulfa Antibiotics Itching, Rash and Other (See Comments)     Surgical History:  She  has a past surgical history that includes Cholecystectomy; Lumbar fusion; Cardiac catheterization (N/A, 10/29/2015); Coronary artery bypass graft (N/A, 11/02/2015); TEE without cardioversion (N/A, 11/02/2015); Left oophorectomy (Left); and XI robotic assisted ventral hernia (N/A, 01/11/2023). Family History:  Her family history includes Breast cancer in her mother; Colon polyps in her father; Esophageal cancer in her maternal grandfather; Ovarian cancer in an other family member; Stomach cancer in her paternal grandmother.  REVIEW OF SYSTEMS  : All other systems reviewed and negative except where noted in the History of Present Illness.  PHYSICAL EXAM: BP (!) 160/88   Pulse 75   Wt 211 lb (95.7 kg)   LMP 06/03/2017 (Exact Date)   BMI 32.08 kg/m  General Appearance: Well nourished, in no apparent distress. Head:   Normocephalic and atraumatic. Eyes:  sclerae anicteric,conjunctive pink  Respiratory: Respiratory effort normal, BS equal bilaterally without rales, rhonchi, wheezing. Cardio: RRR with no MRGs. Peripheral pulses intact.  Abdomen: Soft,  Obese ,active bowel sounds. moderate tenderness in the epigastrium. Without guarding  and Without rebound. No masses. Rectal: Not evaluated Musculoskeletal: Full ROM, Normal gait. Without edema. Skin:  Dry and intact without significant lesions or rashes Neuro: Alert and  oriented x4;  No focal deficits. Psych:  Cooperative. Normal mood and affect.    Doree Albee, PA-C 3:01 PM

## 2023-08-17 ENCOUNTER — Ambulatory Visit: Payer: No Typology Code available for payment source | Admitting: Physician Assistant

## 2023-08-17 ENCOUNTER — Other Ambulatory Visit: Payer: No Typology Code available for payment source

## 2023-08-17 ENCOUNTER — Encounter: Payer: Self-pay | Admitting: Physician Assistant

## 2023-08-17 VITALS — BP 160/88 | HR 75 | Wt 211.0 lb

## 2023-08-17 DIAGNOSIS — K529 Noninfective gastroenteritis and colitis, unspecified: Secondary | ICD-10-CM | POA: Diagnosis not present

## 2023-08-17 DIAGNOSIS — K760 Fatty (change of) liver, not elsewhere classified: Secondary | ICD-10-CM

## 2023-08-17 DIAGNOSIS — R0789 Other chest pain: Secondary | ICD-10-CM

## 2023-08-17 LAB — CBC WITH DIFFERENTIAL/PLATELET
Basophils Absolute: 0.1 10*3/uL (ref 0.0–0.1)
Basophils Relative: 0.7 % (ref 0.0–3.0)
Eosinophils Absolute: 0.4 10*3/uL (ref 0.0–0.7)
Eosinophils Relative: 3.5 % (ref 0.0–5.0)
HCT: 38.8 % (ref 36.0–46.0)
Hemoglobin: 12.7 g/dL (ref 12.0–15.0)
Lymphocytes Relative: 26 % (ref 12.0–46.0)
Lymphs Abs: 2.7 10*3/uL (ref 0.7–4.0)
MCHC: 32.7 g/dL (ref 30.0–36.0)
MCV: 91 fL (ref 78.0–100.0)
Monocytes Absolute: 0.9 10*3/uL (ref 0.1–1.0)
Monocytes Relative: 8.4 % (ref 3.0–12.0)
Neutro Abs: 6.3 10*3/uL (ref 1.4–7.7)
Neutrophils Relative %: 61.4 % (ref 43.0–77.0)
Platelets: 241 10*3/uL (ref 150.0–400.0)
RBC: 4.27 Mil/uL (ref 3.87–5.11)
RDW: 13.6 % (ref 11.5–15.5)
WBC: 10.3 10*3/uL (ref 4.0–10.5)

## 2023-08-17 LAB — COMPREHENSIVE METABOLIC PANEL
ALT: 33 U/L (ref 0–35)
AST: 37 U/L (ref 0–37)
Albumin: 4 g/dL (ref 3.5–5.2)
Alkaline Phosphatase: 81 U/L (ref 39–117)
BUN: 14 mg/dL (ref 6–23)
CO2: 30 meq/L (ref 19–32)
Calcium: 9 mg/dL (ref 8.4–10.5)
Chloride: 99 meq/L (ref 96–112)
Creatinine, Ser: 1.27 mg/dL — ABNORMAL HIGH (ref 0.40–1.20)
GFR: 47.83 mL/min — ABNORMAL LOW (ref >60.00)
Glucose, Bld: 227 mg/dL — ABNORMAL HIGH (ref 70–99)
Potassium: 4.1 meq/L (ref 3.5–5.1)
Sodium: 136 meq/L (ref 135–145)
Total Bilirubin: 0.2 mg/dL (ref 0.2–1.2)
Total Protein: 7.3 g/dL (ref 6.0–8.3)

## 2023-08-17 LAB — IBC + FERRITIN
Ferritin: 76.3 ng/mL (ref 10.0–291.0)
Iron: 48 ug/dL (ref 42–145)
Saturation Ratios: 12.9 % — ABNORMAL LOW (ref 20.0–50.0)
TIBC: 371 ug/dL (ref 250.0–450.0)
Transferrin: 265 mg/dL (ref 212.0–360.0)

## 2023-08-17 LAB — SEDIMENTATION RATE: Sed Rate: 55 mm/h — ABNORMAL HIGH (ref 0–30)

## 2023-08-17 LAB — TSH: TSH: 1.69 u[IU]/mL (ref 0.35–5.50)

## 2023-08-17 MED ORDER — NA SULFATE-K SULFATE-MG SULF 17.5-3.13-1.6 GM/177ML PO SOLN: 1.0000 | Freq: Once | ORAL | 0 refills | Status: AC

## 2023-08-17 MED ORDER — COLESTIPOL HCL 1 G PO TABS: 2.0000 g | ORAL_TABLET | Freq: Two times a day (BID) | ORAL | 0 refills | Status: DC

## 2023-08-17 MED ORDER — FAMOTIDINE 40 MG PO TABS: 40.0000 mg | ORAL_TABLET | Freq: Every day | ORAL | 0 refills | Status: DC

## 2023-08-17 NOTE — Progress Notes (Signed)
Assessment and plans noted ?

## 2023-08-17 NOTE — Patient Instructions (Addendum)
Your provider has requested that you go to the basement level for lab work before leaving today. Press "B" on the elevator. The lab is located at the first door on the left as you exit the elevator.  You have been scheduled for a colonoscopy/egd. Please follow written instructions given to you at your visit today.   Please pick up your prep supplies at the pharmacy within the next 1-3 days.  If you use inhalers (even only as needed), please bring them with you on the day of your procedure.  DO NOT TAKE 7 DAYS PRIOR TO TEST- Trulicity (dulaglutide) Ozempic, Wegovy (semaglutide) Mounjaro (tirzepatide) Bydureon Bcise (exanatide extended release)  DO NOT TAKE 1 DAY PRIOR TO YOUR TEST Rybelsus (semaglutide) Adlyxin (lixisenatide) Victoza (liraglutide) Byetta (exanatide) ___________________________________________________________________________   Please take your proton pump inhibitor medication, pepcid 40 mg  Please take this medication 30 minutes to 1 hour before meals- this makes it more effective.  Avoid spicy and acidic foods Avoid fatty foods Limit your intake of coffee, tea, alcohol, and carbonated drinks Work to maintain a healthy weight Keep the head of the bed elevated at least 3 inches with blocks or a wedge pillow if you are having any nighttime symptoms Stay upright for 2 hours after eating Avoid meals and snacks three to four hours before bedtime Stop smoking  Ways to prevent diarrhea with magnesium:  1) Don't take all your magnesium at the same time, have 2-3 smaller doses through out the day 2) Try taking your magnesium with high fiber meals.  3) If this does not help, take the magnesium on an empty stomach. Fiber for some people can bind the magnesium too well and prevent absorption in your gut.  4) Lastly try different types of magnesium. Most people are taking magnesium citrate, you can also try dimalate capsules which are slow release. You can also find magnesium  lotions/sprays for the skin that bypass the gut. Another one that has good absorption is ReMag (pico-iconic magnesium formula), this has great cellular absorption so less of a laxative effect. You can find these type at health food stores or online.   Check on metformin if extended release or not  Metabolic dysfunction associated seatohepatitis  Now the leading cause of liver failure in the [place].  It is normally from such risk factors as obesity, diabetes, insulin resistance, high cholesterol, or metabolic syndrome.  The only definitive therapy is weight loss and exercise.   Suggest walking 20-30 mins daily.  Decreasing carbohydrates, increasing veggies.    Fatty Liver Fatty liver is the accumulation of fat in liver cells. It is also called hepatosteatosis or steatohepatitis. It is normal for your liver to contain some fat. If fat is more than 5 to 10% of your liver's weight, you have fatty liver.  There are often no symptoms (problems) for years while damage is still occurring. People often learn about their fatty liver when they have medical tests for other reasons. Fat can damage your liver for years or even decades without causing problems. When it becomes severe, it can cause fatigue, weight loss, weakness, and confusion. This makes you more likely to develop more serious liver problems. The liver is the largest organ in the body. It does a lot of work and often gives no warning signs when it is sick until late in a disease. The liver has many important jobs including: Breaking down foods. Storing vitamins, iron, and other minerals. Making proteins. Making bile for food digestion.  Breaking down many products including medications, alcohol and some poisons.  PROGNOSIS  Fatty liver may cause no damage or it can lead to an inflammation of the liver. This is, called steatohepatitis.  Over time the liver may become scarred and hardened. This condition is called cirrhosis. Cirrhosis  is serious and may lead to liver failure or cancer. NASH is one of the leading causes of cirrhosis. About 10-20% of Americans have fatty liver and a smaller 2-5% has NASH.  TREATMENT  Weight loss, fat restriction, and exercise in overweight patients produces inconsistent results but is worth trying. Good control of diabetes may reduce fatty liver. Eat a balanced, healthy diet. Increase your physical activity. There are no medical or surgical treatments for a fatty liver or NASH, but improving your diet and increasing your exercise may help prevent or reverse some of the damage.  _______________________________________________________  If your blood pressure at your visit was 140/90 or greater, please contact your primary care physician to follow up on this.  _______________________________________________________  If you are age 55 or older, your body mass index should be between 23-30. Your Body mass index is 32.08 kg/m. If this is out of the aforementioned range listed, please consider follow up with your Primary Care Provider.  If you are age 55 or younger, your body mass index should be between 19-25. Your Body mass index is 32.08 kg/m. If this is out of the aformentioned range listed, please consider follow up with your Primary Care Provider.   ________________________________________________________  The [facility] providers would like to encourage you to use [facility] to communicate with providers for non-urgent requests or questions.  Due to long hold times on the telephone, sending your provider a message by [facility] may be a faster and more efficient way to get a response.  Please allow 48 business hours for a response.  Please remember that this is for non-urgent requests.  _______________________________________________________

## 2023-08-19 ENCOUNTER — Emergency Department (HOSPITAL_COMMUNITY)
Admission: EM | Admit: 2023-08-19 | Discharge: 2023-08-19 | Disposition: A | Discharge status: Home / Self Care | Payer: No Typology Code available for payment source | Attending: Emergency Medicine | Admitting: Emergency Medicine

## 2023-08-19 ENCOUNTER — Other Ambulatory Visit: Payer: Self-pay

## 2023-08-19 DIAGNOSIS — I2510 Atherosclerotic heart disease of native coronary artery without angina pectoris: Secondary | ICD-10-CM | POA: Diagnosis not present

## 2023-08-19 DIAGNOSIS — Z79899 Other long term (current) drug therapy: Secondary | ICD-10-CM | POA: Diagnosis not present

## 2023-08-19 DIAGNOSIS — R21 Rash and other nonspecific skin eruption: Secondary | ICD-10-CM | POA: Diagnosis present

## 2023-08-19 DIAGNOSIS — Z7982 Long term (current) use of aspirin: Secondary | ICD-10-CM | POA: Diagnosis not present

## 2023-08-19 DIAGNOSIS — Z7952 Long term (current) use of systemic steroids: Secondary | ICD-10-CM | POA: Insufficient documentation

## 2023-08-19 DIAGNOSIS — Z951 Presence of aortocoronary bypass graft: Secondary | ICD-10-CM | POA: Insufficient documentation

## 2023-08-19 DIAGNOSIS — Z7951 Long term (current) use of inhaled steroids: Secondary | ICD-10-CM | POA: Insufficient documentation

## 2023-08-19 DIAGNOSIS — E119 Type 2 diabetes mellitus without complications: Secondary | ICD-10-CM | POA: Diagnosis not present

## 2023-08-19 DIAGNOSIS — Z853 Personal history of malignant neoplasm of breast: Secondary | ICD-10-CM | POA: Insufficient documentation

## 2023-08-19 DIAGNOSIS — F17210 Nicotine dependence, cigarettes, uncomplicated: Secondary | ICD-10-CM | POA: Insufficient documentation

## 2023-08-19 DIAGNOSIS — J449 Chronic obstructive pulmonary disease, unspecified: Secondary | ICD-10-CM | POA: Insufficient documentation

## 2023-08-19 DIAGNOSIS — Z7984 Long term (current) use of oral hypoglycemic drugs: Secondary | ICD-10-CM | POA: Insufficient documentation

## 2023-08-19 DIAGNOSIS — L509 Urticaria, unspecified: Secondary | ICD-10-CM | POA: Diagnosis not present

## 2023-08-19 DIAGNOSIS — I10 Essential (primary) hypertension: Secondary | ICD-10-CM | POA: Insufficient documentation

## 2023-08-19 MED ORDER — HYDROXYZINE HCL 25 MG PO TABS: 25.0000 mg | ORAL_TABLET | ORAL | 0 refills | Status: DC | PRN

## 2023-08-19 MED ORDER — PREDNISONE 10 MG (21) PO TBPK: ORAL_TABLET | Freq: Every day | ORAL | 0 refills | Status: DC

## 2023-08-19 MED ORDER — DEXAMETHASONE SODIUM PHOSPHATE 10 MG/ML IJ SOLN
10.0000 mg | Freq: Once | INTRAMUSCULAR | Status: AC
  Administered 2023-08-19: 10 mg via INTRAMUSCULAR
  Filled 2023-08-19: qty 1

## 2023-08-19 NOTE — Telephone Encounter (Signed)
Recommend she start CPAP therapy for Severe obstruction sleep apnea. She stopped breathing or is under breathing 41 times per hour on average.  Please order patient for auto-titrating CPAP 5-15cmH2O with humidification and face mask fitting session.   She will need compliance review 1 month after starting her CPAP therapy.  Thanks, Dr. Francine Graven

## 2023-08-19 NOTE — ED Provider Notes (Signed)
[place] EMERGENCY DEPARTMENT AT [facility] Provider Note  CSN: [id number] Arrival date & time: 08/19/23 4098  Chief Complaint(s) Rash  HPI Brenda Cruz is a 57 y.o. female history of COPD, coronary artery disease, hypertension, lipidemia presenting to the emergency department with hives.  She reports that last night she noticed that she had hives.  She took some Benadryl and Pepcid last night, did not help a whole lot.  Denies any new exposures like pets, soaps or detergents, clothing, foods.  Only new medication is Lexapro which she started a few days earlier.  She had some swelling to her lips but no tongue swelling, difficulty swallowing or breathing, wheezing, diarrhea, nausea.  No syncope.  She reports that around 3 weeks ago she had a different type of rash which has been improving.   Past Medical History Past Medical History:  Diagnosis Date   Anxiety    Asthma    COPD (chronic obstructive pulmonary disease) (HCC)    Coronary artery disease    DDD (degenerative disc disease)    GERD (gastroesophageal reflux disease)    Hemorrhoids    HIT (heparin-induced thrombocytopenia) (HCC)    Hyperlipemia    Hypertension    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) 12/15/2017   Iron deficiency anemia 11/23/2015   Myocardial infarction [facility]) 2019   widow maker   PONV (postoperative nausea and vomiting)    Pulmonary emboli (HCC) 12/07/2015   Rocky Mountain spotted fever    TMJ (temporomandibular joint disorder)    Type 2 diabetes mellitus (HCC) 12/15/2017   Patient Active Problem List   Diagnosis Date Noted   Dermatitis 08/14/2023   History of lumbar fusion 08/11/2023   Hair loss 08/03/2023   Obstructive sleep apnea 06/21/2023   Ventral hernia without obstruction or gangrene 01/11/2023   Inflammatory disorder of digestive system 01/17/2022   Screen for colon cancer 11/24/2020   History of cancer of left breast 11/24/2020   History of external beam  radiation therapy 11/24/2020   History of lumpectomy of left breast 11/24/2020   Tobacco abuse 07/02/2019   Cancer related pain 03/01/2018   Skin yeast infection 02/07/2018   Hypomagnesemia 01/29/2018   Hypokalemia 01/23/2018   Candida UTI 01/05/2018   Anxiety in cancer patient 12/27/2017   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) 12/15/2017   Type 2 diabetes mellitus (HCC) 12/15/2017   Family history of breast cancer in first degree relative 12/15/2017   Obesity with body mass index of 30.0-39.9 12/04/2017   Hyperlipidemia 03/15/2017   Stress incontinence of urine 12/07/2016   COPD (chronic obstructive pulmonary disease) (HCC) 12/07/2016   Hx of pulmonary embolus 12/07/2015   Iron deficiency anemia 11/23/2015   S/P CABG x 1 11/02/2015   Coronary artery disease involving native coronary artery of native heart with angina pectoris (HCC)    Essential hypertension 10/16/2015   GERD (gastroesophageal reflux disease) 05/16/2013   Depression 05/16/2013   GAD (generalized anxiety disorder) 05/16/2013   Home Medication(s) Prior to Admission medications   Medication Sig Start Date End Date Taking? Authorizing Provider  hydrOXYzine (ATARAX) 25 MG tablet Take 1 tablet (25 mg total) by mouth every 4 (four) hours as needed for itching. 08/19/23  Yes Lonell Grandchild, MD  predniSONE (STERAPRED UNI-PAK 21 TAB) 10 MG (21) TBPK tablet Take by mouth daily. Take 6 tabs by mouth daily  for 2 days, then 5 tabs for 2 days, then 4 tabs for 2 days, then 3 tabs  for 2 days, 2 tabs for 2 days, then 1 tab by mouth daily for 2 days 08/19/23  Yes Juley Giovanetti, Jerilee Field, MD  albuterol (VENTOLIN HFA) 108 (90 Base) MCG/ACT inhaler Inhale 2 puffs into the lungs every 4 (four) hours as needed for wheezing or shortness of breath. 12/30/19   Remus Loffler, PA-C  amLODipine-olmesartan (AZOR) 5-20 MG tablet Take 1 tablet by mouth daily. 08/14/23   St Vena Austria, NP  aspirin EC 81 MG tablet Take 1 tablet  (81 mg total) by mouth every evening. 01/13/23   Pappayliou, Santina Evans A, DO  blood glucose meter kit and supplies Dispense based on patient and insurance preference. Use up to four times daily as directed. (FOR ICD-10 E10.9, E11.9). 11/04/19   Daphine Deutscher, [name], [name]  Budeson-Glycopyrrol-Formoterol (BREZTRI AEROSPHERE) 160-9-4.8 MCG/ACT AERO Inhale into the lungs.    [provider]  Calcium Carb-Cholecalciferol (CALCIUM 600 + D PO) Take 1 tablet by mouth daily.    [provider]  cetirizine (ZYRTEC ALLERGY) 10 MG tablet Take 1 tablet (10 mg total) by mouth daily. 08/14/23   St Vena Austria, NP  clobetasol cream (TEMOVATE) 0.05 % Apply 1 Application topically 2 (two) times daily. 08/03/23   St Vena Austria, NP  colestipol (COLESTID) 1 g tablet Take 2 tablets (2 g total) by mouth 2 (two) times daily. 08/17/23   Doree Albee, PA-C  cyclobenzaprine (FLEXERIL) 10 MG tablet TAKE ONE TABLET BY MOUTH THREE TIMES DAILY AS NEEDED FOR MUSCLE SPASM 11/28/22   Daphine Deutscher, [name], [name]  escitalopram (LEXAPRO) 10 MG tablet Take 1 tablet (10 mg total) by mouth daily. 08/08/23   Daphine Deutscher, [name], [name]  esomeprazole (NEXIUM) 40 MG capsule Take 1 capsule (40 mg total) by mouth daily. 08/29/22   Daphine Deutscher, [name], [name]  Evolocumab (REPATHA SURECLICK) 140 MG/ML SOAJ Inject 140 mg into the skin every 14 (fourteen) days. 06/27/23   Runell Gess, MD  famotidine (PEPCID) 40 MG tablet Take 1 tablet (40 mg total) by mouth at bedtime. 08/17/23   Doree Albee, PA-C  fluticasone (FLONASE) 50 MCG/ACT nasal spray Place 2 sprays into both nostrils daily. Patient taking differently: Place 2 sprays into both nostrils daily as needed for allergies. 09/28/22   Daryll Drown, NP  ibuprofen (ADVIL) 200 MG tablet Take 400 mg by mouth every 6 (six) hours as needed for moderate pain.    [provider]  ipratropium-albuterol (DUONEB) 0.5-2.5 (3) MG/3ML SOLN Take 3 mLs  by nebulization every 6 (six) hours as needed (Foir shortness of breath). 11/18/22   Daphine Deutscher, [name], [name]  LORazepam (ATIVAN) 0.5 MG tablet Take 1 tablet (0.5 mg total) by mouth every 8 (eight) hours as needed for anxiety. 11/29/22   Daphine Deutscher, [name], [name]  magnesium oxide (MAG-OX) 400 MG tablet Take 1 tablet (400 mg total) by mouth daily. Patient taking differently: Take 800 mg by mouth 2 (two) times daily. 11/29/22   Daphine Deutscher, [name], [name]  MELATONIN PO Take 1 tablet by mouth at bedtime as needed (sleep).    [provider]  metFORMIN (GLUCOPHAGE) 1000 MG tablet Take 1 tablet (1,000 mg total) by mouth 2 (two) times daily. 08/29/22   Daphine Deutscher [name], [name]  metoprolol tartrate (LOPRESSOR) 50 MG tablet Take 1 tablet (50 mg total) by mouth 2 (two) times daily. 08/29/22   Daphine Deutscher, [name], [name]  nitroGLYCERIN (NITROSTAT) 0.4 MG SL tablet Place 1 tablet (0.4 mg total) under the tongue every 5 (five) minutes as needed for chest  pain. 06/19/19 03/09/23  [facility], Roe Rutherford, PA  ondansetron (ZOFRAN-ODT) 8 MG disintegrating tablet Take 1 tablet (8 mg total) by mouth every 6 (six) hours as needed for nausea or vomiting. 07/04/23   Mechele Claude, MD  oxyCODONE (ROXICODONE) 5 MG immediate release tablet Take 1 tablet (5 mg total) by mouth every 6 (six) hours as needed. 01/11/23   Pappayliou, Santina Evans A, DO  Vitamin D, Ergocalciferol, (DRISDOL) 1.25 MG (50000 UNIT) CAPS capsule Take 1 capsule (50,000 Units total) by mouth every 7 (seven) days. 07/10/23   Mechele Claude, MD                                                                                                                                    Past Surgical History Past Surgical History:  Procedure Laterality Date   CARDIAC CATHETERIZATION N/A 10/29/2015   Procedure: Left Heart Cath and Coronary Angiography;  Surgeon: Runell Gess, MD;  Location: [facility] INVASIVE CV LAB;  Service: Cardiovascular;  Laterality: N/A;   CHOLECYSTECTOMY      CORONARY ARTERY BYPASS GRAFT N/A 11/02/2015   Procedure: CORONARY ARTERY BYPASS GRAFTING (CABG)x 1 using left internal mammary artery.;  Surgeon: Loreli Slot, MD;  Location: [facility] OR;  Service: Open Heart Surgery;  Laterality: N/A;   LEFT OOPHORECTOMY Left    Related to tubal torsion.  Bilateral salpingectomy also.  Remaining uterus and right ovary   LUMBAR FUSION     TEE WITHOUT CARDIOVERSION N/A 11/02/2015   Procedure: TRANSESOPHAGEAL ECHOCARDIOGRAM (TEE);  Surgeon: Loreli Slot, MD;  Location: [facility] OR;  Service: Open Heart Surgery;  Laterality: N/A;   XI ROBOTIC ASSISTED VENTRAL HERNIA N/A 01/11/2023   Procedure: XI ROBOTIC ASSISTED VENTRAL HERNIA REPAIR W/ MESH- SUPRAUMBILICAL;  Surgeon: Lewie Chamber, DO;  Location: [facility] ORS;  Service: General;  Laterality: N/A;   Family History Family History  Problem Relation Age of Onset   Colon polyps Father    Stomach cancer Paternal Grandmother    Esophageal cancer Maternal Grandfather    Breast cancer Mother    Ovarian cancer Other        Maternal Side Great  Aunt   Colon cancer Neg Hx     Social History Social History   Tobacco Use   Smoking status: Every Day    Current packs/day: 1.00    Types: Cigarettes   Smokeless tobacco: Never   Tobacco comments:    Patient has been smoking 38years//PAP 8/12  Vaping Use   Vaping status: Never Used  Substance Use Topics   Alcohol use: No    Alcohol/week: 0.0 standard drinks of alcohol   Drug use: No   Allergies Atorvastatin, Iohexol, Ace inhibitors, Farxiga [dapagliflozin], Heparin, Iodinated contrast media, Penicillins, and Sulfa antibiotics  Review of Systems Review of Systems  All other systems reviewed and are negative.   Physical Exam Vital Signs  I have reviewed the triage vital signs BP Marland Kitchen)  142/98 (BP Location: Right Arm)   Pulse 77   Temp 98 F (36.7 C) (Axillary)   Resp 16   Ht 5\' 8"  (1.727 m)   Wt 94.3 kg   LMP 06/03/2017 (Exact Date)   SpO2  95%   BMI 31.63 kg/m  Physical Exam Vitals and nursing note reviewed.  Constitutional:      Appearance: Normal appearance.  HENT:     Head: Normocephalic and atraumatic.     Mouth/Throat:     Mouth: Mucous membranes are moist.     Pharynx: No posterior oropharyngeal erythema.     Comments: No uvular swelling, tongue swelling, oropharynx clear.  Very mild swelling to lower lip. Eyes:     Conjunctiva/sclera: Conjunctivae normal.  Cardiovascular:     Rate and Rhythm: Normal rate.  Pulmonary:     Effort: Pulmonary effort is normal. No respiratory distress.     Breath sounds: No wheezing.  Abdominal:     General: Abdomen is flat.  Musculoskeletal:        General: No deformity.  Skin:    General: Skin is warm and dry.     Capillary Refill: Capillary refill takes less than 2 seconds.     Comments: Diffuse urticarial rash over trunk and extremities.  Few scattered excoriations which patient reports were remnants of her old rash.  Neurological:     General: No focal deficit present.     Mental Status: She is alert. Mental status is at baseline.  Psychiatric:        Mood and Affect: Mood normal.        Behavior: Behavior normal.     ED Results and Treatments Labs (all labs ordered are listed, but only abnormal results are displayed) Labs Reviewed - No data to display                                                                                                                        Radiology No results found.  Pertinent labs & imaging results that were available during my care of the patient were reviewed by me and considered in my medical decision making (see MDM for details).  Medications Ordered in ED Medications  dexamethasone (DECADRON) injection 10 mg (has no administration in time range)  Procedures Procedures  (including critical  care time)  Medical Decision Making / ED Course   MDM:  57 year old female presenting with hives.  On exam, she has diffuse hives.  Unclear cause.  Only potential trigger would be Lexapro.  However, patient did not have this reaction immediately after taking medication.  I did advise that she should stop taking the Lexapro to see if this helps with her symptoms.  Given that she appears extremely itchy we will give shot of Decadron and steroid pack, hydroxyzine prescription.  Hopefully this will help some.  No evidence of any anaphylactic reaction.  No evidence of other dangerous skin infection such as SJS/TN.  Discussed strict return precautions for any signs of severe allergic reaction. Advised f/u with her dermatologist.       Additional history obtained: -Additional history obtained from spouse -External records from outside source obtained and reviewed including: Chart review including previous notes, labs, imaging, consultation notes including recent PMD notes    Medicines ordered and prescription drug management: Meds ordered this encounter  Medications   dexamethasone (DECADRON) injection 10 mg   predniSONE (STERAPRED UNI-PAK 21 TAB) 10 MG (21) TBPK tablet    Sig: Take by mouth daily. Take 6 tabs by mouth daily  for 2 days, then 5 tabs for 2 days, then 4 tabs for 2 days, then 3 tabs for 2 days, 2 tabs for 2 days, then 1 tab by mouth daily for 2 days    Dispense:  42 tablet    Refill:  0   hydrOXYzine (ATARAX) 25 MG tablet    Sig: Take 1 tablet (25 mg total) by mouth every 4 (four) hours as needed for itching.    Dispense:  20 tablet    Refill:  0    -I have reviewed the patients home medicines and have made adjustments as needed Social Determinants of Health:  Diagnosis or treatment significantly limited by social determinants of health: obesity    Co morbidities that complicate the patient evaluation  Past Medical History:  Diagnosis Date   Anxiety    Asthma     COPD (chronic obstructive pulmonary disease) (HCC)    Coronary artery disease    DDD (degenerative disc disease)    GERD (gastroesophageal reflux disease)    Hemorrhoids    HIT (heparin-induced thrombocytopenia) (HCC)    Hyperlipemia    Hypertension    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) 12/15/2017   Iron deficiency anemia 11/23/2015   Myocardial infarction [facility]) 2019   widow maker   PONV (postoperative nausea and vomiting)    Pulmonary emboli (HCC) 12/07/2015   Rocky Mountain spotted fever    TMJ (temporomandibular joint disorder)    Type 2 diabetes mellitus (HCC) 12/15/2017      Dispostion: Disposition decision including need for hospitalization was considered, and patient discharged from emergency department.    Final Clinical Impression(s) / ED Diagnoses Final diagnoses:  Hives     This chart was dictated using voice recognition software.  Despite best efforts to proofread,  errors can occur which can change the documentation meaning.    Lonell Grandchild, MD 08/19/23 1011

## 2023-08-19 NOTE — ED Triage Notes (Signed)
Pt previously had spots that itched on her back and went to her PCP They ordered steroids. Pt now has a rash all over her body. Pt has swelling and hives all over her body.

## 2023-08-19 NOTE — Discharge Instructions (Signed)
We evaluated you for your hives.  I am not quite sure what caused your hives since you do not have any new exposures recently.  It is possible that the Lexapro has triggered them, I would try to hold it for a day or 2 and see if this helps.  We gave you a steroid shot in the emergency department.  I have also prescribed you a steroid Dosepak.  Please take this as prescribed.  I prescribed you a medication called hydroxyzine for your itching.  This sometimes can help a little bit more than Benadryl.  You can take it every 4 hours as needed for itching.  Do not take Benadryl at the same time as the medicines together can make you very itchy.  If 25 mg of hydroxyzine does not help you can take 50 mg (2 tablets).  Please schedule follow-up with your dermatologist.  Sometimes, hives can occur without any clear reason.  Sometimes this can even last for a few weeks.  We did not see any signs of a severe allergic reaction on your physical examination.  If you do notice any new or worsening symptoms such as swelling of your tongue, difficulty swallowing, difficulty breathing, wheezing, lightheadedness or dizziness, abdominal pain, vomiting or diarrhea, please return immediately to the emergency department as these could be signs of a severe allergic reaction.

## 2023-08-19 NOTE — Telephone Encounter (Signed)
Mychart message sent to pt of the results from sleep study. Cpap order has been placed.

## 2023-08-22 ENCOUNTER — Ambulatory Visit: Payer: No Typology Code available for payment source

## 2023-08-23 LAB — ANTI-SMOOTH MUSCLE ANTIBODY, IGG: Actin (Smooth Muscle) Antibody (IGG): <20 U (ref <20)

## 2023-08-23 LAB — IGA: Immunoglobulin A: 106 mg/dL (ref 47–310)

## 2023-08-23 LAB — MITOCHONDRIAL ANTIBODIES: Mitochondrial M2 Ab, IgG: <=20 U (ref <=20.0)

## 2023-08-23 LAB — IGG: IgG (Immunoglobin G), Serum: 1150 mg/dL (ref 600–1640)

## 2023-08-23 LAB — ALPHA-1-ANTITRYPSIN: A-1 Antitrypsin, Ser: 157 mg/dL (ref 83–199)

## 2023-08-23 LAB — ANA: Anti Nuclear Antibody (ANA): NEGATIVE

## 2023-08-23 LAB — CERULOPLASMIN: Ceruloplasmin: 27 mg/dL (ref 14–48)

## 2023-08-25 ENCOUNTER — Telehealth: Payer: Self-pay | Admitting: Nurse Practitioner

## 2023-08-28 LAB — HORMONE PANEL (T4,TSH,FSH,TESTT,SHBG,DHEA,ETC)
DHEA-Sulfate, LCMS: 26 ug/dL
Estradiol, Serum, MS: 2.3 pg/mL
Estrone Sulfate: <10 ng/dL
Follicle Stimulating Hormone: 29 m[IU]/mL
Free T-3: 2.5 pg/mL
Free Testosterone, Serum: 1 pg/mL — ABNORMAL LOW
Progesterone, Serum: <10 ng/dL
Sex Hormone Binding Globulin: 24.5 nmol/L
T4: 7.2 ug/dL
TSH: 1.1 uU/mL
Testosterone, Serum (Total): 3.9 ng/dL
Testosterone-% Free: 2.6 %
Triiodothyronine (T-3), Serum: 74 ng/dL

## 2023-09-05 ENCOUNTER — Ambulatory Visit: Payer: Managed Care, Other (non HMO) | Admitting: Pulmonary Disease

## 2023-09-08 ENCOUNTER — Other Ambulatory Visit: Payer: Self-pay | Admitting: Nurse Practitioner

## 2023-09-08 DIAGNOSIS — E1165 Type 2 diabetes mellitus with hyperglycemia: Secondary | ICD-10-CM

## 2023-09-08 DIAGNOSIS — I10 Essential (primary) hypertension: Secondary | ICD-10-CM

## 2023-09-08 DIAGNOSIS — L309 Dermatitis, unspecified: Secondary | ICD-10-CM

## 2023-09-08 DIAGNOSIS — K219 Gastro-esophageal reflux disease without esophagitis: Secondary | ICD-10-CM

## 2023-09-08 DIAGNOSIS — F411 Generalized anxiety disorder: Secondary | ICD-10-CM

## 2023-09-13 ENCOUNTER — Encounter: Payer: Self-pay | Admitting: Family Medicine

## 2023-09-13 ENCOUNTER — Other Ambulatory Visit: Payer: Self-pay | Admitting: Emergency Medicine

## 2023-09-13 ENCOUNTER — Ambulatory Visit: Payer: No Typology Code available for payment source | Admitting: Family Medicine

## 2023-09-13 DIAGNOSIS — J40 Bronchitis, not specified as acute or chronic: Secondary | ICD-10-CM

## 2023-09-13 DIAGNOSIS — Z87891 Personal history of nicotine dependence: Secondary | ICD-10-CM

## 2023-09-13 DIAGNOSIS — J329 Chronic sinusitis, unspecified: Secondary | ICD-10-CM | POA: Diagnosis not present

## 2023-09-13 DIAGNOSIS — Z122 Encounter for screening for malignant neoplasm of respiratory organs: Secondary | ICD-10-CM

## 2023-09-13 DIAGNOSIS — F17210 Nicotine dependence, cigarettes, uncomplicated: Secondary | ICD-10-CM

## 2023-09-13 MED ORDER — LEVOFLOXACIN 500 MG PO TABS
500.0000 mg | ORAL_TABLET | Freq: Every day | ORAL | 0 refills | Status: DC
Start: 2023-09-13 — End: 2023-11-07

## 2023-09-13 MED ORDER — BENZONATATE 200 MG PO CAPS: 200.0000 mg | ORAL_CAPSULE | Freq: Three times a day (TID) | ORAL | 0 refills | Status: DC | PRN

## 2023-09-13 NOTE — Progress Notes (Signed)
Subjective:    Patient ID: Brenda Cruz, female    DOB: 10-09-1966, 57 y.o.   MRN: [id number]   HPI: Brenda Cruz is a 57 y.o. female presenting for COUGH AND SORE THROAT. Tight in chest. Thick posterior drainage. Dark color. Onset with fever 5 days ago. Temp 100.9 today. A little dyspneic with cough today. HA.       08/14/2023   12:14 PM 08/03/2023    2:43 PM 07/04/2023    3:30 PM 03/09/2023   12:03 PM 11/29/2022    9:21 AM  Depression screen PHQ 2/9  Decreased Interest 1 1 0 0 1  Down, Depressed, Hopeless 0 1 0 0 1  PHQ - 2 Score 1 2 0 0 2  Altered sleeping 3 1  1 2   Tired, decreased energy 3 3  1 2   Change in appetite 0 0  0 1  Feeling bad or failure about yourself  0 0  0 0  Trouble concentrating 1 1  0 1  Moving slowly or fidgety/restless 0 0  0 0  Suicidal thoughts 0 0  0 0  PHQ-9 Score 8 7  2 8   Difficult doing work/chores Not difficult at all Not difficult at all  Not difficult at all Somewhat difficult     Relevant past medical, surgical, family and social history reviewed and updated as indicated.  Interim medical history since our last visit reviewed. Allergies and medications reviewed and updated.  ROS:  Review of Systems  Constitutional:  Positive for appetite change (decreased) and fever.  HENT: Negative.    Eyes:  Negative for visual disturbance.  Respiratory:  Negative for shortness of breath.   Cardiovascular:  Negative for chest pain.  Gastrointestinal:  Negative for abdominal pain.  Musculoskeletal:  Negative for arthralgias.     Social History   Tobacco Use  Smoking Status Every Day   Current packs/day: 1.00   Types: Cigarettes  Smokeless Tobacco Never  Tobacco Comments   Patient has been smoking 38years//PAP 8/12       Objective:     Wt Readings from Last 3 Encounters:  08/19/23 208 lb (94.3 kg)  08/17/23 211 lb (95.7 kg)  08/14/23 207 lb 12.8 oz (94.3 kg)     Exam deferred. Video visit performed.   Assessment & Plan:    1. Sinobronchitis     Meds ordered this encounter  Medications   levofloxacin (LEVAQUIN) 500 MG tablet    Sig: Take 1 tablet (500 mg total) by mouth daily. For 10 days    Dispense:  10 tablet    Refill:  0   benzonatate (TESSALON) 200 MG capsule    Sig: Take 1 capsule (200 mg total) by mouth 3 (three) times daily as needed for cough.    Dispense:  20 capsule    Refill:  0    No orders of the defined types were placed in this encounter.     Diagnoses and all orders for this visit:  Sinobronchitis  Other orders -     levofloxacin (LEVAQUIN) 500 MG tablet; Take 1 tablet (500 mg total) by mouth daily. For 10 days -     benzonatate (TESSALON) 200 MG capsule; Take 1 capsule (200 mg total) by mouth 3 (three) times daily as needed for cough.    Virtual Visit  Note  I discussed the limitations, risks, security and privacy concerns of performing an evaluation and management service by video and the availability of  in person appointments. The patient was identified with two identifiers. Pt.expressed understanding and agreed to proceed. Pt. Is at home. Dr. Darlyn Read is in his office.  Follow Up Instructions:   I discussed the assessment and treatment plan with the patient. The patient was provided an opportunity to ask questions and all were answered. The patient agreed with the plan and demonstrated an understanding of the instructions.   The patient was advised to call back or seek an in-person evaluation if the symptoms worsen or if the condition fails to improve as anticipated.   Total minutes contact time: 14   Follow up plan: Return if symptoms worsen or fail to improve.  Mechele Claude, MD [place]

## 2023-09-21 ENCOUNTER — Ambulatory Visit: Payer: No Typology Code available for payment source | Admitting: Physician Assistant

## 2023-09-21 ENCOUNTER — Encounter: Payer: Self-pay | Admitting: Physician Assistant

## 2023-09-21 DIAGNOSIS — F17210 Nicotine dependence, cigarettes, uncomplicated: Secondary | ICD-10-CM

## 2023-09-21 NOTE — Patient Instructions (Signed)

## 2023-09-21 NOTE — Progress Notes (Signed)
Virtual Visit via Telephone Note  I connected with DESHONDA BOWN on 09/21/23 at  3:30 PM EST by telephone and verified that I am speaking with the correct person using two identifiers.  Location: Patient: Home Provider: Remotely   I discussed the limitations, risks, security and privacy concerns of performing an evaluation and management service by telephone and the availability of in person appointments. I also discussed with the patient that there may be a patient responsible charge related to this service. The patient expressed understanding and agreed to proceed.    Shared Decision Making Visit Lung Cancer Screening Program [phone])   Eligibility: Age 57 y.o. Pack Years Smoking History Calculation: 38 Recent History of coughing up blood: No Unexplained weight loss? No ( >Than 15 pounds within the last 6 months ) Prior History Lung / other cancer: Yes (Breast CA 2018) (Diagnosis within the last 5 years already requiring surveillance chest CT Scans). Smoking Status Current Smoker  Visit Components:     Discussion included one or more decision making aids. Yes Discussion included risk/benefits of screening. Yes Discussion included potential follow up diagnostic testing for abnormal scans. Yes Discussion included meaning and risk of Over Diagnosis. Yes Discussion included meaning and risk of False Positives. Yes Discussion included meaning of total radiation exposure. Yes  Counseling Included: Importance of adherence to annual lung cancer LDCT screening. Yes Impact of comorbidities on ability to participate in the program. Yes Ability and willingness to under diagnostic treatment. Yes  Smoking Cessation Counseling: Current Smokers:  Discussed importance of smoking cessation. Yes Information about tobacco cessation classes and interventions provided to patient. Yes Symptomatic Patient. No  Counseling: NA Diagnosis Code: Tobacco Use Z72.0 Asymptomatic Patient  Yes   Counseling (Intermediate counseling: > three minutes counseling) [id number]    Rutherford Guys, PA-C

## 2023-09-22 ENCOUNTER — Encounter: Payer: Self-pay | Admitting: Internal Medicine

## 2023-09-22 ENCOUNTER — Ambulatory Visit (HOSPITAL_BASED_OUTPATIENT_CLINIC_OR_DEPARTMENT_OTHER): Payer: No Typology Code available for payment source

## 2023-09-26 ENCOUNTER — Encounter (HOSPITAL_BASED_OUTPATIENT_CLINIC_OR_DEPARTMENT_OTHER): Payer: Self-pay

## 2023-09-28 ENCOUNTER — Telehealth: Payer: Self-pay | Admitting: Nurse Practitioner

## 2023-09-28 NOTE — Telephone Encounter (Signed)
Disregard this encounter. Encounter made on incorrect pt

## 2023-09-28 NOTE — Telephone Encounter (Signed)
Okay for note

## 2023-09-28 NOTE — Telephone Encounter (Signed)
Yes, please write. WS

## 2023-10-03 ENCOUNTER — Encounter: Payer: No Typology Code available for payment source | Admitting: Internal Medicine

## 2023-10-10 ENCOUNTER — Encounter: Payer: Self-pay | Admitting: Nurse Practitioner

## 2023-10-10 NOTE — Telephone Encounter (Signed)
 Care team updated and letter sent for eye exam notes.

## 2023-10-20 ENCOUNTER — Telehealth: Payer: Self-pay | Admitting: Pulmonary Disease

## 2023-10-20 NOTE — Telephone Encounter (Signed)
 Please schedule patient for follow up visit to review her sleep study results. She has severe sleep apnea. Can be a virtual visit if needed.  Thanks, JD

## 2023-10-24 NOTE — Telephone Encounter (Signed)
 Patient states she can't afford cpap therapy. She will have to pay $100 per month after down payment of $400. She would like to know if there any other alternatives?

## 2023-10-25 ENCOUNTER — Other Ambulatory Visit: Payer: Self-pay | Admitting: Nurse Practitioner

## 2023-10-25 DIAGNOSIS — F411 Generalized anxiety disorder: Secondary | ICD-10-CM

## 2023-10-25 DIAGNOSIS — E1165 Type 2 diabetes mellitus with hyperglycemia: Secondary | ICD-10-CM

## 2023-10-25 DIAGNOSIS — I10 Essential (primary) hypertension: Secondary | ICD-10-CM

## 2023-10-25 DIAGNOSIS — K219 Gastro-esophageal reflux disease without esophagitis: Secondary | ICD-10-CM

## 2023-10-25 MED ORDER — AMLODIPINE-OLMESARTAN 5-20 MG PO TABS: 1.0000 | ORAL_TABLET | Freq: Every day | ORAL | 0 refills | Status: DC

## 2023-10-25 NOTE — Telephone Encounter (Signed)
 Given the severity of her sleep apnea the best option is CPAP therapy. Alternatives include weight loss and quitting smoking which can help decrease the severity of sleep apnea but I am not entirely sure it will make it go away completely.  Dr. Francine Graven

## 2023-10-25 NOTE — Telephone Encounter (Signed)
 Copied from CRM [id number]. Topic: Clinical - Medication Refill >> Oct 25, 2023 10:48 AM Benton KIDD wrote: Most Recent Primary Care Visit:  Provider: ZOLLIE LOWERS  Department: [facility]  Visit Type: ACUTE  Date: 09/13/2023  Medication: metFORMIN  (GLUCOPHAGE ) 1000 MG tablet metoprolol  tartrate (LOPRESSOR ) 50 MG tablet esomeprazole  (NEXIUM ) 40 MG capsule oxyCODONE  (ROXICODONE ) 5 MG immediate release tablet LORazepam  (ATIVAN ) 0.5 MG tablet    Has the patient contacted their pharmacy?  (Agent: If no, request that the patient contact the pharmacy for the refill. If patient does not wish to contact the pharmacy document the reason why and proceed with request.) (Agent: If yes, when and what did the pharmacy advise?)  Is this the correct pharmacy for this prescription?  If no, delete pharmacy and type the correct one.  This is the patient's preferred pharmacy:  [facility], [place] - [address] [place] [address] Phone: [phone] Fax: [phone]   Has the prescription been filled recently?   Is the patient out of the medication?   Has the patient been seen for an appointment in the last year OR does the patient have an upcoming appointment?   Can we respond through MyChart?   Agent: Please be advised that Rx refills may take up to 3 business days. We ask that you follow-up with your pharmacy.

## 2023-10-26 ENCOUNTER — Other Ambulatory Visit: Payer: Self-pay | Admitting: Nurse Practitioner

## 2023-10-26 ENCOUNTER — Ambulatory Visit: Payer: No Typology Code available for payment source | Admitting: Nurse Practitioner

## 2023-10-26 DIAGNOSIS — L659 Nonscarring hair loss, unspecified: Secondary | ICD-10-CM

## 2023-10-26 DIAGNOSIS — I10 Essential (primary) hypertension: Secondary | ICD-10-CM

## 2023-10-26 MED ORDER — AMLODIPINE-OLMESARTAN 5-20 MG PO TABS: 1.0000 | ORAL_TABLET | Freq: Every day | ORAL | 0 refills | Status: DC

## 2023-10-26 MED ORDER — OXYCODONE HCL 5 MG PO TABS: 5.0000 mg | ORAL_TABLET | Freq: Four times a day (QID) | ORAL | 0 refills | Status: DC | PRN

## 2023-10-26 NOTE — Progress Notes (Signed)
 Meds ordered this encounter  Medications   amLODipine -olmesartan  (AZOR ) 5-20 MG tablet    Sig: Take 1 tablet by mouth daily.    Dispense:  30 tablet    Refill:  0    Supervising Provider:   DETTINGER, JOSHUA A [[id number]]   oxyCODONE  (ROXICODONE ) 5 MG immediate release tablet    Sig: Take 1 tablet (5 mg total) by mouth every 6 (six) hours as needed.    Dispense:  16 tablet    Refill:  0    Supervising Provider:   MARYANNE CHEW A [[id number]]   [name], [name]

## 2023-10-28 NOTE — Telephone Encounter (Signed)
 Mychart sent to pt of the response from Dr. Francine Graven.

## 2023-11-02 ENCOUNTER — Other Ambulatory Visit: Payer: Self-pay

## 2023-11-02 MED ORDER — FLUCONAZOLE 150 MG PO TABS
150.0000 mg | ORAL_TABLET | Freq: Once | ORAL | 0 refills | Status: AC
Start: 2023-11-02 — End: 2023-11-02

## 2023-11-07 ENCOUNTER — Encounter: Payer: Self-pay | Admitting: Surgery

## 2023-11-07 ENCOUNTER — Ambulatory Visit: Payer: No Typology Code available for payment source | Admitting: Surgery

## 2023-11-07 VITALS — BP 166/78 | HR 75 | Temp 98.0°F | Resp 14 | Ht 68.0 in | Wt 214.0 lb

## 2023-11-07 DIAGNOSIS — R198 Other specified symptoms and signs involving the digestive system and abdomen: Secondary | ICD-10-CM

## 2023-11-07 MED ORDER — PREDNISONE 50 MG PO TABS: 50.0000 mg | ORAL_TABLET | ORAL | 0 refills | Status: DC

## 2023-11-07 MED ORDER — DIPHENHYDRAMINE HCL 50 MG PO TABS: 50.0000 mg | ORAL_TABLET | Freq: Once | ORAL | 0 refills | Status: AC

## 2023-11-07 NOTE — Progress Notes (Unsigned)
[facility] History and Physical  Reason for Referral:*** Referring Physician: ***  Chief Complaint   Hernia     Brenda Cruz is a 58 y.o. female.  HPI: ***  Past Medical History:  Diagnosis Date  . Anxiety   . Asthma   . COPD (chronic obstructive pulmonary disease) (HCC)   . Coronary artery disease   . DDD (degenerative disc disease)   . GERD (gastroesophageal reflux disease)   . Hemorrhoids   . HIT (heparin-induced thrombocytopenia) (HCC)   . Hyperlipemia   . Hypertension   . 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) 12/15/2017  . Iron deficiency anemia 11/23/2015  . Myocardial infarction [facility]) 2019   widow maker  . PONV (postoperative nausea and vomiting)   . Pulmonary emboli (HCC) 12/07/2015  . [facility] spotted fever   . TMJ (temporomandibular joint disorder)   . Type 2 diabetes mellitus (HCC) 12/15/2017    Past Surgical History:  Procedure Laterality Date  . CARDIAC CATHETERIZATION N/A 10/29/2015   Procedure: Left Heart Cath and Coronary Angiography;  Surgeon: Runell Gess, MD;  Location: [facility] INVASIVE CV LAB;  Service: Cardiovascular;  Laterality: N/A;  . CHOLECYSTECTOMY    . CORONARY ARTERY BYPASS GRAFT N/A 11/02/2015   Procedure: CORONARY ARTERY BYPASS GRAFTING (CABG)x 1 using left internal mammary artery.;  Surgeon: Loreli Slot, MD;  Location: [facility] OR;  Service: Open Heart Surgery;  Laterality: N/A;  . LEFT OOPHORECTOMY Left    Related to tubal torsion.  Bilateral salpingectomy also.  Remaining uterus and right ovary  . LUMBAR FUSION    . TEE WITHOUT CARDIOVERSION N/A 11/02/2015   Procedure: TRANSESOPHAGEAL ECHOCARDIOGRAM (TEE);  Surgeon: Loreli Slot, MD;  Location: [facility] OR;  Service: Open Heart Surgery;  Laterality: N/A;  . XI ROBOTIC ASSISTED VENTRAL HERNIA N/A 01/11/2023   Procedure: XI ROBOTIC ASSISTED VENTRAL HERNIA REPAIR W/ MESH- SUPRAUMBILICAL;  Surgeon: Lewie Chamber, DO;  Location: [facility] ORS;   Service: General;  Laterality: N/A;    Family History  Problem Relation Age of Onset  . Colon polyps Father   . Stomach cancer Paternal Grandmother   . Esophageal cancer Maternal Grandfather   . Breast cancer Mother   . Ovarian cancer Other        Maternal Side Great  Aunt  . Colon cancer Neg Hx     Social History   Tobacco Use  . Smoking status: Every Day    Current packs/day: 1.00    Types: Cigarettes  . Smokeless tobacco: Never  . Tobacco comments:    Patient has been smoking 38years//PAP 8/12  Vaping Use  . Vaping status: Never Used  Substance Use Topics  . Alcohol use: No    Alcohol/week: 0.0 standard drinks of alcohol  . Drug use: No    Medications: I have reviewed the patient's current medications. 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   Sulfa Antibiotics Itching, Rash, Other (See Comments)        Medication List        Accurate as of November 07, 2023  9:24 AM. If you have any questions, ask your nurse or doctor.          STOP taking these medications    aspirin EC 81 MG  tablet Stopped by: Cherie Lasalle A Tanika Bracco   benzonatate 200 MG capsule Commonly known as: TESSALON Stopped by: Shreya Lacasse A Zarius Furr   colestipol 1 g tablet Commonly known as: Colestid Stopped by: Erman Thum A Erielle Gawronski   escitalopram 10 MG tablet Commonly known as: Lexapro Stopped by: Kortez Murtagh A Letti Towell   levofloxacin 500 MG tablet Commonly known as: LEVAQUIN Stopped by: Chen Saadeh A Shelsea Hangartner   oxyCODONE 5 MG immediate release tablet Commonly known as: Roxicodone Stopped by: Jamont Mellin A Billyjack Trompeter   predniSONE 10 MG (21) Tbpk tablet Commonly known as: STERAPRED UNI-PAK 21 TAB Stopped by: Josephina Melcher A Shaeleigh Graw   Repatha SureClick 140 MG/ML Soaj Generic drug: Evolocumab Stopped by: Vernon Ariel A Stacia Feazell       TAKE these medications    albuterol 108 (90 Base) MCG/ACT inhaler Commonly known as: VENTOLIN HFA Inhale 2 puffs into the lungs every 4 (four) hours as needed for wheezing or shortness of breath.   amLODipine-olmesartan 5-20 MG tablet Commonly known as: AZOR Take 1 tablet by mouth daily.   blood glucose meter kit and supplies Dispense based on patient and insurance preference. Use up to four times daily as directed. (FOR ICD-10 E10.9, E11.9).   Breztri Aerosphere 160-9-4.8 MCG/ACT Aero Generic drug: Budeson-Glycopyrrol-Formoterol Inhale into the lungs.   CALCIUM 600 + D PO Take 1 tablet by mouth daily.   cetirizine 10 MG tablet Commonly known as: ZYRTEC TAKE ONE (1) TABLET BY MOUTH EVERY DAY   clobetasol cream 0.05 % Commonly known as: TEMOVATE Apply 1 Application topically 2 (two) times daily.   cyclobenzaprine 10 MG tablet Commonly known as: FLEXERIL TAKE ONE TABLET BY MOUTH THREE TIMES DAILY AS NEEDED FOR MUSCLE SPASM   esomeprazole 40 MG capsule Commonly known as: NEXIUM TAKE ONE CAPSULE BY MOUTH DAILY   famotidine 40 MG tablet Commonly known as: Pepcid Take 1 tablet (40 mg total) by mouth at bedtime.   fluticasone 50 MCG/ACT  nasal spray Commonly known as: FLONASE Place 2 sprays into both nostrils daily. What changed:  when to take this reasons to take this   hydrOXYzine 25 MG tablet Commonly known as: ATARAX Take 1 tablet (25 mg total) by mouth every 4 (four) hours as needed for itching.   ibuprofen 200 MG tablet Commonly known as: ADVIL Take 400 mg by mouth every 6 (six) hours as needed for moderate pain.   ipratropium-albuterol 0.5-2.5 (3) MG/3ML Soln Commonly known as: DUONEB Take 3 mLs by nebulization every 6 (six) hours as needed (Foir shortness of breath).   LORazepam 0.5 MG tablet Commonly known as: ATIVAN TAKE 1 TABLET BY MOUTH EVERY 8 HOURS AS NEEDED FOR ANXIETY   magnesium oxide 400 MG tablet Commonly known as: MAG-OX Take 1 tablet (400 mg total) by mouth daily. What changed:  how much to take when  to take this   MELATONIN PO Take 1 tablet by mouth at bedtime as needed (sleep).   metFORMIN 1000 MG tablet Commonly known as: GLUCOPHAGE TAKE ONE (1) TABLET BY MOUTH TWO (2) TIMES DAILY   metoprolol tartrate 50 MG tablet Commonly known as: LOPRESSOR TAKE ONE (1) TABLET BY MOUTH TWO (2) TIMES DAILY   nitroGLYCERIN 0.4 MG SL tablet Commonly known as: NITROSTAT Place 1 tablet (0.4 mg total) under the tongue every 5 (five) minutes as needed for chest pain.   ondansetron 8 MG disintegrating tablet Commonly known as: ZOFRAN-ODT Take 1 tablet (8 mg total) by mouth every 6 (six) hours as needed for nausea or vomiting.   Vitamin D (Ergocalciferol) 1.25 MG (50000 UNIT) Caps capsule Commonly known as: DRISDOL Take 1 capsule (50,000 Units total) by mouth every 7 (seven) days.         ROS:  {Review of Systems:30496}  Last menstrual period 06/03/2017. Physical Exam  Results: No results found for this or any previous visit (from the past 48 hours).  No results found.   Assessment & Plan:  Brenda Cruz is a 58 y.o. female with ***  -*** -The risk and benefits of *** were  discussed including but not limited to ***.  After careful consideration, ESTEFANNY RYNKIEWICZ has decided to ***.  -Follow up ***  All questions were answered to the satisfaction of the patient.     Theophilus Kinds, DO [facility] [address] [place], [place] [address] [phone] (office)

## 2023-11-09 NOTE — Telephone Encounter (Signed)
Brenda Cruz with [employer] came into the office with CPAP order to be signed by Dr. Francine Graven- Dr. Francine Graven signed order and copy of form was placed in scan. Patient canceled appt in November- called and rescheduled PFT and OV for 02/08/2024.

## 2023-11-17 ENCOUNTER — Ambulatory Visit (HOSPITAL_COMMUNITY)
Admission: RE | Admit: 2023-11-17 | Discharge: 2023-11-17 | Disposition: A | Discharge status: Home / Self Care | Payer: PRIVATE HEALTH INSURANCE | Source: Ambulatory Visit | Attending: Surgery | Admitting: Surgery

## 2023-11-17 DIAGNOSIS — R198 Other specified symptoms and signs involving the digestive system and abdomen: Secondary | ICD-10-CM | POA: Insufficient documentation

## 2023-11-17 LAB — POCT I-STAT CREATININE: Creatinine, Ser: 0.9 mg/dL (ref 0.44–1.00)

## 2023-11-17 MED ORDER — IOHEXOL 300 MG/ML  SOLN
100.0000 mL | Freq: Once | INTRAMUSCULAR | Status: AC | PRN
  Administered 2023-11-17: 100 mL via INTRAVENOUS

## 2023-11-20 ENCOUNTER — Ambulatory Visit (AMBULATORY_SURGERY_CENTER): Payer: No Typology Code available for payment source

## 2023-11-20 VITALS — Ht 68.0 in | Wt 212.0 lb

## 2023-11-20 DIAGNOSIS — Z1211 Encounter for screening for malignant neoplasm of colon: Secondary | ICD-10-CM

## 2023-11-20 DIAGNOSIS — K529 Noninfective gastroenteritis and colitis, unspecified: Secondary | ICD-10-CM

## 2023-11-20 DIAGNOSIS — K219 Gastro-esophageal reflux disease without esophagitis: Secondary | ICD-10-CM

## 2023-11-20 MED ORDER — SUFLAVE 178.7 G PO SOLR: 1.0000 | ORAL | 0 refills | Status: DC

## 2023-11-20 NOTE — Progress Notes (Signed)

## 2023-11-29 ENCOUNTER — Telehealth: Payer: Self-pay | Admitting: Internal Medicine

## 2023-11-29 ENCOUNTER — Telehealth (INDEPENDENT_AMBULATORY_CARE_PROVIDER_SITE_OTHER): Payer: No Typology Code available for payment source | Admitting: Surgery

## 2023-11-29 DIAGNOSIS — R198 Other specified symptoms and signs involving the digestive system and abdomen: Secondary | ICD-10-CM

## 2023-11-29 NOTE — Telephone Encounter (Signed)
Pt states her abdomen is quite distended and she gets a little SOB when walking. She was seen by surgeon 11/07/23 and in OV note it states abdomen was soft tender and non distended. She reports having some nausea, decreased appetite, and lots of diarrhea. Feels like she has gained wt but she is not eating much.States yesterday she had 6 dark brown stools with mucous in the stool. She is concerned that she will not be able to have the procedures on 12/14/23. Pt scheduled to see Boone Master PA 12/11/23 at 1:30pm. Dr. Marina Goodell please advise of any other recommendations.

## 2023-11-29 NOTE — Telephone Encounter (Signed)
Spoke with pt and she is aware of recommendations per Dr. Marina Goodell.

## 2023-11-29 NOTE — Telephone Encounter (Signed)
Inbound call from patient stating that she is scheduled for endoscopy and colonoscopy on 2/27 and she has been having issues with distended abdomen and its continuing to get bigger. Patient is requesting a call to discuss and to see if she can still have procedures. Please advise.

## 2023-11-29 NOTE — Telephone Encounter (Signed)
Reviewed. She just had a CT scan less than 2 weeks ago, which was unremarkable.  This is reassuring. Keep her follow-up with the advanced practitioner. I suspect that she will be able to proceed with her endoscopic examinations. Thanks for checking, Dr. Marina Goodell

## 2023-11-29 NOTE — Telephone Encounter (Signed)
[facility]  Called to update the patient on the results of her recent CT scan.  There was no abnormal pathology within her abdomen or pelvis and they did not note any abdominal wall hernias.  On my look of the CT scan, I also do not see any concern for hernia in the left upper quadrant.  I discussed with her that if the area continues to bother her, we can mark it preoperatively and I can cut down to her abdominal wall to surgically evaluate for hernia, however I would like to hold off on doing this because if there is no hernia, there is no guarantee that this will fix any of her current symptoms.  She states that she is continuing to have abdominal distention and pains throughout her abdomen, and she is scheduled undergo colonoscopy on 2/27.  Advised that she should proceed with the colonoscopy, and if she continues to have persistent issues related to pain in her left upper quadrant from a bulge, to give the office a call and we can always schedule her for surgery at that time.  All questions were answered to her expressed satisfaction.  CT of the abdomen and pelvis (11/17/2023): IMPRESSION: 1. No acute abnormality in the abdomen or pelvis. 2. No abdominal wall hernia identified. 3. Diffuse hepatic steatosis. 4.  Aortic Atherosclerosis (ICD10-I70.0).  Theophilus Kinds, DO [facility] [address] [place], [place] [address] [phone] (office)

## 2023-12-04 ENCOUNTER — Ambulatory Visit (HOSPITAL_BASED_OUTPATIENT_CLINIC_OR_DEPARTMENT_OTHER): Payer: No Typology Code available for payment source

## 2023-12-11 ENCOUNTER — Ambulatory Visit: Payer: No Typology Code available for payment source | Admitting: Gastroenterology

## 2023-12-12 ENCOUNTER — Encounter: Payer: Self-pay | Admitting: Internal Medicine

## 2023-12-13 ENCOUNTER — Other Ambulatory Visit (HOSPITAL_COMMUNITY): Payer: No Typology Code available for payment source

## 2023-12-14 ENCOUNTER — Ambulatory Visit: Payer: No Typology Code available for payment source | Admitting: Internal Medicine

## 2023-12-14 ENCOUNTER — Encounter: Payer: Self-pay | Admitting: Internal Medicine

## 2023-12-14 VITALS — BP 170/88 | HR 88 | Temp 97.3°F | Resp 15 | Ht 68.0 in | Wt 212.0 lb

## 2023-12-14 DIAGNOSIS — K529 Noninfective gastroenteritis and colitis, unspecified: Secondary | ICD-10-CM

## 2023-12-14 DIAGNOSIS — Z1211 Encounter for screening for malignant neoplasm of colon: Secondary | ICD-10-CM | POA: Diagnosis present

## 2023-12-14 DIAGNOSIS — D124 Benign neoplasm of descending colon: Secondary | ICD-10-CM

## 2023-12-14 DIAGNOSIS — K219 Gastro-esophageal reflux disease without esophagitis: Secondary | ICD-10-CM

## 2023-12-14 DIAGNOSIS — K2980 Duodenitis without bleeding: Secondary | ICD-10-CM | POA: Diagnosis not present

## 2023-12-14 DIAGNOSIS — D122 Benign neoplasm of ascending colon: Secondary | ICD-10-CM

## 2023-12-14 DIAGNOSIS — G8929 Other chronic pain: Secondary | ICD-10-CM

## 2023-12-14 MED ORDER — SODIUM CHLORIDE 0.9 % IV SOLN: 500.0000 mL | Freq: Once | INTRAVENOUS | Status: DC

## 2023-12-14 NOTE — Op Note (Signed)
 [facility] Patient Name: Brenda Cruz Procedure Date: 12/14/2023 8:50 AM MRN: [id number] Endoscopist: Wilhemina Bonito. Marina Goodell , MD, [id number] Age: 58 Referring MD:  Date of Birth: October 31, 1965 Gender: Female Account #: [id number] Procedure:                Upper GI endoscopy with biopsies Indications:              Epigastric abdominal pain, Esophageal reflux,                            Diarrhea Medicines:                Monitored Anesthesia Care Procedure:                Pre-Anesthesia Assessment:                           - Prior to the procedure, a History and Physical                            was performed, and patient medications and                            allergies were reviewed. The patient's tolerance of                            previous anesthesia was also reviewed. The risks                            and benefits of the procedure and the sedation                            options and risks were discussed with the patient.                            All questions were answered, and informed consent                            was obtained. Prior Anticoagulants: The patient has                            taken no anticoagulant or antiplatelet agents. ASA                            Grade Assessment: II - A patient with mild systemic                            disease. After reviewing the risks and benefits,                            the patient was deemed in satisfactory condition to                            undergo the procedure.  After obtaining informed consent, the endoscope was                            passed under direct vision. Throughout the                            procedure, the patient's blood pressure, pulse, and                            oxygen saturations were monitored continuously. The                            Olympus Scope [id number] was introduced through the                            mouth, and advanced to the  second part of duodenum.                            The upper GI endoscopy was accomplished without                            difficulty. The patient tolerated the procedure                            well. Scope In: Scope Out: Findings:                 The esophagus was normal.                           The stomach was normal.                           The examined duodenum was normal. Biopsies were                            taken with a cold forceps for histology.                           The cardia and gastric fundus were normal on                            retroflexion. Complications:            No immediate complications. Estimated Blood Loss:     Estimated blood loss: none. Impression:               - Normal esophagus.                           - Normal stomach.                           - Normal examined duodenum. Biopsied. Recommendation:           - Patient has a contact number available for  emergencies. The signs and symptoms of potential                            delayed complications were discussed with the                            patient. Return to normal activities tomorrow.                            Written discharge instructions were provided to the                            patient.                           - Resume previous diet.                           - Continue present medications.                           - Await pathology results. Wilhemina Bonito. Marina Goodell, MD 12/14/2023 9:45:15 AM This report has been signed electronically.

## 2023-12-14 NOTE — Progress Notes (Signed)
 Pt's states no medical or surgical changes since previsit or office visit.

## 2023-12-14 NOTE — Patient Instructions (Signed)
 -Handout on polyps, hemorrhoids provided -await pathology results -repeat colonoscopy in 5 years for surveillance recommended.   -Continue present medications  -follow up with Dr. Marina Goodell if problems with diarrhea persist off metformin  YOU HAD AN ENDOSCOPIC PROCEDURE TODAY AT THE [facility]:   Refer to the procedure report that was given to you for any specific questions about what was found during the examination.  If the procedure report does not answer your questions, please call your gastroenterologist to clarify.  If you requested that your care partner not be given the details of your procedure findings, then the procedure report has been included in a sealed envelope for you to review at your convenience later.  YOU SHOULD EXPECT: Some feelings of bloating in the abdomen. Passage of more gas than usual.  Walking can help get rid of the air that was put into your GI tract during the procedure and reduce the bloating. If you had a lower endoscopy (such as a colonoscopy or flexible sigmoidoscopy) you may notice spotting of blood in your stool or on the toilet paper. If you underwent a bowel prep for your procedure, you may not have a normal bowel movement for a few days.  Please Note:  You might notice some irritation and congestion in your nose or some drainage.  This is from the oxygen used during your procedure.  There is no need for concern and it should clear up in a day or so.  SYMPTOMS TO REPORT IMMEDIATELY:  Following lower endoscopy (colonoscopy or flexible sigmoidoscopy):  Excessive amounts of blood in the stool  Significant tenderness or worsening of abdominal pains  Swelling of the abdomen that is new, acute  Fever of 100F or higher  Following upper endoscopy (EGD)  Vomiting of blood or coffee ground material  New chest pain or pain under the shoulder blades  Painful or persistently difficult swallowing  New shortness of breath  Fever of 100F or higher  Black,  tarry-looking stools  For urgent or emergent issues, a gastroenterologist can be reached at any hour by calling [phone]. Do not use MyChart messaging for urgent concerns.    DIET:  We do recommend a small meal at first, but then you may proceed to your regular diet.  Drink plenty of fluids but you should avoid alcoholic beverages for 24 hours.  ACTIVITY:  You should plan to take it easy for the rest of today and you should NOT DRIVE or use heavy machinery until tomorrow (because of the sedation medicines used during the test).    FOLLOW UP: Our staff will call the number listed on your records the next business day following your procedure.  We will call around 7:15- 8:00 am to check on you and address any questions or concerns that you may have regarding the information given to you following your procedure. If we do not reach you, we will leave a message.     If any biopsies were taken you will be contacted by phone or by letter within the next 1-3 weeks.  Please call us at [phone] if you have not heard about the biopsies in 3 weeks.    SIGNATURES/CONFIDENTIALITY: You and/or your care partner have signed paperwork which will be entered into your electronic medical record.  These signatures attest to the fact that that the information above on your After Visit Summary has been reviewed and is understood.  Full responsibility of the confidentiality of this discharge information lies with  you and/or your care-partner.

## 2023-12-14 NOTE — Progress Notes (Signed)
 Report to PACU, RN, vss, BBS= Clear.

## 2023-12-14 NOTE — Op Note (Signed)
 [facility] Patient Name: Brenda Cruz Procedure Date: 12/14/2023 9:04 AM MRN: [id number] Endoscopist: Wilhemina Bonito. Marina Goodell , MD, [id number] Age: 58 Referring MD:  Date of Birth: 1966-03-20 Gender: Female Account #: [id number] Procedure:                Colonoscopy with cold snare polypectomy x 3;                            biopsies Indications:              Screening for colorectal malignant neoplasm,                            Incidental diarrhea noted Medicines:                Monitored Anesthesia Care Procedure:                Pre-Anesthesia Assessment:                           - Prior to the procedure, a History and Physical                            was performed, and patient medications and                            allergies were reviewed. The patient's tolerance of                            previous anesthesia was also reviewed. The risks                            and benefits of the procedure and the sedation                            options and risks were discussed with the patient.                            All questions were answered, and informed consent                            was obtained. Prior Anticoagulants: The patient has                            taken no anticoagulant or antiplatelet agents. ASA                            Grade Assessment: II - A patient with mild systemic                            disease. After reviewing the risks and benefits,                            the patient was deemed in satisfactory condition to  undergo the procedure.                           After obtaining informed consent, the colonoscope                            was passed under direct vision. Throughout the                            procedure, the patient's blood pressure, pulse, and                            oxygen saturations were monitored continuously. The                            CF [id number] #[id number] was introduced through  the anus                            and advanced to the the cecum, identified by                            appendiceal orifice and ileocecal valve. The                            terminal ileum, ileocecal valve, appendiceal                            orifice, and rectum were photographed. The quality                            of the bowel preparation was excellent. The                            colonoscopy was performed without difficulty. The                            patient tolerated the procedure well. The bowel                            preparation used was SUPREP via split dose                            instruction. Scope In: 9:10:16 AM Scope Out: 9:23:40 AM Scope Withdrawal Time: 0 hours 10 minutes 12 seconds  Total Procedure Duration: 0 hours 13 minutes 24 seconds  Findings:                 The terminal ileum appeared normal.                           Three polyps were found in the descending colon and                            ascending colon. The polyps were 4 to 5 mm in size.  These polyps were removed with a cold snare.                            Resection and retrieval were complete.                           Internal hemorrhoids were found during retroflexion.                           The exam was otherwise without abnormality on                            direct and retroflexion views.                           Biopsies for histology were taken with a cold                            forceps from the entire colon for evaluation of                            microscopic colitis. Complications:            No immediate complications. Estimated blood loss:                            None. Estimated Blood Loss:     Estimated blood loss: none. Impression:               - The examined portion of the ileum was normal.                           - Three 4 to 5 mm polyps in the descending colon                            and in the ascending colon,  removed with a cold                            snare. Resected and retrieved.                           - Internal hemorrhoids.                           - The examination was otherwise normal on direct                            and retroflexion views.                           - Biopsies were taken with a cold forceps from the                            entire colon for evaluation of microscopic colitis. Recommendation:           - Repeat colonoscopy in 5 years  for surveillance.                           - Patient has a contact number available for                            emergencies. The signs and symptoms of potential                            delayed complications were discussed with the                            patient. Return to normal activities tomorrow.                            Written discharge instructions were provided to the                            patient.                           - Resume previous diet.                           - Continue present medications.                           - Await pathology results.                           - If biopsies are unremarkable, talk to your doctor                            about alternatives to metformin, as this may cause                            diarrhea                           - Office follow-up with Dr. Marina Goodell if problems with                            diarrhea persist off metformin Wilhemina Bonito. Marina Goodell, MD 12/14/2023 9:32:34 AM This report has been signed electronically.

## 2023-12-14 NOTE — Progress Notes (Signed)
 Called to room to assist during endoscopic procedure.  Patient ID and intended procedure confirmed with present staff. Received instructions for my participation in the procedure from the performing physician.

## 2023-12-14 NOTE — Progress Notes (Signed)
 Expand All Collapse All        08/17/2023 Brenda Cruz [id number] September 18, 1966   Referring provider: Bennie Pierini, * Primary GI doctor: Dr. Marina Goodell   ASSESSMENT AND PLAN:    Chronic Diarrhea since at least 2012 or prior Daily watery, yellow stools for the past 5 years, up to 5 times daily. Associated with multiple mineral deficiencies. Possible bile acid diarrhea due to history of cholecystectomy. -Order endoscopy and colonoscopy to further evaluate with Dr. Marina Goodell at [facility] I recommend upper gastrointestinal and colorectal evaluation with an EGD and colonoscopy.  Risk of bowel prep, conscious sedation, and EGD and colonoscopy were discussed.  Risks include but are not limited to dehydration, pain, bleeding, cardiopulmonary process, bowel perforation, or other possible adverse outcomes..  Treatment plan was discussed with patient, and agreed upon. -Start Colestipol, begin with half a pill once daily and adjust as needed. -Please check with primary care if metformin is immediate release or extended release, make sure its extended release -Given information about magnesium and decreasing diarrhea with magnesium   Gastroesophageal Reflux Disease (GERD) with nausea and atypical chest pain  atypical chest pain after eating no shortness of breath or diaphoresis or any issues with exertion. On Nexium daily, continue add on Pepcid Stop smoking Diet given Schedule for endoscopy with Dr. Marina Goodell at [facility]   Hypertension Currently on Amlodipine and Lopressor, with plans to start a combination medication due to elevated blood pressure. -Continue current medications and follow up with primary care provider regarding new medication.   Ventral Hernia Recurrent hernia with associated pain and history of previous hernia repairs. -Consider surgical repair if symptoms persist or worsen. Given information about hernias  Discussed ER precautions Weight loss advised.    General Health  Maintenance -Continue Metformin 1000mg  twice daily for diabetes management. -Continue Zofran as needed for nausea. -Continue Oxycodone as needed for back pain. -Advise cessation of smoking for overall health improvement. -Order labs for hormone levels and liver function.             Patient Care Team: Bennie Pierini, [name] as PCP - General (Family Medicine) Runell Gess, MD as PCP - Cardiology (Cardiology) Barrett, Joline Salt, PA-C as Physician Assistant (Cardiology) Delora Fuel, OD (Optometry)   HISTORY OF PRESENT ILLNESS: 58 y.o. female with a past medical history of degenerative disc disease, obesity, anxiety, asthma, GERD, COPD, status post cholecystectomy, breast cancer s/p treatment and others listed below presents for evaluation of chronic diarrhea.    03/07/2011 MRI abdomen with without contrast unremarkable 07/12/2011 office visit with Dr. Marina Goodell for chronic abdominal discomfort.  Negative TTG.  Patient was set up for colonoscopy and endoscopy of this was not done.   11/27/2022 CT abdomen pelvis with without contrast for abdominal wall hernia suspected generalized abdominal pain showed small fat-containing midline ventral abdominal wall hernia just.  To the umbilicus otherwise unremarkable. 07/05/2023 abdominal ultrasound for abdominal pain hepatic steatosis with spleen upper limits of normal, status post cholecystectomy, normal bile ducts 07/05/2023 labs reviewed showed LFTs elevated AST 63, ALT 47 normal alk phos and total bilirubin, BUN 9, creatinine elevated 1.27, elevated glucose, negative lipase, WBC slightly elevated at 10.9 no anemia.   Discussed the use of AI scribe software for clinical note transcription with the patient, who gave verbal consent to proceed.   The patient, with a history of breast cancer, hypertension, and a recent hernia repair, presents with chronic diarrhea since at least 2012, worse last five years ago. The diarrhea is described  as a daily  occurrence, often watery and yellow in color, and is associated with urgency. The patient reports that the diarrhea often occurs after eating and has led to episodes of incontinence. The patient also reports a history of magnesium and vitamin D deficiency, requiring high-dose supplementation. She is currently on metformin 1000mg  twice daily, which she suspects may be contributing to her symptoms. In addition to the diarrhea, the patient has been experiencing abdominal pain related to a hernia that was not repaired during a recent surgery. The hernia is described as painful and occasionally requires manual reduction.   The patient also reports occasional chest pressure, sometimes after eating, but denies associated shortness of breath or need for nitroglycerin. She has a history of open-heart surgery and is currently on amlodipine for hypertension, with plans to start a combination therapy due to persistently high blood pressure.   The patient also mentions experiencing reflux, managed with daily Nexium, and occasional difficulty swallowing. She reports nausea, managed with Zofran, and is on oxycodone for chronic back pain. The patient has noticed significant hair loss over the past month and has a family history of autoimmune conditions. She is a current smoker. Despite these health issues, the patient denies any significant weight loss.  She  reports that she has been smoking cigarettes. She has never used smokeless tobacco. She reports that she does not drink alcohol and does not use drugs.   RELEVANT LABS AND IMAGING:   Results   LABS Magnesium: 1.5-1.6 mg/dL Vitamin D: deficient Liver function tests: elevated   RADIOLOGY CT abdomen: three hernias, fatty liver (11/2022)       CBC Labs (Brief)          Component Value Date/Time    WBC 10.9 (H) 07/04/2023 1618    WBC 6.3 03/09/2023 2113    RBC 4.48 07/04/2023 1618    RBC 4.11 03/09/2023 2113    HGB 13.5 07/04/2023 1618    HGB 12.4  12/07/2015 1029    HCT 41.5 07/04/2023 1618    HCT 38.2 12/07/2015 1029    PLT 272 07/04/2023 1618    MCV 93 07/04/2023 1618    MCV 87.2 12/07/2015 1029    MCH 30.1 07/04/2023 1618    MCH 30.2 03/09/2023 2113    MCHC 32.5 07/04/2023 1618    MCHC 33.2 03/09/2023 2113    RDW 12.2 07/04/2023 1618    RDW 14.8 (H) 12/07/2015 1029    LYMPHSABS 3.1 07/04/2023 1618    LYMPHSABS 2.4 12/07/2015 1029    MONOABS 0.7 03/09/2023 2113    MONOABS 0.9 12/07/2015 1029    EOSABS 0.2 07/04/2023 1618    BASOSABS 0.1 07/04/2023 1618    BASOSABS 0.1 12/07/2015 1029      Recent Labs (within last 365 days)         Recent Labs    08/29/22 1038 11/27/22 1553 11/29/22 0918 03/09/23 2113 07/04/23 1618  HGB 13.6 12.9 13.6 12.4 13.5        CMP     Labs (Brief)          Component Value Date/Time    NA 139 07/04/2023 1618    K 4.5 07/04/2023 1618    CL 98 07/04/2023 1618    CO2 21 07/04/2023 1618    GLUCOSE 172 (H) 07/04/2023 1618    GLUCOSE 176 (H) 03/09/2023 2113    BUN 9 07/04/2023 1618    CREATININE 1.27 (H) 07/04/2023 1618    CALCIUM 9.4 07/04/2023 1618  PROT 7.3 07/04/2023 1618    ALBUMIN 4.5 07/04/2023 1618    AST 63 (H) 07/04/2023 1618    ALT 47 (H) 07/04/2023 1618    ALKPHOS 97 07/04/2023 1618    BILITOT 0.3 07/04/2023 1618    GFRNONAA 51 (L) 03/09/2023 2113    GFRAA 67 04/01/2020 0932          Latest Ref Rng & Units 07/04/2023    4:18 PM 11/29/2022    9:18 AM 11/27/2022    3:53 PM  Hepatic Function  Total Protein 6.0 - 8.5 g/dL 7.3  7.0  6.7   Albumin 3.8 - 4.9 g/dL 4.5  4.3  3.4   AST 0 - 40 IU/L 63  48  36   ALT 0 - 32 IU/L 47  46  31   Alk Phosphatase 44 - 121 IU/L 97  111  72   Total Bilirubin 0.0 - 1.2 mg/dL 0.3  0.3  0.5       Current Medications:    Current Outpatient Medications (Endocrine & Metabolic):    metFORMIN (GLUCOPHAGE) 1000 MG tablet, Take 1 tablet (1,000 mg total) by mouth 2 (two) times daily.   Current Outpatient Medications (Cardiovascular):     amLODipine-olmesartan (AZOR) 5-20 MG tablet, Take 1 tablet by mouth daily.   colestipol (COLESTID) 1 g tablet, Take 2 tablets (2 g total) by mouth 2 (two) times daily.   Evolocumab (REPATHA SURECLICK) 140 MG/ML SOAJ, Inject 140 mg into the skin every 14 (fourteen) days.   metoprolol tartrate (LOPRESSOR) 50 MG tablet, Take 1 tablet (50 mg total) by mouth 2 (two) times daily.   nitroGLYCERIN (NITROSTAT) 0.4 MG SL tablet, Place 1 tablet (0.4 mg total) under the tongue every 5 (five) minutes as needed for chest pain.   Current Outpatient Medications (Respiratory):    albuterol (VENTOLIN HFA) 108 (90 Base) MCG/ACT inhaler, Inhale 2 puffs into the lungs every 4 (four) hours as needed for wheezing or shortness of breath.   Budeson-Glycopyrrol-Formoterol (BREZTRI AEROSPHERE) 160-9-4.8 MCG/ACT AERO, Inhale into the lungs.   cetirizine (ZYRTEC ALLERGY) 10 MG tablet, Take 1 tablet (10 mg total) by mouth daily.   diphenhydrAMINE (BENADRYL) 25 MG tablet, Take 25 mg by mouth every 6 (six) hours as needed for itching or allergies.   fluticasone (FLONASE) 50 MCG/ACT nasal spray, Place 2 sprays into both nostrils daily. (Patient taking differently: Place 2 sprays into both nostrils daily as needed for allergies.)   ipratropium-albuterol (DUONEB) 0.5-2.5 (3) MG/3ML SOLN, Take 3 mLs by nebulization every 6 (six) hours as needed (Foir shortness of breath).   Current Outpatient Medications (Analgesics):    aspirin EC 81 MG tablet, Take 1 tablet (81 mg total) by mouth every evening.   ibuprofen (ADVIL) 200 MG tablet, Take 400 mg by mouth every 6 (six) hours as needed for moderate pain.   oxyCODONE (ROXICODONE) 5 MG immediate release tablet, Take 1 tablet (5 mg total) by mouth every 6 (six) hours as needed.     Current Outpatient Medications (Other):    blood glucose meter kit and supplies, Dispense based on patient and insurance preference. Use up to four times daily as directed. (FOR ICD-10 E10.9, E11.9).    Calcium Carb-Cholecalciferol (CALCIUM 600 + D PO), Take 1 tablet by mouth daily.   clobetasol cream (TEMOVATE) 0.05 %, Apply 1 Application topically 2 (two) times daily.   cyclobenzaprine (FLEXERIL) 10 MG tablet, TAKE ONE TABLET BY MOUTH THREE TIMES DAILY AS NEEDED FOR MUSCLE SPASM  escitalopram (LEXAPRO) 10 MG tablet, Take 1 tablet (10 mg total) by mouth daily.   esomeprazole (NEXIUM) 40 MG capsule, Take 1 capsule (40 mg total) by mouth daily.   famotidine (PEPCID) 40 MG tablet, Take 1 tablet (40 mg total) by mouth at bedtime.   LORazepam (ATIVAN) 0.5 MG tablet, Take 1 tablet (0.5 mg total) by mouth every 8 (eight) hours as needed for anxiety.   magnesium oxide (MAG-OX) 400 MG tablet, Take 1 tablet (400 mg total) by mouth daily. (Patient taking differently: Take 800 mg by mouth 2 (two) times daily.)   MELATONIN PO, Take 1 tablet by mouth at bedtime as needed (sleep).   ondansetron (ZOFRAN-ODT) 8 MG disintegrating tablet, Take 1 tablet (8 mg total) by mouth every 6 (six) hours as needed for nausea or vomiting.   Vitamin D, Ergocalciferol, (DRISDOL) 1.25 MG (50000 UNIT) CAPS capsule, Take 1 capsule (50,000 Units total) by mouth every 7 (seven) days.   Medical History:      Past Medical History:  Diagnosis Date   Anxiety     Asthma     COPD (chronic obstructive pulmonary disease) (HCC)     Coronary artery disease     DDD (degenerative disc disease)     GERD (gastroesophageal reflux disease)     Hemorrhoids     HIT (heparin-induced thrombocytopenia) (HCC)     Hyperlipemia     Hypertension     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) 12/15/2017   Iron deficiency anemia 11/23/2015   Myocardial infarction [facility]) 2019    widow maker   PONV (postoperative nausea and vomiting)     Pulmonary emboli (HCC) 12/07/2015   [facility] spotted fever     TMJ (temporomandibular joint disorder)     Type 2 diabetes mellitus (HCC) 12/15/2017        Allergies:  Allergies        Allergies  Allergen Reactions   Atorvastatin Other (See Comments)      Myalgia     Iohexol Itching, Rash and Other (See Comments)      Desc: White blisters in mouth during ivp in [place] '93, ok w/ 13 hour prep today//a.calhoun, Onset Date: 10/30/1991     Ace Inhibitors Cough   Farxiga [Dapagliflozin]        UTI, heart palpitations   Heparin Other (See Comments)      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   Sulfa Antibiotics Itching, Rash and Other (See Comments)        Surgical History:  She  has a past surgical history that includes Cholecystectomy; Lumbar fusion; Cardiac catheterization (N/A, 10/29/2015); Coronary artery bypass graft (N/A, 11/02/2015); TEE without cardioversion (N/A, 11/02/2015); Left oophorectomy (Left); and XI robotic assisted ventral hernia (N/A, 01/11/2023). Family History:  Her family history includes Breast cancer in her mother; Colon polyps in her father; Esophageal cancer in her maternal grandfather; Ovarian cancer in an other family member; Stomach cancer in her paternal grandmother.  REVIEW OF SYSTEMS  : All other systems reviewed and negative except where noted in the History of Present Illness.   PHYSICAL EXAM: BP (!) 160/88   Pulse 75   Wt 211 lb (95.7 kg)   LMP 06/03/2017 (Exact Date)   BMI 32.08 kg/m  General Appearance: Well nourished, in no apparent distress. Head:   Normocephalic and atraumatic. Eyes:  sclerae  anicteric,conjunctive pink  Respiratory: Respiratory effort normal, BS equal bilaterally without rales, rhonchi, wheezing. Cardio: RRR with no MRGs. Peripheral pulses intact.  Abdomen: Soft,  Obese ,active bowel sounds. moderate tenderness in the epigastrium. Without guarding and Without rebound. No masses. Rectal: Not evaluated Musculoskeletal: Full ROM, Normal gait. Without edema. Skin:  Dry and intact without significant lesions or rashes Neuro: Alert and  oriented x4;  No focal deficits. Psych:  Cooperative. Normal mood and affect.      Doree Albee, PA-C 3:01 PM         Previous H&P as above.  Now for colonoscopy and upper endoscopy with biopsies.

## 2023-12-15 ENCOUNTER — Telehealth: Payer: Self-pay | Admitting: *Deleted

## 2023-12-15 NOTE — Telephone Encounter (Signed)
 No answer for post procedure call back. Left VM.

## 2023-12-18 LAB — SURGICAL PATHOLOGY

## 2023-12-21 ENCOUNTER — Ambulatory Visit: Admitting: Family Medicine

## 2023-12-21 ENCOUNTER — Other Ambulatory Visit: Payer: Self-pay | Admitting: Nurse Practitioner

## 2023-12-21 VITALS — BP 176/101 | HR 74 | Temp 98.0°F | Ht 68.0 in | Wt 209.0 lb

## 2023-12-21 DIAGNOSIS — F411 Generalized anxiety disorder: Secondary | ICD-10-CM

## 2023-12-21 DIAGNOSIS — E1165 Type 2 diabetes mellitus with hyperglycemia: Secondary | ICD-10-CM

## 2023-12-21 DIAGNOSIS — I152 Hypertension secondary to endocrine disorders: Secondary | ICD-10-CM

## 2023-12-21 DIAGNOSIS — E1159 Type 2 diabetes mellitus with other circulatory complications: Secondary | ICD-10-CM

## 2023-12-21 DIAGNOSIS — I10 Essential (primary) hypertension: Secondary | ICD-10-CM

## 2023-12-21 DIAGNOSIS — G44209 Tension-type headache, unspecified, not intractable: Secondary | ICD-10-CM

## 2023-12-21 DIAGNOSIS — J0110 Acute frontal sinusitis, unspecified: Secondary | ICD-10-CM | POA: Diagnosis not present

## 2023-12-21 DIAGNOSIS — Z7984 Long term (current) use of oral hypoglycemic drugs: Secondary | ICD-10-CM

## 2023-12-21 MED ORDER — AZITHROMYCIN 250 MG PO TABS: ORAL_TABLET | ORAL | 0 refills | Status: AC

## 2023-12-21 NOTE — Progress Notes (Signed)
 Subjective:  Patient ID: Brenda Cruz, female    DOB: November 30, 1965, 58 y.o.   MRN: [id number]  Patient Care Team: Bennie Pierini, [name] as PCP - General (Family Medicine) Runell Gess, MD as PCP - Cardiology (Cardiology) Barrett, Shawn Stall as Physician Assistant (Cardiology) Delora Fuel, OD (Optometry) [facility]   Chief Complaint:  Headache (Nausea/sinus pain/Facial pain/)  HPI: Brenda Cruz is a 58 y.o. female presenting on 12/21/2023 for Headache (Nausea/sinus pain/Facial pain/)  States that headache started 3 days ago. She is monitoring her BP at home. She did not take her BP medication this morning. She states that this past weekend she started with rhinorrhea, cough, sinus pressure, and ear pain. She is taking mucinex, using vicks, flonase, alka seltzer cold, claritin. States that she was able to clear some, but continues to have symptoms. States that this happens twice per year for her. Reports nausea and photophobia as well, which is new for her.    Relevant past medical, surgical, family, and social history reviewed and updated as indicated.  Allergies and medications reviewed and updated. Data reviewed: Chart in Epic.   Past Medical History:  Diagnosis Date   Anxiety    Asthma    Clotting disorder (HCC)    COPD (chronic obstructive pulmonary disease) (HCC)    Coronary artery disease    DDD (degenerative disc disease)    GERD (gastroesophageal reflux disease)    Hemorrhoids    HIT (heparin-induced thrombocytopenia) (HCC)    Hyperlipemia    Hypertension    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) 12/15/2017   Iron deficiency anemia 11/23/2015   Myocardial infarction [facility]) 2019   widow maker   PONV (postoperative nausea and vomiting)    Pulmonary emboli (HCC) 12/07/2015   [facility] spotted fever    Sleep apnea    TMJ (temporomandibular joint disorder)    Type 2 diabetes mellitus (HCC) 12/15/2017    Past Surgical  History:  Procedure Laterality Date   CARDIAC CATHETERIZATION N/A 10/29/2015   Procedure: Left Heart Cath and Coronary Angiography;  Surgeon: Runell Gess, MD;  Location: [facility] INVASIVE CV LAB;  Service: Cardiovascular;  Laterality: N/A;   CHOLECYSTECTOMY     CORONARY ARTERY BYPASS GRAFT N/A 11/02/2015   Procedure: CORONARY ARTERY BYPASS GRAFTING (CABG)x 1 using left internal mammary artery.;  Surgeon: Loreli Slot, MD;  Location: [facility] OR;  Service: Open Heart Surgery;  Laterality: N/A;   LEFT OOPHORECTOMY Left    Related to tubal torsion.  Bilateral salpingectomy also.  Remaining uterus and right ovary   LUMBAR FUSION     TEE WITHOUT CARDIOVERSION N/A 11/02/2015   Procedure: TRANSESOPHAGEAL ECHOCARDIOGRAM (TEE);  Surgeon: Loreli Slot, MD;  Location: [facility] OR;  Service: Open Heart Surgery;  Laterality: N/A;   XI ROBOTIC ASSISTED VENTRAL HERNIA N/A 01/11/2023   Procedure: XI ROBOTIC ASSISTED VENTRAL HERNIA REPAIR W/ MESH- SUPRAUMBILICAL;  Surgeon: Lewie Chamber, DO;  Location: [facility] ORS;  Service: General;  Laterality: N/A;   Social History   Socioeconomic History   Marital status: Married    Spouse name: Not on file   Number of children: 1   Years of education: Not on file   Highest education level: Associate degree: occupational, Scientist, product/process development, or vocational program  Occupational History   Occupation: Designer, jewellery  Tobacco Use   Smoking status: Every Day    Current packs/day: 1.00    Types: Cigarettes   Smokeless tobacco: Never  Tobacco comments:    Patient has been smoking 38years//PAP 8/12  Vaping Use   Vaping status: Never Used  Substance and Sexual Activity   Alcohol use: No    Alcohol/week: 0.0 standard drinks of alcohol   Drug use: No   Sexual activity: Not on file  Other Topics Concern   Not on file  Social History Narrative   2 caffeine drinks daily    Social Drivers of Health   Financial Resource Strain: Low Risk  (12/21/2023)   Overall Financial  Resource Strain (CARDIA)    Difficulty of Paying Living Expenses: Not hard at all  Food Insecurity: No Food Insecurity (12/21/2023)   Hunger Vital Sign    Worried About Running Out of Food in the Last Year: Never true    Ran Out of Food in the Last Year: Never true  Transportation Needs: No Transportation Needs (12/21/2023)   PRAPARE - Administrator, Civil Service (Medical): No    Lack of Transportation (Non-Medical): No  Physical Activity: Insufficiently Active (12/21/2023)   Exercise Vital Sign    Days of Exercise per Week: 1 day    Minutes of Exercise per Session: 10 min  Stress: Stress Concern Present (12/21/2023)   [employer] of Occupational Health - Occupational Stress Questionnaire    Feeling of Stress : Very much  Social Connections: Moderately Integrated (12/21/2023)   Social Connection and Isolation Panel [NHANES]    Frequency of Communication with Friends and Family: Twice a week    Frequency of Social Gatherings with Friends and Family: Once a week    Attends Religious Services: 1 to 4 times per year    Active Member of [employer] or Organizations: No    Attends Banker Meetings: Not on file    Marital Status: Married  Intimate Partner Violence: Not on file    Outpatient Encounter Medications as of 12/21/2023  Medication Sig   albuterol (VENTOLIN HFA) 108 (90 Base) MCG/ACT inhaler Inhale 2 puffs into the lungs every 4 (four) hours as needed for wheezing or shortness of breath.   amLODipine-olmesartan (AZOR) 5-20 MG tablet Take 1 tablet by mouth daily.   blood glucose meter kit and supplies Dispense based on patient and insurance preference. Use up to four times daily as directed. (FOR ICD-10 E10.9, E11.9).   Budeson-Glycopyrrol-Formoterol (BREZTRI AEROSPHERE) 160-9-4.8 MCG/ACT AERO Inhale into the lungs.   Calcium Carb-Cholecalciferol (CALCIUM 600 + D PO) Take 1 tablet by mouth daily.   cetirizine (ZYRTEC) 10 MG tablet TAKE ONE (1) TABLET BY MOUTH EVERY  DAY   clobetasol cream (TEMOVATE) 0.05 % Apply 1 Application topically 2 (two) times daily.   cyclobenzaprine (FLEXERIL) 10 MG tablet TAKE ONE TABLET BY MOUTH THREE TIMES DAILY AS NEEDED FOR MUSCLE SPASM   esomeprazole (NEXIUM) 40 MG capsule TAKE ONE CAPSULE BY MOUTH DAILY   famotidine (PEPCID) 40 MG tablet Take 1 tablet (40 mg total) by mouth at bedtime.   fluticasone (FLONASE) 50 MCG/ACT nasal spray Place 2 sprays into both nostrils daily. (Patient taking differently: Place 2 sprays into both nostrils daily as needed for allergies.)   hydrOXYzine (ATARAX) 25 MG tablet Take 1 tablet (25 mg total) by mouth every 4 (four) hours as needed for itching.   ibuprofen (ADVIL) 200 MG tablet Take 400 mg by mouth every 6 (six) hours as needed for moderate pain.   ipratropium-albuterol (DUONEB) 0.5-2.5 (3) MG/3ML SOLN Take 3 mLs by nebulization every 6 (six) hours as needed (Foir shortness  of breath).   LORazepam (ATIVAN) 0.5 MG tablet TAKE 1 TABLET BY MOUTH EVERY 8 HOURS AS NEEDED FOR ANXIETY   magnesium oxide (MAG-OX) 400 MG tablet Take 1 tablet (400 mg total) by mouth daily. (Patient taking differently: Take 800 mg by mouth 2 (two) times daily.)   MELATONIN PO Take 1 tablet by mouth at bedtime as needed (sleep).   metFORMIN (GLUCOPHAGE) 1000 MG tablet TAKE ONE (1) TABLET BY MOUTH TWO (2) TIMES DAILY   metoprolol tartrate (LOPRESSOR) 50 MG tablet TAKE ONE (1) TABLET BY MOUTH TWO (2) TIMES DAILY   ondansetron (ZOFRAN-ODT) 8 MG disintegrating tablet Take 1 tablet (8 mg total) by mouth every 6 (six) hours as needed for nausea or vomiting.   predniSONE (DELTASONE) 50 MG tablet Take 1 tablet (50 mg total) by mouth as directed for 3 doses. Take on dates/times instructed by prescribing provider.  Take 1 prednisone tab (50 mg orally) 13, 7, and 1 hour before contrast injection. Call [phone] for questions.   Vitamin D, Ergocalciferol, (DRISDOL) 1.25 MG (50000 UNIT) CAPS capsule Take 1 capsule (50,000 Units total)  by mouth every 7 (seven) days.   diphenhydrAMINE (BENADRYL) 50 MG tablet Take 1 tablet (50 mg total) by mouth once for 1 dose. Take on date/time instructed by prescribing provider.  Take within 1 hour of contrast injection. Call [phone] for questions.   nitroGLYCERIN (NITROSTAT) 0.4 MG SL tablet Place 1 tablet (0.4 mg total) under the tongue every 5 (five) minutes as needed for chest pain.   No facility-administered encounter medications on file as of 12/21/2023.    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   Iohexol Itching, Rash and Other (See Comments)    Desc: White blisters in mouth during ivp in [place] '93, ok w/ 13 hour prep today//a.calhoun, Onset Date: 10/30/1991    Iodinated Contrast Media Itching and Rash   Penicillins Itching and Rash    Has patient had a PCN reaction causing immediate rash, facial/tongue/throat swelling, SOB or lightheadedness with hypotension: Yes Has patient had a PCN reaction causing severe rash involving mucus membranes or skin necrosis: No Has patient had a PCN reaction that required hospitalization: No Has patient had a PCN reaction occurring within the last 10 years: No If all of the above answers are "NO", then may proceed with Cephalosporin use.   Sulfa Antibiotics Itching, Rash and Other (See Comments)    Review of Systems As per HPI  Objective:  BP (!) 176/101   Pulse 74   Temp 98 F (36.7 C)   Ht 5\' 8"  (1.727 m)   Wt 209 lb (94.8 kg)   LMP 06/03/2017 (Exact Date)   SpO2 97%   BMI 31.78 kg/m    Wt Readings from Last 3 Encounters:  12/21/23 209 lb (94.8 kg)  12/14/23 212 lb (96.2 kg)  11/20/23 212 lb (96.2 kg)   Physical Exam Constitutional:      General: She is awake. She is not in acute distress.    Appearance:  Normal appearance. She is well-developed and well-groomed. She is not ill-appearing, toxic-appearing or diaphoretic.  HENT:     Right Ear: No drainage, swelling or tenderness. A middle ear effusion is present. There is no impacted cerumen. No foreign  body. No mastoid tenderness. No PE tube. No hemotympanum. Tympanic membrane is not injected, scarred, perforated, erythematous, retracted or bulging.     Left Ear: No drainage, swelling or tenderness. A middle ear effusion is present. There is no impacted cerumen. No foreign body. No mastoid tenderness. No PE tube. No hemotympanum. Tympanic membrane is not injected, scarred, perforated, erythematous, retracted or bulging.     Nose: Mucosal edema, congestion and rhinorrhea present.     Right Turbinates: Not pale.     Left Turbinates: Not pale.     Right Sinus: Maxillary sinus tenderness and frontal sinus tenderness present.     Left Sinus: Maxillary sinus tenderness and frontal sinus tenderness present.     Mouth/Throat:     Lips: Pink. No lesions.     Mouth: Mucous membranes are moist.     Palate: No mass.     Pharynx: Posterior oropharyngeal erythema present. No pharyngeal swelling, oropharyngeal exudate, uvula swelling or postnasal drip.     Tonsils: No tonsillar exudate or tonsillar abscesses.  Cardiovascular:     Rate and Rhythm: Normal rate and regular rhythm.     Pulses: Normal pulses.          Radial pulses are 2+ on the right side and 2+ on the left side.       Posterior tibial pulses are 2+ on the right side and 2+ on the left side.     Heart sounds: Normal heart sounds. No murmur heard.    No gallop.  Pulmonary:     Effort: Pulmonary effort is normal. No respiratory distress.     Breath sounds: Normal breath sounds. No stridor. No wheezing, rhonchi or rales.  Musculoskeletal:     Cervical back: Full passive range of motion without pain and neck supple.     Right lower leg: No edema.     Left lower leg: No edema.  Lymphadenopathy:      Head:     Right side of head: Tonsillar adenopathy present. No submental, submandibular, preauricular or posterior auricular adenopathy.     Left side of head: No submental, submandibular, tonsillar, preauricular or posterior auricular adenopathy.     Cervical:     Right cervical: No superficial cervical adenopathy.    Left cervical: No superficial cervical adenopathy.  Skin:    General: Skin is warm.     Capillary Refill: Capillary refill takes less than 2 seconds.  Neurological:     General: No focal deficit present.     Mental Status: She is alert, oriented to person, place, and time and easily aroused. Mental status is at baseline.     GCS: GCS eye subscore is 4. GCS verbal subscore is 5. GCS motor subscore is 6.     Motor: No weakness.  Psychiatric:        Attention and Perception: Attention and perception normal.        Mood and Affect: Mood and affect normal.        Speech: Speech normal.        Behavior: Behavior normal. Behavior is cooperative.        Thought Content: Thought content normal. Thought content does not include homicidal or suicidal ideation. Thought content does not include homicidal or suicidal plan.        Cognition and Memory: Cognition and memory normal.        Judgment: Judgment normal.      Results for orders placed or performed in visit on 12/14/23  Surgical pathology ([facility])   Collection Time: 12/14/23 12:00 AM  Result Value Ref Range   SURGICAL PATHOLOGY      SURGICAL PATHOLOGY [facility] [address], [address] [place], [place] [address] Telephone [phone] or [phone] Fax [phone]  REPORT OF SURGICAL PATHOLOGY   Accession #: [id number] Patient Name: MANREET, KIERNAN Visit # : [id number]  MRN: [id number] Physician: Yancey Flemings DOB/Age August 08, 1966 (Age: 19) Gender: F Collected Date: 12/14/2023 Received Date: 12/15/2023  FINAL DIAGNOSIS       1. Surgical [P], colon, ascending, polyp (2) :        FRAGMENTS OF TUBULAR ADENOMA.      NEGATIVE FOR HIGH-GRADE DYSPLASIA.       2. Surgical [P], colon nos, random sites :       COLONIC MUCOSA WITH NO SIGNIFICANT DIAGNOSTIC ALTERATION.      NO EVIDENCE OF LYMPHOCYTIC COLITIS OR COLLAGENOUS COLITIS.      NEGATIVE FOR ACTIVITY, CHRONICITY, GRANULOMA, DYSPLASIA OR MALIGNANCY.       3. Surgical [P], colon, descending, polyp (1) :       TUBULAR ADENOMA.      NEGATIVE FOR HIGH-GRADE DYSPLASIA.       4. Surgical [P], duodenal biopsies :       DU ODENAL MUCOSA WITH FOVEOLAR METAPLASIA AND BRUNNER GLAND HYPERPLASIA      CONSISTENT WITH CHRONIC PEPTIC DUODENITIS.      NEGATIVE FOR DYSPLASIA.       ELECTRONIC SIGNATURE : Lance Coon Md, Pathologist, Electronic Signature  MICROSCOPIC DESCRIPTION  CASE COMMENTS STAINS USED IN DIAGNOSIS: H&E H&E-2 H&E H&E H&E    CLINICAL HISTORY  SPECIMEN(S) OBTAINED 1. Surgical [P], Colon, Ascending, Polyp (2) 2. Surgical [P], Colon Nos, Random Sites 3. Surgical [P], Colon, Descending, Polyp (1) 4. Surgical [P], Duodenal Biopsies  SPECIMEN COMMENTS: 1. Special screening for malignant neoplasms, colon; chronic diarrhea; gastroesophageal reflux disease, unspecified whether esophagitis present; colon cancer screening; benign neoplasm of ascending colon; benign neoplasm of descending colon SPECIMEN CLINICAL INFORMATION: 1. R/O adenoma 2. R/O microscopic colitis 3. R/O adenoma 4. R/O celiac sprue    Gross Description 1. Received in formalin are tan, soft tissue  fragments that are submitted in toto.Number: multiple, Size: 0.2 cm smallest to 0.4 cm largest, (1B) ( TA ) 2. Received in formalin are tan, soft tissue fragments that are submitted in toto.Number: 4, Size: 0.2 cm smallest to 0.3 cm largest, (1B) ( TA ) 3. Received in formalin are tan, soft tissue fragments that are submitted in toto.Number: 1 Size: 0.8 cm, (1B) ( TA ) 4. Received in formalin are tan, soft tissue fragments that  are submitted in toto.Number: 4, Size: 0.2 cm smallest to 0.3 cm largest, (1B) ( TA )        Report signed out from the following location(s) [place] [address], [place] [address] CLIA #: [id number]  [facility] [address] [place], [place] [address] CLIA #: [id number]        08/14/2023   12:14 PM 08/03/2023    2:43 PM 07/04/2023    3:30 PM 03/09/2023   12:03 PM 11/29/2022    9:21 AM  Depression screen PHQ 2/9  Decreased Interest 1 1 0 0 1  Down, Depressed, Hopeless 0 1 0 0 1  PHQ - 2 Score 1 2 0 0 2  Altered sleeping 3 1  1 2   Tired, decreased energy 3 3  1 2  Change in appetite 0 0  0 1  Feeling bad or failure about yourself  0 0  0 0  Trouble concentrating 1 1  0 1  Moving slowly or fidgety/restless 0 0  0 0  Suicidal thoughts 0 0  0 0  PHQ-9 Score 8 7  2 8   Difficult doing work/chores Not difficult at all Not difficult at all  Not difficult at all Somewhat difficult       08/14/2023   12:15 PM 08/03/2023    2:44 PM 03/09/2023   12:03 PM 11/29/2022    9:21 AM  GAD 7 : Generalized Anxiety Score  Nervous, Anxious, on Edge 1 2 1 1   Control/stop worrying 1 0 1 1  Worry too much - different things 0 0 0 1  Trouble relaxing 1 3 0 1  Restless 0 0 0 0  Easily annoyed or irritable 0 0 0 1  Afraid - awful might happen 0 0 0 1  Total GAD 7 Score 3 5 2 6   Anxiety Difficulty Somewhat difficult Somewhat difficult Not difficult at all Somewhat difficult      Pertinent labs & imaging results that were available during my care of the patient were reviewed by me and considered in my medical decision making.  Assessment & Plan:  Mignonne Afonso" was seen today for headache.  Diagnoses and all orders for this visit:  1. Acute non intractable tension-type headache (Primary) Provided patient with nurtec sample in office. Unable to provide toradol or prednisone injection for symptom management due to patient history of  cardiac disease and uncontrolled hypertension. Strongly recommended that patient follow up as soon as possible for BP and Diabetes management.   2. Subacute frontal sinusitis Discussed with patient that likely viral in etiology. Discussed that symptoms can last for up to 2 weeks. Encouraged patient to follow up if they have worsening symptoms or signs of double worsening. Discussed at home care such as humidifier, saline spray, throat lozenges, increasing hydration, and tylenol for pain or fever. Provided pocket prescription and discussed symptoms to monitor for indications to start antibiotics.  - azithromycin (ZITHROMAX) 250 MG tablet; Take 2 tablets on day 1, then 1 tablet daily on days 2 through 5  Dispense: 6 tablet; Refill: 0  3. Type 2 diabetes mellitus with hyperglycemia, without long-term current use of insulin (HCC) Uncontrolled diabetes from chart review. Strongly recommend that pt follow up with PCP for further management. Did not provide prednisone as above due to A1C.   4. Hypertension associated with diabetes (HCC) Elevated BP in office. Patient to take medications as soon as she gets home and call with BP measurement. Discussed red flag symptoms and when to follow up or seek emergency care.    Continue all other maintenance medications.  Follow up plan: Return if symptoms worsen or fail to improve, for needs follow up with PCP for BP and A1C .   Continue healthy lifestyle choices, including diet (rich in fruits, vegetables, and lean proteins, and low in salt and simple carbohydrates) and exercise (at least 30 minutes of moderate physical activity daily).  Written and verbal instructions provided   The above assessment and management plan was discussed with the patient. The patient verbalized understanding of and has agreed to the management plan. Patient is aware to call the clinic if they develop any new symptoms or if symptoms persist or worsen. Patient is aware when to  return to the clinic for a follow-up  visit. Patient educated on when it is appropriate to go to the emergency department.   Neale Burly, DNP-[name] [facility] [address] [place], [place] [address] [phone]

## 2023-12-21 NOTE — Patient Instructions (Signed)

## 2023-12-22 ENCOUNTER — Encounter: Payer: Self-pay | Admitting: Internal Medicine

## 2023-12-28 ENCOUNTER — Encounter: Payer: Self-pay | Admitting: Nurse Practitioner

## 2023-12-28 ENCOUNTER — Ambulatory Visit: Payer: Self-pay | Admitting: Nurse Practitioner

## 2023-12-28 VITALS — BP 155/84 | HR 76 | Temp 97.3°F | Ht 68.0 in | Wt 206.0 lb

## 2023-12-28 DIAGNOSIS — E782 Mixed hyperlipidemia: Secondary | ICD-10-CM

## 2023-12-28 DIAGNOSIS — K219 Gastro-esophageal reflux disease without esophagitis: Secondary | ICD-10-CM

## 2023-12-28 DIAGNOSIS — F411 Generalized anxiety disorder: Secondary | ICD-10-CM

## 2023-12-28 DIAGNOSIS — J411 Mucopurulent chronic bronchitis: Secondary | ICD-10-CM

## 2023-12-28 DIAGNOSIS — E1165 Type 2 diabetes mellitus with hyperglycemia: Secondary | ICD-10-CM

## 2023-12-28 DIAGNOSIS — D508 Other iron deficiency anemias: Secondary | ICD-10-CM

## 2023-12-28 DIAGNOSIS — I10 Essential (primary) hypertension: Secondary | ICD-10-CM

## 2023-12-28 DIAGNOSIS — E669 Obesity, unspecified: Secondary | ICD-10-CM

## 2023-12-28 DIAGNOSIS — Z720 Tobacco use: Secondary | ICD-10-CM

## 2023-12-28 DIAGNOSIS — C50912 Malignant neoplasm of unspecified site of left female breast: Secondary | ICD-10-CM

## 2023-12-28 DIAGNOSIS — I25119 Atherosclerotic heart disease of native coronary artery with unspecified angina pectoris: Secondary | ICD-10-CM

## 2023-12-28 DIAGNOSIS — N393 Stress incontinence (female) (male): Secondary | ICD-10-CM

## 2023-12-28 DIAGNOSIS — F3341 Major depressive disorder, recurrent, in partial remission: Secondary | ICD-10-CM

## 2023-12-28 DIAGNOSIS — E1159 Type 2 diabetes mellitus with other circulatory complications: Secondary | ICD-10-CM

## 2023-12-28 LAB — BAYER DCA HB A1C WAIVED: HB A1C (BAYER DCA - WAIVED): 8.8 % — ABNORMAL HIGH (ref 4.8–5.6)

## 2023-12-28 LAB — LIPID PANEL

## 2023-12-28 MED ORDER — LORAZEPAM 0.5 MG PO TABS: 0.5000 mg | ORAL_TABLET | Freq: Three times a day (TID) | ORAL | 1 refills | Status: DC | PRN

## 2023-12-28 MED ORDER — METOPROLOL TARTRATE 50 MG PO TABS: 50.0000 mg | ORAL_TABLET | Freq: Two times a day (BID) | ORAL | 5 refills | Status: DC

## 2023-12-28 MED ORDER — SEMAGLUTIDE(0.25 OR 0.5MG/DOS) 2 MG/3ML ~~LOC~~ SOPN: 0.2500 mg | PEN_INJECTOR | SUBCUTANEOUS | 2 refills | Status: DC

## 2023-12-28 MED ORDER — ESOMEPRAZOLE MAGNESIUM 40 MG PO CPDR: 40.0000 mg | DELAYED_RELEASE_CAPSULE | Freq: Every day | ORAL | 1 refills | Status: DC

## 2023-12-28 MED ORDER — SEMAGLUTIDE(0.25 OR 0.5MG/DOS) 2 MG/3ML ~~LOC~~ SOPN: 0.2500 mL | PEN_INJECTOR | SUBCUTANEOUS | 2 refills | Status: DC

## 2023-12-28 MED ORDER — AMLODIPINE-OLMESARTAN 5-20 MG PO TABS: 1.0000 | ORAL_TABLET | Freq: Every day | ORAL | 0 refills | Status: DC

## 2023-12-28 MED ORDER — MAGNESIUM OXIDE 400 MG PO TABS: 800.0000 mg | ORAL_TABLET | Freq: Two times a day (BID) | ORAL | 1 refills | Status: DC

## 2023-12-28 NOTE — Addendum Note (Signed)
 Addended by: Bennie Pierini on: 12/28/2023 09:24 AM   Modules accepted: Orders

## 2023-12-28 NOTE — Patient Instructions (Signed)

## 2023-12-28 NOTE — Progress Notes (Signed)
 Subjective:    Patient ID: Brenda Cruz, female    DOB: [birth year], 58 y.o.   MRN: [id number]   Chief Complaint: medical management of chronic issues     HPI:  Brenda Cruz is a 58 y.o. who identifies as a female who was assigned female at birth.   Social history: Lives with: husband and son Work history: changing jobs   Comes in today for follow up of the following chronic medical issues:  1. Essential hypertension No c/o chest pain of headaches. Has had lots of SOB lately but has been really sick wit upper respiratory infection. She is not taking her blood pressure meds like she is suppose to. BP Readings from Last 3 Encounters:  12/21/23 (!) 176/101  12/14/23 (!) 170/88  11/07/23 (!) 166/78     2. Coronary artery disease involving native coronary artery of native heart with angina pectoris [facility]) Patient has not seen her cardiologist in over a year.  3. Mucopurulent chronic bronchitis (HCC) Has COPD from smoking  4. Gastroesophageal reflux disease without esophagitis Is on nexium daily. Usually helps with any symptoms  5. Type 2 diabetes mellitus with hyperglycemia, without long-term current use of insulin (HCC) She does not check her blood suars everyday. And since he has been sick she has not been checking them, no medication changes were made at last visit. She is on metformin which is causing severe diarrhea.  Lab Results  Component Value Date   HGBA1C 9.0 (H) 11/29/2022      6. Mixed hyperlipidemia Does not watch diet and does no exercise at all. Lab Results  Component Value Date   CHOL 294 (H) 11/29/2022   HDL 37 (L) 11/29/2022   LDLCALC 179 (H) 11/29/2022   TRIG 394 (H) 11/29/2022   CHOLHDL 7.9 (H) 11/29/2022     7. Stress incontinence of urine No better. Every time she coughs she wets  in her pants  8. Recurrent major depressive disorder, in partial remission [facility]) Patient has been depressed for many years. Hermom died about 6 miomths  ago and that increased herdepression. She is still on prozac.    12/21/2023    3:44 PM 08/14/2023   12:14 PM 08/03/2023    2:43 PM  Depression screen PHQ 2/9  Decreased Interest 0 1 1  Down, Depressed, Hopeless 1 0 1  PHQ - 2 Score 1 1 2   Altered sleeping 2 3 1   Tired, decreased energy 1 3 3   Change in appetite 0 0 0  Feeling bad or failure about yourself  0 0 0  Trouble concentrating 0 1 1  Moving slowly or fidgety/restless 0 0 0  Suicidal thoughts 0 0 0  PHQ-9 Score 4 8 7   Difficult doing work/chores Not difficult at all Not difficult at all Not difficult at all     9. GAD (generalized anxiety disorder) Takes ativan 3x a day.    12/21/2023    3:44 PM 08/14/2023   12:15 PM 08/03/2023    2:44 PM 03/09/2023   12:03 PM  GAD 7 : Generalized Anxiety Score  Nervous, Anxious, on Edge 1 1 2 1   Control/stop worrying 1 1 0 1  Worry too much - different things 0 0 0 0  Trouble relaxing 2 1 3  0  Restless 0 0 0 0  Easily annoyed or irritable 0 0 0 0  Afraid - awful might happen 0 0 0 0  Total GAD 7 Score 4 3 5  2  Anxiety Difficulty Somewhat difficult Somewhat difficult Somewhat difficult Not difficult at all       10. Iron deficiency anemia secondary to inadequate dietary iron intake Is very fatigued Lab Results  Component Value Date   HGB 12.7 08/17/2023     11. 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) Had bil breast US on 09/01/22 and was clear.  12. Tobacco abuse Still smokes over a pack a day.   13. 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   Iohexol Itching, Rash and Other (See Comments)    Desc: White blisters in mouth during ivp in [place] '93, ok w/ 13 hour prep today//a.calhoun, Onset Date: 10/30/1991    Iodinated Contrast Media Itching and Rash   Penicillins Itching and Rash    Has patient had a PCN reaction causing immediate rash, facial/tongue/throat swelling, SOB or lightheadedness with hypotension: Yes Has patient had a PCN reaction causing severe rash involving mucus membranes or skin necrosis: No Has patient had a PCN reaction that required hospitalization: No Has patient had a PCN reaction occurring within the last 10 years: No If all of the above answers are "NO", then may proceed with Cephalosporin use.   Sulfa Antibiotics Itching, Rash and Other (See Comments)   Outpatient Encounter Medications as of 12/28/2023  Medication Sig   albuterol (VENTOLIN HFA) 108 (90 Base) MCG/ACT inhaler Inhale 2 puffs into the lungs every 4 (four) hours as needed for wheezing or shortness of breath.   amLODipine-olmesartan (AZOR) 5-20 MG tablet Take 1 tablet by mouth daily.   blood glucose meter kit and supplies Dispense based on patient and insurance preference. Use up to four times daily as directed. (FOR ICD-10 E10.9, E11.9).   Budeson-Glycopyrrol-Formoterol (BREZTRI AEROSPHERE) 160-9-4.8 MCG/ACT AERO Inhale into the lungs.   Calcium Carb-Cholecalciferol (CALCIUM 600 + D PO) Take 1 tablet by mouth daily.   cetirizine (ZYRTEC) 10 MG tablet TAKE ONE (1) TABLET BY MOUTH EVERY DAY   clobetasol cream (TEMOVATE) 0.05 % Apply 1 Application topically 2 (two) times daily.   cyclobenzaprine (FLEXERIL) 10 MG tablet TAKE ONE TABLET BY MOUTH THREE TIMES DAILY AS NEEDED FOR MUSCLE SPASM   diphenhydrAMINE (BENADRYL) 50 MG tablet Take 1 tablet (50 mg total) by mouth once for 1 dose. Take on date/time instructed by  prescribing provider.  Take within 1 hour of contrast injection. Call [phone] for questions.   esomeprazole (NEXIUM) 40 MG capsule TAKE ONE CAPSULE BY MOUTH DAILY   famotidine (PEPCID) 40 MG tablet Take 1 tablet (40 mg total) by mouth at bedtime.   fluticasone (FLONASE) 50 MCG/ACT nasal spray Place 2 sprays into both nostrils daily. (Patient taking differently: Place 2 sprays into both nostrils daily as needed for allergies.)   hydrOXYzine (ATARAX) 25 MG tablet Take 1 tablet (25 mg total) by mouth every 4 (four) hours as needed for itching.   ibuprofen (  ADVIL) 200 MG tablet Take 400 mg by mouth every 6 (six) hours as needed for moderate pain.   ipratropium-albuterol (DUONEB) 0.5-2.5 (3) MG/3ML SOLN Take 3 mLs by nebulization every 6 (six) hours as needed (Foir shortness of breath).   LORazepam (ATIVAN) 0.5 MG tablet TAKE 1 TABLET BY MOUTH EVERY 8 HOURS AS NEEDED FOR ANXIETY   magnesium oxide (MAG-OX) 400 MG tablet Take 1 tablet (400 mg total) by mouth daily. (Patient taking differently: Take 800 mg by mouth 2 (two) times daily.)   MELATONIN PO Take 1 tablet by mouth at bedtime as needed (sleep).   metFORMIN (GLUCOPHAGE) 1000 MG tablet TAKE ONE (1) TABLET BY MOUTH TWO (2) TIMES DAILY   metoprolol tartrate (LOPRESSOR) 50 MG tablet TAKE ONE (1) TABLET BY MOUTH TWO (2) TIMES DAILY   nitroGLYCERIN (NITROSTAT) 0.4 MG SL tablet Place 1 tablet (0.4 mg total) under the tongue every 5 (five) minutes as needed for chest pain.   ondansetron (ZOFRAN-ODT) 8 MG disintegrating tablet Take 1 tablet (8 mg total) by mouth every 6 (six) hours as needed for nausea or vomiting.   predniSONE (DELTASONE) 50 MG tablet Take 1 tablet (50 mg total) by mouth as directed for 3 doses. Take on dates/times instructed by prescribing provider.  Take 1 prednisone tab (50 mg orally) 13, 7, and 1 hour before contrast injection. Call [phone] for questions.   Vitamin D, Ergocalciferol, (DRISDOL) 1.25 MG (50000 UNIT) CAPS capsule  Take 1 capsule (50,000 Units total) by mouth every 7 (seven) days.   No facility-administered encounter medications on file as of 12/28/2023.    Past Surgical History:  Procedure Laterality Date   CARDIAC CATHETERIZATION N/A 10/29/2015   Procedure: Left Heart Cath and Coronary Angiography;  Surgeon: Runell Gess, MD;  Location: [facility] INVASIVE CV LAB;  Service: Cardiovascular;  Laterality: N/A;   CHOLECYSTECTOMY     CORONARY ARTERY BYPASS GRAFT N/A 11/02/2015   Procedure: CORONARY ARTERY BYPASS GRAFTING (CABG)x 1 using left internal mammary artery.;  Surgeon: Loreli Slot, MD;  Location: [facility] OR;  Service: Open Heart Surgery;  Laterality: N/A;   LEFT OOPHORECTOMY Left    Related to tubal torsion.  Bilateral salpingectomy also.  Remaining uterus and right ovary   LUMBAR FUSION     TEE WITHOUT CARDIOVERSION N/A 11/02/2015   Procedure: TRANSESOPHAGEAL ECHOCARDIOGRAM (TEE);  Surgeon: Loreli Slot, MD;  Location: [facility] OR;  Service: Open Heart Surgery;  Laterality: N/A;   XI ROBOTIC ASSISTED VENTRAL HERNIA N/A 01/11/2023   Procedure: XI ROBOTIC ASSISTED VENTRAL HERNIA REPAIR W/ MESH- SUPRAUMBILICAL;  Surgeon: Lewie Chamber, DO;  Location: [facility] ORS;  Service: General;  Laterality: N/A;    Family History  Problem Relation Age of Onset   Breast cancer Mother    Colon polyps Father    Esophageal cancer Maternal Grandfather    Stomach cancer Paternal Grandmother    Ovarian cancer Other        Maternal Side Great  Aunt   Colon cancer Neg Hx    Rectal cancer Neg Hx       Controlled substance contract: 08/30/22  Hgba1c 9.0   Review of Systems  Constitutional:  Negative for diaphoresis.  Eyes:  Negative for pain.  Respiratory:  Positive for cough, shortness of breath and wheezing.   Cardiovascular:  Negative for chest pain, palpitations and leg swelling.  Gastrointestinal:  Negative for abdominal pain.  Endocrine: Negative for polydipsia.  Skin:  Negative for rash.   Neurological:  Negative  for dizziness, weakness and headaches.  Hematological:  Does not bruise/bleed easily.  All other systems reviewed and are negative.      Objective:   Physical Exam Vitals and nursing note reviewed.  Constitutional:      General: She is not in acute distress.    Appearance: Normal appearance. She is well-developed.  HENT:     Head: Normocephalic.     Right Ear: Tympanic membrane normal.     Left Ear: Tympanic membrane normal.     Nose: Nose normal.     Mouth/Throat:     Mouth: Mucous membranes are moist.  Eyes:     Pupils: Pupils are equal, round, and reactive to light.  Neck:     Vascular: No carotid bruit or JVD.  Cardiovascular:     Rate and Rhythm: Normal rate and regular rhythm.     Heart sounds: Normal heart sounds.  Pulmonary:     Effort: Pulmonary effort is normal. No respiratory distress.     Breath sounds: Wheezing (exp throughout) present. No rales.  Chest:     Chest wall: No tenderness.  Abdominal:     General: Bowel sounds are normal. There is no distension or abdominal bruit.     Palpations: Abdomen is soft. There is no hepatomegaly, splenomegaly, mass or pulsatile mass.     Tenderness: There is no abdominal tenderness.  Musculoskeletal:        General: Normal range of motion.     Cervical back: Normal range of motion and neck supple.  Lymphadenopathy:     Cervical: No cervical adenopathy.  Skin:    General: Skin is warm and dry.  Neurological:     Mental Status: She is alert and oriented to person, place, and time.     Deep Tendon Reflexes: Reflexes are normal and symmetric.  Psychiatric:        Behavior: Behavior normal.        Thought Content: Thought content normal.        Judgment: Judgment normal.    BP (!) 155/84   Pulse 76   Temp (!) 97.3 F (36.3 C) (Temporal)   Ht 5\' 8"  (1.727 m)   Wt 206 lb (93.4 kg)   LMP 06/03/2017 (Exact Date)   SpO2 99%   BMI 31.32 kg/m     LMP 06/03/2017 (Exact Date)   Hgba1c  8.8%    Assessment & Plan:   Brenda Cruz comes in today with chief complaint of No chief complaint on file.   Diagnosis and orders addressed:  1. Essential hypertension Low sodium diet Keep diary of blood pressure Take meds daily as prescribed - CBC with Differential/Platelet - CMP14+EGFR  2. Coronary artery disease involving native coronary artery of native heart with angina pectoris (HCC) Needs to follow up with cardiology - magnesium oxide (MAG-OX) 400 MG tablet; Take 1 tablet (400 mg total) by mouth daily.  Dispense: 90 tablet; Refill: 1  3. Mucopurulent chronic bronchitis (HCC) Added symbicort to meds - budesonide-formoterol (SYMBICORT) 80-4.5 MCG/ACT inhaler; Inhale 2 puffs into the lungs 2 (two) times daily.  Dispense: 1 each; Refill: 3  4. Gastroesophageal reflux disease without esophagitis Avoid spicy foods Do not eat 2 hours prior to bedtime  - Lipase  5. Type 2 diabetes mellitus with hyperglycemia, without long-term current use of insulin (HCC) Added back rybelsus - Bayer DCA Hb A1c Waived - Ozempic 0.25mg  subcutaneous ; Take 1 tablet (3 mg total)weekly.  Dispense: 3ml Refill: 2  6.  Mixed hyperlipidemia Low fat diet - Lipid panel  7. Stress incontinence of urine  8. Recurrent major depressive disorder, in partial remission (HCC) Stress mangement  9. GAD (generalized anxiety disorder) Stress manaegment - LORazepam (ATIVAN) 0.5 MG tablet; Take 1 tablet (0.5 mg total) by mouth every 8 (eight) hours as needed for anxiety.  Dispense: 90 tablet; Refill: 2  10. Iron deficiency anemia secondary to inadequate dietary iron intake Labs pending  11. 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) Yearly check ups  12. Tobacco abuse SMOKING CESSATION encouraged  13. Obesity with body mass index of 30.0-39.9 Discussed diet and exercise for person with BMI >25 Will recheck weight in 3-6 months   14. Fatigue, unspecified type Labs pending - Thyroid  Panel With TSH  15. Low magnesium level Lab spending - Magnesium   Labs pending Health Maintenance reviewed Diet and exercise encouraged  Follow up plan: 3 months   [name] Daphine Deutscher, [name]

## 2023-12-29 LAB — CBC WITH DIFFERENTIAL/PLATELET
Basophils Absolute: 0.1 10*3/uL (ref 0.0–0.2)
Basos: 1 %
EOS (ABSOLUTE): 0.2 10*3/uL (ref 0.0–0.4)
Eos: 2 %
Hematocrit: 41.9 % (ref 34.0–46.6)
Hemoglobin: 13 g/dL (ref 11.1–15.9)
Immature Grans (Abs): 0 10*3/uL (ref 0.0–0.1)
Immature Granulocytes: 0 %
Lymphocytes Absolute: 2.3 10*3/uL (ref 0.7–3.1)
Lymphs: 27 %
MCH: 28.9 pg (ref 26.6–33.0)
MCHC: 31 g/dL — ABNORMAL LOW (ref 31.5–35.7)
MCV: 93 fL (ref 79–97)
Monocytes Absolute: 0.7 10*3/uL (ref 0.1–0.9)
Monocytes: 8 %
Neutrophils Absolute: 5.4 10*3/uL (ref 1.4–7.0)
Neutrophils: 62 %
Platelets: 249 10*3/uL (ref 150–450)
RBC: 4.5 x10E6/uL (ref 3.77–5.28)
RDW: 12.3 % (ref 11.7–15.4)
WBC: 8.7 10*3/uL (ref 3.4–10.8)

## 2023-12-29 LAB — CMP14+EGFR
ALT: 38 IU/L — ABNORMAL HIGH (ref 0–32)
AST: 55 IU/L — ABNORMAL HIGH (ref 0–40)
Albumin: 4.1 g/dL (ref 3.8–4.9)
Alkaline Phosphatase: 96 IU/L (ref 44–121)
BUN/Creatinine Ratio: 5 — ABNORMAL LOW (ref 9–23)
BUN: 5 mg/dL — ABNORMAL LOW (ref 6–24)
Bilirubin Total: 0.2 mg/dL (ref 0.0–1.2)
CO2: 24 mmol/L (ref 20–29)
Calcium: 9.1 mg/dL (ref 8.7–10.2)
Chloride: 98 mmol/L (ref 96–106)
Creatinine, Ser: 1 mg/dL (ref 0.57–1.00)
Globulin, Total: 2.7 g/dL (ref 1.5–4.5)
Glucose: 233 mg/dL — ABNORMAL HIGH (ref 70–99)
Potassium: 4.4 mmol/L (ref 3.5–5.2)
Sodium: 139 mmol/L (ref 134–144)
Total Protein: 6.8 g/dL (ref 6.0–8.5)
eGFR: 67 mL/min/{1.73_m2} (ref >59)

## 2023-12-29 LAB — LIPID PANEL
Cholesterol, Total: 280 mg/dL — ABNORMAL HIGH (ref 100–199)
HDL: 26 mg/dL — ABNORMAL LOW (ref >39)
LDL CALC COMMENT:: 10.8 ratio — ABNORMAL HIGH (ref 0.0–4.4)
LDL Chol Calc (NIH): 152 mg/dL — ABNORMAL HIGH (ref 0–99)
Triglycerides: 529 mg/dL — ABNORMAL HIGH (ref 0–149)
VLDL Cholesterol Cal: 102 mg/dL — ABNORMAL HIGH (ref 5–40)

## 2023-12-29 LAB — MICROALBUMIN / CREATININE URINE RATIO
Creatinine, Urine: 158 mg/dL
Microalb/Creat Ratio: 73 mg/g{creat} — ABNORMAL HIGH (ref 0–29)
Microalbumin, Urine: 116 ug/mL

## 2024-01-01 LAB — TOXASSURE SELECT 13 (MW), URINE

## 2024-01-12 ENCOUNTER — Ambulatory Visit (HOSPITAL_BASED_OUTPATIENT_CLINIC_OR_DEPARTMENT_OTHER): Payer: Self-pay

## 2024-01-16 ENCOUNTER — Other Ambulatory Visit: Payer: Self-pay | Admitting: Family Medicine

## 2024-01-20 ENCOUNTER — Other Ambulatory Visit: Payer: Self-pay | Admitting: Nurse Practitioner

## 2024-01-20 DIAGNOSIS — E1165 Type 2 diabetes mellitus with hyperglycemia: Secondary | ICD-10-CM

## 2024-02-08 ENCOUNTER — Ambulatory Visit: Payer: Managed Care, Other (non HMO) | Admitting: Pulmonary Disease

## 2024-02-15 ENCOUNTER — Ambulatory Visit: Payer: Self-pay

## 2024-02-15 NOTE — Telephone Encounter (Signed)
  Chief Complaint: s/p tick removal from right side of neck 02/12/2024 Symptoms: swelling, redness and pus coming from the area Frequency: Since 02/12/2024 Pertinent Negatives: Patient denies fever, CP, SOB Disposition: [] ED /[] Urgent Care (no appt availability in office) / [] Appointment(In office/virtual)/ []  [facility] [place] [facility]/ [] Home Care/ [] Refused Recommended Disposition /[] [place] [facility] [facility]/ [x]  Follow-up with PCP Additional Notes: patient calling to report swelling, redness and pus from area. Patient denies fever. Patient states she thinks she was bit Sunday and then removed the tick on Monday. Patient states the tick had a white spot on the back and there was bleeding when she removed the tick.  Patient is asking for PCP to call her back and to send antibiotics to her pharmacy. Patient states she just started a new job and availability for an appointment is questionable. Patient was instructed if PCP unable to get back to her today to be seen in Urgent Care. Patient verbalized understanding of plan and all questions answered. Asking for a phone call back from PCP.    Copied from CRM [id number]. Topic: Clinical - Red Word Triage >> Feb 15, 2024 11:56 AM Retta Caster wrote: Red Word that prompted transfer to Nurse Triage: Tick bite swollen and puss coming out. since 04/28 Reason for Disposition  [1] Red streak or red line AND [2] length > 2 inches (5 cm)  Answer Assessment - Initial Assessment Questions 1. ATTACHED:  "Is the tick still on the skin?"  (e.g., yes, no, unsure)     no 2. ONSET - TICK STILL ATTACHED:  "How long do you think the tick has been on your skin?" (e.g., hours, days, unsure)  Note:  Is there a recent activity (camping, hiking) where the caller may have been exposed?     N/A 3. ONSET - TICK NOT STILL ATTACHED: "If the tick has been removed, how long do you think the tick was attached before you removed it?" (e.g., 5 hours, 2 days). "When was this?"     Probably  attached on Sunday and removed on Monday 4. LOCATION: "Where is the tick bite located?" (e.g., arm, leg)     Right side of neck.  5. TYPE of TICK: "Is it a wood tick or a deer tick?" (e.g., deer tick, wood tick; unsure)     Patient reports a tick with a white spot on back 6. SIZE of TICK: "How big is the tick?" (e.g., size of poppy seed, apple seed, watermelon seed; unsure) Note: Deer ticks can be the size of a poppy seed (nymph) or an apple seed (adult).       big 7. ENGORGED: "Did the tick look flat or engorged (full, swollen)?" (e.g., flat, engorged; unsure)     engorged 8. OTHER SYMPTOMS: "Do you have any other symptoms?" (e.g., fever, rash, redness at bite area, red ring around bite)     Red, swollen and pus coming out.  Protocols used: Tick Bite-A-AH

## 2024-02-15 NOTE — Telephone Encounter (Signed)
 She will need to do an evisit- or we can do video visit tomorrow

## 2024-02-15 NOTE — Telephone Encounter (Signed)
**Note De-identified  [place] Obfuscation** Please advise 

## 2024-02-15 NOTE — Telephone Encounter (Signed)
 Called and made patient a video visit for tomorrow

## 2024-02-16 ENCOUNTER — Other Ambulatory Visit: Payer: Self-pay | Admitting: Nurse Practitioner

## 2024-02-16 ENCOUNTER — Encounter: Payer: Self-pay | Admitting: Nurse Practitioner

## 2024-02-16 ENCOUNTER — Telehealth (INDEPENDENT_AMBULATORY_CARE_PROVIDER_SITE_OTHER): Admitting: Nurse Practitioner

## 2024-02-16 DIAGNOSIS — S1086XA Insect bite of other specified part of neck, initial encounter: Secondary | ICD-10-CM | POA: Diagnosis not present

## 2024-02-16 DIAGNOSIS — W57XXXA Bitten or stung by nonvenomous insect and other nonvenomous arthropods, initial encounter: Secondary | ICD-10-CM | POA: Diagnosis not present

## 2024-02-16 DIAGNOSIS — K219 Gastro-esophageal reflux disease without esophagitis: Secondary | ICD-10-CM

## 2024-02-16 MED ORDER — DOXYCYCLINE HYCLATE 100 MG PO TABS: 100.0000 mg | ORAL_TABLET | Freq: Two times a day (BID) | ORAL | 0 refills | Status: DC

## 2024-02-16 MED ORDER — FLUCONAZOLE 150 MG PO TABS: ORAL_TABLET | ORAL | 0 refills | Status: DC

## 2024-02-16 NOTE — Progress Notes (Signed)
 Virtual Visit Consent   Brenda Cruz, you are scheduled for a virtual visit with [name], [name], a [facility] provider, today.     Just as with appointments in the office, your consent must be obtained to participate.  Your consent will be active for this visit and any virtual visit you may have with one of our providers in the next 365 days.     If you have a MyChart account, a copy of this consent can be sent to you electronically.  All virtual visits are billed to your insurance company just like a traditional visit in the office.    As this is a virtual visit, video technology does not allow for your provider to perform a traditional examination.  This may limit your provider's ability to fully assess your condition.  If your provider identifies any concerns that need to be evaluated in person or the need to arrange testing (such as labs, EKG, etc.), we will make arrangements to do so.     Although advances in technology are sophisticated, we cannot ensure that it will always work on either your end or our end.  If the connection with a video visit is poor, the visit may have to be switched to a telephone visit.  With either a video or telephone visit, we are not always able to ensure that we have a secure connection.     I need to obtain your verbal consent now.   Are you willing to proceed with your visit today? YES   Brenda Cruz has provided verbal consent on 02/16/2024 for a virtual visit (video or telephone).   [name], [name]   Date: 02/16/2024 8:17 AM   Virtual Visit via Video Note   I, [name], connected with Brenda Cruz ([id number], 06/02/1966) on 02/16/24 at 12:05 PM EDT by a video-enabled telemedicine application and verified that I am speaking with the correct person using two identifiers.  Location: Patient: Virtual Visit Location Patient: Mobile Provider: Virtual Visit Location Provider: Mobile   I discussed the limitations  of evaluation and management by telemedicine and the availability of in person appointments. The patient expressed understanding and agreed to proceed.    History of Present Illness: Brenda Cruz is a 58 y.o. who identifies as a female who was assigned female at birth, and is being seen today for tick bite.  HPI: Patient says has she was bitten by a tick several days ago. Was on there for about 48 hours. Having some fatigue.    Review of Systems  Constitutional:  Positive for malaise/fatigue.  Musculoskeletal:  Positive for myalgias.    Problems:  Patient Active Problem List   Diagnosis Date Noted   Dermatitis 08/14/2023   History of lumbar fusion 08/11/2023   Hair loss 08/03/2023   Obstructive sleep apnea 06/21/2023   Ventral hernia without obstruction or gangrene 01/11/2023   Inflammatory disorder of digestive system 01/17/2022   Screen for colon cancer 11/24/2020   History of cancer of left breast 11/24/2020   History of external beam radiation therapy 11/24/2020   History of lumpectomy of left breast 11/24/2020   Tobacco abuse 07/02/2019   Cancer related pain 03/01/2018   Skin yeast infection 02/07/2018   Hypomagnesemia 01/29/2018   Hypokalemia 01/23/2018   Candida UTI 01/05/2018   Anxiety in cancer patient 12/27/2017   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) 12/15/2017   Type 2 diabetes mellitus (HCC) 12/15/2017   Family history  of breast cancer in first degree relative 12/15/2017   Obesity with body mass index of 30.0-39.9 12/04/2017   Hyperlipidemia 03/15/2017   Stress incontinence of urine 12/07/2016   COPD (chronic obstructive pulmonary disease) (HCC) 12/07/2016   Hx of pulmonary embolus 12/07/2015   Iron deficiency anemia 11/23/2015   S/P CABG x 1 11/02/2015   Coronary artery disease involving native coronary artery of native heart with angina pectoris (HCC)    Essential hypertension 10/16/2015   GERD (gastroesophageal reflux disease) 05/16/2013    Depression 05/16/2013   GAD (generalized anxiety disorder) 05/16/2013    Allergies:  Allergies  Allergen Reactions   Ace Inhibitors Cough   Atorvastatin  Other (See Comments)    Myalgia    Farxiga  [Dapagliflozin ] Other (See Comments)    UTI, heart palpitations   Heparin  Other (See Comments)    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   Iohexol  Itching, Rash and Other (See Comments)    Desc: White blisters in mouth during ivp in [place] '93, ok w/ 13 hour prep today//a.calhoun, Onset Date: 10/30/1991    Iodinated Contrast Media Itching and Rash   Penicillins Itching and Rash    Has patient had a PCN reaction causing immediate rash, facial/tongue/throat swelling, SOB or lightheadedness with hypotension: Yes Has patient had a PCN reaction causing severe rash involving mucus membranes or skin necrosis: No Has patient had a PCN reaction that required hospitalization: No Has patient had a PCN reaction occurring within the last 10 years: No If all of the above answers are "NO", then may proceed with Cephalosporin use.   Sulfa Antibiotics Itching, Rash and Other (See Comments)   Medications:  Current Outpatient Medications:    albuterol  (VENTOLIN  HFA) 108 (90 Base) MCG/ACT inhaler, Inhale 2 puffs into the lungs every 4 (four) hours as needed for wheezing or shortness of breath., Disp: 18 g, Rfl: 1   amLODipine -olmesartan  (AZOR ) 5-20 MG tablet, Take 1 tablet by mouth daily., Disp: 30 tablet, Rfl: 0   blood glucose meter kit and supplies, Dispense based on patient and insurance preference. Use up to four times daily as directed. (FOR ICD-10 E10.9, E11.9)., Disp: 1 each, Rfl: 0   Budeson-Glycopyrrol-Formoterol  (BREZTRI AEROSPHERE) 160-9-4.8 MCG/ACT AERO, Inhale into the lungs., Disp: , Rfl:    Calcium  Carb-Cholecalciferol (CALCIUM  600 + D PO), Take 1 tablet by mouth daily., Disp: , Rfl:    cetirizine  (ZYRTEC ) 10 MG tablet, TAKE ONE (1) TABLET BY MOUTH EVERY DAY, Disp: 30 tablet, Rfl: 4    clobetasol  cream (TEMOVATE ) 0.05 %, Apply 1 Application topically 2 (two) times daily., Disp: 60 g, Rfl: 0   cyclobenzaprine  (FLEXERIL ) 10 MG tablet, TAKE ONE TABLET BY MOUTH THREE TIMES DAILY AS NEEDED FOR MUSCLE SPASM, Disp: 30 tablet, Rfl: 0   diphenhydrAMINE  (BENADRYL ) 50 MG tablet, Take 1 tablet (50 mg total) by mouth once for 1 dose. Take on date/time instructed by prescribing provider.  Take within 1 hour of contrast injection. Call [phone] for questions., Disp: 1 tablet, Rfl: 0   esomeprazole  (NEXIUM ) 40 MG capsule, Take 1 capsule (40 mg total) by mouth daily., Disp: 90 capsule, Rfl: 1   famotidine  (PEPCID ) 40 MG tablet, Take 1 tablet (40 mg total) by mouth at bedtime., Disp: 90 tablet, Rfl: 0   fluticasone  (FLONASE ) 50 MCG/ACT nasal spray, Place 2 sprays into both nostrils daily. (Patient taking differently: Place 2 sprays into both nostrils daily as needed for allergies.), Disp: 16 g, Rfl: 6   ibuprofen  (ADVIL ) 200 MG  tablet, Take 400 mg by mouth every 6 (six) hours as needed for moderate pain., Disp: , Rfl:    ipratropium-albuterol  (DUONEB) 0.5-2.5 (3) MG/3ML SOLN, Take 3 mLs by nebulization every 6 (six) hours as needed (Foir shortness of breath)., Disp: 360 mL, Rfl: 0   LORazepam  (ATIVAN ) 0.5 MG tablet, Take 1 tablet (0.5 mg total) by mouth every 8 (eight) hours as needed. for anxiety, Disp: 90 tablet, Rfl: 1   magnesium  oxide (MAG-OX) 400 MG tablet, Take 2 tablets (800 mg total) by mouth 2 (two) times daily., Disp: 180 tablet, Rfl: 1   metFORMIN  (GLUCOPHAGE ) 1000 MG tablet, TAKE ONE (1) TABLET BY MOUTH TWO (2) TIMES DAILY, Disp: 60 tablet, Rfl: 2   metoprolol  tartrate (LOPRESSOR ) 50 MG tablet, Take 1 tablet (50 mg total) by mouth 2 (two) times daily., Disp: 60 tablet, Rfl: 5   nitroGLYCERIN  (NITROSTAT ) 0.4 MG SL tablet, Place 1 tablet (0.4 mg total) under the tongue every 5 (five) minutes as needed for chest pain., Disp: 90 tablet, Rfl: 0   ondansetron  (ZOFRAN -ODT) 8 MG disintegrating  tablet, TAKE 1 TABLET BY MOUTH EVERY 6 HOURS AS NEEDED FOR NAUSEA & VOMITING, Disp: 20 tablet, Rfl: 0   Semaglutide ,0.25 or 0.5MG /DOS, 2 MG/3ML SOPN, Inject 0.25 mg into the skin once a week., Disp: 3 mL, Rfl: 2   Vitamin D , Ergocalciferol , (DRISDOL ) 1.25 MG (50000 UNIT) CAPS capsule, Take 1 capsule (50,000 Units total) by mouth every 7 (seven) days., Disp: 13 capsule, Rfl: 3  Observations/Objective: Patient is well-developed, well-nourished in no acute distress.  Resting comfortably  at home.  Head is normocephalic, atraumatic.  No labored breathing.  Speech is clear and coherent with logical content.  Patient is alert and oriented at baseline.  Right side neck below right ear- is enlarged and erythematous  Assessment and Plan:  Charissa Compton in today with chief complaint of tick bite  1. Tick bite of other part of neck, initial encounter (Primary) Avoid picking at area Let me know if does not get better. - doxycycline  (VIBRA -TABS) 100 MG tablet; Take 1 tablet (100 mg total) by mouth 2 (two) times daily. 1 po bid  Dispense: 28 tablet; Refill: 0 - fluconazole  (DIFLUCAN ) 150 MG tablet; 1 po q week x 4 weeks  Dispense: 4 tablet; Refill: 0   Follow Up Instructions: I discussed the assessment and treatment plan with the patient. The patient was provided an opportunity to ask questions and all were answered. The patient agreed with the plan and demonstrated an understanding of the instructions.  A copy of instructions were sent to the patient via MyChart.  The patient was advised to call back or seek an in-person evaluation if the symptoms worsen or if the condition fails to improve as anticipated.  Time:  I spent 12 minutes with the patient via telehealth technology discussing the above problems/concerns.    [name], [name]

## 2024-03-18 ENCOUNTER — Encounter: Payer: Self-pay | Admitting: Nurse Practitioner

## 2024-03-18 ENCOUNTER — Ambulatory Visit (INDEPENDENT_AMBULATORY_CARE_PROVIDER_SITE_OTHER): Admitting: Nurse Practitioner

## 2024-03-18 VITALS — BP 169/87 | HR 81 | Temp 98.2°F | Ht 68.0 in | Wt 200.0 lb

## 2024-03-18 DIAGNOSIS — M791 Myalgia, unspecified site: Secondary | ICD-10-CM | POA: Diagnosis not present

## 2024-03-18 DIAGNOSIS — R5081 Fever presenting with conditions classified elsewhere: Secondary | ICD-10-CM | POA: Diagnosis not present

## 2024-03-18 DIAGNOSIS — R0789 Other chest pain: Secondary | ICD-10-CM

## 2024-03-18 MED ORDER — KETOROLAC TROMETHAMINE 60 MG/2ML IM SOLN
60.0000 mg | Freq: Once | INTRAMUSCULAR | Status: AC
  Administered 2024-03-18: 60 mg via INTRAMUSCULAR

## 2024-03-18 MED ORDER — METHYLPREDNISOLONE ACETATE 80 MG/ML IJ SUSP
80.0000 mg | Freq: Once | INTRAMUSCULAR | Status: AC
  Administered 2024-03-18: 80 mg via INTRAMUSCULAR

## 2024-03-18 NOTE — Progress Notes (Signed)
 Subjective:    Patient ID: Brenda Cruz, female    DOB: Jan 13, 1966, 58 y.o.   MRN: [id number]  Chief Complaint: Joint Pain (Fever all last week/Recent tick bite but treated with doxy)   HPI  Patient comes in today c/o fever and body aches for the past several days. She also says she has had chest tightness and SOB. She has had a recent tick bite but was treated with doxycycline . Patient Active Problem List   Diagnosis Date Noted   Dermatitis 08/14/2023   History of lumbar fusion 08/11/2023   Hair loss 08/03/2023   Obstructive sleep apnea 06/21/2023   Ventral hernia without obstruction or gangrene 01/11/2023   Inflammatory disorder of digestive system 01/17/2022   Screen for colon cancer 11/24/2020   History of cancer of left breast 11/24/2020   History of external beam radiation therapy 11/24/2020   History of lumpectomy of left breast 11/24/2020   Tobacco abuse 07/02/2019   Cancer related pain 03/01/2018   Skin yeast infection 02/07/2018   Hypomagnesemia 01/29/2018   Hypokalemia 01/23/2018   Candida UTI 01/05/2018   Anxiety in cancer patient 12/27/2017   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) 12/15/2017   Type 2 diabetes mellitus (HCC) 12/15/2017   Family history of breast cancer in first degree relative 12/15/2017   Obesity with body mass index of 30.0-39.9 12/04/2017   Hyperlipidemia 03/15/2017   Stress incontinence of urine 12/07/2016   COPD (chronic obstructive pulmonary disease) (HCC) 12/07/2016   Hx of pulmonary embolus 12/07/2015   Iron deficiency anemia 11/23/2015   S/P CABG x 1 11/02/2015   Coronary artery disease involving native coronary artery of native heart with angina pectoris (HCC)    Essential hypertension 10/16/2015   GERD (gastroesophageal reflux disease) 05/16/2013   Depression 05/16/2013   GAD (generalized anxiety disorder) 05/16/2013       Review of Systems  Constitutional:  Positive for fever (intermittent). Negative for  diaphoresis.  Eyes:  Negative for pain.  Respiratory:  Negative for shortness of breath.   Cardiovascular:  Negative for chest pain, palpitations and leg swelling.  Gastrointestinal:  Negative for abdominal pain.  Endocrine: Negative for polydipsia.  Musculoskeletal:  Positive for arthralgias.  Skin:  Negative for rash.  Neurological:  Negative for dizziness, weakness and headaches.  Hematological:  Does not bruise/bleed easily.  All other systems reviewed and are negative.      Objective:   Physical Exam Constitutional:      Appearance: Normal appearance.  Cardiovascular:     Rate and Rhythm: Normal rate and regular rhythm.     Heart sounds: Normal heart sounds.  Pulmonary:     Effort: Pulmonary effort is normal.     Breath sounds: Normal breath sounds.  Neurological:     General: No focal deficit present.     Mental Status: She is alert and oriented to person, place, and time.  Psychiatric:        Mood and Affect: Mood normal.        Behavior: Behavior normal.     BP (!) 169/87   Pulse 81   Temp 98.2 F (36.8 C) (Oral)   Ht 5\' 8"  (1.727 m)   Wt 200 lb (90.7 kg)   LMP 06/03/2017 (Exact Date)   SpO2 99%   BMI 30.41 kg/m    EKG- NSR- old infarct- we are aware of this-Preliminary reading by Irvine Mantis, [name]  [facility]     Assessment & Plan:   Brenda Cruz  C Aschoff in today with chief complaint of Joint Pain (Fever all last week/Recent tick bite but treated with doxy)   1. Chest tightness (Primary) - EKG 12-Lead  2. Fever in other diseases Motrin  or tylenol  OTC - Lyme Disease Serology w/Reflex - CBC with Differential/Platelet  3. Myalgia Moist heat Rest Labs pending - Lyme Disease Serology w/Reflex - CBC with Differential/Platelet - methylPREDNISolone  acetate (DEPO-MEDROL ) injection 80 mg - ketorolac (TORADOL) injection 60 mg    The above assessment and management plan was discussed with the patient. The patient verbalized understanding of and has agreed to  the management plan. Patient is aware to call the clinic if symptoms persist or worsen. Patient is aware when to return to the clinic for a follow-up visit. Patient educated on when it is appropriate to go to the emergency department.   Brenda Gaylyn Keas, [name]

## 2024-03-18 NOTE — Patient Instructions (Signed)
 Muscle and Joint Pain: What It Means Muscle and joint pain is aches and pains in your bones, joints, muscles, and tissues that surround them. This pain can happen in any part of the body. It can last for a short time or a long time. To find the cause of the pain, you may have: A physical exam. Lab tests. Imaging tests. Follow these instructions at home: Medicines Take your medicines only as told. Treatment may include medicines for pain and inflammation. You might need to take medicines by mouth or put them on your skin. Managing pain, stiffness, and swelling     Use ice or an ice pack as told. Place a towel between your skin and the ice. Leave the ice on for 20 minutes, 2-3 times a day. Use heat as told. Use the heat source that your health care provider recommends, such as a moist heat pack or a heating pad. Do this as often as told. Place a towel between your skin and the heat source. Leave the heat on for 20-30 minutes. If your skin turns red, take off the ice or heat right away to prevent burns. The risk of burns is higher if you can't feel pain, heat, or cold. Activity When your pain is very bad, bed rest may be helpful. Lie or sit in any position that's comfortable. Get up to take short walks at least every 2 hours during the day. This helps you breathe better and keeps your blood flowing. Ask for help if you feel weak or unsteady. You may continue doing your usual activities unless the activities cause more pain. When the pain gets better, slowly increase the time and intensity of your activities or exercise. Lifestyle Learn to manage anxiety and stress. Stress increases muscle tension and can make pain worse. Ask your provider if you should try: Meditation or yoga. Therapy. Acupuncture or massage. General instructions Your provider may tell you to see a physical therapist to help you do exercises to improve movement and strength in the affected area. Exercise as  told. Contact a health care provider if: Your pain gets worse. Medicines do not help your pain. You can't use the part of your body that hurts, such as your arm, leg, or neck. You have trouble sleeping. You have trouble doing your usual activities. Get help right away if: You have a new injury and your pain is worse or different. You feel numb or you have tingling in the painful area. This information is not intended to replace advice given to you by your health care provider. Make sure you discuss any questions you have with your health care provider. Document Revised: 03/03/2023 Document Reviewed: 02/15/2023 Elsevier Patient Education  2024 [employer].

## 2024-03-19 ENCOUNTER — Ambulatory Visit: Payer: Self-pay | Admitting: Nurse Practitioner

## 2024-03-19 LAB — CBC WITH DIFFERENTIAL/PLATELET
Basophils Absolute: 0.1 10*3/uL (ref 0.0–0.2)
Basos: 1 %
EOS (ABSOLUTE): 0.2 10*3/uL (ref 0.0–0.4)
Eos: 3 %
Hematocrit: 39.1 % (ref 34.0–46.6)
Hemoglobin: 12.5 g/dL (ref 11.1–15.9)
Immature Grans (Abs): 0.1 10*3/uL (ref 0.0–0.1)
Immature Granulocytes: 1 %
Lymphocytes Absolute: 2.5 10*3/uL (ref 0.7–3.1)
Lymphs: 28 %
MCH: 29.6 pg (ref 26.6–33.0)
MCHC: 32 g/dL (ref 31.5–35.7)
MCV: 92 fL (ref 79–97)
Monocytes Absolute: 0.7 10*3/uL (ref 0.1–0.9)
Monocytes: 8 %
Neutrophils Absolute: 5.4 10*3/uL (ref 1.4–7.0)
Neutrophils: 59 %
Platelets: 270 10*3/uL (ref 150–450)
RBC: 4.23 x10E6/uL (ref 3.77–5.28)
RDW: 12.4 % (ref 11.7–15.4)
WBC: 8.9 10*3/uL (ref 3.4–10.8)

## 2024-03-19 LAB — LYME DISEASE SEROLOGY W/REFLEX: Lyme Total Antibody EIA: NEGATIVE

## 2024-03-25 ENCOUNTER — Ambulatory Visit: Admitting: Nurse Practitioner

## 2024-04-01 ENCOUNTER — Telehealth: Payer: Self-pay

## 2024-04-01 ENCOUNTER — Other Ambulatory Visit (HOSPITAL_COMMUNITY): Payer: Self-pay

## 2024-04-01 ENCOUNTER — Other Ambulatory Visit: Payer: Self-pay | Admitting: Nurse Practitioner

## 2024-04-01 DIAGNOSIS — F411 Generalized anxiety disorder: Secondary | ICD-10-CM

## 2024-04-01 NOTE — Telephone Encounter (Signed)
 Pharmacy Patient Advocate Encounter   Received notification from CoverMyMeds that prior authorization for Ozempic  (0.25 or 0.5 MG/DOSE) 2MG /3ML pen-injectors is required/requested.   Insurance verification completed.   The patient is insured through [employer] .   Per test claim: PA required; PA submitted to above mentioned insurance via CoverMyMeds Key/confirmation #/EOC [id number] Status is pending

## 2024-04-01 NOTE — Telephone Encounter (Signed)
 Pharmacy Patient Advocate Encounter  Received notification from [employer] that Prior Authorization for Ozempic  (0.25 or 0.5 MG/DOSE) 2MG /3ML pen-injectors  has been APPROVED from 03/02/24 to 04/01/25. Ran test claim, Copay is $24.99. This test claim was processed through [facility]- copay amounts may vary at other pharmacies due to pharmacy/plan contracts, or as the patient moves through the different stages of their insurance plan.   PA #/Case ID/Reference #: [id number]

## 2024-04-12 ENCOUNTER — Ambulatory Visit (INDEPENDENT_AMBULATORY_CARE_PROVIDER_SITE_OTHER): Admitting: Nurse Practitioner

## 2024-04-12 ENCOUNTER — Encounter: Payer: Self-pay | Admitting: Nurse Practitioner

## 2024-04-12 VITALS — BP 124/76 | HR 71 | Temp 97.9°F | Ht 68.0 in | Wt 197.0 lb

## 2024-04-12 DIAGNOSIS — Z720 Tobacco use: Secondary | ICD-10-CM

## 2024-04-12 DIAGNOSIS — I10 Essential (primary) hypertension: Secondary | ICD-10-CM

## 2024-04-12 DIAGNOSIS — Z853 Personal history of malignant neoplasm of breast: Secondary | ICD-10-CM

## 2024-04-12 DIAGNOSIS — J411 Mucopurulent chronic bronchitis: Secondary | ICD-10-CM | POA: Diagnosis not present

## 2024-04-12 DIAGNOSIS — F411 Generalized anxiety disorder: Secondary | ICD-10-CM

## 2024-04-12 DIAGNOSIS — N393 Stress incontinence (female) (male): Secondary | ICD-10-CM

## 2024-04-12 DIAGNOSIS — E782 Mixed hyperlipidemia: Secondary | ICD-10-CM

## 2024-04-12 DIAGNOSIS — K219 Gastro-esophageal reflux disease without esophagitis: Secondary | ICD-10-CM

## 2024-04-12 DIAGNOSIS — I25119 Atherosclerotic heart disease of native coronary artery with unspecified angina pectoris: Secondary | ICD-10-CM

## 2024-04-12 DIAGNOSIS — E1165 Type 2 diabetes mellitus with hyperglycemia: Secondary | ICD-10-CM

## 2024-04-12 DIAGNOSIS — E669 Obesity, unspecified: Secondary | ICD-10-CM

## 2024-04-12 DIAGNOSIS — F3341 Major depressive disorder, recurrent, in partial remission: Secondary | ICD-10-CM

## 2024-04-12 DIAGNOSIS — D508 Other iron deficiency anemias: Secondary | ICD-10-CM

## 2024-04-12 LAB — BAYER DCA HB A1C WAIVED: HB A1C (BAYER DCA - WAIVED): 8.1 % — ABNORMAL HIGH (ref 4.8–5.6)

## 2024-04-12 MED ORDER — LORAZEPAM 0.5 MG PO TABS: 0.5000 mg | ORAL_TABLET | Freq: Three times a day (TID) | ORAL | 1 refills | Status: DC | PRN

## 2024-04-12 MED ORDER — OZEMPIC (0.25 OR 0.5 MG/DOSE) 2 MG/3ML ~~LOC~~ SOPN: 0.5000 mg | PEN_INJECTOR | SUBCUTANEOUS | 5 refills | Status: DC

## 2024-04-12 MED ORDER — METOPROLOL TARTRATE 50 MG PO TABS: 50.0000 mg | ORAL_TABLET | Freq: Two times a day (BID) | ORAL | 5 refills | Status: DC

## 2024-04-12 MED ORDER — MAGNESIUM OXIDE 400 MG PO TABS: 800.0000 mg | ORAL_TABLET | Freq: Two times a day (BID) | ORAL | 1 refills | Status: DC

## 2024-04-12 MED ORDER — AMLODIPINE BESYLATE 10 MG PO TABS: 10.0000 mg | ORAL_TABLET | Freq: Every day | ORAL | 1 refills | Status: DC

## 2024-04-12 MED ORDER — METFORMIN HCL 1000 MG PO TABS: 1000.0000 mg | ORAL_TABLET | Freq: Two times a day (BID) | ORAL | 1 refills | Status: DC

## 2024-04-12 MED ORDER — ESOMEPRAZOLE MAGNESIUM 40 MG PO CPDR: 40.0000 mg | DELAYED_RELEASE_CAPSULE | Freq: Every day | ORAL | 1 refills | Status: DC

## 2024-04-12 MED ORDER — FLUOXETINE HCL 20 MG PO CAPS: 20.0000 mg | ORAL_CAPSULE | Freq: Every day | ORAL | 3 refills | Status: DC

## 2024-04-12 NOTE — Progress Notes (Signed)
 Subjective:    Patient ID: Brenda Cruz, female    DOB: [birth year], 58 y.o.   MRN: [id number]   Chief Complaint: medical management of chronic issues     HPI:  Brenda Cruz is a 58 y.o. who identifies as a female who was assigned female at birth.   Social history: Lives with: husband and son Work history: changing jobs   Comes in today for follow up of the following chronic medical issues:  1. Essential hypertension No c/o chest pain of headaches. Has had lots of SOB lately but has been really sick wit upper respiratory infection. She is not taking her blood pressure meds like she is suppose to. The azor  make sher feel funny and she wants to go back to just amlodipine .  BP Readings from Last 3 Encounters:  04/12/24 124/76  03/18/24 (!) 169/87  12/28/23 (!) 155/84      2. Coronary artery disease involving native coronary artery of native heart with angina pectoris [facility]) Patient has not seen her cardiologist in over a year.  3. Mucopurulent chronic bronchitis (HCC) Has COPD from smoking  4. Gastroesophageal reflux disease without esophagitis Is on nexium  daily. Usually helps with any symptoms  5. Type 2 diabetes mellitus with hyperglycemia, without long-term current use of insulin  (HCC) Her blood sugars have been crazy. Wants to go back on ozempic . Lab Results  Component Value Date   HGBA1C 8.8 (H) 12/28/2023      6. Mixed hyperlipidemia Does not watch diet and does no exercise at all. Lab Results  Component Value Date   CHOL 280 (H) 12/28/2023   HDL 26 (L) 12/28/2023   LDLCALC 152 (H) 12/28/2023   TRIG 529 (H) 12/28/2023   CHOLHDL 10.8 (H) 12/28/2023     7. Stress incontinence of urine No better. Every time she coughs she wets  in her pants  8. Recurrent major depressive disorder, in partial remission (HCC) Currently not on any meds. Would like to go back on prozac .    04/12/2024    9:26 AM 12/28/2023    8:33 AM 12/21/2023    3:44 PM   Depression screen PHQ 2/9  Decreased Interest 0 0 0  Down, Depressed, Hopeless 0 0 1  PHQ - 2 Score 0 0 1  Altered sleeping 2  2  Tired, decreased energy 2  1  Change in appetite 2  0  Feeling bad or failure about yourself  1  0  Trouble concentrating 1  0  Moving slowly or fidgety/restless 0  0  Suicidal thoughts 0  0  PHQ-9 Score 8  4  Difficult doing work/chores Not difficult at all  Not difficult at all      9. GAD (generalized anxiety disorder) Takes ativan  3x a day.    04/12/2024    9:26 AM 12/21/2023    3:44 PM 08/14/2023   12:15 PM 08/03/2023    2:44 PM  GAD 7 : Generalized Anxiety Score  Nervous, Anxious, on Edge 1 1 1 2   Control/stop worrying 1 1 1  0  Worry too much - different things 1 0 0 0  Trouble relaxing 2 2 1 3   Restless 1 0 0 0  Easily annoyed or irritable 1 0 0 0  Afraid - awful might happen 0 0 0 0  Total GAD 7 Score 7 4 3 5   Anxiety Difficulty Somewhat difficult Somewhat difficult Somewhat difficult Somewhat difficult         10. Iron  deficiency anemia secondary to inadequate dietary iron intake Is very fatigued Lab Results  Component Value Date   HGB 12.5 03/18/2024     11. 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) Had bil breast us  on 09/01/22 and was clear.  12. Tobacco abuse Still smokes over a pack a day.   13. 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   Iohexol  Itching, Rash and Other  (See Comments)    Desc: White blisters in mouth during ivp in [place] '93, ok w/ 13 hour prep today//a.calhoun, Onset Date: 10/30/1991    Iodinated Contrast Media Itching and Rash   Penicillins Itching and Rash    Has patient had a PCN reaction causing immediate rash, facial/tongue/throat swelling, SOB or lightheadedness with hypotension: Yes Has patient had a PCN reaction causing severe rash involving mucus membranes or skin necrosis: No Has patient had a PCN reaction that required hospitalization: No Has patient had a PCN reaction occurring within the last 10 years: No If all of the above answers are NO, then may proceed with Cephalosporin use.   Sulfa Antibiotics Itching, Rash and Other (See Comments)   Outpatient Encounter Medications as of 04/12/2024  Medication Sig   albuterol  (VENTOLIN  HFA) 108 (90 Base) MCG/ACT inhaler Inhale 2 puffs into the lungs every 4 (four) hours as needed for wheezing or shortness of breath.   amLODipine -olmesartan  (AZOR ) 5-20 MG tablet Take 1 tablet by mouth daily.   blood glucose meter kit and supplies Dispense based on patient and insurance preference. Use up to four times daily as directed. (FOR ICD-10 E10.9, E11.9).   Budeson-Glycopyrrol-Formoterol  (BREZTRI AEROSPHERE) 160-9-4.8 MCG/ACT AERO Inhale into the lungs.   Calcium  Carb-Cholecalciferol (CALCIUM  600 + D PO) Take 1 tablet by mouth daily.   cetirizine  (ZYRTEC ) 10 MG tablet TAKE ONE (1) TABLET BY MOUTH EVERY DAY   clobetasol  cream (TEMOVATE ) 0.05 % Apply 1 Application topically 2 (two) times daily.   cyclobenzaprine  (FLEXERIL ) 10 MG tablet TAKE ONE TABLET BY MOUTH THREE TIMES DAILY AS NEEDED FOR MUSCLE SPASM   diphenhydrAMINE  (BENADRYL ) 50 MG tablet Take 1 tablet (50 mg total) by mouth once for 1 dose. Take on date/time instructed by prescribing provider.  Take within 1 hour of contrast injection. Call [phone] for questions.   esomeprazole  (NEXIUM ) 40 MG capsule TAKE ONE CAPSULE BY MOUTH  DAILY   famotidine  (PEPCID ) 40 MG tablet Take 1 tablet (40 mg total) by mouth at bedtime.   fluconazole  (DIFLUCAN ) 150 MG tablet 1 po q week x 4 weeks   fluticasone  (FLONASE ) 50 MCG/ACT nasal spray Place 2 sprays into both nostrils daily. (Patient taking differently: Place 2 sprays into both nostrils daily as needed for allergies.)   ibuprofen  (ADVIL ) 200 MG tablet Take 400 mg by mouth every 6 (six) hours as needed for moderate pain.   ipratropium-albuterol  (DUONEB) 0.5-2.5 (3) MG/3ML SOLN Take  3 mLs by nebulization every 6 (six) hours as needed (Foir shortness of breath).   LORazepam  (ATIVAN ) 0.5 MG tablet Take 1 tablet (0.5 mg total) by mouth every 8 (eight) hours as needed. for anxiety   magnesium  oxide (MAG-OX) 400 MG tablet Take 2 tablets (800 mg total) by mouth 2 (two) times daily.   metFORMIN  (GLUCOPHAGE ) 1000 MG tablet TAKE ONE (1) TABLET BY MOUTH TWO (2) TIMES DAILY   metoprolol  tartrate (LOPRESSOR ) 50 MG tablet Take 1 tablet (50 mg total) by mouth 2 (two) times daily.   nitroGLYCERIN  (NITROSTAT ) 0.4 MG SL tablet Place 1 tablet (0.4 mg total) under the tongue every 5 (five) minutes as needed for chest pain.   ondansetron  (ZOFRAN -ODT) 8 MG disintegrating tablet TAKE 1 TABLET BY MOUTH EVERY 6 HOURS AS NEEDED FOR NAUSEA & VOMITING   Semaglutide ,0.25 or 0.5MG /DOS, 2 MG/3ML SOPN Inject 0.25 mg into the skin once a week.   Vitamin D , Ergocalciferol , (DRISDOL ) 1.25 MG (50000 UNIT) CAPS capsule Take 1 capsule (50,000 Units total) by mouth every 7 (seven) days.   No facility-administered encounter medications on file as of 04/12/2024.    Past Surgical History:  Procedure Laterality Date   CARDIAC CATHETERIZATION N/A 10/29/2015   Procedure: Left Heart Cath and Coronary Angiography;  Surgeon: Dorn JINNY Lesches, MD;  Location: [facility] INVASIVE CV LAB;  Service: Cardiovascular;  Laterality: N/A;   CHOLECYSTECTOMY     CORONARY ARTERY BYPASS GRAFT N/A 11/02/2015   Procedure: CORONARY ARTERY BYPASS GRAFTING  (CABG)x 1 using left internal mammary artery.;  Surgeon: Elspeth JAYSON Millers, MD;  Location: [facility] OR;  Service: Open Heart Surgery;  Laterality: N/A;   LEFT OOPHORECTOMY Left    Related to tubal torsion.  Bilateral salpingectomy also.  Remaining uterus and right ovary   LUMBAR FUSION     TEE WITHOUT CARDIOVERSION N/A 11/02/2015   Procedure: TRANSESOPHAGEAL ECHOCARDIOGRAM (TEE);  Surgeon: Elspeth JAYSON Millers, MD;  Location: [facility] OR;  Service: Open Heart Surgery;  Laterality: N/A;   XI ROBOTIC ASSISTED VENTRAL HERNIA N/A 01/11/2023   Procedure: XI ROBOTIC ASSISTED VENTRAL HERNIA REPAIR W/ MESH- SUPRAUMBILICAL;  Surgeon: [name], DO;  Location: [facility] ORS;  Service: General;  Laterality: N/A;    Family History  Problem Relation Age of Onset   Breast cancer Mother    Colon polyps Father    Esophageal cancer Maternal Grandfather    Stomach cancer Paternal Grandmother    Ovarian cancer Other        Maternal Side Great  Aunt   Colon cancer Neg Hx    Rectal cancer Neg Hx       Controlled substance contract: 08/30/22  Hgba1c 9.0   Review of Systems  Constitutional:  Negative for diaphoresis.  Eyes:  Negative for pain.  Respiratory:  Positive for cough, shortness of breath and wheezing.   Cardiovascular:  Negative for chest pain, palpitations and leg swelling.  Gastrointestinal:  Negative for abdominal pain.  Endocrine: Negative for polydipsia.  Skin:  Negative for rash.  Neurological:  Negative for dizziness, weakness and headaches.  Hematological:  Does not bruise/bleed easily.  All other systems reviewed and are negative.      Objective:   Physical Exam Vitals and nursing note reviewed.  Constitutional:      General: She is not in acute distress.    Appearance: Normal appearance. She is well-developed.  HENT:     Head: Normocephalic.     Right Ear: Tympanic membrane normal.  Left Ear: Tympanic membrane normal.     Nose: Nose normal.     Mouth/Throat:      Mouth: Mucous membranes are moist.   Eyes:     Pupils: Pupils are equal, round, and reactive to light.   Neck:     Vascular: No carotid bruit or JVD.   Cardiovascular:     Rate and Rhythm: Normal rate and regular rhythm.     Heart sounds: Normal heart sounds.  Pulmonary:     Effort: Pulmonary effort is normal. No respiratory distress.     Breath sounds: Wheezing (exp throughout) present. No rales.  Chest:     Chest wall: No tenderness.  Abdominal:     General: Bowel sounds are normal. There is no distension or abdominal bruit.     Palpations: Abdomen is soft. There is no hepatomegaly, splenomegaly, mass or pulsatile mass.     Tenderness: There is no abdominal tenderness.   Musculoskeletal:        General: Normal range of motion.     Cervical back: Normal range of motion and neck supple.  Lymphadenopathy:     Cervical: No cervical adenopathy.   Skin:    General: Skin is warm and dry.   Neurological:     Mental Status: She is alert and oriented to person, place, and time.     Deep Tendon Reflexes: Reflexes are normal and symmetric.   Psychiatric:        Behavior: Behavior normal.        Thought Content: Thought content normal.        Judgment: Judgment normal.    BP 124/76   Pulse 71   Temp 97.9 F (36.6 C) (Temporal)   Ht 5' 8 (1.727 m)   Wt 197 lb (89.4 kg)   LMP 06/03/2017 (Exact Date)   BMI 29.95 kg/m     LMP 06/03/2017 (Exact Date)   Hgba1c 8.1%    Assessment & Plan:  Brenda Cruz comes in today with chief complaint of Medical Management of Chronic Issues   Diagnosis and orders addressed:  1. Essential hypertension (Primary) Dash diet Changed to amlodipine  10mg  daily Keep diary of blood pressure - CBC with Differential/Platelet - CMP14+EGFR - amLODipine  (NORVASC ) 10 MG tablet; Take 1 tablet (10 mg total) by mouth daily.  Dispense: 90 tablet; Refill: 1 - metoprolol  tartrate (LOPRESSOR ) 50 MG tablet; Take 1 tablet (50 mg total) by mouth 2  (two) times daily.  Dispense: 60 tablet; Refill: 5  2. Coronary artery disease involving native coronary artery of native heart with angina pectoris (HCC) Keep follow up with cardiology - magnesium  oxide (MAG-OX) 400 MG tablet; Take 2 tablets (800 mg total) by mouth 2 (two) times daily.  Dispense: 180 tablet; Refill: 1  3. Mucopurulent chronic bronchitis (HCC)  4. Gastroesophageal reflux disease without esophagitis Avoid spicy foods Do not eat 2 hours prior to bedtime - esomeprazole  (NEXIUM ) 40 MG capsule; Take 1 capsule (40 mg total) by mouth daily.  Dispense: 90 capsule; Refill: 1  5. Mixed hyperlipidemia Low fat diet - Lipid panel  6. Stress incontinence of urine Continue meds  7. Recurrent major depressive disorder, in partial remission (HCC) Start back on prozac  - FLUoxetine  (PROZAC ) 20 MG capsule; Take 1 capsule (20 mg total) by mouth daily.  Dispense: 90 capsule; Refill: 3  8. GAD (generalized anxiety disorder) Stress management - LORazepam  (ATIVAN ) 0.5 MG tablet; Take 1 tablet (0.5 mg total) by mouth every 8 (eight) hours as  needed. for anxiety  Dispense: 90 tablet; Refill: 1  9. Iron deficiency anemia secondary to inadequate dietary iron intake Labs pending  10. History of infiltrating ductal carcinoma of breast  11. Tobacco abuse Smoking cessation  12. Obesity with body mass index of 30.0-39.9 Discussed diet and exercise for person with BMI >25 Will recheck weight in 3-6 months   13. Type 2 diabetes mellitus with hyperglycemia, without long-term current use of insulin  (HCC) Added ozempic  back to meds - Bayer DCA Hb A1c Waived - Vitamin B12 - Semaglutide ,0.25 or 0.5MG /DOS, (OZEMPIC , 0.25 OR 0.5 MG/DOSE,) 2 MG/3ML SOPN; Inject 0.5 mg into the skin once a week.  Dispense: 3 mL; Refill: 5 - metFORMIN  (GLUCOPHAGE ) 1000 MG tablet; Take 1 tablet (1,000 mg total) by mouth 2 (two) times daily with a meal.  Dispense: 180 tablet; Refill: 1   Labs pending Health  Maintenance reviewed Diet and exercise encouraged  Follow up plan: 3 months   [name], [name]

## 2024-04-12 NOTE — Patient Instructions (Signed)

## 2024-04-12 NOTE — Addendum Note (Signed)
 Addended by: [name] on: 04/12/2024 10:31 AM   Modules accepted: Orders

## 2024-04-13 LAB — CBC WITH DIFFERENTIAL/PLATELET
Basophils Absolute: 0.1 10*3/uL (ref 0.0–0.2)
Basos: 1 %
EOS (ABSOLUTE): 0.3 10*3/uL (ref 0.0–0.4)
Eos: 4 %
Hematocrit: 40.1 % (ref 34.0–46.6)
Hemoglobin: 13 g/dL (ref 11.1–15.9)
Immature Grans (Abs): 0 10*3/uL (ref 0.0–0.1)
Immature Granulocytes: 0 %
Lymphocytes Absolute: 2.2 10*3/uL (ref 0.7–3.1)
Lymphs: 28 %
MCH: 30.2 pg (ref 26.6–33.0)
MCHC: 32.4 g/dL (ref 31.5–35.7)
MCV: 93 fL (ref 79–97)
Monocytes Absolute: 0.7 10*3/uL (ref 0.1–0.9)
Monocytes: 8 %
Neutrophils Absolute: 4.7 10*3/uL (ref 1.4–7.0)
Neutrophils: 58 %
Platelets: 232 10*3/uL (ref 150–450)
RBC: 4.3 x10E6/uL (ref 3.77–5.28)
RDW: 12.5 % (ref 11.7–15.4)
WBC: 8 10*3/uL (ref 3.4–10.8)

## 2024-04-13 LAB — CMP14+EGFR
ALT: 36 IU/L — ABNORMAL HIGH (ref 0–32)
AST: 50 IU/L — ABNORMAL HIGH (ref 0–40)
Albumin: 4.2 g/dL (ref 3.8–4.9)
Alkaline Phosphatase: 87 IU/L (ref 44–121)
BUN/Creatinine Ratio: 10 (ref 9–23)
BUN: 11 mg/dL (ref 6–24)
Bilirubin Total: 0.3 mg/dL (ref 0.0–1.2)
CO2: 21 mmol/L (ref 20–29)
Calcium: 9 mg/dL (ref 8.7–10.2)
Chloride: 101 mmol/L (ref 96–106)
Creatinine, Ser: 1.08 mg/dL — ABNORMAL HIGH (ref 0.57–1.00)
Globulin, Total: 2.6 g/dL (ref 1.5–4.5)
Glucose: 195 mg/dL — ABNORMAL HIGH (ref 70–99)
Potassium: 4.6 mmol/L (ref 3.5–5.2)
Sodium: 137 mmol/L (ref 134–144)
Total Protein: 6.8 g/dL (ref 6.0–8.5)
eGFR: 61 mL/min/{1.73_m2} (ref >59)

## 2024-04-13 LAB — VITAMIN B12: Vitamin B-12: 679 pg/mL (ref 232–1245)

## 2024-04-13 LAB — LIPID PANEL
Chol/HDL Ratio: 10.5 ratio — ABNORMAL HIGH (ref 0.0–4.4)
Cholesterol, Total: 304 mg/dL — ABNORMAL HIGH (ref 100–199)
HDL: 29 mg/dL — ABNORMAL LOW (ref >39)
LDL Chol Calc (NIH): 203 mg/dL — ABNORMAL HIGH (ref 0–99)
Triglycerides: 349 mg/dL — ABNORMAL HIGH (ref 0–149)
VLDL Cholesterol Cal: 72 mg/dL — ABNORMAL HIGH (ref 5–40)

## 2024-04-15 ENCOUNTER — Other Ambulatory Visit: Payer: Self-pay

## 2024-04-15 ENCOUNTER — Ambulatory Visit: Payer: Self-pay | Admitting: Nurse Practitioner

## 2024-04-15 DIAGNOSIS — I10 Essential (primary) hypertension: Secondary | ICD-10-CM

## 2024-04-15 MED ORDER — METOPROLOL TARTRATE 50 MG PO TABS: 50.0000 mg | ORAL_TABLET | Freq: Two times a day (BID) | ORAL | 5 refills | Status: DC

## 2024-05-13 ENCOUNTER — Other Ambulatory Visit (HOSPITAL_COMMUNITY): Payer: Self-pay

## 2024-05-14 ENCOUNTER — Telehealth: Payer: Self-pay | Admitting: Pharmacy Technician

## 2024-05-14 NOTE — Telephone Encounter (Signed)
 SABRA

## 2024-06-10 ENCOUNTER — Other Ambulatory Visit: Payer: Self-pay | Admitting: Nurse Practitioner

## 2024-06-10 DIAGNOSIS — F411 Generalized anxiety disorder: Secondary | ICD-10-CM

## 2024-07-02 ENCOUNTER — Ambulatory Visit: Admitting: Nurse Practitioner

## 2024-07-24 ENCOUNTER — Ambulatory Visit: Payer: Self-pay

## 2024-07-24 NOTE — Telephone Encounter (Signed)
 FYI Only or Action Required?: Action required by provider: request for appointment.  Patient was last seen in primary care on 04/12/2024 by [name], [name].  Called Nurse Triage reporting Sinusitis.  Symptoms began a week ago.  Interventions attempted: tylenol .  Symptoms are: gradually worsening.  Triage Disposition: See Physician Within 24 Hours  Patient/caregiver understands and will follow disposition?: YesCopied from CRM #[id number]. Topic: Clinical - Red Word Triage >> Jul 24, 2024  2:02 PM Roselie BROCKS wrote: Red Word that prompted transfer to Nurse Triage: Patient has respiratory infection, productive coughing,fever and severe headache Reason for Disposition  [1] Sinus pain (not just congestion) AND [2] fever  Answer Assessment - Initial Assessment Questions Sinus pressure pain in head. Productive cough-dark yellow sputum. Pt was seen at [facility] recently and on doxy. Pt has finished abx and sympotms have returned and are getting worse. Fever 100.2 last night. Pt took tylenol .  Pt uses inhaler has needed. Pt denies breathing difficulty.      1. LOCATION: Where does it hurt?      head 2. ONSET: When did the sinus pain start?  (e.g., hours, days)      Over a week ago 3. SEVERITY: How bad is the pain?   (Scale 0-10; or none, mild, moderate or severe)     4 4. RECURRENT SYMPTOM: Have you ever had sinus problems before? If Yes, ask: When was the last time? and What happened that time?      yes 5. NASAL CONGESTION: Is the nose blocked? If Yes, ask: Can you open it or must you breathe through your mouth?     denies 6. NASAL DISCHARGE: Do you have discharge from your nose? If so ask, What color?    varies 7. FEVER: Do you have a fever? If Yes, ask: What is it, how was it measured, and when did it start?      100.2 8. OTHER SYMPTOMS: Do you have any other symptoms? (e.g., sore throat, cough, earache, difficulty breathing)     Cough, left ear  pain  Protocols used: Sinus Pain or Congestion-A-AH

## 2024-07-25 ENCOUNTER — Encounter: Payer: Self-pay | Admitting: Family Medicine

## 2024-07-25 ENCOUNTER — Ambulatory Visit (INDEPENDENT_AMBULATORY_CARE_PROVIDER_SITE_OTHER): Payer: Self-pay | Admitting: Family Medicine

## 2024-07-25 VITALS — BP 166/86 | HR 76 | Temp 98.1°F | Ht 68.0 in | Wt 202.6 lb

## 2024-07-25 DIAGNOSIS — I152 Hypertension secondary to endocrine disorders: Secondary | ICD-10-CM

## 2024-07-25 DIAGNOSIS — R509 Fever, unspecified: Secondary | ICD-10-CM

## 2024-07-25 DIAGNOSIS — J411 Mucopurulent chronic bronchitis: Secondary | ICD-10-CM

## 2024-07-25 DIAGNOSIS — J0100 Acute maxillary sinusitis, unspecified: Secondary | ICD-10-CM | POA: Diagnosis not present

## 2024-07-25 DIAGNOSIS — E1159 Type 2 diabetes mellitus with other circulatory complications: Secondary | ICD-10-CM

## 2024-07-25 DIAGNOSIS — R110 Nausea: Secondary | ICD-10-CM | POA: Diagnosis not present

## 2024-07-25 DIAGNOSIS — E1165 Type 2 diabetes mellitus with hyperglycemia: Secondary | ICD-10-CM | POA: Diagnosis not present

## 2024-07-25 LAB — URINALYSIS, ROUTINE W REFLEX MICROSCOPIC
Bilirubin, UA: NEGATIVE
Ketones, UA: NEGATIVE
Leukocytes,UA: NEGATIVE
Nitrite, UA: NEGATIVE
Protein,UA: NEGATIVE
RBC, UA: NEGATIVE
Specific Gravity, UA: <=1.005 — AB (ref 1.005–1.030)
Urobilinogen, Ur: 0.2 mg/dL (ref 0.2–1.0)
pH, UA: 5.5 (ref 5.0–7.5)

## 2024-07-25 LAB — GLUCOSE HEMOCUE WAIVED: Glu Hemocue Waived: 198 mg/dL — ABNORMAL HIGH (ref 70–99)

## 2024-07-25 MED ORDER — CEFDINIR 300 MG PO CAPS: 300.0000 mg | ORAL_CAPSULE | Freq: Two times a day (BID) | ORAL | 0 refills | Status: DC

## 2024-07-25 NOTE — Progress Notes (Signed)
 Acute Office Visit  Subjective:     Patient ID: Brenda Cruz, female    DOB: 19-Dec-1965, 58 y.o.   MRN: [id number]  Chief Complaint  Patient presents with   Sinusitis    HPI   History of Present Illness   Brenda Cruz is a 58 year old female with type 2 diabetes who presents with recurrent sinusitis symptoms.  Upper respiratory symptoms - Recurrent sinusitis symptoms since approximately July 06, 2024 - Initial presentation to urgent care on July 13, 2024, with one week of symptoms - Treated with doxycycline  and prednisone  - Negative testing for COVID, influenza, RSV, and strep - Initial mild improvement, followed by worsening symptoms over the last several days - Fever of 100.44F last night, improved with Tylenol  or Advil  - Persistent cough with thick, dark yellow sputum - Sinus pressure and nasal congestion - Headaches located on the side of the head, not in the sinus area - Left ear pain with fluid accumulation  Lower respiratory symptoms - Shortness of breath - Use of albuterol  inhaler and home nebulizer - Wheezing previously noted at urgent care, now improved - Chest discomfort initially associated with coughing, now constant  Gastrointestinal symptoms - Nausea present - No vomiting, abdominal pain, diarrhea, or constipation  Genitourinary symptoms - Frothy urine - Right-sided low back pain - No dysuria, urgency, frequency, hesitancy, retention, or hematuria  Glycemic control - Type 2 diabetes mellitus - Recent blood glucose of 198 mg/dL (non-fasting)       ROS As per HPI.      Objective:    BP (!) 166/86   Pulse 76   Temp 98.1 F (36.7 C) (Temporal)   Ht 5' 8 (1.727 m)   Wt 202 lb 9.6 oz (91.9 kg)   LMP 06/03/2017 (Exact Date)   SpO2 98%   BMI 30.81 kg/m  BP Readings from Last 3 Encounters:  07/25/24 (!) 166/86  04/12/24 124/76  03/18/24 (!) 169/87      Physical Exam Vitals and nursing note reviewed.  Constitutional:       General: She is not in acute distress.    Appearance: She is ill-appearing. She is not toxic-appearing or diaphoretic.  HENT:     Head: Normocephalic and atraumatic.     Right Ear: Ear canal and external ear normal. A middle ear effusion is present. Tympanic membrane is not erythematous, retracted or bulging.     Left Ear: Ear canal and external ear normal. A middle ear effusion is present. Tympanic membrane is not erythematous, retracted or bulging.     Nose: Congestion present.     Right Sinus: Maxillary sinus tenderness present. No frontal sinus tenderness.     Left Sinus: Maxillary sinus tenderness present. No frontal sinus tenderness.     Mouth/Throat:     Pharynx: Posterior oropharyngeal erythema present. No pharyngeal swelling, oropharyngeal exudate, uvula swelling or postnasal drip.     Tonsils: No tonsillar exudate or tonsillar abscesses. 1+ on the right. 1+ on the left.  Eyes:     General:        Right eye: No discharge.        Left eye: No discharge.     Conjunctiva/sclera: Conjunctivae normal.  Cardiovascular:     Rate and Rhythm: Normal rate and regular rhythm.     Heart sounds: Normal heart sounds. No murmur heard. Pulmonary:     Effort: Pulmonary effort is normal. No respiratory distress.     Breath sounds: Normal breath  sounds. No wheezing, rhonchi or rales.  Abdominal:     General: Bowel sounds are normal. There is no distension.     Palpations: Abdomen is soft.     Tenderness: There is no abdominal tenderness. There is no right CVA tenderness, left CVA tenderness, guarding or rebound.  Musculoskeletal:     Cervical back: Neck supple. No rigidity.     Right lower leg: No edema.     Left lower leg: No edema.  Skin:    General: Skin is warm.  Neurological:     General: No focal deficit present.     Mental Status: She is alert and oriented to person, place, and time.  Psychiatric:        Mood and Affect: Mood normal.        Behavior: Behavior normal.     Urine dipstick shows positive for trace glucose.   Glucose: 198    Assessment & Plan:   Brenda Cruz was seen today for sinusitis.  Diagnoses and all orders for this visit:  Acute non-recurrent maxillary sinusitis -     cefdinir  (OMNICEF ) 300 MG capsule; Take 1 capsule (300 mg total) by mouth 2 (two) times daily. 1 po BID  Fever, unspecified fever cause -     Urinalysis, Routine w reflex microscopic  Nausea  Type 2 diabetes mellitus with hyperglycemia, without long-term current use of insulin  (HCC) -     Glucose Hemocue Waived  Hypertension associated with diabetes (HCC)  Mucopurulent chronic bronchitis (HCC)   Will treat with omnicef  for acute bacterial sinusitis, though discussed symptoms may be a new viral illness. She will take a rapid home Covid/flu test and notify for any positive results. Denies wheezing. Lungs clear on exam. Will hold off on additional prednisone  given uncontrolled T2DM. UA negative for UTI. Nonfasting blood sugar on 198. No ketones in urine. Increase hydration. BP is uncontrolled today. Follow up with PCP regarding BP management. Discussed symptomatic care and return precautions.   Return to office for new or worsening symptoms, or if symptoms persist.   The patient indicates understanding of these issues and agrees with the plan.  Brenda CHRISTELLA Search, [name]

## 2024-08-15 ENCOUNTER — Encounter: Payer: Self-pay | Admitting: Nurse Practitioner

## 2024-08-15 ENCOUNTER — Ambulatory Visit: Admitting: Nurse Practitioner

## 2024-08-23 ENCOUNTER — Ambulatory Visit (INDEPENDENT_AMBULATORY_CARE_PROVIDER_SITE_OTHER): Payer: Self-pay | Admitting: Nurse Practitioner

## 2024-08-23 ENCOUNTER — Encounter: Payer: Self-pay | Admitting: Nurse Practitioner

## 2024-08-23 VITALS — BP 182/88 | HR 70 | Temp 97.2°F | Ht 68.0 in | Wt 200.0 lb

## 2024-08-23 DIAGNOSIS — E1169 Type 2 diabetes mellitus with other specified complication: Secondary | ICD-10-CM

## 2024-08-23 DIAGNOSIS — K219 Gastro-esophageal reflux disease without esophagitis: Secondary | ICD-10-CM

## 2024-08-23 DIAGNOSIS — I10 Essential (primary) hypertension: Secondary | ICD-10-CM

## 2024-08-23 DIAGNOSIS — Z720 Tobacco use: Secondary | ICD-10-CM

## 2024-08-23 DIAGNOSIS — D508 Other iron deficiency anemias: Secondary | ICD-10-CM

## 2024-08-23 DIAGNOSIS — J411 Mucopurulent chronic bronchitis: Secondary | ICD-10-CM | POA: Diagnosis not present

## 2024-08-23 DIAGNOSIS — E1165 Type 2 diabetes mellitus with hyperglycemia: Secondary | ICD-10-CM

## 2024-08-23 DIAGNOSIS — N393 Stress incontinence (female) (male): Secondary | ICD-10-CM

## 2024-08-23 DIAGNOSIS — I25119 Atherosclerotic heart disease of native coronary artery with unspecified angina pectoris: Secondary | ICD-10-CM | POA: Diagnosis not present

## 2024-08-23 DIAGNOSIS — Z853 Personal history of malignant neoplasm of breast: Secondary | ICD-10-CM

## 2024-08-23 DIAGNOSIS — F411 Generalized anxiety disorder: Secondary | ICD-10-CM

## 2024-08-23 DIAGNOSIS — E669 Obesity, unspecified: Secondary | ICD-10-CM

## 2024-08-23 DIAGNOSIS — Z7984 Long term (current) use of oral hypoglycemic drugs: Secondary | ICD-10-CM

## 2024-08-23 DIAGNOSIS — F3341 Major depressive disorder, recurrent, in partial remission: Secondary | ICD-10-CM

## 2024-08-23 LAB — BAYER DCA HB A1C WAIVED: HB A1C (BAYER DCA - WAIVED): 9.2 % — ABNORMAL HIGH (ref 4.8–5.6)

## 2024-08-23 MED ORDER — MAGNESIUM OXIDE 400 MG PO TABS: 800.0000 mg | ORAL_TABLET | Freq: Two times a day (BID) | ORAL | 1 refills | Status: AC

## 2024-08-23 MED ORDER — LORAZEPAM 0.5 MG PO TABS: 0.5000 mg | ORAL_TABLET | Freq: Every day | ORAL | 1 refills | Status: AC

## 2024-08-23 MED ORDER — FLUOXETINE HCL 20 MG PO CAPS: 20.0000 mg | ORAL_CAPSULE | Freq: Every day | ORAL | 1 refills | Status: DC

## 2024-08-23 MED ORDER — ESOMEPRAZOLE MAGNESIUM 40 MG PO CPDR
40.0000 mg | DELAYED_RELEASE_CAPSULE | Freq: Every day | ORAL | 1 refills | Status: AC
Start: 2024-08-23 — End: ?

## 2024-08-23 MED ORDER — AMLODIPINE BESYLATE 10 MG PO TABS: 10.0000 mg | ORAL_TABLET | Freq: Every day | ORAL | 1 refills | Status: AC

## 2024-08-23 MED ORDER — SITAGLIPTIN PHOSPHATE 100 MG PO TABS: 100.0000 mg | ORAL_TABLET | Freq: Every day | ORAL | 1 refills | Status: DC

## 2024-08-23 MED ORDER — METOPROLOL TARTRATE 50 MG PO TABS: 50.0000 mg | ORAL_TABLET | Freq: Two times a day (BID) | ORAL | 1 refills | Status: DC

## 2024-08-23 MED ORDER — LORAZEPAM 0.5 MG PO TABS: 0.5000 mg | ORAL_TABLET | Freq: Every day | ORAL | 1 refills | Status: DC

## 2024-08-23 NOTE — Patient Instructions (Signed)

## 2024-08-23 NOTE — Progress Notes (Signed)
 Subjective:    Patient ID: Brenda Cruz, female    DOB: 18-Feb-1966, 58 y.o.   MRN: [id number]   Chief Complaint: medical management of chronic issues     HPI:  Brenda Cruz is a 58 y.o. who identifies as a female who was assigned female at birth.   Social history: Lives with: husband and son Work history: changing jobs   Comes in today for follow up of the following chronic medical issues:  1. Essential hypertension No c/o chest pain of headaches.  She is not taking her blood pressure meds like she is suppose to because she was not sure where diarrhea is coming from. BP Readings from Last 3 Encounters:  07/25/24 (!) 166/86  04/12/24 124/76  03/18/24 (!) 169/87      2. Coronary artery disease involving native coronary artery of native heart with angina pectoris [facility]) Patient has not seen her cardiologist in over a year.  3. Mucopurulent chronic bronchitis (HCC) Has COPD from smoking  4. Gastroesophageal reflux disease without esophagitis Is on nexium  daily. Usually helps with any symptoms  5. Type 2 diabetes mellitus with hyperglycemia, without long-term current use of insulin  (HCC) Her blood sugars have been crazy. She has lots of diarrhea from metformin  and would like to take something else. She has not been taking her metformin  Lab Results  Component Value Date   HGBA1C 8.1 (H) 04/12/2024     6. Mixed hyperlipidemia Does not watch diet and does no exercise at all. Lab Results  Component Value Date   CHOL 304 (H) 04/12/2024   HDL 29 (L) 04/12/2024   LDLCALC 203 (H) 04/12/2024   TRIG 349 (H) 04/12/2024   CHOLHDL 10.5 (H) 04/12/2024     7. Stress incontinence of urine No better. Every time she coughs she wets  in her pants  8. Recurrent major depressive disorder, in partial remission (HCC) Currently not on any meds. Would like to go back on prozac .    08/23/2024    4:03 PM 07/25/2024    3:35 PM 04/12/2024    9:26 AM  Depression screen PHQ 2/9   Decreased Interest 0 1 0  Down, Depressed, Hopeless 0 0 0  PHQ - 2 Score 0 1 0  Altered sleeping 1 2 2   Tired, decreased energy 2 2 2   Change in appetite 0 0 2  Feeling bad or failure about yourself  0 0 1  Trouble concentrating 0 0 1  Moving slowly or fidgety/restless 0 0 0  Suicidal thoughts 0 0 0  PHQ-9 Score 3 5  8    Difficult doing work/chores Not difficult at all Not difficult at all Not difficult at all     Data saved with a previous flowsheet row definition      9. GAD (generalized anxiety disorder) Takes ativan  3x a day.    08/23/2024    4:03 PM 07/25/2024    3:36 PM 04/12/2024    9:26 AM 12/21/2023    3:44 PM  GAD 7 : Generalized Anxiety Score  Nervous, Anxious, on Edge 0 1 1 1   Control/stop worrying 0 1 1 1   Worry too much - different things 0 0 1 0  Trouble relaxing 1 0 2 2  Restless 0 0 1 0  Easily annoyed or irritable 0 0 1 0  Afraid - awful might happen 0 0 0 0  Total GAD 7 Score 1 2 7 4   Anxiety Difficulty Not difficult at all Not difficult  at all Somewhat difficult Somewhat difficult    10. Iron deficiency anemia secondary to inadequate dietary iron intake Is very fatigued Lab Results  Component Value Date   HGB 13.0 04/12/2024     11. 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) Had bil breast us  on 09/01/22 and was clear.  12. Tobacco abuse Still smokes over a pack a day.   13. 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   Iohexol  Itching, Rash and Other (See Comments)    Desc: White blisters in mouth during ivp in [place] '93, ok w/ 13 hour prep today//a.calhoun, Onset Date: 10/30/1991    Iodinated Contrast Media Itching and Rash   Penicillins Itching and Rash    Has patient had a PCN reaction causing immediate rash, facial/tongue/throat swelling, SOB or lightheadedness with hypotension: Yes Has patient had a PCN reaction causing severe rash involving mucus membranes or skin necrosis: No Has patient had a PCN reaction that required hospitalization: No Has patient had a PCN reaction occurring within the last 10 years: No If all of the above answers are NO, then may proceed with Cephalosporin use.   Sulfa Antibiotics Itching, Rash and Other (See Comments)   Outpatient Encounter Medications as of 08/23/2024  Medication Sig   albuterol  (VENTOLIN  HFA) 108 (90 Base) MCG/ACT inhaler Inhale 2 puffs into the lungs every 4 (four) hours as needed for wheezing or shortness of breath.   amLODipine  (NORVASC ) 10 MG tablet Take 1 tablet (10 mg total) by mouth daily.   blood glucose meter kit and supplies Dispense based on patient and insurance preference. Use up to four times daily as directed. (FOR ICD-10 E10.9, E11.9).   Budeson-Glycopyrrol-Formoterol  (BREZTRI AEROSPHERE) 160-9-4.8 MCG/ACT AERO Inhale into the lungs.   Calcium  Carb-Cholecalciferol (CALCIUM  600 + D PO) Take 1 tablet by mouth daily.   cefdinir  (OMNICEF ) 300 MG capsule Take 1 capsule (300 mg total) by mouth 2 (two) times daily. 1 po BID   cetirizine  (ZYRTEC ) 10 MG tablet TAKE ONE (1) TABLET BY MOUTH EVERY DAY   clobetasol  cream (TEMOVATE ) 0.05 % Apply 1 Application topically 2 (two) times daily.   cyclobenzaprine  (FLEXERIL ) 10 MG tablet TAKE ONE TABLET BY MOUTH THREE TIMES DAILY AS NEEDED FOR MUSCLE SPASM   diphenhydrAMINE  (BENADRYL ) 50 MG tablet Take 1 tablet (50 mg total) by mouth once for 1 dose. Take on date/time  instructed by prescribing provider.  Take within 1 hour of contrast injection. Call [phone] for questions.   esomeprazole  (NEXIUM ) 40 MG capsule Take 1 capsule (40 mg total) by mouth daily.   FLUoxetine  (PROZAC ) 20 MG capsule Take 1 capsule (20 mg total) by mouth daily.   fluticasone  (FLONASE ) 50 MCG/ACT nasal spray Place 2 sprays into both nostrils daily.   ibuprofen  (ADVIL ) 200 MG tablet Take 400 mg by mouth every 6 (six) hours  as needed for moderate pain.   ipratropium-albuterol  (DUONEB) 0.5-2.5 (3) MG/3ML SOLN Take 3 mLs by nebulization every 6 (six) hours as needed (Foir shortness of breath).   LORazepam  (ATIVAN ) 0.5 MG tablet TAKE 1 TABLET EVERY 8 HOURS AS NEEDED FOR ANXIETY   magnesium  oxide (MAG-OX) 400 MG tablet Take 2 tablets (800 mg total) by mouth 2 (two) times daily.   metFORMIN  (GLUCOPHAGE ) 1000 MG tablet Take 1 tablet (1,000 mg total) by mouth 2 (two) times daily with a meal.   metoprolol  tartrate (LOPRESSOR ) 50 MG tablet Take 1 tablet (50 mg total) by mouth 2 (two) times daily.   nitroGLYCERIN  (NITROSTAT ) 0.4 MG SL tablet Place 1 tablet (0.4 mg total) under the tongue every 5 (five) minutes as needed for chest pain.   ondansetron  (ZOFRAN -ODT) 8 MG disintegrating tablet TAKE 1 TABLET BY MOUTH EVERY 6 HOURS AS NEEDED FOR NAUSEA & VOMITING   Vitamin D , Ergocalciferol , (DRISDOL ) 1.25 MG (50000 UNIT) CAPS capsule Take 1 capsule (50,000 Units total) by mouth every 7 (seven) days.   No facility-administered encounter medications on file as of 08/23/2024.    Past Surgical History:  Procedure Laterality Date   CARDIAC CATHETERIZATION N/A 10/29/2015   Procedure: Left Heart Cath and Coronary Angiography;  Surgeon: Dorn JINNY Lesches, MD;  Location: [facility] INVASIVE CV LAB;  Service: Cardiovascular;  Laterality: N/A;   CHOLECYSTECTOMY     CORONARY ARTERY BYPASS GRAFT N/A 11/02/2015   Procedure: CORONARY ARTERY BYPASS GRAFTING (CABG)x 1 using left internal mammary artery.;  Surgeon: Elspeth JAYSON Millers, MD;  Location: [facility] OR;  Service: Open Heart Surgery;  Laterality: N/A;   LEFT OOPHORECTOMY Left    Related to tubal torsion.  Bilateral salpingectomy also.  Remaining uterus and right ovary   LUMBAR FUSION     TEE WITHOUT CARDIOVERSION N/A 11/02/2015   Procedure: TRANSESOPHAGEAL ECHOCARDIOGRAM (TEE);  Surgeon: Elspeth JAYSON Millers, MD;  Location: [facility] OR;  Service: Open Heart Surgery;  Laterality: N/A;   XI ROBOTIC ASSISTED VENTRAL HERNIA N/A 01/11/2023   Procedure: XI ROBOTIC ASSISTED VENTRAL HERNIA REPAIR W/ MESH- SUPRAUMBILICAL;  Surgeon: [name], DO;  Location: [facility] ORS;  Service: General;  Laterality: N/A;    Family History  Problem Relation Age of Onset   Breast cancer Mother    Colon polyps Father    Esophageal cancer Maternal Grandfather    Stomach cancer Paternal Grandmother    Ovarian cancer Other        Maternal Side Great  Aunt   Colon cancer Neg Hx    Rectal cancer Neg Hx       Controlled substance contract: 08/30/22  Hgba1c 9.0   Review of Systems  Constitutional:  Negative for diaphoresis.  Eyes:  Negative for pain.  Respiratory:  Positive for cough, shortness of breath and wheezing.   Cardiovascular:  Negative for chest pain, palpitations and leg swelling.  Gastrointestinal:  Negative for abdominal pain.  Endocrine: Negative for polydipsia.  Skin:  Negative for rash.  Neurological:  Negative for dizziness, weakness and headaches.  Hematological:  Does not bruise/bleed easily.  All other systems reviewed and are negative.      Objective:   Physical Exam Vitals and nursing note reviewed.  Constitutional:      General: She is not in acute distress.    Appearance: Normal appearance. She is well-developed.  HENT:     Head: Normocephalic.     Right Ear: Tympanic membrane normal.     Left Ear: Tympanic membrane  normal.     Nose: Nose normal.     Mouth/Throat:     Mouth: Mucous membranes are moist.  Eyes:     Pupils: Pupils are  equal, round, and reactive to light.  Neck:     Vascular: No carotid bruit or JVD.  Cardiovascular:     Rate and Rhythm: Normal rate and regular rhythm.     Heart sounds: Normal heart sounds.  Pulmonary:     Effort: Pulmonary effort is normal. No respiratory distress.     Breath sounds: Wheezing (exp throughout) present. No rales.  Chest:     Chest wall: No tenderness.  Abdominal:     General: Bowel sounds are normal. There is no distension or abdominal bruit.     Palpations: Abdomen is soft. There is no hepatomegaly, splenomegaly, mass or pulsatile mass.     Tenderness: There is no abdominal tenderness.  Musculoskeletal:        General: Normal range of motion.     Cervical back: Normal range of motion and neck supple.  Lymphadenopathy:     Cervical: No cervical adenopathy.  Skin:    General: Skin is warm and dry.  Neurological:     Mental Status: She is alert and oriented to person, place, and time.     Deep Tendon Reflexes: Reflexes are normal and symmetric.  Psychiatric:        Behavior: Behavior normal.        Thought Content: Thought content normal.        Judgment: Judgment normal.    BP (!) 182/88   Pulse 70   Temp (!) 97.2 F (36.2 C) (Temporal)   Ht 5' 8 (1.727 m)   Wt 200 lb (90.7 kg)   LMP 06/03/2017 (Exact Date)   SpO2 98%   BMI 30.41 kg/m    LMP 06/03/2017 (Exact Date)   Hgba1c 9.2%    Assessment & Plan:  Brenda Cruz comes in today with chief complaint of medical management of chronic issues    Diagnosis and orders addressed:  1. Essential hypertension (Primary) Dash diet BACK ON BLOOD PRESSURE MEDS!!!! Changed to amlodipine  10mg  daily Keep diary of blood pressure - CBC with Differential/Platelet - CMP14+EGFR - amLODipine  (NORVASC ) 10 MG tablet; Take 1 tablet (10 mg total) by mouth daily.  Dispense: 90 tablet; Refill: 1 - metoprolol  tartrate (LOPRESSOR ) 50 MG tablet; Take 1 tablet (50 mg total) by mouth 2 (two) times daily.  Dispense: 60  tablet; Refill: 5  2. Coronary artery disease involving native coronary artery of native heart with angina pectoris (HCC) Keep follow up with cardiology - magnesium  oxide (MAG-OX) 400 MG tablet; Take 2 tablets (800 mg total) by mouth 2 (two) times daily.  Dispense: 180 tablet; Refill: 1  3. Mucopurulent chronic bronchitis (HCC)  4. Gastroesophageal reflux disease without esophagitis Avoid spicy foods Do not eat 2 hours prior to bedtime - esomeprazole  (NEXIUM ) 40 MG capsule; Take 1 capsule (40 mg total) by mouth daily.  Dispense: 90 capsule; Refill: 1  5. Mixed hyperlipidemia Low fat diet - Lipid panel  6. Stress incontinence of urine Continue meds  7. Recurrent major depressive disorder, in partial remission (HCC) Start back on prozac  - FLUoxetine  (PROZAC ) 20 MG capsule; Take 1 capsule (20 mg total) by mouth daily.  Dispense: 90 capsule; Refill: 3  8. GAD (generalized anxiety disorder) Stress management - LORazepam  (ATIVAN ) 0.5 MG tablet; Take 1 tablet (0.5 mg total) by mouth every 8 (eight) hours as  needed. for anxiety  Dispense: 90 tablet; Refill: 1  9. Iron deficiency anemia secondary to inadequate dietary iron intake Labs pending  10. History of infiltrating ductal carcinoma of breast  11. Tobacco abuse Smoking cessation  12. Obesity with body mass index of 30.0-39.9 Discussed diet and exercise for person with BMI >25 Will recheck weight in 3-6 months   13. Type 2 diabetes mellitus with hyperglycemia, without long-term current use of insulin  (HCC) Stop metformin  Added januvia 100mg  daily Keep diary of blood sugars - Bayer DCA Hb A1c Waived - Vitamin B12  Labs pending Health Maintenance reviewed Diet and exercise encouraged  Follow up plan: 3 months   [name], [name]

## 2024-08-24 LAB — LIPID PANEL
Chol/HDL Ratio: 11.7 ratio — ABNORMAL HIGH (ref 0.0–4.4)
Cholesterol, Total: 305 mg/dL — ABNORMAL HIGH (ref 100–199)
HDL: 26 mg/dL — ABNORMAL LOW (ref >39)
LDL Chol Calc (NIH): 183 mg/dL — ABNORMAL HIGH (ref 0–99)
Triglycerides: 468 mg/dL — ABNORMAL HIGH (ref 0–149)
VLDL Cholesterol Cal: 96 mg/dL — ABNORMAL HIGH (ref 5–40)

## 2024-08-24 LAB — CBC WITH DIFFERENTIAL/PLATELET
Basophils Absolute: 0.1 x10E3/uL (ref 0.0–0.2)
Basos: 1 %
EOS (ABSOLUTE): 0.2 x10E3/uL (ref 0.0–0.4)
Eos: 2 %
Hematocrit: 39.3 % (ref 34.0–46.6)
Hemoglobin: 13.1 g/dL (ref 11.1–15.9)
Immature Grans (Abs): 0.1 x10E3/uL (ref 0.0–0.1)
Immature Granulocytes: 1 %
Lymphocytes Absolute: 2.9 x10E3/uL (ref 0.7–3.1)
Lymphs: 30 %
MCH: 30.5 pg (ref 26.6–33.0)
MCHC: 33.3 g/dL (ref 31.5–35.7)
MCV: 91 fL (ref 79–97)
Monocytes Absolute: 0.7 x10E3/uL (ref 0.1–0.9)
Monocytes: 7 %
Neutrophils Absolute: 5.7 x10E3/uL (ref 1.4–7.0)
Neutrophils: 59 %
Platelets: 240 x10E3/uL (ref 150–450)
RBC: 4.3 x10E6/uL (ref 3.77–5.28)
RDW: 12.2 % (ref 11.7–15.4)
WBC: 9.5 x10E3/uL (ref 3.4–10.8)

## 2024-08-24 LAB — CMP14+EGFR
ALT: 71 IU/L — ABNORMAL HIGH (ref 0–32)
AST: 91 IU/L — ABNORMAL HIGH (ref 0–40)
Albumin: 4.3 g/dL (ref 3.8–4.9)
Alkaline Phosphatase: 93 IU/L (ref 49–135)
BUN/Creatinine Ratio: 7 — ABNORMAL LOW (ref 9–23)
BUN: 8 mg/dL (ref 6–24)
Bilirubin Total: 0.3 mg/dL (ref 0.0–1.2)
CO2: 25 mmol/L (ref 20–29)
Calcium: 9.4 mg/dL (ref 8.7–10.2)
Chloride: 96 mmol/L (ref 96–106)
Creatinine, Ser: 1.14 mg/dL — ABNORMAL HIGH (ref 0.57–1.00)
Globulin, Total: 2.8 g/dL (ref 1.5–4.5)
Glucose: 222 mg/dL — ABNORMAL HIGH (ref 70–99)
Potassium: 4.5 mmol/L (ref 3.5–5.2)
Sodium: 135 mmol/L (ref 134–144)
Total Protein: 7.1 g/dL (ref 6.0–8.5)
eGFR: 57 mL/min/1.73 — ABNORMAL LOW (ref >59)

## 2024-08-26 ENCOUNTER — Ambulatory Visit: Payer: Self-pay | Admitting: Nurse Practitioner

## 2024-08-28 LAB — SPECIMEN STATUS REPORT

## 2024-08-28 LAB — VITAMIN D 25 HYDROXY (VIT D DEFICIENCY, FRACTURES): Vit D, 25-Hydroxy: 27 ng/mL — AB (ref 30.0–100.0)

## 2024-08-28 LAB — MAGNESIUM: Magnesium: 1.4 mg/dL — AB (ref 1.6–2.3)

## 2024-09-06 ENCOUNTER — Other Ambulatory Visit (HOSPITAL_COMMUNITY): Payer: Self-pay

## 2024-09-06 ENCOUNTER — Encounter: Payer: Self-pay | Admitting: Nurse Practitioner

## 2024-09-06 ENCOUNTER — Telehealth: Payer: Self-pay | Admitting: Pharmacy Technician

## 2024-09-06 ENCOUNTER — Ambulatory Visit: Payer: Self-pay | Admitting: Nurse Practitioner

## 2024-09-06 ENCOUNTER — Ambulatory Visit: Payer: Self-pay | Admitting: *Deleted

## 2024-09-06 ENCOUNTER — Telehealth: Payer: Self-pay | Admitting: Nurse Practitioner

## 2024-09-06 ENCOUNTER — Ambulatory Visit: Admitting: Nurse Practitioner

## 2024-09-06 VITALS — BP 150/84 | HR 72 | Temp 97.1°F | Ht 68.0 in | Wt 200.0 lb

## 2024-09-06 DIAGNOSIS — Z794 Long term (current) use of insulin: Secondary | ICD-10-CM

## 2024-09-06 DIAGNOSIS — E1169 Type 2 diabetes mellitus with other specified complication: Secondary | ICD-10-CM | POA: Diagnosis not present

## 2024-09-06 DIAGNOSIS — J0100 Acute maxillary sinusitis, unspecified: Secondary | ICD-10-CM

## 2024-09-06 DIAGNOSIS — E785 Hyperlipidemia, unspecified: Secondary | ICD-10-CM

## 2024-09-06 LAB — BMP8+ANION GAP
Anion Gap: 14 mmol/L (ref 10.0–18.0)
BUN/Creatinine Ratio: 9 (ref 9–23)
BUN: 11 mg/dL (ref 6–24)
CO2: 24 mmol/L (ref 20–29)
Calcium: 9.7 mg/dL (ref 8.7–10.2)
Chloride: 96 mmol/L (ref 96–106)
Creatinine, Ser: 1.18 mg/dL — ABNORMAL HIGH (ref 0.57–1.00)
Glucose: 373 mg/dL — ABNORMAL HIGH (ref 70–99)
Potassium: 4.9 mmol/L (ref 3.5–5.2)
Sodium: 134 mmol/L (ref 134–144)
eGFR: 55 mL/min/1.73 — ABNORMAL LOW (ref >59)

## 2024-09-06 LAB — GLUCOSE HEMOCUE WAIVED: Glu Hemocue Waived: 384 mg/dL — ABNORMAL HIGH (ref 70–99)

## 2024-09-06 MED ORDER — DEXCOM G7 SENSOR MISC: 5 refills | Status: AC

## 2024-09-06 MED ORDER — ROSUVASTATIN CALCIUM 10 MG PO TABS: 10.0000 mg | ORAL_TABLET | Freq: Every day | ORAL | 3 refills | Status: DC

## 2024-09-06 MED ORDER — AZITHROMYCIN 250 MG PO TABS: ORAL_TABLET | ORAL | 0 refills | Status: DC

## 2024-09-06 MED ORDER — LANTUS SOLOSTAR 100 UNIT/ML ~~LOC~~ SOPN: 20.0000 [IU] | PEN_INJECTOR | Freq: Every day | SUBCUTANEOUS | 99 refills | Status: DC

## 2024-09-06 MED ORDER — DEXCOM G7 SENSOR MISC: 1.0000 [IU] | 5 refills | Status: DC

## 2024-09-06 NOTE — Addendum Note (Signed)
 Addended by: [name] on: 09/06/2024 10:42 AM   Modules accepted: Orders

## 2024-09-06 NOTE — Patient Instructions (Signed)

## 2024-09-06 NOTE — Telephone Encounter (Addendum)
 FYI Only or Action Required?: Action required by provider: medication refill request. Lantus  need pre-authorization before pt can pick it up today. Pharmacy is [facility] in [place], [place]  Patient was last seen in primary care on 09/06/2024 by [name], [name].  Called Nurse Triage reporting Sinusitis (Elevated glucose, sinus symptoms) and Medication Problem.  Symptoms began Symptoms unchanged from triage earlier today.  Interventions attempted: Other: See separate triage note.  Symptoms are: unchanged.  Triage Disposition: Call PCP Now, See HCP Within 4 Hours (Or PCP Triage)  Patient/caregiver understands and will follow disposition?: Yes  CRM # [id number] Owner: None Status: Resolved Open  Priority: High Created on: 09/06/2024 11:08 AM By: Bluford Roselie POUR   Red Word that prompted transfer to Nurse Triage: Patient called to say she went to pharmacy to pick up her insulin , and wasn't able to get it, requires pre authorization.  But patient mentioned her blood sugar is over 400 at this time.   Reason for Disposition  [1] Prescription not at pharmacy AND [2] was prescribed by doctor (or NP/PA) recently  (Exception: Triager has access to EMR and prescription is recorded there. Go to Home Care and confirm for pharmacy.)  Answer Assessment - Initial Assessment Questions Pt triaged today for sinus issues and blood sugars in to 300-400's today. Scheduled for office visit and saw PCP. Pt declined ED at that time, plan for pt to refill insulin  at pharmacy and take today. Pt went to pick up rx but unable to as it requires prior auth. No changes in symptoms (Nausea, mental fogginess, increased thirst and increased urination). Called [facility] and spoke with Montie to notify med need pre-auth. Sending HP message over.  1. NAME of MEDICINE: What medicine(s) are you calling about?     Lantus  20 units  2. QUESTION: What is your question? (e.g., double dose of medicine, side effect)      Sent today to pharmacy after acute visit with provider for high blood sugar in the 300-400's and sinus issues. Pt went to pick up med from pharmacy but was unable to obtain it as it requires pre-authorization. Pharmacy is [facility] in [place], [place].  3. PRESCRIBER: Who prescribed the medicine? Reason: if prescribed by specialist, call should be referred to that group.     [name], [name]  4. SYMPTOMS: Do you have any symptoms? If Yes, ask: What symptoms are you having?  How bad are the symptoms (e.g., mild, moderate, severe)     Nausea, foggy, increased thirst and increased urination. Symptoms have not changed since seeing provider today in office for current symptoms.  Protocols used: Medication Question Call-A-AH

## 2024-09-06 NOTE — Progress Notes (Signed)
 Subjective:    Patient ID: Brenda Cruz, female    DOB: [birth year], 58 y.o.   MRN: [id number]   Chief Complaint: Sinus Problem and Hyperglycemia   Sinus Problem Associated symptoms include congestion and sinus pressure. Pertinent negatives include no chills.  Hyperglycemia Associated symptoms include congestion. Pertinent negatives include no chills or fever.   Patient come sin today with 2 complaints: - sinus issues- Patient got sick about 8 days ago. Cough and congestion with facial pressure. Has gotten worse the last several days. No fever. Has tried claritin, decongestant, nasal spray- no relief. -hyperglycemia- patient has been wearing her husband dexcom and blood sugar just says high. Finger sticks have been over 300.  Lab Results  Component Value Date   HGBA1C 9.2 (H) 08/23/2024    Patient Active Problem List   Diagnosis Date Noted   Dermatitis 08/14/2023   History of lumbar fusion 08/11/2023   Hair loss 08/03/2023   Obstructive sleep apnea 06/21/2023   Ventral hernia without obstruction or gangrene 01/11/2023   Inflammatory disorder of digestive system 01/17/2022   Screen for colon cancer 11/24/2020   History of cancer of left breast 11/24/2020   History of external beam radiation therapy 11/24/2020   History of lumpectomy of left breast 11/24/2020   Tobacco abuse 07/02/2019   Cancer related pain 03/01/2018   Skin yeast infection 02/07/2018   Hypomagnesemia 01/29/2018   Hypokalemia 01/23/2018   Candida UTI 01/05/2018   Anxiety in cancer patient 12/27/2017   History of infiltrating ductal carcinoma of breast 12/15/2017   Type 2 diabetes mellitus (HCC) 12/15/2017   Family history of breast cancer in first degree relative 12/15/2017   Obesity with body mass index of 30.0-39.9 12/04/2017   Hyperlipidemia associated with type 2 diabetes mellitus (HCC) 03/15/2017   Stress incontinence of urine 12/07/2016   COPD (chronic obstructive pulmonary disease) (HCC)  12/07/2016   Hx of pulmonary embolus 12/07/2015   Iron deficiency anemia 11/23/2015   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  HENT:  Positive for congestion and sinus pressure. Negative for trouble swallowing.        Objective:   Physical Exam Constitutional:      Appearance: Normal appearance. She is obese.  Cardiovascular:     Rate and Rhythm: Normal rate and regular rhythm.     Heart sounds: Normal heart sounds.  Pulmonary:     Effort: Pulmonary effort is normal.     Breath sounds: Normal breath sounds.  Skin:    General: Skin is warm.  Neurological:     General: No focal deficit present.     Mental Status: She is alert and oriented to person, place, and time.  Psychiatric:        Mood and Affect: Mood normal.        Behavior: Behavior normal.    BP (!) 150/84   Pulse 72   Temp (!) 97.1 F (36.2 C) (Temporal)   Ht 5' 8 (1.727 m)   Wt 200 lb (90.7 kg)   LMP 06/03/2017 (Exact Date)   SpO2 98%   BMI 30.41 kg/m         Assessment & Plan:  [name] in today with chief complaint of Sinus Problem and Hyperglycemia   1. Type 2 diabetes mellitus with other specified complication, with long-term current use of insulin  (HCC) (Primary) Added lantus  Ordered dexcom Probably is in ketoacidosis but patient refuses to go to the hospital - Glucose Hemocue Waived - BMP8+Anion Gap - insulin  glargine (LANTUS  SOLOSTAR) 100 UNIT/ML Solostar Pen; Inject 20 Units into the skin daily.  Dispense: 15 mL; Refill: PRN - Continuous Glucose Sensor (DEXCOM G7 SENSOR) MISC; 1 Units by Does not apply route every 14 (fourteen) days.  Dispense: 3 each; Refill:  5  2. Acute non-recurrent maxillary sinusitis 1. Take meds as prescribed 2. Use a cool mist humidifier especially during the winter months and when heat has been humid. 3. Use saline nose sprays frequently 4. Saline irrigations of the nose can be very helpful if done frequently.  * 4X daily for 1 week*  * Use of a nettie pot can be helpful with this. Follow directions with this* 5. Drink plenty of fluids 6. Keep thermostat turn down low 7.For any cough or congestion- tessalon  perles 8. For fever or aces or pains- take tylenol  or ibuprofen  appropriate for age and weight.  * for fevers greater than 101 orally you may alternate ibuprofen  and tylenol  every  3 hours.    - azithromycin  (ZITHROMAX  Z-PAK) 250 MG tablet; As directed  Dispense: 6 tablet; Refill: 0  3. Hyperlipidemia associated with type 2 diabetes mellitus (HCC) Start on crestor  daily - rosuvastatin  (CRESTOR ) 10 MG tablet; Take 1 tablet (10 mg total) by mouth daily.  Dispense: 90 tablet; Refill: 3    The above assessment and management plan was discussed with the patient. The patient verbalized understanding of and has agreed to the management plan. Patient is aware to call the clinic if symptoms persist or worsen. Patient is aware when to return to the clinic for a follow-up visit. Patient educated on when it is appropriate to go to the emergency department.   [name], [name]

## 2024-09-06 NOTE — Telephone Encounter (Signed)
 Copied from CRM [id number]. Topic: Clinical - Prescription Issue >> Sep 06, 2024 10:37 AM Zebedee SAUNDERS wrote: Reason for CRM: Received call from [facility], [place] [address] [place] [address] Phone: [phone] Fax: [phone] per Dorise stated instructions were incorrect for Continuous Glucose Sensor (DEXCOM G7 SENSOR) MISC and to resend correct instructions and hung up.

## 2024-09-06 NOTE — Telephone Encounter (Signed)
 FYI Only or Action Required?: FYI only for provider: appointment scheduled on 11/21.  Patient was last seen in primary care on 08/23/2024 by [name], [name].  Called Nurse Triage reporting Sinusitis (Elevated glucose, sinus symptoms).  Symptoms began several weeks ago.  Interventions attempted: Rest, hydration, or home remedies.  Symptoms are: gradually worsening.  Triage Disposition: See HCP Within 4 Hours (Or PCP Triage)  Patient/caregiver understands and will follow disposition?: Yes  Copied from CRM #[id number]. Topic: Clinical - Red Word Triage >> Sep 06, 2024  8:19 AM Miquel SAILOR wrote: Red Word that prompted transfer to Nurse Triage: Sinus infections/jaw hurts/ears pain/Headache/since the 11/08 getting worse/blood sugar 383 and 483 as of 11/20 >> Sep 06, 2024  8:20 AM Miquel SAILOR wrote: Sinus infections/jaw hurts/ears pain/Headache/since the 11/08 getting worse/blood sugar 383 and 483 as of 11/20 foggy head due to sugar high Reason for Disposition  [1] Redness or swelling on the cheek, forehead or around the eye AND [2] no fever  Answer Assessment - Initial Assessment Questions 1. LOCATION: Where does it hurt?      Head ache, cheeks, eyes, brow 2. ONSET: When did the sinus pain start?  (e.g., hours, days)      08/24/24 3. SEVERITY: How bad is the pain?   (Scale 0-10; or none, mild, moderate or severe)     Nighttime 7/10 4. RECURRENT SYMPTOM: Have you ever had sinus problems before? If Yes, ask: When was the last time? and What happened that time?      Yes- 3 months 5. NASAL CONGESTION: Is the nose blocked? If Yes, ask: Can you open it or must you breathe through your mouth?     Open nasal passages 6. NASAL DISCHARGE: Do you have discharge from your nose? If so ask, What color?     Yellow, thick 7. FEVER: Do you have a fever? If Yes, ask: What is it, how was it measured, and when did it start?      Last night- sweats, 99.9 8. OTHER SYMPTOMS: Do you  have any other symptoms? (e.g., sore throat, cough, earache, difficulty breathing)     Sore throat, ear pressure  Patient has changed from 2/day Glucophage - Januvia  patient has been  feeling foggy, nausea, loopy, unable to concentrate- 383 and above  Protocols used: Sinus Pain or Congestion-A-AH

## 2024-09-06 NOTE — Telephone Encounter (Signed)
 Copied from CRM [id number]. Topic: Clinical - Red Word Triage >> Sep 06, 2024 11:08 AM Roselie BROCKS wrote: [facility] that prompted transfer to Nurse Triage: Patient called to say she went to pharmacy to pick up her insulin , and wasn't able to get it, requires pre authorization.  But patient mentioned her blood sugar is over 400 at this time.

## 2024-09-06 NOTE — Telephone Encounter (Signed)
 This was already taken care of patient walked back to mmm to discuss.

## 2024-09-06 NOTE — Telephone Encounter (Signed)
 Pharmacy Patient Advocate Encounter   Received notification from Onbase that prior authorization for Lantus  SoloStar 100UNIT/ML pen-injectors is required/requested.   Insurance verification completed.   The patient is insured through [employer] [place] .   Per test claim: The current 75 day co-pay is, $105.00.  No PA needed at this time. This test claim was processed through [facility]- copay amounts may vary at other pharmacies due to pharmacy/plan contracts, or as the patient moves through the different stages of their insurance plan.

## 2024-09-09 ENCOUNTER — Telehealth: Payer: Self-pay

## 2024-09-09 ENCOUNTER — Ambulatory Visit: Payer: Self-pay

## 2024-09-09 ENCOUNTER — Other Ambulatory Visit: Payer: Self-pay | Admitting: Nurse Practitioner

## 2024-09-09 ENCOUNTER — Other Ambulatory Visit: Payer: Self-pay

## 2024-09-09 MED ORDER — INSULIN GLARGINE (2 UNIT DIAL) 300 UNIT/ML ~~LOC~~ SOPN: 20.0000 [IU] | PEN_INJECTOR | Freq: Every day | SUBCUTANEOUS | 2 refills | Status: DC

## 2024-09-09 MED ORDER — FLUCONAZOLE 150 MG PO TABS: 150.0000 mg | ORAL_TABLET | Freq: Once | ORAL | 0 refills | Status: AC

## 2024-09-09 NOTE — Telephone Encounter (Signed)
 FYI Only or Action Required?: Action required by provider: clinical question for provider, update on patient condition, and needs medication for blood sugar control.  Patient was last seen in primary care on 09/06/2024 by [name], [name].  Called Nurse Triage reporting Hyperglycemia.  Symptoms began several months ago.  Interventions attempted: Prescription medications: Husband's insulin .  Symptoms are: gradually worsening.  Triage Disposition: Call PCP Now  Patient/caregiver understands and will follow disposition?:   Copied from CRM #[id number]. Topic: Clinical - Red Word Triage >> Sep 09, 2024 10:42 AM Avram MATSU wrote: Red Word that prompted transfer to Nurse Triage: blood sugar over the weekend was 400 and yeats infection Reason for Disposition  [1] Blood glucose > 300 mg/dL (83.2 mmol/L) AND [7] two or more times in a row  Answer Assessment - Initial Assessment Questions Patient states blood sugars in 400's for a couple months. Was seen by Brenda [name] 11/21 and was prescribed insulin  but needed approval for insurance so couldn't get it. Patient states she took husbands insulin  Basaglar  long acting 15 units on Friday because she couldn't get her insulin  because it needed preauth. Started antibiotics for sinus infection. Saturday started having a yeast infection. Contacted CAL. Spoke to [place] at [employer] office about insulin  and yeast infection. He said he would inform Ronal Quant.  1. BLOOD GLUCOSE: What is your blood glucose level?      319 now, was reading too high over the weekend to read.  2. ONSET: When did you check the blood glucose?     10:50 3. USUAL RANGE: What is your glucose level usually? (e.g., usual fasting morning value, usual evening value)     Has been running in the 400's for 2 months.  4. KETONES: Do you check for ketones (urine or blood test strips)? If Yes, ask: What does the test show now?      Denies. [place] were not  tested at office per patient. 5. TYPE 1 or 2:  Do you know what type of diabetes you have?  (e.g., Type 1, Type 2, Gestational; doesn't know)      Type 2  6. INSULIN : Do you take insulin ? What type of insulin (s) do you use? What is the mode of delivery? (syringe, pen; injection or pump)?      Previously taken Glucophage  got taken off 1 week ago. And put on Januvia  for 1 week ago then taken off that and has not able to get her insulin  and took her husbands insulin  Basaglar  15 units Friday since she didn't have anything and didn't want to go the hospital.   7. DIABETES PILLS: Do you take any pills for your diabetes? If Yes, ask: Have you missed taking any pills recently?     denies 8. OTHER SYMPTOMS: Do you have any symptoms? (e.g., fever, frequent urination, difficulty breathing, dizziness, weakness, vomiting)     Frequent urination, difficulty sleeping, fatigue. Started getting a yeast infection Saturday. Complains of itching, painful to wipe  Protocols used: Diabetes - High Blood Sugar-A-AH

## 2024-09-09 NOTE — Progress Notes (Signed)
 Patient notified earlier by triage nurse

## 2024-09-09 NOTE — Telephone Encounter (Signed)
 Copied from CRM [id number]. Topic: Clinical - Prescription Issue >> Sep 09, 2024 12:03 PM Montie POUR wrote: Reason for CRM:  [facility] if [facility] will write a new order for insulin  glargine, 2 Unit Dial , (TOUJEO  MAX) 300 UNIT/ML Solostar Pen. It only comes in a 3 ML. Please call Redell at [phone] with any questions. Thanks

## 2024-09-09 NOTE — Telephone Encounter (Signed)
 Please advise

## 2024-09-09 NOTE — Addendum Note (Signed)
 Addended by: [name] on: 09/09/2024 04:18 PM   Modules accepted: Orders

## 2024-09-09 NOTE — Telephone Encounter (Signed)
 Spoke to patient . Her co-pay is $105.00. She states that is too much. She would like to be switched to the same insulin  that her husband Brenda Cruz takes. They both have the same insurance and she states Basaglar  is covered with a lower co-pay.   Patient also states she has developed a yeast infection from prescribed antibiotic treatment. She would like medications sent to her pharmacy to treat yeast infection

## 2024-09-09 NOTE — Telephone Encounter (Signed)
 Copied from CRM #[id number]. Topic: Clinical - Medication Question >> Sep 09, 2024  1:31 PM Harlene ORN wrote: Reason for CRM: Patient has been waitng since 09/06/2024 for her insulin . Needs her 6 ML insulin , but was called in 3 ML instead. Was forced to split insulin  with her husband. Please speak to the patient about this ASAP.

## 2024-09-09 NOTE — Telephone Encounter (Signed)
 Taken care of by nurse. Had to change quantity to 6ml

## 2024-09-09 NOTE — Telephone Encounter (Signed)
 Called and spoke with Redell at [facility]. Rx has been corrected and patient is able to get insulin .

## 2024-09-09 NOTE — Progress Notes (Signed)
 Changed lantus  to toujeo - start with 20 u daily  Meds ordered this encounter  Medications   insulin  glargine, 2 Unit Dial , (TOUJEO  MAX) 300 UNIT/ML Solostar Pen    Sig: Inject 20 Units into the skin daily.    Dispense:  3 mL    Refill:  2    Supervising Provider:   MARYANNE CHEW A [[id number]]   [name], [name]

## 2024-09-10 ENCOUNTER — Other Ambulatory Visit: Payer: Self-pay

## 2024-09-10 MED ORDER — PEN NEEDLES 32G X 4 MM MISC: 1 refills | Status: DC

## 2024-09-16 ENCOUNTER — Other Ambulatory Visit (HOSPITAL_COMMUNITY): Payer: Self-pay

## 2024-09-16 ENCOUNTER — Telehealth: Payer: Self-pay | Admitting: Pharmacist

## 2024-09-16 MED ORDER — PEN NEEDLES 32G X 4 MM MISC: 1 refills | Status: AC

## 2024-09-16 NOTE — Telephone Encounter (Signed)
   Patient newly started on insulin  (able to get Toujeo ).  Lantus  required a PA.  Patient injects 20 units daily (in the AM). BG now is 220-300.  Was reading in the 400-600 range.  She is wearing Dexcom G7 (using phone).  For 7 days: GMI 10.8%, BG 88% very high, BG 11% high, BG 1% in range. Increase Toujeo  to 22 units.  F/u pharmD appt tomorrow @ 3PM Plan to start Mounjaro   Chukwudi Ewen Dattero Yuriel Lopezmartinez, PharmD, BCACP, CPP Clinical Pharmacist, [facility]

## 2024-09-17 ENCOUNTER — Other Ambulatory Visit

## 2024-09-17 ENCOUNTER — Encounter: Payer: Self-pay | Admitting: Pharmacist

## 2024-09-17 VITALS — BP 132/75

## 2024-09-17 DIAGNOSIS — E1165 Type 2 diabetes mellitus with hyperglycemia: Secondary | ICD-10-CM

## 2024-09-17 DIAGNOSIS — E785 Hyperlipidemia, unspecified: Secondary | ICD-10-CM

## 2024-09-17 DIAGNOSIS — G720 Drug-induced myopathy: Secondary | ICD-10-CM

## 2024-09-17 MED ORDER — TIRZEPATIDE 2.5 MG/0.5ML ~~LOC~~ SOAJ: 2.5000 mg | SUBCUTANEOUS | Status: DC

## 2024-09-17 NOTE — Patient Instructions (Signed)
 STOP Januvia   Continue Toujeo  insulin  22 units every morning  START Mounjaro 2.5mg  weekly 09/18/24  Consider Repatha  in January

## 2024-09-17 NOTE — Progress Notes (Signed)
 09/17/2024 Name: Brenda Cruz MRN: [id number] DOB: 1966-07-08  Chief Complaint  Patient presents with   Diabetes    Brenda Cruz is a 58 y.o. year old female who was referred for medication management by their primary care provider, Brenda Mustard, [name]. They presented for a face to face visit today.   They were referred to the pharmacist by safety zone portal for assistance in managing diabetes and complex medication management    Subjective:  Care Team: Primary Care Provider: [name], [name]   Medication Access/Adherence  Current Pharmacy:  [facility] [place] [place] - [address] [place] [address] Phone: [phone] Fax: [phone]   Patient reports affordability concerns with their medications: No  Patient reports access/transportation concerns to their pharmacy: No  Patient reports adherence concerns with their medications:  No     Diabetes:  Current medications: Toujeo  new start insulin , Januvia  Medications tried in the past: metformin , Ozempic /rybelsus  (both caused GI upset, not tolerated)  Current glucose readings: variable, up to 400s  Observed patterns:   Patient denies hypoglycemic s/sx including dizziness, shakiness, sweating. Patient reports hyperglycemic symptoms including polyuria, polydipsia, polyphagia, nocturia, neuropathy, blurred vision.  Current meal patterns:  The patient is asked to make an attempt to improve diet and exercise patterns to aid in medical management of this problem.  Discussed meal planning options and Plate method for healthy eating Avoid sugary drinks and desserts Incorporate balanced protein, non starchy veggies, 1 serving of carbohydrate with each meal Increase water  intake Increase physical activity as able   Current physical activity: encouraged as able  Current medication access support: n/a  Macrovascular and Microvascular Risk Reduction:  Statin? yes  (rosuvasatin, but myopathies); ACEi/ARB? Allergy to ACEI Last urinary albumin /creatinine ratio:  Lab Results  Component Value Date   MICRALBCREAT 73 (H) 12/28/2023   MICRALBCREAT 39 (H) 08/29/2022   MICRALBCREAT 18 01/12/2021   Last eye exam:  Lab Results  Component Value Date   HMDIABEYEEXA No Retinopathy 09/01/2021   Last foot exam: 04/12/2024 Tobacco Use:  Tobacco Use: High Risk (09/23/2024)   Patient History    Smoking Tobacco Use: Every Day    Smokeless Tobacco Use: Never    Passive Exposure: Not on file     Objective:  Lab Results  Component Value Date   HGBA1C 9.2 (H) 08/23/2024    Lab Results  Component Value Date   CREATININE 1.18 (H) 09/06/2024   BUN 11 09/06/2024   NA 134 09/06/2024   K 4.9 09/06/2024   CL 96 09/06/2024   CO2 24 09/06/2024    Lab Results  Component Value Date   CHOL 305 (H) 08/23/2024   HDL 26 (L) 08/23/2024   LDLCALC 183 (H) 08/23/2024   TRIG 468 (H) 08/23/2024   CHOLHDL 11.7 (H) 08/23/2024    Medications Reviewed Today     Reviewed by Billee Mliss BIRCH, RPH-CPP (Pharmacist) on 09/23/24 at 1941  Med List Status: <None>   Medication Order Taking? Sig Documenting Provider Last Dose Status Informant  albuterol  (VENTOLIN  HFA) 108 (90 Base) MCG/ACT inhaler [id number]  Inhale 2 puffs into the lungs every 4 (four) hours as needed for wheezing or shortness of breath. Joshua Clayborne RAMAN, PA-C  Active Self, Pharmacy Records  amLODipine  (NORVASC ) 10 MG tablet [id number]  Take 1 tablet (10 mg total) by mouth daily. Brenda, [name], [name]  Active   azithromycin  (ZITHROMAX  Z-PAK) 250 MG tablet [id number]  As directed Brenda Mustard, [name]  Active   blood glucose meter kit and supplies [id number]  Dispense based on patient and insurance preference. Use up to four times daily as directed. (FOR ICD-10 E10.9, E11.9). Brenda Mustard, [name]  Active Self, Pharmacy Records  Budeson-Glycopyrrol-Formoterol  [facility] AEROSPHERE) 160-9-4.8 MCG/ACT TERESE  [id number]  Inhale into the lungs. [provider]  Active   Calcium  Carb-Cholecalciferol (CALCIUM  600 + D PO) [id number]  Take 1 tablet by mouth daily. [provider]  Active Self, Pharmacy Records  cetirizine  (ZYRTEC ) 10 MG tablet [id number]  TAKE ONE (1) TABLET BY MOUTH EVERY DAY Brenda, [name], [name]  Active   clobetasol  cream (TEMOVATE ) 0.05 % [id number]  Apply 1 Application topically 2 (two) times daily. [place] Thompson, Nena, NP  Active   Continuous Glucose Sensor (DEXCOM G7 SENSOR) [place] [id number]  Change every 10 days Brenda Mustard, [name]  Active   cyclobenzaprine  (FLEXERIL ) 10 MG tablet [id number]  TAKE ONE TABLET BY MOUTH THREE TIMES DAILY AS NEEDED FOR MUSCLE SPASM Brenda [name], [name]  Active Self, Pharmacy Records  diphenhydrAMINE  (BENADRYL ) 50 MG tablet [id number]  Take 1 tablet (50 mg total) by mouth once for 1 dose. Take on date/time instructed by prescribing provider.  Take within 1 hour of contrast injection. Call [phone] for questions. Pappayliou, Dorothyann LABOR, DO  Active   esomeprazole  (NEXIUM ) 40 MG capsule [id number]  Take 1 capsule (40 mg total) by mouth daily. Brenda, [name], [name]  Active   FLUoxetine  (PROZAC ) 20 MG capsule [id number]  Take 1 capsule (20 mg total) by mouth daily. Brenda, [name], [name]  Active   fluticasone  (FLONASE ) 50 MCG/ACT nasal spray [id number]  Place 2 sprays into both nostrils daily. Cherylene Homer HERO, NP  Active Self, Pharmacy Records  ibuprofen  (ADVIL ) 200 MG tablet [id number]  Take 400 mg by mouth every 6 (six) hours as needed for moderate pain. [provider]  Active Self, Pharmacy Records  insulin  glargine, 2 Unit Dial , (TOUJEO  MAX) 300 UNIT/ML Solostar Pen [id number]  Inject 20 Units into the skin daily. Brenda, [name], [name]  Active   Insulin  Pen Needle (PEN NEEDLES) 32G X 4 MM MISC [id number]  Use to administer insulin  daily. DX: E11.65 Brenda, [name], [name]  Active    ipratropium-albuterol  (DUONEB) 0.5-2.5 (3) MG/3ML SOLN [id number]  Take 3 mLs by nebulization every 6 (six) hours as needed (Foir shortness of breath). Brenda Mustard, [name]  Active Self, Pharmacy Records  LORazepam  (ATIVAN ) 0.5 MG tablet [id number]  Take 1 tablet (0.5 mg total) by mouth at bedtime. Brenda, [name], [name]  Active   magnesium  oxide (MAG-OX) 400 MG tablet [id number]  Take 2 tablets (800 mg total) by mouth 2 (two) times daily. Brenda, [name], [name]  Active   metoprolol  tartrate (LOPRESSOR ) 50 MG tablet [id number]  Take 1 tablet (50 mg total) by mouth 2 (two) times daily. Brenda, [name], [name]  Active   nitroGLYCERIN  (NITROSTAT ) 0.4 MG SL tablet [id number]  Place 1 tablet (0.4 mg total) under the tongue every 5 (five) minutes as needed for chest pain. Daija, [place], PA  Expired 09/06/24 2359 Self  ondansetron  (ZOFRAN -ODT) 8 MG disintegrating tablet [id number]  TAKE 1 TABLET BY MOUTH EVERY 6 HOURS AS NEEDED FOR NAUSEA & VOMITING Brenda, [name], [name]  Active   rosuvastatin  (CRESTOR ) 10 MG tablet [id number]  Take 1 tablet (10 mg total) by mouth daily. Brenda Mustard, [name]  Active     Discontinued 09/17/24 1509 (Change in therapy)   tirzepatide  (MOUNJARO ) 2.5 MG/0.5ML Pen [id number] Yes Inject 2.5 mg into the skin  once a week. Brenda, [name], [name]  Active   Vitamin D , Ergocalciferol , (DRISDOL ) 1.25 MG (50000 UNIT) CAPS capsule [id number]  Take 1 capsule (50,000 Units total) by mouth every 7 (seven) days. Zollie Lowers, MD  Active             Assessment/Plan:   Diabetes: - Currently uncontrolled; goal A1c <7%. Cardiorenal risk reduction is opportunities for improvement.. Blood pressure is at goal <130/80. LDL is not at goal.  - Reviewed long term cardiovascular and renal outcomes of uncontrolled blood sugar. and Reviewed goal A1c, goal fasting, and goal 2 hour post prandial glucose. Recommended to check glucose using Dexcom G7--samples given -   Patient denies personal or family history of multiple endocrine neoplasia type 2, medullary thyroid  cancer; personal history of pancreatitis or gallbladder disease., Discussed side effects of gastrointestinal upset/nausea; eating smaller meals, avoiding high-fat foods, and remaining upright after eating may reduce nausea. Discussed that overeating is a major trigger of nausea with this class of medications, as often times patients will start to feel full sooner and may need to decrease portion sizes from what they were previously accustomed to.  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

## 2024-09-26 ENCOUNTER — Ambulatory Visit: Admitting: Nurse Practitioner

## 2024-10-15 ENCOUNTER — Other Ambulatory Visit

## 2024-10-15 DIAGNOSIS — E1165 Type 2 diabetes mellitus with hyperglycemia: Secondary | ICD-10-CM

## 2024-10-15 DIAGNOSIS — K219 Gastro-esophageal reflux disease without esophagitis: Secondary | ICD-10-CM

## 2024-10-15 MED ORDER — TIRZEPATIDE 2.5 MG/0.5ML ~~LOC~~ SOAJ: 2.5000 mg | SUBCUTANEOUS | 0 refills | Status: DC

## 2024-10-15 MED ORDER — FAMOTIDINE 40 MG PO TABS: 40.0000 mg | ORAL_TABLET | Freq: Every day | ORAL | 0 refills | Status: AC

## 2024-10-15 NOTE — Progress Notes (Signed)
 "  09/17/2024 Name: Brenda Cruz MRN: [id number] DOB: Nov 16, 1965  No chief complaint on file.   Brenda Cruz is a 58 y.o. year old female who was referred for medication management by their primary care provider, [name], [name]. They presented for a face to face visit today.   They were referred to the pharmacist by safety zone portal for assistance in managing diabetes and complex medication management    Subjective: New start Mounjaro  with drastic improvements in glycemic control.  Patient with constipation and GERD.  We will discuss management techniques.   Care Team: Primary Care Provider: [name], [name]   Medication Access/Adherence  Current Pharmacy:  [facility] - [place] [place] - [address] [place] [address] Phone: [phone] Fax: [phone]   Patient reports affordability concerns with their medications: No  Patient reports access/transportation concerns to their pharmacy: No  Patient reports adherence concerns with their medications:  No     Diabetes:  Current medications: Toujeo  22 units, mounjaro  2.5mg  Medications tried in the past: metformin , Ozempic /rybelsus  (both caused GI upset, not tolerated), januvia   Dexcom G7 readings as below; much improved  Observed patterns:    Patient denies hypoglycemic s/sx including dizziness, shakiness, sweating. Patient reports hyperglycemic symptoms including polyuria, polydipsia, polyphagia, nocturia, neuropathy, blurred vision.  Current meal patterns:  The patient is asked to make an attempt to improve diet and exercise patterns to aid in medical management of this problem.  Discussed meal planning options and Plate method for healthy eating Avoid sugary drinks and desserts Incorporate balanced protein, non starchy veggies, 1 serving of carbohydrate with each meal Increase water  intake Increase physical activity as able   Current physical activity:  encouraged as able  Current medication access support: n/a  Macrovascular and Microvascular Risk Reduction:  Statin? yes (rosuvasatin, but myopathies); ACEi/ARB? Allergy to ACEI Last urinary albumin /creatinine ratio:  Lab Results  Component Value Date   MICRALBCREAT 73 (H) 12/28/2023   MICRALBCREAT 39 (H) 08/29/2022   MICRALBCREAT 18 01/12/2021   Last eye exam:  Lab Results  Component Value Date   HMDIABEYEEXA No Retinopathy 09/01/2021   Last foot exam: 04/12/2024 Tobacco Use:  Tobacco Use: High Risk (09/23/2024)   Patient History    Smoking Tobacco Use: Every Day    Smokeless Tobacco Use: Never    Passive Exposure: Not on file     Objective:  Lab Results  Component Value Date   HGBA1C 9.2 (H) 08/23/2024    Lab Results  Component Value Date   CREATININE 1.18 (H) 09/06/2024   BUN 11 09/06/2024   NA 134 09/06/2024   K 4.9 09/06/2024   CL 96 09/06/2024   CO2 24 09/06/2024    Lab Results  Component Value Date   CHOL 305 (H) 08/23/2024   HDL 26 (L) 08/23/2024   LDLCALC 183 (H) 08/23/2024   TRIG 468 (H) 08/23/2024   CHOLHDL 11.7 (H) 08/23/2024    Medications Reviewed Today     Reviewed by Billee Mliss BIRCH, RPH-CPP (Pharmacist) on 10/15/24 at 1529  Med List Status: <None>   Medication Order Taking? Sig Documenting Provider Last Dose Status Informant  albuterol  (VENTOLIN  HFA) 108 (90 Base) MCG/ACT inhaler [id number]  Inhale 2 puffs into the lungs every 4 (four) hours as needed for wheezing or shortness of breath. Joshua Clayborne RAMAN, PA-C  Active Self, Pharmacy Records  amLODipine  (NORVASC ) 10 MG tablet [id number]  Take 1 tablet (10 mg total) by mouth daily.  [name], [name]  Active   azithromycin  (ZITHROMAX  Z-PAK) 250 MG tablet [id number]  As directed [name], [name]  Active   blood glucose meter kit and supplies [id number]  Dispense based on patient and insurance preference. Use up to four times daily as directed. (FOR ICD-10 E10.9, E11.9). [name], [name]  Active Self, Pharmacy Records  Budeson-Glycopyrrol-Formoterol  [facility] AEROSPHERE) 160-9-4.8 MCG/ACT TERESE [id number]  Inhale into the lungs. [provider]  Active   Calcium  Carb-Cholecalciferol (CALCIUM  600 + D PO) [id number]  Take 1 tablet by mouth daily. [provider]  Active Self, Pharmacy Records  cetirizine  (ZYRTEC ) 10 MG tablet [id number]  TAKE ONE (1) TABLET BY MOUTH EVERY DAY [name], [name], [name]  Active   clobetasol  cream (TEMOVATE ) 0.05 % [id number]  Apply 1 Application topically 2 (two) times daily. [place] Thompson, Nena, NP  Active   Continuous Glucose Sensor (DEXCOM G7 SENSOR) [place] [id number]  Change every 10 days [name], [name]  Active   cyclobenzaprine  (FLEXERIL ) 10 MG tablet [id number]  TAKE ONE TABLET BY MOUTH THREE TIMES DAILY AS NEEDED FOR MUSCLE SPASM [name] [name], [name]  Active Self, Pharmacy Records  diphenhydrAMINE  (BENADRYL ) 50 MG tablet [id number]  Take 1 tablet (50 mg total) by mouth once for 1 dose. Take on date/time instructed by prescribing provider.  Take within 1 hour of contrast injection. Call [phone] for questions. Pappayliou, Dorothyann LABOR, DO  Active   esomeprazole  (NEXIUM ) 40 MG capsule [id number]  Take 1 capsule (40 mg total) by mouth daily. [name], [name], [name]  Active   famotidine  (PEPCID ) 40 MG tablet [id number] Yes Take 1 tablet (40 mg total) by mouth daily. [name], [name], [name]  Active   FLUoxetine  (PROZAC ) 20 MG capsule [id number]  Take 1 capsule (20 mg total) by mouth daily. [name], [name], [name]  Active   fluticasone  (FLONASE ) 50 MCG/ACT nasal spray [id number]  Place 2 sprays into both nostrils daily. Cherylene Homer HERO, NP  Active Self, Pharmacy Records  ibuprofen  (ADVIL ) 200 MG tablet [id number]  Take 400 mg by mouth every 6 (six) hours as needed for moderate pain. [provider]  Active Self, Pharmacy Records  insulin  glargine, 2 Unit Dial , (TOUJEO  MAX) 300 UNIT/ML  Solostar Pen [id number]  Inject 20 Units into the skin daily. [name], [name], [name]  Active   Insulin  Pen Needle (PEN NEEDLES) 32G X 4 MM MISC [id number]  Use to administer insulin  daily. DX: E11.65 [name], [name], [name]  Active   ipratropium-albuterol  (DUONEB) 0.5-2.5 (3) MG/3ML SOLN [id number]  Take 3 mLs by nebulization every 6 (six) hours as needed (Foir shortness of breath). [name], [name]  Active Self, Pharmacy Records  LORazepam  (ATIVAN ) 0.5 MG tablet [id number]  Take 1 tablet (0.5 mg total) by mouth at bedtime. [name], [name], [name]  Active   magnesium  oxide (MAG-OX) 400 MG tablet [id number]  Take 2 tablets (800 mg total) by mouth 2 (two) times daily. [name], [name], [name]  Active   metoprolol  tartrate (LOPRESSOR ) 50 MG tablet [id number]  Take 1 tablet (50 mg total) by mouth 2 (two) times daily. [name], [name], [name]  Active   nitroGLYCERIN  (NITROSTAT ) 0.4 MG SL tablet [id number]  Place 1 tablet (0.4 mg total) under the tongue every 5 (five) minutes as needed for chest pain. Marcelle, Hepner, PA  Expired 09/06/24 2359 Self  ondansetron  (ZOFRAN -ODT) 8 MG disintegrating tablet [id number]  TAKE 1 TABLET BY MOUTH EVERY 6 HOURS AS NEEDED FOR NAUSEA & VOMITING [name] [name], [name]  Active   rosuvastatin  (CRESTOR )  10 MG tablet [id number]  Take 1 tablet (10 mg total) by mouth daily. [name], [name], [name]  Active   tirzepatide  (MOUNJARO ) 2.5 MG/0.5ML Pen [id number]  Inject 2.5 mg into the skin once a week. Dx E11.65 [name] [name], [name]  Active   Vitamin D , Ergocalciferol , (DRISDOL ) 1.25 MG (50000 UNIT) CAPS capsule [id number]  Take 1 capsule (50,000 Units total) by mouth every 7 (seven) days. Zollie Lowers, MD  Active             Assessment/Plan:   Diabetes: - Currently uncontrolled; goal A1c <7%. Cardiorenal risk reduction is opportunities for improvement.. Blood pressure is at goal <130/80. LDL is not at goal.  - Reviewed long term cardiovascular  and renal outcomes of uncontrolled blood sugar. and Reviewed goal A1c, goal fasting, and goal 2 hour post prandial glucose. Recommended to check glucose using Dexcom G7--samples given -  Patient denies personal or family history of multiple endocrine neoplasia type 2, medullary thyroid  cancer; personal history of pancreatitis or gallbladder disease., Discussed side effects of gastrointestinal upset/nausea; eating smaller meals, avoiding high-fat foods, and remaining upright after eating may reduce nausea. Discussed that overeating is a major trigger of nausea with this class of medications, as often times patients will start to feel full sooner and may need to decrease portion sizes from what they were previously accustomed to.  -CONTINUE mounjaro  2.5 mg weekly   VOUCHER CALLED IN TO PHARMACY ALONG WITH PEPCID  FOR ADDITIONAL GERD SUPPORT -Stop Januvia  -Continue Toujeo  22 units every morning, titrate by 2 untis every 5 days until FBG <150 -f/u statin compliance/discussed Repatha ; myopathies to statins, LDL>180--patient states she will start in the new year -f/u worsening UACR and BP  Follow Up Plan: pharmD in 3 weeks  Mliss Tarry Griffin, PharmD, BCACP, CPP Clinical Pharmacist, [facility]   "

## 2024-10-21 ENCOUNTER — Ambulatory Visit: Payer: Self-pay

## 2024-10-21 ENCOUNTER — Encounter: Payer: Self-pay | Admitting: Nurse Practitioner

## 2024-10-21 ENCOUNTER — Other Ambulatory Visit: Payer: Self-pay | Admitting: Nurse Practitioner

## 2024-10-21 DIAGNOSIS — J0101 Acute recurrent maxillary sinusitis: Secondary | ICD-10-CM

## 2024-10-21 DIAGNOSIS — K219 Gastro-esophageal reflux disease without esophagitis: Secondary | ICD-10-CM

## 2024-10-21 MED ORDER — DOXYCYCLINE HYCLATE 100 MG PO TABS: 100.0000 mg | ORAL_TABLET | Freq: Two times a day (BID) | ORAL | 0 refills | Status: DC

## 2024-10-21 NOTE — Progress Notes (Signed)
 "   Virtual Visit Consent   [name], you are scheduled for a virtual visit with [name], [name], a [facility] provider, today.     Just as with appointments in the office, your consent must be obtained to participate.  Your consent will be active for this visit and any virtual visit you may have with one of our providers in the next 365 days.     If you have a MyChart account, a copy of this consent can be sent to you electronically.  All virtual visits are billed to your insurance company just like a traditional visit in the office.    As this is a virtual visit, video technology does not allow for your provider to perform a traditional examination.  This may limit your provider's ability to fully assess your condition.  If your provider identifies any concerns that need to be evaluated in person or the need to arrange testing (such as labs, EKG, etc.), we will make arrangements to do so.     Although advances in technology are sophisticated, we cannot ensure that it will always work on either your end or our end.  If the connection with a video visit is poor, the visit may have to be switched to a telephone visit.  With either a video or telephone visit, we are not always able to ensure that we have a secure connection.     I need to obtain your verbal consent now.   Are you willing to proceed with your visit today? YES   Melicia C Emberton has provided verbal consent on 10/21/2024 for a virtual visit (video or telephone).   [name], [name]   Date: 10/21/2024 2:19 PM   Virtual Visit via Video Note   I, [name], connected with Brenda Cruz ([id number], Aug 02, 1966) on 10/21/2024 at  3:45 PM EST by a video-enabled telemedicine application and verified that I am speaking with the correct person using two identifiers.  Location: Patient: Virtual Visit Location Patient: Home Provider: Virtual Visit Location Provider: Mobile   I discussed the limitations of  evaluation and management by telemedicine and the availability of in person appointments. The patient expressed understanding and agreed to proceed.    History of Present Illness: Brenda Cruz is a 59 y.o. who identifies as a female who was assigned female at birth, and is being seen today for siusitis.  HPI: Patient had flu 2 weeks ago and now has terrible sinus pressure  Sinusitis This is a new problem. The current episode started in the past 7 days. The maximum temperature recorded prior to her arrival was 100.4 - 100.9 F. The fever has been present for 1 to 2 days. Her pain is at a severity of 8/10. The pain is moderate. Associated symptoms include congestion, headaches, sinus pressure and a sore throat. Pertinent negatives include no coughing. Past treatments include acetaminophen . The treatment provided mild relief.    Review of Systems  HENT:  Positive for congestion, sinus pressure and sore throat.   Respiratory:  Negative for cough.   Neurological:  Positive for headaches.    Problems:  Patient Active Problem List   Diagnosis Date Noted   Dermatitis 08/14/2023   History of lumbar fusion 08/11/2023   Hair loss 08/03/2023   Obstructive sleep apnea 06/21/2023   Ventral hernia without obstruction or gangrene 01/11/2023   Inflammatory disorder of digestive system 01/17/2022   Screen for colon cancer 11/24/2020   History of cancer  of left breast 11/24/2020   History of external beam radiation therapy 11/24/2020   History of lumpectomy of left breast 11/24/2020   Tobacco abuse 07/02/2019   Cancer related pain 03/01/2018   Skin yeast infection 02/07/2018   Hypomagnesemia 01/29/2018   Hypokalemia 01/23/2018   Candida UTI 01/05/2018   Anxiety in cancer patient 12/27/2017   History of infiltrating ductal carcinoma of breast 12/15/2017   Type 2 diabetes mellitus (HCC) 12/15/2017   Family history of breast cancer in first degree relative 12/15/2017   Obesity with body mass index  of 30.0-39.9 12/04/2017   Hyperlipidemia associated with type 2 diabetes mellitus (HCC) 03/15/2017   Stress incontinence of urine 12/07/2016   COPD (chronic obstructive pulmonary disease) (HCC) 12/07/2016   Hx of pulmonary embolus 12/07/2015   Iron deficiency anemia 11/23/2015   S/P CABG x 1 11/02/2015   Coronary artery disease involving native coronary artery of native heart with angina pectoris    Essential hypertension 10/16/2015   GERD (gastroesophageal reflux disease) 05/16/2013   Depression 05/16/2013   GAD (generalized anxiety disorder) 05/16/2013    Allergies: Allergies[1] Medications: Current Medications[2]  Observations/Objective: Patient is well-developed, well-nourished in no acute distress.  Resting comfortably  at home.  Head is normocephalic, atraumatic.  No labored breathing.  Speech is clear and coherent with logical content.  Patient is alert and oriented at baseline.  Maxillary sinus pressure bil Face flushed  Assessment and Plan:  [name] in today with chief complaint of No chief complaint on file.   1. Acute recurrent maxillary sinusitis (Primary) 1. Take meds as prescribed 2. Use a cool mist humidifier especially during the winter months and when heat has been humid. 3. Use saline nose sprays frequently 4. Saline irrigations of the nose can be very helpful if done frequently.  * 4X daily for 1 week*  * Use of a nettie pot can be helpful with this. Follow directions with this* 5. Drink plenty of fluids 6. Keep thermostat turn down low 7.For any cough or congestion- mucinex  OTC 8. For fever or aces or pains- take tylenol  or ibuprofen  appropriate for age and weight.  * for fevers greater than 101 orally you may alternate ibuprofen  and tylenol  every  3 hours.    - doxycycline  (VIBRA -TABS) 100 MG tablet; Take 1 tablet (100 mg total) by mouth 2 (two) times daily. 1 po bid  Dispense: 20 tablet; Refill: 0     Follow Up Instructions: I discussed  the assessment and treatment plan with the patient. The patient was provided an opportunity to ask questions and all were answered. The patient agreed with the plan and demonstrated an understanding of the instructions.  A copy of instructions were sent to the patient via MyChart.  The patient was advised to call back or seek an in-person evaluation if the symptoms worsen or if the condition fails to improve as anticipated.  Time:  I spent 13 minutes with the patient via telehealth technology discussing the above problems/concerns.    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   Iohexol  Itching, Rash and Other (See Comments)    Desc: White blisters in mouth during ivp in [place] '93, ok w/ 13 hour  prep today//a.calhoun, Onset Date: 10/30/1991    Iodinated Contrast Media Itching and Rash   Penicillins Itching and Rash    Has patient had a PCN reaction causing immediate rash, facial/tongue/throat swelling, SOB or lightheadedness with hypotension: Yes Has patient had a PCN reaction causing severe rash involving mucus membranes or skin necrosis: No Has patient had a PCN reaction that required hospitalization: No Has patient had a PCN reaction occurring within the last 10 years: No If all of the above answers are NO, then may proceed with Cephalosporin use.   Sulfa Antibiotics Itching, Rash and Other (See Comments)  [2]  Current Outpatient Medications:    albuterol  (VENTOLIN  HFA) 108 (90 Base) MCG/ACT inhaler, Inhale 2 puffs into the lungs every 4 (four) hours as needed for wheezing or shortness of breath., Disp: 18 g, Rfl: 1   amLODipine  (NORVASC ) 10 MG tablet, Take 1 tablet (10 mg total) by mouth daily., Disp: 90 tablet, Rfl: 1   azithromycin   (ZITHROMAX  Z-PAK) 250 MG tablet, As directed, Disp: 6 tablet, Rfl: 0   blood glucose meter kit and supplies, Dispense based on patient and insurance preference. Use up to four times daily as directed. (FOR ICD-10 E10.9, E11.9)., Disp: 1 each, Rfl: 0   Budeson-Glycopyrrol-Formoterol  (BREZTRI AEROSPHERE) 160-9-4.8 MCG/ACT AERO, Inhale into the lungs., Disp: , Rfl:    Calcium  Carb-Cholecalciferol (CALCIUM  600 + D PO), Take 1 tablet by mouth daily., Disp: , Rfl:    cetirizine  (ZYRTEC ) 10 MG tablet, TAKE ONE (1) TABLET BY MOUTH EVERY DAY, Disp: 30 tablet, Rfl: 4   clobetasol  cream (TEMOVATE ) 0.05 %, Apply 1 Application topically 2 (two) times daily., Disp: 60 g, Rfl: 0   Continuous Glucose Sensor (DEXCOM G7 SENSOR) MISC, Change every 10 days, Disp: 3 each, Rfl: 5   cyclobenzaprine  (FLEXERIL ) 10 MG tablet, TAKE ONE TABLET BY MOUTH THREE TIMES DAILY AS NEEDED FOR MUSCLE SPASM, Disp: 30 tablet, Rfl: 0   diphenhydrAMINE  (BENADRYL ) 50 MG tablet, Take 1 tablet (50 mg total) by mouth once for 1 dose. Take on date/time instructed by prescribing provider.  Take within 1 hour of contrast injection. Call [phone] for questions., Disp: 1 tablet, Rfl: 0   esomeprazole  (NEXIUM ) 40 MG capsule, Take 1 capsule (40 mg total) by mouth daily., Disp: 90 capsule, Rfl: 1   famotidine  (PEPCID ) 40 MG tablet, Take 1 tablet (40 mg total) by mouth daily., Disp: 90 tablet, Rfl: 0   FLUoxetine  (PROZAC ) 20 MG capsule, Take 1 capsule (20 mg total) by mouth daily., Disp: 90 capsule, Rfl: 1   fluticasone  (FLONASE ) 50 MCG/ACT nasal spray, Place 2 sprays into both nostrils daily., Disp: 16 g, Rfl: 6   ibuprofen  (ADVIL ) 200 MG tablet, Take 400 mg by mouth every 6 (six) hours as needed for moderate pain., Disp: , Rfl:    insulin  glargine, 2 Unit Dial , (TOUJEO  MAX) 300 UNIT/ML Solostar Pen, Inject 20 Units into the skin daily., Disp: 6 mL, Rfl: 2   Insulin  Pen Needle (PEN NEEDLES) 32G X 4 MM MISC, Use to administer insulin  daily. DX:  E11.65, Disp: 100 each, Rfl: 1   ipratropium-albuterol  (DUONEB) 0.5-2.5 (3) MG/3ML SOLN, Take 3 mLs by nebulization every 6 (six) hours as needed (Foir shortness of breath)., Disp: 360 mL, Rfl: 0   LORazepam  (ATIVAN ) 0.5 MG tablet, Take 1 tablet (0.5 mg total) by mouth at bedtime., Disp: 90 tablet, Rfl: 1   magnesium  oxide (MAG-OX) 400 MG tablet, Take 2 tablets (800 mg total) by mouth 2 (two) times  daily., Disp: 180 tablet, Rfl: 1   metoprolol  tartrate (LOPRESSOR ) 50 MG tablet, Take 1 tablet (50 mg total) by mouth 2 (two) times daily., Disp: 180 tablet, Rfl: 1   nitroGLYCERIN  (NITROSTAT ) 0.4 MG SL tablet, Place 1 tablet (0.4 mg total) under the tongue every 5 (five) minutes as needed for chest pain., Disp: 90 tablet, Rfl: 0   ondansetron  (ZOFRAN -ODT) 8 MG disintegrating tablet, TAKE 1 TABLET BY MOUTH EVERY 6 HOURS AS NEEDED FOR NAUSEA & VOMITING, Disp: 20 tablet, Rfl: 0   rosuvastatin  (CRESTOR ) 10 MG tablet, Take 1 tablet (10 mg total) by mouth daily., Disp: 90 tablet, Rfl: 3   tirzepatide  (MOUNJARO ) 2.5 MG/0.5ML Pen, Inject 2.5 mg into the skin once a week. Dx E11.65, Disp: 2 mL, Rfl: 0   Vitamin D , Ergocalciferol , (DRISDOL ) 1.25 MG (50000 UNIT) CAPS capsule, Take 1 capsule (50,000 Units total) by mouth every 7 (seven) days., Disp: 13 capsule, Rfl: 3  "

## 2024-10-21 NOTE — Telephone Encounter (Signed)
 Patient seen as a virtual visit today

## 2024-10-21 NOTE — Patient Instructions (Signed)

## 2024-10-21 NOTE — Telephone Encounter (Signed)
 FYI Only or Action Required?: Action required by provider: request for appointment. Refused ED/ Urgent Care.   Would like to be seen tomorrow AM or this afternoon.   No known exposure to mold (visible), no known flooding, water  leaks or humidity issues. Works in a medical setting.   Patient was last seen in primary care on 09/06/2024 by [name], [name].  Called Nurse Triage reporting Sinusitis.  Symptoms began several weeks ago. After having the flu.  Interventions attempted: OTC medications: Multiple- cold/flu, fever reducers, pain reducers, Prescription medications: Inhaler, Rest, hydration, or home remedies, and Ice/heat application.  Symptoms are: gradually worsening.  Triage Disposition: Go to ED Now (or PCP Triage)  Patient/caregiver understands and will follow disposition?: No, wishes to speak with PCP   This Triage RN notified CAL @1400 , Montie spoke with clinic nurse and will reach out to patient.      Copied from CRM [id number]. Topic: Clinical - Red Word Triage >> Oct 21, 2024  1:34 PM Mercer PEDLAR wrote: Red Word that prompted transfer to Nurse Triage: sinus infection, headache, fever, chest tightness.     Reason for Disposition  [1] Redness or swelling on the cheek, forehead or around the eye AND [2] fever    swelling  Answer Assessment - Initial Assessment Questions 1. LOCATION: Where does it hurt?      Both sides of face/sinus region, but left side is worse, glands are swollen, started running fever yesterday. Having headache.   2. ONSET: When did the sinus pain start?  (e.g., hours, days)      Continued symptoms after having influenza 2 weeks  3. SEVERITY: How bad is the pain?   (Scale 0-10; or none, mild, moderate or severe)     5/10 now with Advil  on board, fever improved (came down to 97.21F)  4. RECURRENT SYMPTOM: Have you ever had sinus problems before? If Yes, ask: When was the last time? and What happened that time?      History  of allergies, COPD.   5. NASAL CONGESTION: Is the nose blocked? If Yes, ask: Can you open it or must you breathe through your mouth?     Cheeks, face, above eyes;  severe pain on left side  6. NASAL DISCHARGE: Do you have discharge from your nose? If so ask, What color?     Occasional drainage, feels more stopped up, one side not running like it's supposed to.  Having postnasal drip on occasion.   7. FEVER: Do you have a fever? If Yes, ask: What is it, how was it measured, and when did it start?      Started yesterday- 100.1 F tympanic, usually runs 74F range, improved with medication  8. OTHER SYMPTOMS: Do you have any other symptoms? (e.g., sore throat, cough, earache, difficulty breathing)     Teeth pain, some tightness in chest- using inhaler, when coughing it hurts really bad.  Not coughing anything up. Dark gold/ thick brown mucus when productive.  History of COPD with wheezing.  Doesn't use inhaler very often but using now with illness.  Also has diabetes- currently sugars are 176.   Speaks with ease full sentences during call, no audible wheezing.   Treatments Tried:  Heat, massage, alternating Tylenol  and Advil , if taking cold medicine had those meds in it.   Also took AlkaSeltzer cold/flu, Robitussin cold/sinus, Afrin, TheraFlu, Nasanex, Allergy Medicine, monitoring blood sugars and fever. Using humidifier.  Has not needed to use nebulizer but has available.  No known exposure to mold (visible), no known flooding, water  leaks or humidity issues. Works in a medical setting.  Protocols used: Sinus Pain or Congestion-A-AH

## 2024-10-24 ENCOUNTER — Other Ambulatory Visit (HOSPITAL_COMMUNITY): Payer: Self-pay

## 2024-10-24 ENCOUNTER — Telehealth: Payer: Self-pay | Admitting: Pharmacy Technician

## 2024-10-24 NOTE — Telephone Encounter (Signed)
 PA request has been Submitted. New Encounter has been or will be created for follow up. For additional info see Pharmacy Prior Auth telephone encounter from 10/24/24.

## 2024-10-24 NOTE — Telephone Encounter (Signed)
 noted

## 2024-10-24 NOTE — Telephone Encounter (Signed)
 Pharmacy Patient Advocate Encounter   Received notification from Pt Calls Messages that prior authorization for Dexcom G7 sensor is required/requested.   Insurance verification completed.   The patient is insured through [facility].   Per test claim: PA required; PA submitted to above mentioned insurance via Prompt PA Key/confirmation #/[id number] Status is pending

## 2024-10-24 NOTE — Telephone Encounter (Signed)
 Pharmacy Patient Advocate Encounter   Received notification from Pt Calls Messages that prior authorization for Mounjaro  2.5 mg/0.30ml pen is required/requested.   Insurance verification completed.   The patient is insured through [facility].   Per test claim: PA required; PA submitted to above mentioned insurance via Prompt PA Key/confirmation #/[id number] Status is pending

## 2024-10-25 ENCOUNTER — Emergency Department (HOSPITAL_BASED_OUTPATIENT_CLINIC_OR_DEPARTMENT_OTHER): Admitting: Radiology

## 2024-10-25 ENCOUNTER — Telehealth: Payer: Self-pay

## 2024-10-25 ENCOUNTER — Other Ambulatory Visit: Payer: Self-pay

## 2024-10-25 ENCOUNTER — Other Ambulatory Visit (HOSPITAL_COMMUNITY): Payer: Self-pay

## 2024-10-25 ENCOUNTER — Observation Stay (HOSPITAL_BASED_OUTPATIENT_CLINIC_OR_DEPARTMENT_OTHER)
Admission: EM | Admit: 2024-10-25 | Discharge: 2024-10-26 | Disposition: A | Attending: Emergency Medicine | Admitting: Emergency Medicine

## 2024-10-25 DIAGNOSIS — Z743 Need for continuous supervision: Secondary | ICD-10-CM | POA: Diagnosis not present

## 2024-10-25 DIAGNOSIS — E1169 Type 2 diabetes mellitus with other specified complication: Secondary | ICD-10-CM | POA: Diagnosis present

## 2024-10-25 DIAGNOSIS — I160 Hypertensive urgency: Secondary | ICD-10-CM | POA: Insufficient documentation

## 2024-10-25 DIAGNOSIS — D75829 Heparin-induced thrombocytopenia, unspecified: Secondary | ICD-10-CM | POA: Diagnosis not present

## 2024-10-25 DIAGNOSIS — K219 Gastro-esophageal reflux disease without esophagitis: Secondary | ICD-10-CM | POA: Diagnosis not present

## 2024-10-25 DIAGNOSIS — Z86718 Personal history of other venous thrombosis and embolism: Secondary | ICD-10-CM | POA: Diagnosis not present

## 2024-10-25 DIAGNOSIS — Z853 Personal history of malignant neoplasm of breast: Secondary | ICD-10-CM | POA: Insufficient documentation

## 2024-10-25 DIAGNOSIS — Z951 Presence of aortocoronary bypass graft: Secondary | ICD-10-CM

## 2024-10-25 DIAGNOSIS — Z79899 Other long term (current) drug therapy: Secondary | ICD-10-CM | POA: Diagnosis not present

## 2024-10-25 DIAGNOSIS — J449 Chronic obstructive pulmonary disease, unspecified: Secondary | ICD-10-CM | POA: Diagnosis present

## 2024-10-25 DIAGNOSIS — Z794 Long term (current) use of insulin: Secondary | ICD-10-CM | POA: Diagnosis not present

## 2024-10-25 DIAGNOSIS — R079 Chest pain, unspecified: Principal | ICD-10-CM

## 2024-10-25 DIAGNOSIS — E1122 Type 2 diabetes mellitus with diabetic chronic kidney disease: Secondary | ICD-10-CM | POA: Insufficient documentation

## 2024-10-25 DIAGNOSIS — Z7901 Long term (current) use of anticoagulants: Secondary | ICD-10-CM | POA: Insufficient documentation

## 2024-10-25 DIAGNOSIS — Z720 Tobacco use: Secondary | ICD-10-CM | POA: Diagnosis present

## 2024-10-25 DIAGNOSIS — I2510 Atherosclerotic heart disease of native coronary artery without angina pectoris: Secondary | ICD-10-CM

## 2024-10-25 DIAGNOSIS — N183 Chronic kidney disease, stage 3 unspecified: Secondary | ICD-10-CM | POA: Insufficient documentation

## 2024-10-25 DIAGNOSIS — I200 Unstable angina: Secondary | ICD-10-CM | POA: Diagnosis present

## 2024-10-25 DIAGNOSIS — E785 Hyperlipidemia, unspecified: Secondary | ICD-10-CM

## 2024-10-25 DIAGNOSIS — G4733 Obstructive sleep apnea (adult) (pediatric): Secondary | ICD-10-CM | POA: Diagnosis not present

## 2024-10-25 DIAGNOSIS — I10 Essential (primary) hypertension: Secondary | ICD-10-CM | POA: Diagnosis not present

## 2024-10-25 DIAGNOSIS — F17210 Nicotine dependence, cigarettes, uncomplicated: Secondary | ICD-10-CM | POA: Diagnosis not present

## 2024-10-25 DIAGNOSIS — I25110 Atherosclerotic heart disease of native coronary artery with unstable angina pectoris: Secondary | ICD-10-CM | POA: Diagnosis not present

## 2024-10-25 DIAGNOSIS — I129 Hypertensive chronic kidney disease with stage 1 through stage 4 chronic kidney disease, or unspecified chronic kidney disease: Secondary | ICD-10-CM | POA: Diagnosis not present

## 2024-10-25 DIAGNOSIS — N1831 Chronic kidney disease, stage 3a: Secondary | ICD-10-CM | POA: Diagnosis not present

## 2024-10-25 DIAGNOSIS — Z86711 Personal history of pulmonary embolism: Secondary | ICD-10-CM | POA: Diagnosis not present

## 2024-10-25 DIAGNOSIS — R531 Weakness: Secondary | ICD-10-CM | POA: Diagnosis not present

## 2024-10-25 DIAGNOSIS — E119 Type 2 diabetes mellitus without complications: Secondary | ICD-10-CM

## 2024-10-25 LAB — TROPONIN T, HIGH SENSITIVITY
Troponin T High Sensitivity: 60 ng/L — ABNORMAL HIGH (ref 0–19)
Troponin T High Sensitivity: 60 ng/L — ABNORMAL HIGH (ref 0–19)
Troponin T High Sensitivity: 48 ng/L — ABNORMAL HIGH (ref 0–19)
Troponin T High Sensitivity: 49 ng/L — ABNORMAL HIGH (ref 0–19)

## 2024-10-25 LAB — HEMOGLOBIN A1C
Hgb A1c MFr Bld: 9.4 % — ABNORMAL HIGH (ref 4.8–5.6)
Mean Plasma Glucose: 223.08 mg/dL

## 2024-10-25 LAB — CBC
HCT: 39.4 % (ref 36.0–46.0)
Hemoglobin: 13 g/dL (ref 12.0–15.0)
MCH: 29.5 pg (ref 26.0–34.0)
MCHC: 33 g/dL (ref 30.0–36.0)
MCV: 89.3 fL (ref 80.0–100.0)
Platelets: 278 K/uL (ref 150–400)
RBC: 4.41 MIL/uL (ref 3.87–5.11)
RDW: 12.4 % (ref 11.5–15.5)
WBC: 11.2 K/uL — ABNORMAL HIGH (ref 4.0–10.5)
nRBC: 0 % (ref 0.0–0.2)

## 2024-10-25 LAB — BASIC METABOLIC PANEL WITH GFR
Anion gap: 10 (ref 5–15)
BUN: 12 mg/dL (ref 6–20)
CO2: 28 mmol/L (ref 22–32)
Calcium: 10.5 mg/dL — ABNORMAL HIGH (ref 8.9–10.3)
Chloride: 99 mmol/L (ref 98–111)
Creatinine, Ser: 1.17 mg/dL — ABNORMAL HIGH (ref 0.44–1.00)
GFR, Estimated: 54 mL/min — ABNORMAL LOW
Glucose, Bld: 169 mg/dL — ABNORMAL HIGH (ref 70–99)
Potassium: 4.3 mmol/L (ref 3.5–5.1)
Sodium: 137 mmol/L (ref 135–145)

## 2024-10-25 LAB — D-DIMER, QUANTITATIVE: D-Dimer, Quant: 0.88 ug{FEU}/mL — ABNORMAL HIGH (ref 0.00–0.50)

## 2024-10-25 LAB — HIV ANTIBODY (ROUTINE TESTING W REFLEX): HIV Screen 4th Generation wRfx: NONREACTIVE

## 2024-10-25 LAB — GLUCOSE, CAPILLARY: Glucose-Capillary: 237 mg/dL — ABNORMAL HIGH (ref 70–99)

## 2024-10-25 LAB — CBG MONITORING, ED: Glucose-Capillary: 232 mg/dL — ABNORMAL HIGH (ref 70–99)

## 2024-10-25 MED ORDER — MAGNESIUM OXIDE -MG SUPPLEMENT 400 (240 MG) MG PO TABS
800.0000 mg | ORAL_TABLET | Freq: Two times a day (BID) | ORAL | Status: DC
  Administered 2024-10-25 – 2024-10-26 (×2): 800 mg via ORAL
  Filled 2024-10-25 (×2): qty 2

## 2024-10-25 MED ORDER — INSULIN ASPART 100 UNIT/ML IJ SOLN
0.0000 [IU] | Freq: Three times a day (TID) | INTRAMUSCULAR | Status: DC
  Administered 2024-10-25: 3 [IU] via SUBCUTANEOUS
  Administered 2024-10-26 (×2): 2 [IU] via SUBCUTANEOUS
  Filled 2024-10-25 (×2): qty 2
  Filled 2024-10-25: qty 3

## 2024-10-25 MED ORDER — ALBUTEROL SULFATE (2.5 MG/3ML) 0.083% IN NEBU: 3.0000 mL | INHALATION_SOLUTION | RESPIRATORY_TRACT | Status: DC | PRN

## 2024-10-25 MED ORDER — ONDANSETRON HCL 4 MG PO TABS: 4.0000 mg | ORAL_TABLET | Freq: Four times a day (QID) | ORAL | Status: DC | PRN

## 2024-10-25 MED ORDER — ASPIRIN 81 MG PO CHEW
324.0000 mg | CHEWABLE_TABLET | Freq: Once | ORAL | Status: AC
  Administered 2024-10-25: 324 mg via ORAL
  Filled 2024-10-25: qty 4

## 2024-10-25 MED ORDER — ASPIRIN 81 MG PO TBEC
81.0000 mg | DELAYED_RELEASE_TABLET | Freq: Every day | ORAL | Status: DC
  Administered 2024-10-26: 81 mg via ORAL
  Filled 2024-10-25: qty 1

## 2024-10-25 MED ORDER — ASPIRIN 325 MG PO TBEC: 325.0000 mg | DELAYED_RELEASE_TABLET | Freq: Every day | ORAL | Status: DC

## 2024-10-25 MED ORDER — ONDANSETRON HCL 4 MG/2ML IJ SOLN
4.0000 mg | Freq: Four times a day (QID) | INTRAMUSCULAR | Status: DC | PRN
  Administered 2024-10-25: 4 mg via INTRAVENOUS
  Filled 2024-10-25: qty 2

## 2024-10-25 MED ORDER — ACETAMINOPHEN 325 MG PO TABS
650.0000 mg | ORAL_TABLET | ORAL | Status: DC | PRN
  Administered 2024-10-25 – 2024-10-26 (×2): 650 mg via ORAL
  Filled 2024-10-25 (×2): qty 2

## 2024-10-25 MED ORDER — HYDRALAZINE HCL 20 MG/ML IJ SOLN
10.0000 mg | INTRAMUSCULAR | Status: DC | PRN
  Administered 2024-10-25: 10 mg via INTRAVENOUS
  Filled 2024-10-25: qty 1

## 2024-10-25 MED ORDER — NITROGLYCERIN 2 % TD OINT
0.5000 [in_us] | TOPICAL_OINTMENT | Freq: Four times a day (QID) | TRANSDERMAL | Status: DC
  Administered 2024-10-25 – 2024-10-26 (×3): 0.5 [in_us] via TOPICAL
  Filled 2024-10-25 (×2): qty 1

## 2024-10-25 MED ORDER — NITROGLYCERIN 0.4 MG SL SUBL
0.4000 mg | SUBLINGUAL_TABLET | Freq: Once | SUBLINGUAL | Status: AC
  Administered 2024-10-25: 0.4 mg via SUBLINGUAL
  Filled 2024-10-25: qty 1

## 2024-10-25 MED ORDER — NITROGLYCERIN 0.4 MG SL SUBL: 0.4000 mg | SUBLINGUAL_TABLET | SUBLINGUAL | Status: DC | PRN

## 2024-10-25 MED ORDER — METOPROLOL TARTRATE 50 MG PO TABS
50.0000 mg | ORAL_TABLET | Freq: Two times a day (BID) | ORAL | Status: DC
  Administered 2024-10-25: 50 mg via ORAL
  Filled 2024-10-25 (×2): qty 1

## 2024-10-25 MED ORDER — PANTOPRAZOLE SODIUM 40 MG PO TBEC
40.0000 mg | DELAYED_RELEASE_TABLET | Freq: Every day | ORAL | Status: DC
  Administered 2024-10-25 – 2024-10-26 (×2): 40 mg via ORAL
  Filled 2024-10-25 (×2): qty 1

## 2024-10-25 MED ORDER — ACETAMINOPHEN 325 MG PO TABS: 650.0000 mg | ORAL_TABLET | Freq: Four times a day (QID) | ORAL | Status: DC | PRN

## 2024-10-25 MED ORDER — INSULIN GLARGINE-YFGN 100 UNIT/ML ~~LOC~~ SOLN
9.0000 [IU] | Freq: Every day | SUBCUTANEOUS | Status: DC
  Administered 2024-10-25 – 2024-10-26 (×2): 9 [IU] via SUBCUTANEOUS
  Filled 2024-10-25: qty 90
  Filled 2024-10-25: qty 0.09

## 2024-10-25 MED ORDER — NICOTINE 21 MG/24HR TD PT24
21.0000 mg | MEDICATED_PATCH | Freq: Every day | TRANSDERMAL | Status: DC
  Filled 2024-10-25 (×2): qty 1

## 2024-10-25 NOTE — Assessment & Plan Note (Addendum)
 Noted moderate central chest pain with radiation up the neck and down the left arm since yesterday evening Baseline history of CABG x 1 by Dr. Kerrin on 11/02/2015 with LIMA to her LAD  Most recent stress test 2/ 2024 within normal limits EKG grossly stable Troponins flat in the 60s Heart score 5+ TIMI score 4 Status post full dose aspirin  as well as nitroglycerin  in the ER Per ER physician Dr. 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

## 2024-10-25 NOTE — Assessment & Plan Note (Signed)
 1 PPD smoker  Discussed cessation at length  Pt not ready to quit  Nicotine  patch

## 2024-10-25 NOTE — Assessment & Plan Note (Signed)
 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

## 2024-10-25 NOTE — Assessment & Plan Note (Signed)
 Cr 1.17 w/ GFR in the 50s  Appears near baseline  Minimize nephrotoxic agents Monitor

## 2024-10-25 NOTE — Assessment & Plan Note (Signed)
 PPI

## 2024-10-25 NOTE — Assessment & Plan Note (Signed)
 Stable from a respitatory standpoint  Cont home inhalers

## 2024-10-25 NOTE — Telephone Encounter (Signed)
 Copied from CRM #[id number]. Topic: General - Other >> Oct 25, 2024 10:57 AM Selinda RAMAN wrote: Reason for CRM: The patient called in requesting to speak with Lorn do to recent high blood pressure readings. She refused to be triaged. I called and spoke with Montie and she talked with her provider who said to tell the patient to go to the ER. She said I knew she was going to say that and I'm just not going to do that She said to tell that to the provider and that she was just going to go home.

## 2024-10-25 NOTE — Assessment & Plan Note (Signed)
-   CPAP

## 2024-10-25 NOTE — ED Notes (Signed)
 [facility] at bedside

## 2024-10-25 NOTE — ED Provider Notes (Signed)
 " [place] EMERGENCY DEPARTMENT AT [facility] Provider Note   CSN: [id number] Arrival date & time: 10/25/24  1124     History Chief Complaint  Patient presents with   Chest Pain   HPI: Brenda Cruz is a 59 y.o. female with history perinent for CAD status post prior CABG, GERD, depression, anxiety, HTN, HLD, prior pulmonary embolism, breast cancer, T2DM who presents complaining of chest pain. Patient arrived via POV.  History provided by patient.  No interpreter required during this encounter.  Patient reports that Brenda Cruz was in her normal state of health until yesterday when Brenda Cruz was awoken at night with some chest pain that radiated to her left shoulder.  Reports that Brenda Cruz took her blood pressure and it was elevated to the 200s, therefore Brenda Cruz took her home Lopressor  and had improvement.  Reports that the chest pain has been ongoing, however reworsened today while Brenda Cruz was at work.  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  Patient reports that Brenda Cruz does have substernal chest pressure radiating to her left shoulder as well as her left jaw.  Reports that Brenda Cruz has been adherent to her home antihypertensives.  Reports that Brenda Cruz did have some shortness of breath last night with her episode, none currently, no diaphoresis, nausea, vomiting.  Radiation pain to her jaw, patient does request that her bilateral ears are checked.  Patient's recorded medical, surgical, social, medication list and allergies were reviewed in the Snapshot window as part of the initial history.   Prior to Admission medications  Medication Sig Start Date End Date Taking? Authorizing Provider  albuterol  (VENTOLIN  HFA) 108 (90 Base) MCG/ACT inhaler Inhale 2 puffs into the lungs every 4 (four) hours as needed for  wheezing or shortness of breath. 12/30/19  Yes Joshua Clayborne RAMAN, PA-C  amLODipine  (NORVASC ) 10 MG tablet Take 1 tablet (10 mg total) by mouth daily. 08/23/24  Yes Martin, [name], [name]  Calcium  Carb-Cholecalciferol (CALCIUM  600 + D PO) Take 1 tablet by mouth daily.   Yes [provider]  cetirizine  (ZYRTEC ) 10 MG tablet TAKE ONE (1) TABLET BY MOUTH EVERY DAY 09/08/23  Yes [name], [name], [name]  Continuous Glucose Sensor (DEXCOM G7 SENSOR) MISC Change every 10 days 09/06/24  Yes [name], [name], [name]  cyclobenzaprine  (FLEXERIL ) 10 MG tablet TAKE ONE TABLET BY MOUTH THREE TIMES DAILY AS NEEDED FOR MUSCLE SPASM 11/28/22  Yes [name], [name], [name]  diphenhydrAMINE  (BENADRYL ) 50 MG tablet Take 1 tablet (50 mg total) by mouth once for 1 dose. Take on date/time instructed by prescribing provider.  Take within 1 hour of contrast injection. Call [phone] for questions. 11/07/23 07/25/25 Yes Pappayliou, Dorothyann A, DO  doxycycline  (VIBRA -TABS) 100 MG tablet Take 1 tablet (100 mg total) by mouth 2 (two) times daily. 1 po bid 10/21/24  Yes Martin, [name], [name]  esomeprazole  (NEXIUM ) 40 MG capsule Take 1 capsule (40 mg total) by mouth daily. 08/23/24  Yes Martin, [name], [name]  famotidine  (PEPCID ) 40 MG tablet Take 1 tablet (40 mg total) by mouth daily. 10/15/24  Yes Martin, [name], [name]  FLUoxetine  (PROZAC ) 20 MG capsule Take 1 capsule (20 mg total) by mouth daily. 08/23/24  Yes Martin, [name], [name]  fluticasone  (FLONASE ) 50 MCG/ACT nasal spray Place 2 sprays into both nostrils daily. 09/28/22  Yes Ijaola, Onyeje  M, NP  ibuprofen  (ADVIL ) 200 MG tablet Take 400 mg by mouth every 6 (six) hours as needed for moderate pain.   Yes [provider]  insulin  glargine, 2 Unit Dial , (TOUJEO  MAX) 300 UNIT/ML Solostar Pen Inject 20 Units into the skin daily. Patient taking differently: Inject 22 Units into the skin daily. 09/09/24  Yes Martin, [name], [name]   ipratropium-albuterol  (DUONEB) 0.5-2.5 (3) MG/3ML SOLN Take 3 mLs by nebulization every 6 (six) hours as needed (Foir shortness of breath). 11/18/22  Yes Martin, [name], [name]  LORazepam  (ATIVAN ) 0.5 MG tablet Take 1 tablet (0.5 mg total) by mouth at bedtime. 08/23/24  Yes [name], [name], [name]  magnesium  oxide (MAG-OX) 400 MG tablet Take 2 tablets (800 mg total) by mouth 2 (two) times daily. 08/23/24  Yes [name], [name], [name]  metoprolol  tartrate (LOPRESSOR ) 50 MG tablet Take 1 tablet (50 mg total) by mouth 2 (two) times daily. 08/23/24  Yes [name], [name], [name]  azithromycin  (ZITHROMAX  Z-PAK) 250 MG tablet As directed Patient not taking: Reported on 10/25/2024 09/06/24   [name], [name]  blood glucose meter kit and supplies Dispense based on patient and insurance preference. Use up to four times daily as directed. (FOR ICD-10 E10.9, E11.9). 11/04/19   [name] [name], [name]  clobetasol  cream (TEMOVATE ) 0.05 % Apply 1 Application topically 2 (two) times daily. Patient not taking: Reported on 10/25/2024 08/03/23   Deitra Morton Sebastian Nena, NP  Insulin  Pen Needle (PEN NEEDLES) 32G X 4 MM MISC Use to administer insulin  daily. DX: E11.65 09/16/24   [name], [name], [name]  nitroGLYCERIN  (NITROSTAT ) 0.4 MG SL tablet Place 1 tablet (0.4 mg total) under the tongue every 5 (five) minutes as needed for chest pain. 06/19/19 09/06/24  Madie Jon Garre, PA  ondansetron  (ZOFRAN -ODT) 8 MG disintegrating tablet TAKE 1 TABLET BY MOUTH EVERY 6 HOURS AS NEEDED FOR NAUSEA & VOMITING 04/01/24   [name], [name], [name]  rosuvastatin  (CRESTOR ) 10 MG tablet Take 1 tablet (10 mg total) by mouth daily. 09/06/24   [name], [name], [name]  tirzepatide  (MOUNJARO ) 2.5 MG/0.5ML Pen Inject 2.5 mg into the skin once a week. Dx E11.65 10/15/24   [name], [name]  Vitamin D , Ergocalciferol , (DRISDOL ) 1.25 MG (50000 UNIT) CAPS capsule Take 1 capsule (50,000 Units total) by mouth every 7  (seven) days. 07/10/23   Zollie Lowers, MD     Allergies: Ace inhibitors, Atorvastatin , Farxiga  [dapagliflozin ], Heparin , Iohexol , Iodinated contrast media, Penicillins, and Sulfa antibiotics   Review of Systems   ROS as per HPI  Physical Exam Updated Vital Signs BP (!) 156/89   Pulse 77   Temp 98.4 F (36.9 C) (Oral)   Resp 18   LMP 06/03/2017   SpO2 98%  Physical Exam Vitals and nursing note reviewed.  Constitutional:      General: Brenda Cruz is not in acute distress.    Appearance: Brenda Cruz is well-developed.  HENT:     Head: Normocephalic and atraumatic.     Right Ear: Tympanic membrane normal.     Left Ear: Tympanic membrane normal.  Eyes:     Conjunctiva/sclera: Conjunctivae normal.  Cardiovascular:     Rate and Rhythm: Normal rate and regular rhythm.     Pulses:          Radial pulses are 2+ on the right side and 2+ on the left side.     Heart sounds: No murmur heard. Pulmonary:     Effort: Pulmonary effort is normal. No respiratory distress.     Breath  sounds: Normal breath sounds.  Abdominal:     Palpations: Abdomen is soft.     Tenderness: There is no abdominal tenderness.  Musculoskeletal:        General: No swelling.     Cervical back: Neck supple.  Skin:    General: Skin is warm and dry.     Capillary Refill: Capillary refill takes less than 2 seconds.  Neurological:     Mental Status: Brenda Cruz is alert.  Psychiatric:        Mood and Affect: Mood normal.     ED Course/ Medical Decision Making/ A&P   Procedures Procedures   Medications Ordered in ED Medications  nitroGLYCERIN  (NITROSTAT ) SL tablet 0.4 mg (0.4 mg Sublingual Given 10/25/24 1228)    Followed by  nitroGLYCERIN  (NITROSTAT ) SL tablet 0.4 mg (has no administration in time range)  ondansetron  (ZOFRAN ) tablet 4 mg (has no administration in time range)    Or  ondansetron  (ZOFRAN ) injection 4 mg (has no administration in time range)  hydrALAZINE  (APRESOLINE ) injection 10 mg (has no administration in  time range)  nitroGLYCERIN  (NITROGLYN) 2 % ointment 0.5 inch (has no administration in time range)  pantoprazole  (PROTONIX ) EC tablet 40 mg (has no administration in time range)  insulin  glargine-yfgn (SEMGLEE ) injection 9 Units (has no administration in time range)  magnesium  oxide (MAG-OX) tablet 800 mg (has no administration in time range)  albuterol  (PROVENTIL ) (2.5 MG/3ML) 0.083% nebulizer solution 3 mL (has no administration in time range)  nicotine  (NICODERM CQ  - dosed in mg/24 hours) patch 21 mg (has no administration in time range)  acetaminophen  (TYLENOL ) tablet 650 mg (has no administration in time range)  aspirin  chewable tablet 324 mg (324 mg Oral Given 10/25/24 1228)    Medical Decision Making:   KLARISSA MCILVAIN is a 59 y.o. female who presents for chest pain as per above.  Physical exam is pertinent for no focal abnormalities.   The differential includes but is not limited to ACS, arrhythmia, pericardial tamponade, pericarditis, myocarditis, pneumonia, pneumothorax, esophageal, tear, perforated abdominal viscous, pulmonary embolism, aortic dissection, costochondritis, musculoskeletal chest wall pain, GERD.  Independent historian: None  External data reviewed: Labs: reviewed prior labs for baseline  Initial Plan:  Screening labs including CBC and Metabolic panel to evaluate for infectious or metabolic etiology of disease.  S x-ray to evaluate for structural/infectious intra-thoracic pathology.  EKG and serial troponin to evaluate for cardiac pathology. Objective evaluation as below reviewed   Labs: Ordered, Independent interpretation, and Details: BMP without AKI, emergent electrolyte derangement.  CBC with mild nonspecific leukocytosis to 11.2.  No anemia or thrombocytopenia.  Initial troponin elevated at 60. Delta troponin 60.  Radiology: Ordered, Independent interpretation, Details: Chest x-ray without focal airspace opacification, cardiomediastinal switcher Intran,  pneumothorax, pleural effusion, bony derangement., and All images reviewed independently.  Agree with radiology report at this time.   DG Chest 2 View Result Date: 10/25/2024 CLINICAL DATA:  Chest pain EXAM: CHEST - 2 VIEW COMPARISON:  Mar 09, 2023 FINDINGS: The heart size and mediastinal contours are within normal limits. Sternotomy wires are noted. Both lungs are clear. The visualized skeletal structures are unremarkable. IMPRESSION: No active cardiopulmonary disease. Electronically Signed   By: Lynwood Landy Raddle M.D.   On: 10/25/2024 12:38    EKG/Medicine tests: Ordered and Independent interpretation EKG Interpretation Date/Time:  Friday October 25 2024 11:33:56 EST Ventricular Rate:  76 PR Interval:  140 QRS Duration:  76 QT Interval:  382 QTC Calculation: 429 R Axis:  15  Text Interpretation: Normal sinus rhythm Nonspecific T wave abnormality Abnormal ECG When compared with ECG of 09-Mar-2023 20:23, PREVIOUS ECG IS PRESENT Confirmed by Rogelia Satterfield ([id number]) on 10/25/2024 12:19:10 PM                Interventions: Aspirin , nitroglycerin   See the EMR for full details regarding lab and imaging results.   The CXR is unremarkable for focal airspace disease.  The patient is afebrile and denies productive cough.  Therefore, I do not suspect Pneumonia. There is no evidence of Pneumothorax on physical exam or on the CXR. CXR shows no evidence of Esophageal Tear and there is no recent intractable emesis or esophageal instrumentation. There is no peritonitis or free air on CXR worrisome for a Perforated Abdominal Viscous.  The patient's pain is not tearing . Pulses are present bilaterally in both the upper and lower extremities. CXR does not show a widened mediastinum. I have a very low suspicion for Aortic Dissection.  Patient was administered ASA as well as nitroglycerin , after nitroglycerin , patient has complete resolution of chest pain as well as radiation of pain to arm and neck, which is  concerning for typical chest pain.  Delta troponin is flat at 60, however patient's presentation is overall concerning for cardiac chest pain, heart score of 8, therefore patient is at high risk of MACE (55 to 65%), therefore do feel that patient warrants admission for further ischemic workup given Brenda Cruz has not had one in recent years.  Consulted cardiology, spoke with Dr. Sheena, who is amenable to plan for admission, states that cardiology can see patient as a consult, and recommends hospitalist admission.  Consulted hospitalist, spoke with Dr. Eldonna who accepted the patient to his service for admission.  Presentation is most consistent with acute life/limb-threatening illness and I did consider and rule out acute life/limb-threatening illness  Discussion of management or test interpretations with external provider(s): Dr. Sheena, cardiology, Dr. Eldonna, hospitalist  Risk Drugs:OTC drugs and Prescription drug management Treatment: Decision regarding hospitalization  Disposition: ADMIT: I believe the patient requires admission for further care and management. The patient was admitted to hospitalist with cardiology consult. Please see inpatient provider note for additional treatment plan details.   MDM generated using voice dictation software and may contain dictation errors.  Please contact me for any clarification or with any questions.  Clinical Impression:  1. Cardiac chest pain   2. Unstable angina (HCC)      Admit   Final Clinical Impression(s) / ED Diagnoses Final diagnoses:  Cardiac chest pain  Unstable angina [facility])    Rx / DC Orders ED Discharge Orders     None        Rogelia Satterfield RAMAN, MD 10/25/24 1513  "

## 2024-10-25 NOTE — ED Notes (Signed)
 Extra Blue Top sent to lab to hold.

## 2024-10-25 NOTE — Assessment & Plan Note (Signed)
 BP 130s-200s/70s-100s  Noncompliant with home norvasc   Likely confounding issue to chest pain  Pending nitropaste placement  Prn Hydralazine  ordered as an adjunctive measure  Monitor

## 2024-10-25 NOTE — ED Triage Notes (Signed)
 Patient reports chest pain since last night and elevated blood pressure systolic of 214 at work. Hx of open heart surgery.

## 2024-10-25 NOTE — ED Notes (Signed)
 Called Nataya at [facility] for transport 17:55-TC

## 2024-10-25 NOTE — Telephone Encounter (Signed)
 Pharmacy Patient Advocate Encounter  Received notification from [facility] that Prior Authorization for Dexcom G7 sensor has been APPROVED from 10/24/24 to 10/23/25. Ran test claim, Copay is $50.00. This test claim was processed through [facility]- copay amounts may vary at other pharmacies due to pharmacy/plan contracts, or as the patient moves through the different stages of their insurance plan.   PA #/Case ID/Reference #: Approval letter has been uploaded to her media tab

## 2024-10-25 NOTE — H&P (Addendum)
 " [facility] History and Physical   Referring Provider: Rogelia MD  Telemedicine Provider: Elspeth Masters MD  Provider Location: [facility]  Patient Location: [facility] Referring Diagnosis: Unstable Angina  Patient Name and DOB verified: Yes  Patient consented to Telemedicine Evaluation:Yes  RN virtual assistant: Brenda Jobs RN  Video encounter time and date:10/25/24 1505   Additional Information:  Transfusion: no CPAP/BIPAP: no O2:no  Foley: no    Patient: Brenda Cruz FMW:[id number] DOB: October 15, 1966 DOA: 10/25/2024 DOS: the patient was seen and examined on 10/25/2024 PCP: Brenda Mustard, [name]  Patient coming from: Home  Chief Complaint:  Chief Complaint  Patient presents with   Chest Pain   HPI: Brenda Cruz is a 59 y.o. 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  Patient reports developing central chest pain with radiation up the neck and down the left arm while in her kitchen last night.  Symptoms moderate to mildly severe in intensity overnight.  Noted baseline history of coronary artery disease status post CABG 2017.  Is followed by Dr. Wadie outpatient.  Most recent stress test April 2024 that was otherwise within normal limits.  Chest pain was constant in nature without reported alleviating or aggravating factors.  Patient states she had some episodes of belching and thought symptoms were possibly GI related etiology.  Still smoking 1 pack/day.  Denies any active alcohol or illicit drug use.  Also with noted pressures at home into the 190s to 200s.  Has not been compliant with home Norvasc  as patient reports cramping associated side effects. Patient took home lopressor .  No shortness of breath.  No abdominal pain.  No focal hemiparesis or confusion.  Had some mild blurry vision prior to onset of chest pain.  No slurred speech or confusion.  Symptoms  persisted into the morning while at work. Does report some intermittent orthopnea and PND. Denies any lower extremity swelling.  Presented to the ER afebrile, BP 130s-200s70s-100s. Satting well on RA. WBC 11.2, hgb 13, plt 270s, Cr 1.17, glucose 160s, troponin in 60s. EKG NSR. CXR WNL.   Review of Systems: As mentioned in the history of present illness. All other systems reviewed and are negative. 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)    Hyperlipemia    Hypertension    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) 12/15/2017   Iron deficiency anemia 11/23/2015   Myocardial infarction [facility]) 2019   widow maker   PONV (postoperative nausea and vomiting)    Pulmonary emboli (HCC) 12/07/2015   [facility] spotted fever    Sleep apnea    TMJ (temporomandibular joint disorder)    Type 2 diabetes mellitus (HCC) 12/15/2017   Past Surgical History:  Procedure Laterality Date   CARDIAC CATHETERIZATION N/A 10/29/2015   Procedure: Left Heart Cath and Coronary Angiography;  Surgeon: Dorn JINNY Lesches, MD;  Location: [facility] INVASIVE CV LAB;  Service: Cardiovascular;  Laterality: N/A;   CHOLECYSTECTOMY     CORONARY ARTERY BYPASS GRAFT N/A 11/02/2015   Procedure: CORONARY ARTERY BYPASS GRAFTING (CABG)x 1 using left internal mammary artery.;  Surgeon: Brenda JAYSON Millers, MD;  Location: [facility] OR;  Service: Open Heart Surgery;  Laterality: N/A;   LEFT OOPHORECTOMY Left    Related to tubal torsion.  Bilateral salpingectomy also.  Remaining uterus and right ovary   LUMBAR FUSION     TEE WITHOUT CARDIOVERSION N/A 11/02/2015   Procedure: TRANSESOPHAGEAL ECHOCARDIOGRAM (TEE);  Surgeon: Brenda JAYSON Millers, MD;  Location: [facility] OR;  Service: Open Heart  Surgery;  Laterality: N/A;   XI ROBOTIC ASSISTED VENTRAL HERNIA N/A 01/11/2023   Procedure: XI ROBOTIC ASSISTED VENTRAL HERNIA REPAIR W/ MESH- SUPRAUMBILICAL;  Surgeon: [name], DO;  Location: [facility] ORS;  Service: General;  Laterality: N/A;   Social History:  reports that she has been smoking cigarettes. She has never used smokeless tobacco. She reports that she does not drink alcohol and does not use drugs.  Allergies[1]  Family History  Problem Relation Age of Onset   Breast cancer Mother    Colon polyps Father    Esophageal cancer Maternal Grandfather    Stomach cancer Paternal Grandmother    Ovarian cancer Other        Maternal Side Great  Aunt   Colon cancer Neg Hx    Rectal cancer Neg Hx     Prior to Admission medications  Medication Sig Start Date End Date Taking? Authorizing Provider  acetaminophen  (TYLENOL ) 500 MG tablet Take 1,000 mg by mouth every 6 (six) hours as needed.   Yes [provider]  albuterol  (VENTOLIN  HFA) 108 (90 Base) MCG/ACT inhaler Inhale 2 puffs into the lungs every 4 (four) hours as needed for wheezing or shortness of breath. 12/30/19  Yes Joshua Clayborne RAMAN, PA-C  amLODipine  (NORVASC ) 10 MG tablet Take 1 tablet (10 mg total) by mouth daily. 08/23/24  Yes Martin, [name], [name]  Calcium  Carb-Cholecalciferol (CALCIUM  600 + D PO) Take 1 tablet by mouth daily.   Yes [provider]  cetirizine  (ZYRTEC ) 10 MG tablet TAKE ONE (1) TABLET BY MOUTH EVERY DAY 09/08/23  Yes Brenda, [name], [name]  cholecalciferol (VITAMIN D3) 25 MCG (1000 UNIT) tablet Take 1,000 Units by mouth daily.   Yes [provider]  Continuous Glucose Sensor (DEXCOM G7 SENSOR) MISC Change every 10 days 09/06/24  Yes Brenda, [name], [name]  cyclobenzaprine  (FLEXERIL ) 10 MG tablet TAKE ONE TABLET BY MOUTH THREE TIMES DAILY AS NEEDED FOR MUSCLE SPASM 11/28/22  Yes Brenda, [name], [name]  diphenhydrAMINE  (BENADRYL ) 50 MG tablet Take 1 tablet (50 mg  total) by mouth once for 1 dose. Take on date/time instructed by prescribing provider.  Take within 1 hour of contrast injection. Call [phone] for questions. 11/07/23 07/25/25 Yes Pappayliou, Dorothyann A, DO  doxycycline  (VIBRA -TABS) 100 MG tablet Take 1 tablet (100 mg total) by mouth 2 (two) times daily. 1 po bid 10/21/24  Yes Martin, [name], [name]  esomeprazole  (NEXIUM ) 40 MG capsule Take 1 capsule (40 mg total) by mouth daily. 08/23/24  Yes Martin, [name], [name]  famotidine  (PEPCID ) 40 MG tablet Take 1 tablet (40 mg total) by mouth daily. 10/15/24  Yes Martin, [name], [name]  FLUoxetine  (PROZAC ) 20 MG capsule Take 1 capsule (20 mg total) by mouth daily. 08/23/24  Yes Martin, [name], [name]  fluticasone  (FLONASE ) 50 MCG/ACT nasal spray Place 2 sprays into both nostrils daily. 09/28/22  Yes Ijaola, Onyeje M, NP  ibuprofen  (ADVIL ) 200 MG tablet Take 400 mg by mouth every 6 (six) hours as needed for moderate pain.   Yes [provider]  insulin  glargine, 2 Unit Dial , (TOUJEO  MAX) 300 UNIT/ML Solostar Pen  Inject 20 Units into the skin daily. Patient taking differently: Inject 22 Units into the skin daily. 09/09/24  Yes Martin, [name], [name]  ipratropium-albuterol  (DUONEB) 0.5-2.5 (3) MG/3ML SOLN Take 3 mLs by nebulization every 6 (six) hours as needed (Foir shortness of breath). 11/18/22  Yes Brenda, [name], [name]  LORazepam  (ATIVAN ) 0.5 MG tablet Take 1 tablet (0.5 mg total) by mouth at bedtime. 08/23/24  Yes Martin, [name], [name]  magnesium  oxide (MAG-OX) 400 MG tablet Take 2 tablets (800 mg total) by mouth 2 (two) times daily. 08/23/24  Yes Brenda, [name], [name]  metoprolol  tartrate (LOPRESSOR ) 50 MG tablet Take 1 tablet (50 mg total) by mouth 2 (two) times daily. 08/23/24  Yes Martin, [name], [name]  ondansetron  (ZOFRAN -ODT) 8 MG disintegrating tablet TAKE 1 TABLET BY MOUTH EVERY 6 HOURS AS NEEDED FOR NAUSEA & VOMITING 04/01/24  Yes Brenda, [name],  [name]  tirzepatide  (MOUNJARO ) 2.5 MG/0.5ML Pen Inject 2.5 mg into the skin once a week. Dx E11.65 10/15/24  Yes Brenda, [name], [name]  azithromycin  (ZITHROMAX  Z-PAK) 250 MG tablet As directed Patient not taking: Reported on 10/25/2024 09/06/24   Brenda Mustard, [name]  blood glucose meter kit and supplies Dispense based on patient and insurance preference. Use up to four times daily as directed. (FOR ICD-10 E10.9, E11.9). 11/04/19   Brenda [name], [name]  clobetasol  cream (TEMOVATE ) 0.05 % Apply 1 Application topically 2 (two) times daily. Patient not taking: Reported on 10/25/2024 08/03/23   Deitra Morton Sebastian Nena, NP  Insulin  Pen Needle (PEN NEEDLES) 32G X 4 MM MISC Use to administer insulin  daily. DX: E11.65 09/16/24   Brenda, [name], [name]  nitroGLYCERIN  (NITROSTAT ) 0.4 MG SL tablet Place 1 tablet (0.4 mg total) under the tongue every 5 (five) minutes as needed for chest pain. 06/19/19 09/06/24  Madie Jon Garre, PA  rosuvastatin  (CRESTOR ) 10 MG tablet Take 1 tablet (10 mg total) by mouth daily. Patient not taking: Reported on 10/25/2024 09/06/24   Brenda Mustard, [name]  Vitamin D , Ergocalciferol , (DRISDOL ) 1.25 MG (50000 UNIT) CAPS capsule Take 1 capsule (50,000 Units total) by mouth every 7 (seven) days. Patient not taking: Reported on 10/25/2024 07/10/23   Zollie Lowers, MD    Physical Exam: Vitals:   10/25/24 1530 10/25/24 1545 10/25/24 1600 10/25/24 1615  BP: (!) 182/83 (!) 165/87 (!) 161/70 (!) 161/96  Pulse:      Resp: 17 19    Temp:      TempSrc:      SpO2:       Physical Exam Constitutional:      Appearance: She is obese.  HENT:     Head: Normocephalic and atraumatic.     Nose: Nose normal.     Mouth/Throat:     Mouth: Mucous membranes are moist.  Eyes:     Pupils: Pupils are equal, round, and reactive to light.  Cardiovascular:     Rate and Rhythm: Normal rate and regular rhythm.  Pulmonary:     Effort: Pulmonary effort is normal.  Abdominal:      General: Bowel sounds are normal.  Musculoskeletal:        General: Normal range of motion.  Skin:    General: Skin is warm.  Neurological:     General: No focal deficit present.  Psychiatric:        Mood and Affect: Mood normal.     Data Reviewed:  There are no new results to review at this time.  DG Chest 2 View CLINICAL DATA:  Chest  pain  EXAM: CHEST - 2 VIEW  COMPARISON:  Mar 09, 2023  FINDINGS: The heart size and mediastinal contours are within normal limits. Sternotomy wires are noted. Both lungs are clear. The visualized skeletal structures are unremarkable.  IMPRESSION: No active cardiopulmonary disease.  Electronically Signed   By: Lynwood Landy Raddle M.D.   On: 10/25/2024 12:38  Lab Results  Component Value Date   WBC 11.2 (H) 10/25/2024   HGB 13.0 10/25/2024   HCT 39.4 10/25/2024   MCV 89.3 10/25/2024   PLT 278 10/25/2024   Last metabolic panel Lab Results  Component Value Date   GLUCOSE 169 (H) 10/25/2024   NA 137 10/25/2024   K 4.3 10/25/2024   CL 99 10/25/2024   CO2 28 10/25/2024   BUN 12 10/25/2024   CREATININE 1.17 (H) 10/25/2024   GFRNONAA 54 (L) 10/25/2024   CALCIUM  10.5 (H) 10/25/2024   PHOS 4.6 (H) 07/04/2023   PROT 7.1 08/23/2024   ALBUMIN  4.3 08/23/2024   LABGLOB 2.8 08/23/2024   AGRATIO 1.6 11/29/2022   BILITOT 0.3 08/23/2024   ALKPHOS 93 08/23/2024   AST 91 (H) 08/23/2024   ALT 71 (H) 08/23/2024   ANIONGAP 10 10/25/2024    Assessment and Plan: * Unstable angina (HCC) Noted moderate central chest pain with radiation up the neck and down the left arm since yesterday evening Baseline history of CABG x 1 by Dr. Kerrin on 11/02/2015 with LIMA to her LAD  Most recent stress test 2/ 2024 within normal limits EKG grossly stable Troponins flat in the 60s Heart score 5+ TIMI score 4 Status post full dose aspirin  as well as nitroglycerin  in the ER Per ER physician Dr. Rogelia, case discussed with cardiology with plan for  evaluation on arrival to [facility] Will continue with as needed nitroglycerin  as well as aspirin  Minimal chest pain at present  Defer heparin  product use in the setting history of HIT-consider argatroban/bivalirudin/fondaparinux  as clinically indicated Follow up cardiology recommendations  Monitor  Chronic kidney disease (CKD), stage 3 (HCC) Cr 1.17 w/ GFR in the 50s  Appears near baseline  Minimize nephrotoxic agents Monitor    Hypertensive urgency BP 130s-200s/70s-100s  Noncompliant with home norvasc   Likely confounding issue to chest pain  Pending nitropaste placement  Prn Hydralazine  ordered as an adjunctive measure  Monitor   Obstructive sleep apnea CPAP   Type 2 diabetes mellitus (HCC) Blood sugars 170s on presentation  Cont home toujeo  at reduced dose  SSI  A1C  Monitor    Tobacco abuse 1 PPD smoker  Discussed cessation at length  Pt not ready to quit  Nicotine  patch    Hyperlipidemia associated with type 2 diabetes mellitus (HCC) Baseline HLD w/ intolerance to statins  Has been in discussion w/ cardiology about repatha   Check lipid panel  Follow up cardiology recommendations    COPD (chronic obstructive pulmonary disease) (HCC) Stable from a respitatory standpoint  Cont home inhalers    Hx of pulmonary embolus HIT + remote history of PE and DVT s/p CABG and HIT  No reports of SOB at present  Will add on d dimer x 1 given overall presentation  HOLD on heparin /lovenox  use  Monitor   GERD (gastroesophageal reflux disease) PPI       Advance Care Planning:   Code Status: Prior   Consults: Cardiology   Family Communication: No family at the bedside   Severity of Illness: The appropriate patient status for this patient is OBSERVATION. Observation status is  judged to be reasonable and necessary in order to provide the required intensity of service to ensure the patient's safety. The patient's presenting symptoms, physical exam findings, and  initial radiographic and laboratory data in the context of their medical condition is felt to place them at decreased risk for further clinical deterioration. Furthermore, it is anticipated that the patient will be medically stable for discharge from the hospital within 2 midnights of admission.   Author: Elspeth JINNY Masters, MD 10/25/2024 5:07 PM  For on call review [email or web address].      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   Iohexol  Itching, Rash and Other (See Comments)    Desc: White blisters in mouth during ivp in [place] '93, ok w/ 13 hour prep today//a.calhoun, Onset Date: 10/30/1991    Iodinated Contrast Media Itching and Rash   Penicillins Itching and Rash    Has patient had a PCN reaction causing immediate rash, facial/tongue/throat swelling, SOB or lightheadedness with hypotension: Yes Has patient had a PCN reaction causing severe rash involving mucus membranes or skin necrosis: No Has patient had a PCN reaction that required hospitalization: No Has patient had a PCN reaction occurring within the last 10 years: No If all of the above answers are NO, then may proceed with Cephalosporin use.   Sulfa Antibiotics Itching, Rash and Other (See Comments)   "

## 2024-10-25 NOTE — Consult Note (Signed)
 "  Cardiology Consultation   Patient ID: Brenda Cruz MRN: [id number]; DOB: [birth year]  Admit date: 10/25/2024 Date of Consult: 10/25/2024  PCP:  [name], [name]   [facility] Providers Cardiologist:  [name], MD  Cardiology APP:  [name], PA-C    Patient Profile: Brenda Cruz is a 59 y.o. 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 Rogelia.  History of Present Illness: Ms. Brenda Cruz developed chest tightness around midnight last night while yelling at her dogs. She states her pain radiated to her left shoulder and left jaw. She also noted that her systolic blood pressure was elevated ~200, and she took her amlodipine . She went to work earlier today and has been having chest pain on and off throughout the day. 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   The patient states she is not very active but did have an episode of chest pain while walking 3 flights of stairs over the summer. She has noticed decreased exercise tolerance over the last several months but has largely a sedentary lifestyle. She continues to smoke 1 ppd and has done so for many years. Unfortunately, she has had intolerance to statins, and her A1c has been elevated (most recently 9% about 2 months ago). Additionally, she has had difficult to control blood pressure.  She has not had any leg swelling, orthopnea, PND, or weight gain over the past several days.  In the ED trop 60-> 60. ECG with SR, nonspecific STT changes.   The patient had coronary angiogram in 10/2015 that revealed single vessel CAD with 80% distal left main  disease and underwent LIMA-LAD CABG on November 02, 2015 with Dr. Kerrin.   She has a strong family history of CAD with her father dying of a massive MI at age 22. Her mother had 6 siblings and all had coronary events in their ~64s.   She had a Nuclear Med SPECT stress test on 12/13/2022 that was read as low risk and no evidence of ischemia.   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)    Hyperlipemia    Hypertension    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) 12/15/2017   Iron deficiency anemia 11/23/2015   Myocardial infarction [facility]) 2019   widow maker   PONV (postoperative nausea and vomiting)    Pulmonary emboli (HCC) 12/07/2015   [facility] spotted fever    Sleep apnea    TMJ (temporomandibular joint disorder)    Type 2 diabetes mellitus (HCC) 12/15/2017    Past Surgical History:  Procedure Laterality Date   CARDIAC CATHETERIZATION N/A 10/29/2015   Procedure: Left Heart Cath and Coronary Angiography;  Surgeon: Dorn JINNY Lesches, MD;  Location: [facility] INVASIVE CV LAB;  Service: Cardiovascular;  Laterality: N/A;   CHOLECYSTECTOMY  CORONARY ARTERY BYPASS GRAFT N/A 11/02/2015   Procedure: CORONARY ARTERY BYPASS GRAFTING (CABG)x 1 using left internal mammary artery.;  Surgeon: Elspeth JAYSON Millers, MD;  Location: [facility] OR;  Service: Open Heart Surgery;  Laterality: N/A;   LEFT OOPHORECTOMY Left    Related to tubal torsion.  Bilateral salpingectomy also.  Remaining uterus and right ovary   LUMBAR FUSION     TEE WITHOUT CARDIOVERSION N/A 11/02/2015   Procedure: TRANSESOPHAGEAL ECHOCARDIOGRAM (TEE);  Surgeon: Elspeth JAYSON Millers, MD;  Location: [facility] OR;  Service: Open Heart Surgery;  Laterality: N/A;   XI ROBOTIC ASSISTED  VENTRAL HERNIA N/A 01/11/2023   Procedure: XI ROBOTIC ASSISTED VENTRAL HERNIA REPAIR W/ MESH- SUPRAUMBILICAL;  Surgeon: [name], DO;  Location: [facility] ORS;  Service: General;  Laterality: N/A;     Home Medications:  Prior to Admission medications  Medication Sig Start Date End Date Taking? Authorizing Provider  acetaminophen  (TYLENOL ) 500 MG tablet Take 1,000 mg by mouth every 6 (six) hours as needed.   Yes [provider]  albuterol  (VENTOLIN  HFA) 108 (90 Base) MCG/ACT inhaler Inhale 2 puffs into the lungs every 4 (four) hours as needed for wheezing or shortness of breath. 12/30/19  Yes Joshua Clayborne RAMAN, PA-C  amLODipine  (NORVASC ) 10 MG tablet Take 1 tablet (10 mg total) by mouth daily. 08/23/24  Yes Martin, [name], [name]  Calcium  Carb-Cholecalciferol (CALCIUM  600 + D PO) Take 1 tablet by mouth daily.   Yes [provider]  cetirizine  (ZYRTEC ) 10 MG tablet TAKE ONE (1) TABLET BY MOUTH EVERY DAY 09/08/23  Yes [name], [name], [name]  cholecalciferol (VITAMIN D3) 25 MCG (1000 UNIT) tablet Take 1,000 Units by mouth daily.   Yes [provider]  Continuous Glucose Sensor (DEXCOM G7 SENSOR) MISC Change every 10 days 09/06/24  Yes [name], [name], [name]  cyclobenzaprine  (FLEXERIL ) 10 MG tablet TAKE ONE TABLET BY MOUTH THREE TIMES DAILY AS NEEDED FOR MUSCLE SPASM 11/28/22  Yes [name], [name], [name]  diphenhydrAMINE  (BENADRYL ) 50 MG tablet Take 1 tablet (50 mg total) by mouth once for 1 dose. Take on date/time instructed by prescribing provider.  Take within 1 hour of contrast injection. Call [phone] for questions. 11/07/23 07/25/25 Yes Pappayliou, Dorothyann A, DO  doxycycline  (VIBRA -TABS) 100 MG tablet Take 1 tablet (100 mg total) by mouth 2 (two) times daily. 1 po bid 10/21/24  Yes Martin, [name], [name]  esomeprazole  (NEXIUM ) 40 MG capsule Take 1 capsule (40 mg total) by mouth daily. 08/23/24  Yes Martin, [name], [name]  famotidine  (PEPCID ) 40  MG tablet Take 1 tablet (40 mg total) by mouth daily. 10/15/24  Yes Martin, [name], [name]  FLUoxetine  (PROZAC ) 20 MG capsule Take 1 capsule (20 mg total) by mouth daily. 08/23/24  Yes Martin, [name], [name]  fluticasone  (FLONASE ) 50 MCG/ACT nasal spray Place 2 sprays into both nostrils daily. 09/28/22  Yes Ijaola, Onyeje M, NP  ibuprofen  (ADVIL ) 200 MG tablet Take 400 mg by mouth every 6 (six) hours as needed for moderate pain.   Yes [provider]  insulin  glargine, 2 Unit Dial , (TOUJEO  MAX) 300 UNIT/ML Solostar Pen Inject 20 Units into the skin daily. Patient taking differently: Inject 22 Units into the skin daily. 09/09/24  Yes Martin, [name], [name]  ipratropium-albuterol  (DUONEB) 0.5-2.5 (3) MG/3ML SOLN Take 3 mLs by nebulization every 6 (six) hours as needed (Foir shortness of breath). 11/18/22  Yes [name], [name], [name]  LORazepam  (ATIVAN ) 0.5 MG tablet Take 1 tablet (0.5 mg total) by mouth at bedtime. 08/23/24  Yes [name], [name], [name]  magnesium  oxide (MAG-OX) 400 MG tablet Take 2 tablets (800 mg total) by mouth 2 (two) times daily. 08/23/24  Yes [name], [name], [name]  metoprolol  tartrate (LOPRESSOR ) 50 MG tablet Take 1 tablet (50 mg total) by mouth 2 (two) times daily. 08/23/24  Yes Martin, [name], [name]  ondansetron  (ZOFRAN -ODT) 8 MG disintegrating tablet TAKE 1 TABLET BY MOUTH EVERY 6 HOURS AS NEEDED FOR NAUSEA & VOMITING 04/01/24  Yes [name], [name], [name]  tirzepatide  (MOUNJARO ) 2.5 MG/0.5ML Pen Inject 2.5 mg into the skin once a week. Dx E11.65 10/15/24  Yes [name], [name], [name]  azithromycin  (ZITHROMAX  Z-PAK) 250 MG tablet As directed Patient not taking: Reported on 10/25/2024 09/06/24   [name], [name]  blood glucose meter kit and supplies Dispense based on patient and insurance preference. Use up to four times daily as directed. (FOR ICD-10 E10.9, E11.9). 11/04/19   [name] [name], [name]  clobetasol  cream (TEMOVATE ) 0.05 %  Apply 1 Application topically 2 (two) times daily. Patient not taking: Reported on 10/25/2024 08/03/23   Deitra Morton Sebastian Nena, NP  Insulin  Pen Needle (PEN NEEDLES) 32G X 4 MM MISC Use to administer insulin  daily. DX: E11.65 09/16/24   [name], [name]  nitroGLYCERIN  (NITROSTAT ) 0.4 MG SL tablet Place 1 tablet (0.4 mg total) under the tongue every 5 (five) minutes as needed for chest pain. 06/19/19 09/06/24  Madie Jon Garre, PA  rosuvastatin  (CRESTOR ) 10 MG tablet Take 1 tablet (10 mg total) by mouth daily. Patient not taking: Reported on 10/25/2024 09/06/24   [name], [name]  Vitamin D , Ergocalciferol , (DRISDOL ) 1.25 MG (50000 UNIT) CAPS capsule Take 1 capsule (50,000 Units total) by mouth every 7 (seven) days. Patient not taking: Reported on 10/25/2024 07/10/23   Zollie Lowers, MD    Scheduled Meds:  NOREEN ON 10/26/2024] aspirin  EC  325 mg Oral Daily   insulin  aspart  0-9 Units Subcutaneous TID WC   insulin  glargine-yfgn  9 Units Subcutaneous Daily   magnesium  oxide  800 mg Oral BID   metoprolol  tartrate  50 mg Oral BID   nicotine   21 mg Transdermal Daily   nitroGLYCERIN   0.5 inch Topical Q6H   pantoprazole   40 mg Oral Daily   Continuous Infusions:  PRN Meds: acetaminophen , albuterol , hydrALAZINE , [COMPLETED] nitroGLYCERIN  **FOLLOWED BY** nitroGLYCERIN , ondansetron  **OR** ondansetron  (ZOFRAN ) IV  Allergies:   Allergies[1]  Social History:   Social History   Socioeconomic History   Marital status: Married    Spouse name: Not on file   Number of children: 1   Years of education: Not on file   Highest education level: Associate degree: occupational, scientist, product/process development, or vocational program  Occupational History   Occupation: Designer, Jewellery  Tobacco Use   Smoking status: Every Day    Current packs/day: 1.00    Types: Cigarettes   Smokeless tobacco: Never   Tobacco comments:    Patient has been smoking 38years//PAP 8/12  Vaping Use   Vaping status: Never Used   Substance and Sexual Activity   Alcohol use: No    Alcohol/week: 0.0 standard drinks of alcohol   Drug use: No   Sexual activity: Not on file  Other Topics Concern   Not on file  Social History Narrative   2 caffeine drinks daily    Social Drivers of Health   Tobacco Use: High Risk (10/21/2024)   Patient History    Smoking Tobacco Use: Every Day    Smokeless Tobacco Use: Never    Passive Exposure:  Not on file  Financial Resource Strain: Low Risk (04/11/2024)   Overall Financial Resource Strain (CARDIA)    Difficulty of Paying Living Expenses: Not very hard  Food Insecurity: No Food Insecurity (10/25/2024)   Epic    Worried About Programme Researcher, Broadcasting/film/video in the Last Year: Never true    Ran Out of Food in the Last Year: Never true  Transportation Needs: No Transportation Needs (10/25/2024)   Epic    Lack of Transportation (Medical): No    Lack of Transportation (Non-Medical): No  Physical Activity: Insufficiently Active (04/11/2024)   Exercise Vital Sign    Days of Exercise per Week: 1 day    Minutes of Exercise per Session: 10 min  Stress: Stress Concern Present (04/11/2024)   [employer] of Occupational Health - Occupational Stress Questionnaire    Feeling of Stress: To some extent  Social Connections: Moderately Isolated (10/25/2024)   Social Connection and Isolation Panel    Frequency of Communication with Friends and Family: More than three times a week    Frequency of Social Gatherings with Friends and Family: More than three times a week    Attends Religious Services: Never    Database Administrator or Organizations: No    Attends Banker Meetings: Never    Marital Status: Married  Catering Manager Violence: Not At Risk (10/25/2024)   Epic    Fear of Current or Ex-Partner: No    Emotionally Abused: No    Physically Abused: No    Sexually Abused: No  Depression (PHQ2-9): Low Risk (09/06/2024)   Depression (PHQ2-9)    PHQ-2 Score: 0  Recent Concern:  Depression (PHQ2-9) - Medium Risk (07/25/2024)   Depression (PHQ2-9)    PHQ-2 Score: 5  Alcohol Screen: Low Risk (04/11/2024)   Alcohol Screen    Last Alcohol Screening Score (AUDIT): 1  Housing: Unknown (10/25/2024)   Epic    Unable to Pay for Housing in the Last Year: No    Number of Times Moved in the Last Year: Not on file    Homeless in the Last Year: No  Utilities: Not At Risk (10/25/2024)   Epic    Threatened with loss of utilities: No  Health Literacy: Not on file    Family History:   Significant family history of early CAD Family History  Problem Relation Age of Onset   Breast cancer Mother    Colon polyps Father    Esophageal cancer Maternal Grandfather    Stomach cancer Paternal Grandmother    Ovarian cancer Other        Maternal Side Great  Aunt   Colon cancer Neg Hx    Rectal cancer Neg Hx     ROS:  Please see the history of present illness.  All other ROS reviewed and negative.     Physical Exam/Data: Vitals:   10/25/24 1800 10/25/24 1815 10/25/24 1909 10/25/24 1924  BP: (!) 175/73 (!) 167/74 (!) 176/129 (!) 191/91  Pulse:      Resp: (!) 22 12    Temp:   98 F (36.7 C)   TempSrc:   Oral   SpO2:   100%    No intake or output data in the 24 hours ending 10/25/24 2024    09/06/2024    9:56 AM 08/23/2024    4:17 PM 07/25/2024    3:23 PM  Last 3 Weights  Weight (lbs) 200 lb 200 lb 202 lb 9.6 oz  Weight (kg) 90.719 kg  90.719 kg 91.899 kg     Body mass index is 30.83 kg/m.  General:  Well nourished, well developed, in no acute distress HEENT: normal Neck: no JVD Vascular: No carotid bruits; Distal pulses 2+ bilaterally Cardiac:  normal S1, S2; RRR; no murmur  Lungs:  clear to auscultation bilaterally, no wheezing, rhonchi or rales  Abd: soft, nontender, no hepatomegaly  Ext: no edema Musculoskeletal:  No deformities, BUE and BLE strength normal and equal Skin: warm and dry  Neuro:  no focal abnormalities noted Psych:  Normal affect   EKG:  The EKG  was personally reviewed and demonstrates:  SR, non specific STT wave changes, no STEMI criteria, no q waves Telemetry:  Telemetry was personally reviewed and demonstrates:  SR without significant ectopy  Relevant CV Studies:  TTE 05/11/2017 Study Conclusions  - Left ventricle: The cavity size was normal. Wall thickness was    normal. Systolic function was normal. The estimated ejection    fraction was in the range of 55% to 60%. Wall motion was normal;    there were no regional wall motion abnormalities. Left    ventricular diastolic function parameters were normal.  - Mitral valve: Calcified annulus.   Impressions:  - Normal LV systolic and diastolic function.   Echo dobutamine  stress test 03/15/2017 Impressions:  - Negative [place] echo.    No evidence of ischemia .    Normal LV function   Coronary angiogram 10/29/2015 Ost LM lesion, 80% stenosed. The left ventricular systolic function is normal.  TTE 11/16/2015 Study Conclusions   - Left ventricle: The cavity size was normal. Wall thickness was    increased (posterior wall) in a pattern of mild LVH. Systolic    function was normal. The estimated ejection fraction was in the    range of 60% to 65%. Wall motion was normal; there were no    regional wall motion abnormalities. Doppler parameters are    consistent with abnormal left ventricular relaxation (grade 1    diastolic dysfunction).  - Right ventricle: The cavity size was normal. Wall thickness was    normal.   Laboratory Data: High Sensitivity Troponin: 60-> 60 Chemistry Recent Labs  Lab 10/25/24 1135  NA 137  K 4.3  CL 99  CO2 28  GLUCOSE 169*  BUN 12  CREATININE 1.17*  CALCIUM  10.5*  GFRNONAA 54*  ANIONGAP 10   Lipids 08/23/24 Tchol 305  HDL 26 LDL 183 Triglycerides 468  Hgb A1c 08/23/24 9.2%  Hematology Recent Labs  Lab 10/25/24 1135  WBC 11.2*  RBC 4.41  HGB 13.0  HCT 39.4  MCV 89.3  MCH 29.5  MCHC 33.0  RDW 12.4  PLT 278   DDimer   Recent Labs  Lab 10/25/24 1737  DDIMER 0.88*   Radiology/Studies:  DG Chest 2 View Result Date: 10/25/2024 CLINICAL DATA:  Chest pain EXAM: CHEST - 2 VIEW COMPARISON:  Mar 09, 2023 FINDINGS: The heart size and mediastinal contours are within normal limits. Sternotomy wires are noted. Both lungs are clear. The visualized skeletal structures are unremarkable. IMPRESSION: No active cardiopulmonary disease. Electronically Signed   By: Lynwood Landy Raddle M.D.   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 Unfortunately, she has not been able to decrease her risk factors since her CABG 8 years ago, and therefore, my suspicion for worsening coronary artery disease is very high. She continues to smoke 1 ppd, most recent LDL was in the 180s, and hgb A1c 9%. She has been intolerant to different lipid lowering therapy (statin) and diabetes medications (mainly GLP1) and is wanting to start PCSK9i on the outpatient setting. She does not have any current chest pain and her troponin is flat at 60. I do not think we need to treat for ACS given her lack of current chest pain (or anginal equivalent) and lack of heart failure. However, I strongly recommend that she remains inpatient for a coronary angiogram (which will likely not happen until Monday if she remains stable) as she is high risk for worsening CAD with a concerning story. She does not have any current chest pain, ventricular arrhythmias, or signs of cardiogenic shock that would prompt urgent revascularization. However, if she were to develop  any of the above, then we would pursue urgent coronary angiogram. We discussed her risk factors for heart disease, and she has many modifiable risk factors which she is motivated to treat and improve. She understands that her smoking status, cholesterol, diabetes, and blood pressures are considerable risk factors for worsening disease. I would avoid P2Y12 inhibitor at this point as she is chest pain free and in case she has disease that warrants surgical consideration. - TTE in AM  - Coronary angiogram this admission - If patient develops chest pain, would recheck troponin, if increasing then would need to start heparin  alternative such as bivalirudin; can also start bival if chest pain does not subside with PO nitrates  - Avoid heparin  due to HIT history  - Continue aspirin  81 mg daily - Blood pressure control - Blood glucose control  - Needs PCSK9i as outpatient and aggressive lipid lowering therapy for goal LDL <55  Risk Assessment/Risk Scores:   For questions or updates, please contact [facility] Please consult [email or web address] for contact info under   Signed, Jerrell DELENA Orchard, MD  10/25/2024 8:24 PM      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   Iohexol  Itching, Rash and Other (See Comments)    Desc: White blisters in mouth during ivp in [place] '93, ok w/ 13 hour prep today//a.calhoun, Onset Date: 10/30/1991    Iodinated Contrast Media Itching and Rash   Penicillins Itching and Rash    Has patient had a PCN reaction causing immediate rash, facial/tongue/throat swelling, SOB or lightheadedness with hypotension: Yes Has patient had a PCN reaction causing severe rash involving mucus membranes or skin necrosis: No Has patient had a PCN reaction that  required hospitalization: No Has patient had a PCN reaction occurring within the last 10 years: No If all of the above answers are NO, then may proceed with Cephalosporin use.   Sulfa Antibiotics Itching, Rash and Other (See Comments)   "

## 2024-10-25 NOTE — Assessment & Plan Note (Signed)
 Blood sugars 170s on presentation  Cont home toujeo  at reduced dose  SSI  A1C  Monitor

## 2024-10-25 NOTE — Assessment & Plan Note (Signed)
 Baseline HLD w/ intolerance to statins  Has been in discussion w/ cardiology about repatha   Check lipid panel  Follow up cardiology recommendations

## 2024-10-25 NOTE — Progress Notes (Signed)
 Hospitalist Progress Note:  Patient admitted by Hospitalists from [facility] to [facility] for unstable angina. Virtual admission with full admission orders and admission H&P have been completed today (10/25/24) by Dr. Eldonna.   Per request of PP RN, I have eyeballed patient, who does not appear to be in any obvious acute distress. Pt denied any active chest pain at this to me. She complains of mild nausea. I have confirmed existing order for prn zofran . Otherwise, patient without acute complaint at this time.      Eva Pore, DO Hospitalist

## 2024-10-25 NOTE — ED Notes (Signed)
 Patient transported to X-ray

## 2024-10-25 NOTE — Telephone Encounter (Signed)
 Pharmacy Patient Advocate Encounter  Received notification from [facility] that Prior Authorization for Mounjaro  2.5 mg/0.5ml pen has been APPROVED from 10/24/24 to 10/23/25. Ran test claim, Copay is $25.00. This test claim was processed through [facility]- copay amounts may vary at other pharmacies due to pharmacy/plan contracts, or as the patient moves through the different stages of their insurance plan.   PA #/Case ID/Reference #: Approval letter has been uploaded to her media tab

## 2024-10-25 NOTE — Progress Notes (Signed)
" °   10/25/24 2025  BiPAP/CPAP/SIPAP  BiPAP/CPAP/SIPAP Pt Type Adult  Reason BIPAP/CPAP not in use Non-compliant (patient refuses CPAP for the night, states that she does not wear one at home. RN at bedside and is aware of refusal)     Antoine Fiallos L. Claudene, BS, RRT-ACCS, RCP "

## 2024-10-26 ENCOUNTER — Observation Stay (HOSPITAL_COMMUNITY)

## 2024-10-26 ENCOUNTER — Other Ambulatory Visit: Payer: Self-pay | Admitting: Physician Assistant

## 2024-10-26 ENCOUNTER — Other Ambulatory Visit (HOSPITAL_COMMUNITY): Payer: Self-pay

## 2024-10-26 DIAGNOSIS — R079 Chest pain, unspecified: Secondary | ICD-10-CM | POA: Diagnosis not present

## 2024-10-26 DIAGNOSIS — I200 Unstable angina: Secondary | ICD-10-CM

## 2024-10-26 DIAGNOSIS — I259 Chronic ischemic heart disease, unspecified: Secondary | ICD-10-CM

## 2024-10-26 LAB — ECHOCARDIOGRAM COMPLETE
AR max vel: 1.9 cm2
AV Area VTI: 1.98 cm2
AV Area mean vel: 1.76 cm2
AV Mean grad: 5 mmHg
AV Peak grad: 8.8 mmHg
Ao pk vel: 1.48 m/s
Area-P 1/2: 3.08 cm2
Height: 67 in
S' Lateral: 3.3 cm
Weight: 3149.93 [oz_av]

## 2024-10-26 LAB — GLUCOSE, CAPILLARY
Glucose-Capillary: 193 mg/dL — ABNORMAL HIGH (ref 70–99)
Glucose-Capillary: 172 mg/dL — ABNORMAL HIGH (ref 70–99)

## 2024-10-26 LAB — LIPID PANEL
Cholesterol: 260 mg/dL — ABNORMAL HIGH (ref 0–200)
HDL: 22 mg/dL — ABNORMAL LOW
LDL Cholesterol: 176 mg/dL — ABNORMAL HIGH (ref 0–99)
Total CHOL/HDL Ratio: 12 ratio
Triglycerides: 310 mg/dL — ABNORMAL HIGH
VLDL: 62 mg/dL — ABNORMAL HIGH (ref 0–40)

## 2024-10-26 MED ORDER — ASPIRIN 81 MG PO TBEC
81.0000 mg | DELAYED_RELEASE_TABLET | Freq: Every day | ORAL | 0 refills | Status: AC
  Filled 2024-10-26: qty 60, 60d supply, fill #0

## 2024-10-26 MED ORDER — NICOTINE 21 MG/24HR TD PT24
21.0000 mg | MEDICATED_PATCH | Freq: Every day | TRANSDERMAL | 0 refills | Status: DC
  Filled 2024-10-26: qty 28, 28d supply, fill #0

## 2024-10-26 MED ORDER — CARVEDILOL 12.5 MG PO TABS
12.5000 mg | ORAL_TABLET | Freq: Two times a day (BID) | ORAL | Status: DC
  Administered 2024-10-26: 12.5 mg via ORAL
  Filled 2024-10-26: qty 1

## 2024-10-26 MED ORDER — AMLODIPINE BESYLATE 10 MG PO TABS
10.0000 mg | ORAL_TABLET | Freq: Every day | ORAL | Status: DC
  Administered 2024-10-26: 10 mg via ORAL
  Filled 2024-10-26: qty 1

## 2024-10-26 MED ORDER — CARVEDILOL 12.5 MG PO TABS
12.5000 mg | ORAL_TABLET | Freq: Two times a day (BID) | ORAL | 0 refills | Status: DC
  Filled 2024-10-26: qty 120, 60d supply, fill #0

## 2024-10-26 MED ORDER — PERFLUTREN LIPID MICROSPHERE
1.0000 mL | INTRAVENOUS | Status: AC | PRN
  Administered 2024-10-26: 7 mL via INTRAVENOUS

## 2024-10-26 NOTE — Discharge Summary (Signed)
 Physician Discharge Summary  CHEILA WICKSTROM FMW:[id number] DOB: [birth year] DOA: 10/25/2024  PCP: [name], [name]  Admit date: 10/25/2024 Discharge date: 10/26/2024  Admitted From: Home Disposition:  Home  Discharge Condition:Stable CODE STATUS:FULL, DNR, Comfort Care Diet recommendation: Heart Healthy / Carb Modified / Regular / Dysphagia   Brief/Interim Summary: Patient is a 59 year old female with history of COPD, obesity, tobacco use, coronary disease status post CABG in 2017, hypertension, DVT/PEs, HIT, breast cancer who presented with complaint of chest pain.  Moderate central chest pain with radiation up to the neck and down to the left arm.  Recent stress testing on April 2024 was normal.  Smokes a pack a day.  On presentation, she was hypertensive.  Initial troponin was in the range of 60s.  EKG showed normal sinus rhythm.  Chest x-ray did not show any acute findings.  Patient admitted for for suspicion of unstable angina.  Cardiology consulted.  Troponins remained stable.  Her chest pain resolved this morning.  Echo showed normal EF, no wall motion abnormality.  Cardiology recommended outpatient follow-up  for PET Stress test.  Medically stable for discharge home today.    Following problems were addressed during the hospitalization:  Unstable angina: History of coronary artery disease, status post CABG.  Recent stress test was negative in 2024.  Elevated troponin on flat range.  Workup as above.  Continue aspirin  on discharge   CKD stage IIIa: Currently kidney function at baseline.   Hypertensive urgency: Elevated blood pressure on presentation.  Resumed home Norvasc .  Added Coreg    History of OSA: On CPAP   Type 2 diabetes: Takes Toujeo  at home.    A1c of 9.4   Tobacco abuse: Smokes a pack a day.  Counseled for cessation.  Continue nicotine  patch   Hyperlipidemia: Does not tolerate statin.  She is discussing with cardiology about Repatha .  New LDL of 176.  Cardiology  also considering starting ezitimib may be as outpatient  COPD: Currently not on exacerbation.  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  Not on anticoagulation at the moment.   GERD: Continue PPI   Discharge Diagnoses:  Principal Problem:   Unstable angina (HCC) Active Problems:   GERD (gastroesophageal reflux disease)   Hx of pulmonary embolus   COPD (chronic obstructive pulmonary disease) (HCC)   Hyperlipidemia associated with type 2 diabetes mellitus (HCC)   Tobacco abuse   Type 2 diabetes mellitus (HCC)   Obstructive sleep apnea   Hypertensive urgency   Chronic kidney disease (CKD), stage 3 [facility])    Discharge Instructions  Discharge Instructions     Discharge instructions   Complete by: As directed    1)Please take your medications as instructed 2)Follow up with your PCP in a week 3)You will be called by cardiology for outpatient appointment.  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   Iohexol  Itching, Rash, Other (See Comments)   Desc: White blisters in mouth during ivp in [place] '93, ok w/ 13 hour prep today//a.calhoun, Onset Date: 10/30/1991   Iodinated Contrast  Media Itching, Rash   Penicillins Itching, Rash   Has patient had a PCN reaction causing immediate rash, facial/tongue/throat swelling, SOB or lightheadedness with hypotension: Yes Has patient had a PCN reaction causing severe rash involving mucus membranes or skin necrosis: No Has patient had a PCN reaction that required hospitalization: No Has patient had a PCN reaction occurring within the last 10 years: No If all of the above  answers are NO, then may proceed with Cephalosporin use.   Sulfa Antibiotics Itching, Rash, Other (See Comments)        Medication List     STOP taking these medications    azithromycin  250 MG tablet Commonly known as: Zithromax  Z-Pak   metoprolol  tartrate 50 MG tablet Commonly known as: LOPRESSOR    rosuvastatin  10 MG tablet Commonly known as: Crestor        TAKE these medications    acetaminophen  500 MG tablet Commonly known as: TYLENOL  Take 1,000 mg by mouth every 6 (six) hours as needed.   albuterol  108 (90 Base) MCG/ACT inhaler Commonly known as: VENTOLIN  HFA Inhale 2 puffs into the lungs every 4 (four) hours as needed for wheezing or shortness of breath.   amLODipine  10 MG tablet Commonly known as: NORVASC  Take 1 tablet (10 mg total) by mouth daily.   aspirin  EC 81 MG tablet Take 1 tablet (81 mg total) by mouth daily. Swallow whole. Start taking on: October 27, 2024   blood glucose meter kit and supplies Dispense based on patient and insurance preference. Use up to four times daily as directed. (FOR ICD-10 E10.9, E11.9).   CALCIUM  600 + D PO Take 1 tablet by mouth daily.   carvedilol  12.5 MG tablet Commonly known as: COREG  Take 1 tablet (12.5 mg total) by mouth 2 (two) times daily with a meal.   cetirizine  10 MG tablet Commonly known as: ZYRTEC  TAKE ONE (1) TABLET BY MOUTH EVERY DAY   cholecalciferol 25 MCG (1000 UNIT) tablet Commonly known as: VITAMIN D3 Take 1,000 Units by mouth daily.   clobetasol  cream 0.05 % Commonly known as: TEMOVATE  Apply 1 Application topically 2 (two) times daily.   cyclobenzaprine  10 MG tablet Commonly known as: FLEXERIL  TAKE ONE TABLET BY MOUTH THREE TIMES DAILY AS NEEDED FOR MUSCLE SPASM   Dexcom G7 Sensor Misc Change every 10 days   diphenhydrAMINE  50 MG tablet Commonly known as: BENADRYL  Take 1 tablet (50 mg total) by mouth once for 1 dose. Take on date/time instructed by prescribing provider.  Take within 1  hour of contrast injection. Call [phone] for questions.   doxycycline  100 MG tablet Commonly known as: VIBRA -TABS Take 1 tablet (100 mg total) by mouth 2 (two) times daily. 1 po bid   esomeprazole  40 MG capsule Commonly known as: NEXIUM  Take 1 capsule (40 mg total) by mouth daily.   famotidine  40 MG tablet Commonly known as: Pepcid  Take 1 tablet (40 mg total) by mouth daily.   FLUoxetine  20 MG capsule Commonly known as: PROZAC  Take 1 capsule (20 mg total) by mouth daily.   fluticasone  50 MCG/ACT nasal spray Commonly known as: FLONASE  Place 2 sprays into both nostrils daily.   ibuprofen  200 MG tablet Commonly known as: ADVIL  Take 400 mg by mouth every 6 (six) hours as needed for moderate pain.   insulin  glargine (2 Unit Dial ) 300 UNIT/ML Solostar Pen Commonly known as: TOUJEO  MAX Inject 20 Units into the skin daily. What changed: how much to take   ipratropium-albuterol  0.5-2.5 (3) MG/3ML Soln  Commonly known as: DUONEB Take 3 mLs by nebulization every 6 (six) hours as needed (Foir shortness of breath).   LORazepam  0.5 MG tablet Commonly known as: ATIVAN  Take 1 tablet (0.5 mg total) by mouth at bedtime.   magnesium  oxide 400 MG tablet Commonly known as: MAG-OX Take 2 tablets (800 mg total) by mouth 2 (two) times daily.   nicotine  21 mg/24hr patch Commonly known as: NICODERM CQ  - dosed in mg/24 hours Place 1 patch (21 mg total) onto the skin daily. Start taking on: October 27, 2024   nitroGLYCERIN  0.4 MG SL tablet Commonly known as: NITROSTAT  Place 1 tablet (0.4 mg total) under the tongue every 5 (five) minutes as needed for chest pain.   ondansetron  8 MG disintegrating tablet Commonly known as: ZOFRAN -ODT TAKE 1 TABLET BY MOUTH EVERY 6 HOURS AS NEEDED FOR NAUSEA & VOMITING   Pen Needles 32G X 4 MM Misc Use to administer insulin  daily. DX: E11.65   tirzepatide  2.5 MG/0.5ML Pen Commonly known as: MOUNJARO  Inject 2.5 mg into the skin once a week. Dx  E11.65   Vitamin D  (Ergocalciferol ) 1.25 MG (50000 UNIT) Caps capsule Commonly known as: DRISDOL  Take 1 capsule (50,000 Units total) by mouth every 7 (seven) days.        Follow-up Information     [name], [name]. Schedule an appointment as soon as possible for a visit in 1 week(s).   Specialty: Family Medicine Contact information: [address] [place] [address] [phone]                Allergies[1]  Consultations: Cardiology   Procedures/Studies: ECHOCARDIOGRAM COMPLETE Result Date: 10/26/2024    ECHOCARDIOGRAM REPORT   Patient Name:   Brenda Cruz Date of Exam: 10/26/2024 Medical Rec #:  [id number]       Height:       67.0 in Accession #:    [id number]      Weight:       196.9 lb Date of Birth:  02-12-1966      BSA:          2.009 m Patient Age:    58 years        BP:           155/75 mmHg Patient Gender: F               HR:           63 bpm. Exam Location:  Inpatient Procedure: 2D Echo, Color Doppler, Cardiac Doppler and Intracardiac            Opacification Agent (Both Spectral and Color Flow Doppler were            utilized during procedure). Indications:    Chest Pain  History:        Patient has prior history of Echocardiogram examinations, most                 recent 05/16/2017. CAD, COPD; Risk Factors:Dyslipidemia and                 Diabetes.  Sonographer:    Sherlean Dubin Referring Phys: STEVEN J NEWTON IMPRESSIONS  1. Left ventricular ejection fraction, by estimation, is 60 to 65%. The left ventricle has normal function. The left ventricle has no regional wall motion abnormalities. There is mild concentric left ventricular hypertrophy. Left ventricular diastolic parameters were normal.  2. Right ventricular systolic function is mildly reduced. The right ventricular size is normal.  3. The mitral valve is normal in structure. No evidence of mitral valve regurgitation. No evidence of mitral stenosis.  4. The aortic valve is normal in structure.  Aortic valve regurgitation is not visualized. No aortic stenosis is present.  5. The inferior vena cava is normal in size with greater than 50% respiratory variability, suggesting right atrial pressure of 3 mmHg. Comparison(s): No significant change from prior study. FINDINGS  Left Ventricle: Left ventricular ejection fraction, by estimation, is 60 to 65%. The left ventricle has normal function. The left ventricle has no regional wall motion abnormalities. Definity  contrast agent was given IV to delineate the left ventricular  endocardial borders. The left ventricular internal cavity size was normal in size. There is mild concentric left ventricular hypertrophy. Left ventricular diastolic parameters were normal. Right Ventricle: The right ventricular size is normal. No increase in right ventricular wall thickness. Right ventricular systolic function is mildly reduced. Left Atrium: Left atrial size was normal in size. Right Atrium: Right atrial size was normal in size. Pericardium: There is no evidence of pericardial effusion. Mitral Valve: The mitral valve is normal in structure. No evidence of mitral valve regurgitation. No evidence of mitral valve stenosis. Tricuspid Valve: The tricuspid valve is normal in structure. Tricuspid valve regurgitation is not demonstrated. No evidence of tricuspid stenosis. Aortic Valve: The aortic valve is normal in structure. Aortic valve regurgitation is not visualized. No aortic stenosis is present. Aortic valve mean gradient measures 5.0 mmHg. Aortic valve peak gradient measures 8.8 mmHg. Aortic valve area, by VTI measures 1.98 cm. Pulmonic Valve: The pulmonic valve was not well visualized. Pulmonic valve regurgitation is not visualized. No evidence of pulmonic stenosis. Aorta: The aortic root is normal in size and structure. Venous: The inferior vena cava is normal in size with greater than 50% respiratory variability, suggesting right atrial pressure of 3 mmHg. IAS/Shunts: The  interatrial septum was not well visualized.  LEFT VENTRICLE PLAX 2D LVIDd:         5.10 cm   Diastology LVIDs:         3.30 cm   LV e' medial:    7.62 cm/s LV PW:         1.00 cm   LV E/e' medial:  10.8 LV IVS:        1.10 cm   LV e' lateral:   8.16 cm/s LVOT diam:     1.80 cm   LV E/e' lateral: 10.1 LV SV:         62 LV SV Index:   31 LVOT Area:     2.54 cm  RIGHT VENTRICLE            IVC RV Basal diam:  3.80 cm    IVC diam: 1.80 cm RV Mid diam:    3.40 cm RV S prime:     7.97 cm/s TAPSE (M-mode): 1.1 cm LEFT ATRIUM             Index        RIGHT ATRIUM           Index LA diam:        4.10 cm 2.04 cm/m   RA Area:     17.60 cm LA Vol (A2C):   47.5 ml 23.65 ml/m  RA Volume:   51.70 ml  25.74 ml/m LA Vol (A4C):   33.8 ml 16.83 ml/m LA Biplane Vol: 41.3 ml 20.56 ml/m  AORTIC VALVE AV Area (Vmax):    1.90 cm AV  Area (Vmean):   1.76 cm AV Area (VTI):     1.98 cm AV Vmax:           148.00 cm/s AV Vmean:          104.000 cm/s AV VTI:            0.311 m AV Peak Grad:      8.8 mmHg AV Mean Grad:      5.0 mmHg LVOT Vmax:         110.50 cm/s LVOT Vmean:        71.800 cm/s LVOT VTI:          0.242 m LVOT/AV VTI ratio: 0.78  AORTA Ao Root diam: 2.40 cm Ao Asc diam:  2.90 cm MITRAL VALVE               TRICUSPID VALVE MV Area (PHT): 3.08 cm    TR Peak grad:   8.5 mmHg MV Decel Time: 246 msec    TR Vmax:        146.00 cm/s MV E velocity: 82.30 cm/s MV A velocity: 76.50 cm/s  SHUNTS MV E/A ratio:  1.08        Systemic VTI:  0.24 m                            Systemic Diam: 1.80 cm Joelle Cedars Tonleu Electronically signed by Joelle Cedars Ny Signature Date/Time: 10/26/2024/11:30:42 AM    Final    DG Chest 2 View Result Date: 10/25/2024 CLINICAL DATA:  Chest pain EXAM: CHEST - 2 VIEW COMPARISON:  Mar 09, 2023 FINDINGS: The heart size and mediastinal contours are within normal limits. Sternotomy wires are noted. Both lungs are clear. The visualized skeletal structures are unremarkable. IMPRESSION: No active  cardiopulmonary disease. Electronically Signed   By: Lynwood Landy Raddle M.D.   On: 10/25/2024 12:38      Subjective: Patient seen and examined at bedside today.  Hemodynamically stable.  Chest pain-free.  Very comfortable.  Really wants to go home today.  Medically stable for discharge.  Discharge Exam: Vitals:   10/25/24 2300 10/26/24 0356  BP: (!) 146/68 (!) 155/75  Pulse: 70 73  Resp: 19 19  Temp: (!) 97.5 F (36.4 C) (!) 97.4 F (36.3 C)  SpO2:     Vitals:   10/25/24 1924 10/25/24 2120 10/25/24 2300 10/26/24 0356  BP: (!) 191/91 (!) 179/71 (!) 146/68 (!) 155/75  Pulse:  99 70 73  Resp:   19 19  Temp:   (!) 97.5 F (36.4 C) (!) 97.4 F (36.3 C)  TempSrc:   Oral Oral  SpO2:      Weight:      Height:        General: Pt is alert, awake, not in acute distress Cardiovascular: RRR, S1/S2 +, no rubs, no gallops Respiratory: CTA bilaterally, no wheezing, no rhonchi Abdominal: Soft, NT, ND, bowel sounds + Extremities: no edema, no cyanosis    The results of significant diagnostics from this hospitalization (including imaging, microbiology, ancillary and laboratory) are listed below for reference.     Microbiology: No results found for this or any previous visit (from the past 240 hours).   Labs: BNP (last 3 results) No results for input(s): BNP in the last 8760 hours. Basic Metabolic Panel: Recent Labs  Lab 10/25/24 1135  NA 137  K 4.3  CL 99  CO2 28  GLUCOSE 169*  BUN  12  CREATININE 1.17*  CALCIUM  10.5*   Liver Function Tests: No results for input(s): AST, ALT, ALKPHOS, BILITOT, PROT, ALBUMIN  in the last 168 hours. No results for input(s): LIPASE, AMYLASE in the last 168 hours. No results for input(s): AMMONIA in the last 168 hours. CBC: Recent Labs  Lab 10/25/24 1135  WBC 11.2*  HGB 13.0  HCT 39.4  MCV 89.3  PLT 278   Cardiac Enzymes: No results for input(s): CKTOTAL, CKMB, CKMBINDEX, TROPONINI in the last 168  hours. BNP: Invalid input(s): POCBNP CBG: Recent Labs  Lab 10/25/24 1705 10/25/24 2134 10/26/24 0639 10/26/24 1116  GLUCAP 232* 237* 193* 172*   D-Dimer Recent Labs    10/25/24 1737  DDIMER 0.88*   Hgb A1c Recent Labs    10/25/24 1737  HGBA1C 9.4*   Lipid Profile Recent Labs    10/26/24 0211  CHOL 260*  HDL 22*  LDLCALC 176*  TRIG 310*  CHOLHDL 12.0   Thyroid  function studies No results for input(s): TSH, T4TOTAL, T3FREE, THYROIDAB in the last 72 hours.  Invalid input(s): FREET3 Anemia work up No results for input(s): VITAMINB12, FOLATE, FERRITIN, TIBC, IRON, RETICCTPCT in the last 72 hours. Urinalysis    Component Value Date/Time   COLORURINE YELLOW 11/27/2022 1605   APPEARANCEUR Clear 07/25/2024 1537   LABSPEC 1.011 11/27/2022 1605   PHURINE 5.0 11/27/2022 1605   GLUCOSEU Trace (A) 07/25/2024 1537   HGBUR NEGATIVE 11/27/2022 1605   BILIRUBINUR Negative 07/25/2024 1537   KETONESUR NEGATIVE 11/27/2022 1605   PROTEINUR Negative 07/25/2024 1537   PROTEINUR 30 (A) 11/27/2022 1605   UROBILINOGEN negative 06/23/2015 0905   UROBILINOGEN 0.2 06/25/2007 2023   NITRITE Negative 07/25/2024 1537   NITRITE NEGATIVE 11/27/2022 1605   LEUKOCYTESUR Negative 07/25/2024 1537   LEUKOCYTESUR SMALL (A) 11/27/2022 1605   Sepsis Labs Recent Labs  Lab 10/25/24 1135  WBC 11.2*   Microbiology No results found for this or any previous visit (from the past 240 hours).  Please note: You were cared for by a hospitalist during your hospital stay. Once you are discharged, your primary care physician will handle any further medical issues. Please note that NO REFILLS for any discharge medications will be authorized once you are discharged, as it is imperative that you return to your primary care physician (or establish a relationship with a primary care physician if you do not have one) for your post hospital discharge needs so that they can reassess your  need for medications and monitor your lab values.    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   Iohexol  Itching, Rash and Other (See Comments)    Desc: White blisters in mouth during ivp in [place] '93, ok w/ 13 hour prep today//a.calhoun, Onset Date: 10/30/1991    Iodinated Contrast Media Itching and Rash   Penicillins Itching and Rash    Has patient had a PCN reaction causing immediate rash, facial/tongue/throat swelling, SOB or lightheadedness with hypotension: Yes Has patient had a PCN reaction causing severe rash involving mucus membranes or skin necrosis: No Has  patient had a PCN reaction that required hospitalization: No Has patient had a PCN reaction occurring within the last 10 years: No If all of the above answers are NO, then may proceed with Cephalosporin use.   Sulfa Antibiotics Itching, Rash and Other (See Comments)

## 2024-10-26 NOTE — Progress Notes (Signed)
 Walked with patient to [facility] pharmacy to pick up discharge medications.  Patient had copy of AVS and had no quesiton about discharge info or medications.  Ride to pick up at ED entrance  walked with patient to ED and talked to husband who is ride to inform we were at [facility] ED entrance.  Husband arrived

## 2024-10-26 NOTE — Progress Notes (Signed)
 Ordered OP PET stress

## 2024-10-26 NOTE — Progress Notes (Signed)
"  °  Progress Note  Patient Name: Brenda Cruz Date of Encounter: 10/26/2024 [facility] Cardiologist: [name], MD   Interval Summary   Patient denies any CP or dyspnea. Reports nonadherence to her BP meds.   Vital Signs Vitals:   10/25/24 1924 10/25/24 2120 10/25/24 2300 10/26/24 0356  BP: (!) 191/91 (!) 179/71 (!) 146/68 (!) 155/75  Pulse:  99 70 73  Resp:   19 19  Temp:   (!) 97.5 F (36.4 C) (!) 97.4 F (36.3 C)  TempSrc:   Oral Oral  SpO2:      Weight:      Height:        Intake/Output Summary (Last 24 hours) at 10/26/2024 1003 Last data filed at 10/25/2024 2200 Gross per 24 hour  Intake 300 ml  Output --  Net 300 ml      10/25/2024    7:09 PM 09/06/2024    9:56 AM 08/23/2024    4:17 PM  Last 3 Weights  Weight (lbs) 196 lb 13.9 oz 200 lb 200 lb  Weight (kg) 89.3 kg 90.719 kg 90.719 kg      Telemetry/ECG  NSR - Personally Reviewed  Physical Exam  GEN: No acute distress.   Neck: No JVD Cardiac: RRR, no murmurs, rubs, or gallops.  Respiratory: Clear to auscultation bilaterally. GI: Soft, nontender, non-distended  MS: No edema  Assessment & Plan  Brenda Cruz is a 59 year old female with history of CAD status post CABG with LIMA to LAD in 2017, uncontrolled hypertension in setting of medication nonadherence, uncontrolled diabetes, tobacco use with 38-pack-year who presented with chest pain in the setting of systolic blood pressure around 220 and mild troponin elevation.  Patient started on heparin  drip due to history of HIT.  #CP c/f type 1 vs. Type 2 NSTEMI #CAD s/p CABG x 1 #Uncontrolled HTN #Uncontrolled DM #Tobacco use - Presented with 1 episode of chest pain that lasted 15 minutes in setting of systolic blood pressure around 220 due to medication none adherence.  Denies any angina or dyspnea with exertion at baseline.  Given minimal troponin elevation, no worsening angina at baseline and nuclear stress test back in February 2024 that was low  risk, I suspect this is likely demand.  Will obtain an echocardiogram and if unremarkable, would favor PET stress test rather than going straight to angiogram.  I am also concerned about proceeding to angiogram given nonadherence to medication for her blood pressure and cholesterol.  I am not confident that she would take her DAPT as prescribed. - Will send for lipoprotein a level. - Switched her metoprolol  to carvedilol  12.5 mg twice daily. - Resumed amlodipine  10 mg daily.  I had a discussion about the patient regarding her nonadherence and emphasized that she needs to take her blood pressure medications as prescribed. - Patient has not been on statin due to myalgia.  We will start ezetimibe 10 mg daily outpatient, but given LDL of 176, she would likely need a PCSK9i. -We will continue to follow up  For questions or updates, please contact [facility] Please consult [email or web address] for contact info under   Signed, Joelle VEAR Ren Donley, MD  "

## 2024-10-28 ENCOUNTER — Telehealth: Payer: Self-pay

## 2024-10-28 ENCOUNTER — Encounter: Payer: Self-pay | Admitting: Cardiovascular Disease

## 2024-10-28 NOTE — Transitions of Care (Post Inpatient/ED Visit) (Signed)
 "  10/28/2024  Name: Brenda Cruz MRN: [id number] DOB: [birth year]  Today's TOC FU Call Status: Today's TOC FU Call Status:: Successful TOC FU Call Completed  Patient's Name and Date of Birth confirmed. Name, DOB  Transition Care Management Follow-up Telephone Call Date of Discharge: 10/26/24 Discharge Facility: [name]) Type of Discharge: Inpatient Admission Primary Inpatient Discharge Diagnosis:: Unstable Angina How have you been since you were released from the hospital?: Same Any questions or concerns?: Yes Patient Questions/Concerns:: (S) Patient states that she received conflicting information from the providers that saw her while she was admitted.  She is wondering if all diagnostic testing that could be done was exhausted. She is also having problems with change in medication to Carvedilol  and has since went back to taking Metoprolol  instead.  Questions on if she should stay out of work Patient Questions/Concerns Addressed: Notified Provider of Patient Questions/Concerns  Items Reviewed: Did you receive and understand the discharge instructions provided?: Yes Medications obtained,verified, and reconciled?: Yes (Medications Reviewed) Any new allergies since your discharge?: (S) Yes (unsure if Carvedilol  is now an allergy) Dietary orders reviewed?: NA Do you have support at home?: Yes  Medications Reviewed Today: Medications Reviewed Today     Reviewed by Lavelle Charmaine NOVAK, LPN (Licensed Practical Nurse) on 10/28/24 at 1319  Med List Status: <None>   Medication Order Taking? Sig Documenting Provider Last Dose Status Informant  acetaminophen  (TYLENOL ) 500 MG tablet [id number]  Take 1,000 mg by mouth every 6 (six) hours as needed. [provider]  Active Self  albuterol  (VENTOLIN  HFA) 108 (90 Base) MCG/ACT inhaler [id number]  Inhale 2 puffs into the lungs every 4 (four) hours as needed for wheezing or shortness of breath. Joshua Clayborne RAMAN, PA-C  Active Self   amLODipine  (NORVASC ) 10 MG tablet [id number]  Take 1 tablet (10 mg total) by mouth daily. [name], [name], [name]  Active Self  aspirin  EC 81 MG tablet [id number]  Take 1 tablet (81 mg total) by mouth daily. Swallow whole. Jillian Buttery, MD  Active   blood glucose meter kit and supplies [id number]  Dispense based on patient and insurance preference. Use up to four times daily as directed. (FOR ICD-10 E10.9, E11.9). [name], [name], [name]  Active Self  Calcium  Carb-Cholecalciferol (CALCIUM  600 + D PO) [id number]  Take 1 tablet by mouth daily. [provider]  Active Self  carvedilol  (COREG ) 12.5 MG tablet [id number]  Take 1 tablet (12.5 mg total) by mouth 2 (two) times daily with a meal.  Patient not taking: Reported on 10/28/2024   Jillian Buttery, MD  Active   cetirizine  (ZYRTEC ) 10 MG tablet [id number]  TAKE ONE (1) TABLET BY MOUTH EVERY DAY [name], [name], [name]  Active Self  cholecalciferol (VITAMIN D3) 25 MCG (1000 UNIT) tablet [id number]  Take 1,000 Units by mouth daily. [provider]  Active Self  clobetasol  cream (TEMOVATE ) 0.05 % [id number]  Apply 1 Application topically 2 (two) times daily.  Patient not taking: Reported on 10/25/2024   Deitra Morton Sebastian Nena, NP  Active Self  Continuous Glucose Sensor (DEXCOM G7 SENSOR) MISC [id number]  Change every 10 days [name], [name]  Active Self  cyclobenzaprine  (FLEXERIL ) 10 MG tablet [id number]  TAKE ONE TABLET BY MOUTH THREE TIMES DAILY AS NEEDED FOR MUSCLE SPASM [name], [name], [name]  Active Self  diphenhydrAMINE  (BENADRYL ) 50 MG tablet [id number]  Take 1 tablet (50 mg total) by mouth once for 1 dose. Take on date/time instructed by prescribing provider.  Take within  1 hour of contrast injection. Call [phone] for questions. Pappayliou, Dorothyann LABOR, DO  Active Self  doxycycline  (VIBRA -TABS) 100 MG tablet [id number]  Take 1 tablet (100 mg total) by mouth 2 (two) times daily. 1 po bid [name], [name]  Active Self  esomeprazole  (NEXIUM ) 40 MG capsule [id number]  Take 1 capsule (40 mg total) by mouth daily. [name], [name], [name]  Active Self  famotidine  (PEPCID ) 40 MG tablet [id number]  Take 1 tablet (40 mg total) by mouth daily. [name], [name], [name]  Active Self  FLUoxetine  (PROZAC ) 20 MG capsule [id number]  Take 1 capsule (20 mg total) by mouth daily. [name], [name], [name]  Active Self  fluticasone  (FLONASE ) 50 MCG/ACT nasal spray [id number]  Place 2 sprays into both nostrils daily. Ijaola, Onyeje M, NP  Active Self  ibuprofen  (ADVIL ) 200 MG tablet [id number]  Take 400 mg by mouth every 6 (six) hours as needed for moderate pain. [provider]  Active Self  insulin  glargine, 2 Unit Dial , (TOUJEO  MAX) 300 UNIT/ML Solostar Pen [id number]  Inject 20 Units into the skin daily.  Patient taking differently: Inject 22 Units into the skin daily.   [name], [name], [name]  Active Self  Insulin  Pen Needle (PEN NEEDLES) 32G X 4 MM MISC [id number]  Use to administer insulin  daily. DX: E11.65 [name], [name]  Active Self  ipratropium-albuterol  (DUONEB) 0.5-2.5 (3) MG/3ML SOLN [id number]  Take 3 mLs by nebulization every 6 (six) hours as needed (Foir shortness of breath). [name], [name], [name]  Active Self  LORazepam  (ATIVAN ) 0.5 MG tablet [id number]  Take 1 tablet (0.5 mg total) by mouth at bedtime. [name], [name], [name]  Active Self  magnesium  oxide (MAG-OX) 400 MG tablet [id number]  Take 2 tablets (800 mg total) by mouth 2 (two) times daily. [name], [name], [name]  Active Self  nicotine  (NICODERM CQ  - DOSED IN MG/24 HOURS) 21 mg/24hr patch [id number]  Place 1 patch (21 mg total) onto the skin daily. Jillian Buttery, MD  Active   nitroGLYCERIN  (NITROSTAT ) 0.4 MG SL tablet [id number]  Place 1 tablet (0.4 mg total) under the tongue every 5 (five) minutes as needed for chest pain. Alaila, Pillard, PA  Expired 09/06/24 2359 Self           Med Note  LEONARDO, KIMBERLY L   Fri Oct 25, 2024  3:09 PM) Has but not taking  ondansetron  (ZOFRAN -ODT) 8 MG disintegrating tablet [id number]  TAKE 1 TABLET BY MOUTH EVERY 6 HOURS AS NEEDED FOR NAUSEA & VOMITING [name], [name], [name]  Active Self  tirzepatide  (MOUNJARO ) 2.5 MG/0.5ML Pen [id number]  Inject 2.5 mg into the skin once a week. Dx E11.65 [name], [name]  Active Self  Vitamin D , Ergocalciferol , (DRISDOL ) 1.25 MG (50000 UNIT) CAPS capsule [id number]  Take 1 capsule (50,000 Units total) by mouth every 7 (seven) days.  Patient not taking: Reported on 10/25/2024   Zollie Lowers, MD  Active Self            Home Care and Equipment/Supplies: Were Home Health Services Ordered?: NA Any new equipment or medical supplies ordered?: NA  Functional Questionnaire: Do you need assistance with bathing/showering or dressing?: No Do you need assistance with meal preparation?: No Do you need assistance with eating?: No Do you have difficulty maintaining continence: No Do you need assistance with getting out of bed/getting out of a chair/moving?: No Do you have difficulty managing or taking your medications?: No  Follow up appointments reviewed: PCP Follow-up appointment confirmed?: Yes Date  of PCP follow-up appointment?: 10/29/24 Follow-up Provider: Ronal Rollene Lunger; [facility] Follow-up appointment confirmed?: Yes Date of Specialist follow-up appointment?: 11/11/24 Follow-Up Specialty Provider:: Cardiology-Dr. [name] Do you need transportation to your follow-up appointment?: No Do you understand care options if your condition(s) worsen?: Yes-patient verbalized understanding    SIGNATURE Charmaine Bloodgood, LPN [facility] Advisor [place] [facility] You Are. We Are. One [facility] Direct Dial  [phone]  "

## 2024-10-29 ENCOUNTER — Encounter: Payer: Self-pay | Admitting: Nurse Practitioner

## 2024-10-29 ENCOUNTER — Ambulatory Visit: Admitting: Nurse Practitioner

## 2024-10-29 ENCOUNTER — Inpatient Hospital Stay: Admitting: Family Medicine

## 2024-10-29 VITALS — BP 150/76 | HR 77 | Temp 97.0°F | Ht 67.0 in | Wt 198.0 lb

## 2024-10-29 DIAGNOSIS — Z789 Other specified health status: Secondary | ICD-10-CM

## 2024-10-29 DIAGNOSIS — I25110 Atherosclerotic heart disease of native coronary artery with unstable angina pectoris: Secondary | ICD-10-CM

## 2024-10-29 DIAGNOSIS — E1165 Type 2 diabetes mellitus with hyperglycemia: Secondary | ICD-10-CM

## 2024-10-29 MED ORDER — METOPROLOL TARTRATE 50 MG PO TABS: 50.0000 mg | ORAL_TABLET | Freq: Two times a day (BID) | ORAL | 0 refills | Status: AC

## 2024-10-29 MED ORDER — TIRZEPATIDE 2.5 MG/0.5ML ~~LOC~~ SOAJ: 2.5000 mg | SUBCUTANEOUS | 0 refills | Status: DC

## 2024-10-29 NOTE — Progress Notes (Signed)
 "  Subjective:    Patient ID: Brenda Cruz, female    DOB: 02-Apr-1966, 59 y.o.   MRN: [id number]   Chief Complaint: hospital follow up  HPI  Today's visit was for Transitional Care Management.  The patient was discharged from [facility] on 10/26/24 with a primary diagnosis of unstable angina.   Contact with the patient and/or caregiver, by a clinical staff member, was made on 10/28/24 and was documented as a telephone encounter within the EMR.  Through chart review and discussion with the patient I have determined that management of their condition is of high complexity.   Patient called office wanting to be seen on 10/25/24 with elevated blood pressure and chest pain. Was advised to go to the ED.   history of COPD, obesity, tobacco use, coronary disease status post CABG in 2017, hypertension, DVT/PEs, HIT, breast cancer who presented with complaint of chest pain.  Moderate central chest pain with radiation up to the neck and down to the left arm.  Recent stress testing on April 2024 was normal.  Smokes a pack a day.  On presentation, she was hypertensive.  Initial troponin was in the range of 60s.  EKG showed normal sinus rhythm.  Chest x-ray did not show any acute findings.  Patient admitted for for suspicion of unstable angina.  Cardiology consulted.  Troponins remained stable.  Her chest pain resolved this morning.  Echo showed normal EF, no wall motion abnormality.  Cardiology recommended outpatient follow-up  for PET Stress test.  She will be contacted by cardiology to schedule stress test.  Since going home she has been some better. Chest pain has resolved They put her on carvedilol  which caused facial swelling so she stopped taking it and put herself back on her metoprolol . Patient is very anxious with her personal history of heart attack and stent placement. Does not have cardiology appointment until late next week. She was told by one cardiologist in hospital that she need to stay in  hospital and have a cath. Then next another cardiologist came in and sent her home. Patient Active Problem List   Diagnosis Date Noted   Unstable angina (HCC) 10/25/2024   Hypertensive urgency 10/25/2024   Chronic kidney disease (CKD), stage 3 (HCC) 10/25/2024   Dermatitis 08/14/2023   History of lumbar fusion 08/11/2023   Hair loss 08/03/2023   Obstructive sleep apnea 06/21/2023   Ventral hernia without obstruction or gangrene 01/11/2023   Inflammatory disorder of digestive system 01/17/2022   Screen for colon cancer 11/24/2020   History of cancer of left breast 11/24/2020   History of external beam radiation therapy 11/24/2020   History of lumpectomy of left breast 11/24/2020   Tobacco abuse 07/02/2019   Cancer related pain 03/01/2018   Skin yeast infection 02/07/2018   Hypomagnesemia 01/29/2018   Hypokalemia 01/23/2018   Candida UTI 01/05/2018   Anxiety in cancer patient 12/27/2017   History of infiltrating ductal carcinoma of breast 12/15/2017   Type 2 diabetes mellitus (HCC) 12/15/2017   Family history of breast cancer in first degree relative 12/15/2017   Obesity with body mass index of 30.0-39.9 12/04/2017   Hyperlipidemia associated with type 2 diabetes mellitus (HCC) 03/15/2017   Stress incontinence of urine 12/07/2016   COPD (chronic obstructive pulmonary disease) (HCC) 12/07/2016   Hx of pulmonary embolus 12/07/2015   Iron deficiency anemia 11/23/2015   S/P CABG x 1 11/02/2015   Coronary artery disease involving native coronary artery of native heart with angina  pectoris    Essential hypertension 10/16/2015   GERD (gastroesophageal reflux disease) 05/16/2013   Depression 05/16/2013   GAD (generalized anxiety disorder) 05/16/2013        Review of Systems  Respiratory:  Negative for shortness of breath.   Cardiovascular:  Positive for chest pain.  Psychiatric/Behavioral:  The patient is nervous/anxious.        Objective:   Physical Exam Constitutional:       Appearance: Normal appearance.  Cardiovascular:     Rate and Rhythm: Normal rate and regular rhythm.     Heart sounds: Normal heart sounds.  Pulmonary:     Effort: Pulmonary effort is normal.     Breath sounds: Normal breath sounds.  Skin:    General: Skin is warm.  Neurological:     General: No focal deficit present.     Mental Status: She is alert and oriented to person, place, and time.  Psychiatric:        Mood and Affect: Mood normal.        Behavior: Behavior normal.    BP (!) 150/76   Pulse 77   Temp (!) 97 F (36.1 C) (Temporal)   Ht 5' 7 (1.702 m)   Wt 198 lb (89.8 kg)   LMP 06/03/2017   SpO2 98%   BMI 31.01 kg/m          Assessment & Plan:   [name] in today with chief complaint of Transitions Of Care   1. Transition of care (Primary) All hospital records reviewed  2. Unstable angina pectoris due to coronary arteriosclerosis [facility]) Message sent to cardiology about seeing patient sooner then late next week Patient told to reduce stress Keep check of blood pressure If chest pain develops back to the ED    The above assessment and management plan was discussed with the patient. The patient verbalized understanding of and has agreed to the management plan. Patient is aware to call the clinic if symptoms persist or worsen. Patient is aware when to return to the clinic for a follow-up visit. Patient educated on when it is appropriate to go to the emergency department.   [name], [name]   "

## 2024-10-29 NOTE — Addendum Note (Signed)
 Addended by: Adia Crammer, [name] on: 10/29/2024 03:42 PM   Modules accepted: Orders

## 2024-10-31 ENCOUNTER — Telehealth: Payer: Self-pay | Admitting: Nurse Practitioner

## 2024-10-31 ENCOUNTER — Other Ambulatory Visit: Payer: Self-pay

## 2024-10-31 DIAGNOSIS — J0101 Acute recurrent maxillary sinusitis: Secondary | ICD-10-CM

## 2024-10-31 DIAGNOSIS — I10 Essential (primary) hypertension: Secondary | ICD-10-CM

## 2024-10-31 NOTE — Telephone Encounter (Unsigned)
 Copied from CRM #[id number]. Topic: Clinical - Medication Refill >> Oct 31, 2024  1:08 PM Miquel SAILOR wrote: Medication: acetaminophen  (TYLENOL ) 500 MG tablet albuterol  (VENTOLIN  HFA) 108 (90 Base) MCG/ACT inhaler amLODipine  (NORVASC ) 10 MG tablet aspirin  EC 81 MG tablet blood glucose meter kit and supplies Calcium  Carb-Cholecalciferol (CALCIUM  600 + D PO) cetirizine  (ZYRTEC ) 10 MG tablet cholecalciferol (VITAMIN D3) 25 MCG (1000 UNIT) tablet clobetasol  cream (TEMOVATE ) 0.05 % Continuous Glucose Sensor (DEXCOM G7 SENSOR) MISC cyclobenzaprine  (FLEXERIL ) 10 MG tablet diphenhydrAMINE  (BENADRYL ) 50 MG tablet doxycycline  (VIBRA -TABS) 100 MG tablet esomeprazole  (NEXIUM ) 40 MG capsule famotidine  (PEPCID ) 40 MG tablet fluticasone  (FLONASE ) 50 MCG/ACT nasal spray ibuprofen  (ADVIL ) 200 MG tablet insulin  glargine, 2 Unit Dial , (TOUJEO  MAX) 300 UNIT/ML Solostar Pen Insulin  Pen Needle (PEN NEEDLES) 32G X 4 MM MISC ipratropium-albuterol  (DUONEB) 0.5-2.5 (3) MG/3ML SOLN LORazepam  (ATIVAN ) 0.5 MG tablet magnesium  oxide (MAG-OX) 400 MG tablet metoprolol  tartrate (LOPRESSOR ) 50 MG tablet nicotine  (NICODERM CQ  - DOSED IN MG/24 HOURS) 21 mg/24hr patch ondansetron  (ZOFRAN -ODT) 8 MG disintegrating tablet tirzepatide  (MOUNJARO ) 2.5 MG/0.5ML Pen Vitamin D , Ergocalciferol , (DRISDOL ) 1.25 MG (50000 UNIT) CAPS capsule    Has the patient contacted their pharmacy? Yes (Agent: If no, request that the patient contact the pharmacy for the refill. If patient does not wish to contact the pharmacy document the reason why and proceed with request.) (Agent: If yes, when and what did the pharmacy advise?)  This is the patient's preferred pharmacy:  [id number] - [place], [place] - [address] [place] [address] Phone: [phone] Fax: [phone]  Is this the correct pharmacy for this prescription? Yes If no, delete pharmacy and type the correct one.   Has the prescription been filled recently?  Yes  Is the patient out of the medication? Yes  Has the patient been seen for an appointment in the last year OR does the patient have an upcoming appointment? Yes  Can we respond through MyChart? Yes  Agent: Please be advised that Rx refills may take up to 3 business days. We ask that you follow-up with your pharmacy.

## 2024-11-01 MED ORDER — INSULIN GLARGINE (2 UNIT DIAL) 300 UNIT/ML ~~LOC~~ SOPN: 20.0000 [IU] | PEN_INJECTOR | Freq: Every day | SUBCUTANEOUS | 2 refills | Status: AC

## 2024-11-01 NOTE — Telephone Encounter (Signed)
 Received a fax from [facility]. Patient contacted after hours stating she needs her insulin  refilled.  She is out.

## 2024-11-04 NOTE — Progress Notes (Unsigned)
 "  11/05/2024 Name: Brenda Cruz MRN: [id number] DOB: 03/23/1966  Chief Complaint  Patient presents with   Diabetes   Brenda Cruz is a 59 y.o. year old female who presented for a telephone visit.   They were referred to the pharmacist by their PCP for assistance in managing diabetes and complex medication management.   Subjective:  Patient presents for her telephonic appointment for medication management. She was recently charged from the hospital on 10/25/24 for cardiac chest pain. She followed up with [name], NP on 10/29/24. She reports that her sugars have been elevated since being in he hospital. Endorses missing a dose or two of Mounjaro  due to being in the hospital. Otherwise, she has been tolerating Mounjaro . She would like to continue with what she is doing and increase the dose. She is considering switching her pharmacies due to cost. Toujeo  cost around $120 for a month supply.  Care Team: Primary Care Provider: [name], [name] ; Next Scheduled Visit: 11/21/24  Medication Access/Adherence  Current Pharmacy:  [id number] - [place], [place] - [address] [place] [address] Phone: [phone] Fax: [phone]  Patient reports affordability concerns with their medications: Yes  - patient will investigate drug formulary for preferred insulin  and preferred pharmacy for her insurance. Patient reports access/transportation concerns to their pharmacy: No  Patient reports adherence concerns with their medications:  No    Diabetes: Current medications: Toujeo  22 units daily, Mounjaro  2.5 mg weekly Medications tried in the past: Farxiga  10 mg, metformin  1000 mg, Rybelsus  3 & 7 mg, Ozempic   Statin:  Not currently on statin therapy due to myalgias LDL of 176 on 10/26/24 Patient is considering adding Repatha  ACEi/ARB UACR of 73 on 12/28/23 - due for UACR this year  A1c of 9.4% on 10/25/24, up from 9.2% on 08/23/24  Using Dexcom G7 CGM; testing  daily Date of Download: 11/05/24 (14 day report) % Time CGM is active: 86% Average Glucose: 188 mg/dL Glucose Management Indicator: 7.8  Glucose Variability: 20.6 (goal <36%) Time in Goal:  - Time in range 70-180: 47% - Time above range: 53% - Time below range: 0% Observed patterns:     Patient denies hypoglycemic s/sx including dizziness, shakiness, sweating.  Patient denies hyperglycemic symptoms including polyuria, polydipsia, polyphagia, nocturia, neuropathy, blurred vision.  Current physical activity: increase as able  Objective:  Lab Results  Component Value Date   HGBA1C 9.4 (H) 10/25/2024    Lab Results  Component Value Date   CREATININE 1.17 (H) 10/25/2024   BUN 12 10/25/2024   NA 137 10/25/2024   K 4.3 10/25/2024   CL 99 10/25/2024   CO2 28 10/25/2024    Lab Results  Component Value Date   CHOL 260 (H) 10/26/2024   HDL 22 (L) 10/26/2024   LDLCALC 176 (H) 10/26/2024   TRIG 310 (H) 10/26/2024   CHOLHDL 12.0 10/26/2024    Medications Reviewed Today     Reviewed by Mamie Jenkins HERO, RPH (Pharmacist) on 11/05/24 at 1507  Med List Status: <None>   Medication Order Taking? Sig Documenting Provider Last Dose Status Informant  acetaminophen  (TYLENOL ) 500 MG tablet [id number] Yes Take 1,000 mg by mouth every 6 (six) hours as needed. [provider]  Active Self  albuterol  (VENTOLIN  HFA) 108 (90 Base) MCG/ACT inhaler [id number] Yes Inhale 2 puffs into the lungs every 4 (four) hours as needed for wheezing or shortness of breath. Joshua Clayborne RAMAN, PA-C  Active Self  amLODipine  (NORVASC ) 10  MG tablet [id number] Yes Take 1 tablet (10 mg total) by mouth daily. [name], [name], [name]  Active Self  aspirin  EC 81 MG tablet [id number] Yes Take 1 tablet (81 mg total) by mouth daily. Swallow whole. Jillian Buttery, MD  Active   blood glucose meter kit and supplies [id number] Yes Dispense based on patient and insurance preference. Use up to four times daily as directed. (FOR  ICD-10 E10.9, E11.9). [name] [name], [name]  Active Self  Calcium  Carb-Cholecalciferol (CALCIUM  600 + D PO) [id number] Yes Take 1 tablet by mouth daily. [provider]  Active Self  cetirizine  (ZYRTEC ) 10 MG tablet [id number] Yes TAKE ONE (1) TABLET BY MOUTH EVERY DAY [name], [name], [name]  Active Self  cholecalciferol (VITAMIN D3) 25 MCG (1000 UNIT) tablet [id number] Yes Take 1,000 Units by mouth daily. [provider]  Active Self   Patient not taking:   Discontinued 11/05/24 1504 (Patient Preference) Continuous Glucose Sensor (DEXCOM G7 SENSOR) MISC [id number] Yes Change every 10 days [name], [name]  Active Self  cyclobenzaprine  (FLEXERIL ) 10 MG tablet [id number] Yes TAKE ONE TABLET BY MOUTH THREE TIMES DAILY AS NEEDED FOR MUSCLE SPASM [name], [name], [name]  Active Self  diphenhydrAMINE  (BENADRYL ) 50 MG tablet [id number] Yes Take 1 tablet (50 mg total) by mouth once for 1 dose. Take on date/time instructed by prescribing provider.  Take within 1 hour of contrast injection. Call [phone] for questions. Pappayliou, Dorothyann LABOR, DO  Active Self   Patient not taking:   Discontinued 11/05/24 1504 (Completed Course) esomeprazole  (NEXIUM ) 40 MG capsule [id number] Yes Take 1 capsule (40 mg total) by mouth daily. [name], [name], [name]  Active Self  famotidine  (PEPCID ) 40 MG tablet [id number] Yes Take 1 tablet (40 mg total) by mouth daily. [name], [name], [name]  Active Self  fluticasone  (FLONASE ) 50 MCG/ACT nasal spray [id number] Yes Place 2 sprays into both nostrils daily. Ijaola, Onyeje M, NP  Active Self  ibuprofen  (ADVIL ) 200 MG tablet [id number] Yes Take 400 mg by mouth every 6 (six) hours as needed for moderate pain. [provider]  Active Self  insulin  glargine, 2 Unit Dial , (TOUJEO  MAX) 300 UNIT/ML Solostar Pen [id number] Yes Inject 20 Units into the skin daily.  Patient taking differently: Inject 22 Units into the skin daily.   [name],  [name], [name]  Active   Insulin  Pen Needle (PEN NEEDLES) 32G X 4 MM MISC [id number] Yes Use to administer insulin  daily. DX: E11.65 [name], [name]  Active Self  ipratropium-albuterol  (DUONEB) 0.5-2.5 (3) MG/3ML SOLN [id number] Yes Take 3 mLs by nebulization every 6 (six) hours as needed (Foir shortness of breath). [name], [name], [name]  Active Self  LORazepam  (ATIVAN ) 0.5 MG tablet [id number] Yes Take 1 tablet (0.5 mg total) by mouth at bedtime. [name], [name], [name]  Active Self  magnesium  oxide (MAG-OX) 400 MG tablet [id number] Yes Take 2 tablets (800 mg total) by mouth 2 (two) times daily. [name], [name], [name]  Active Self  metoprolol  tartrate (LOPRESSOR ) 50 MG tablet [id number] Yes Take 1 tablet (50 mg total) by mouth 2 (two) times daily. [name], [name]  Active    Patient not taking:   Discontinued 11/05/24 1506 (Patient Preference)   ondansetron  (ZOFRAN -ODT) 8 MG disintegrating tablet [id number] Yes TAKE 1 TABLET BY MOUTH EVERY 6 HOURS AS NEEDED FOR NAUSEA & VOMITING [name], [name], [name]  Active Self  tirzepatide  (MOUNJARO ) 2.5 MG/0.5ML Pen [id number] Yes Inject 2.5 mg into the skin once a week. Dx Z88.34 [name], [name]  Active  Vitamin D , Ergocalciferol , (DRISDOL ) 1.25 MG (50000 UNIT) CAPS capsule [id number] Yes Take 1 capsule (50,000 Units total) by mouth every 7 (seven) days. Zollie Lowers, MD  Active Self            Assessment/Plan:  Diabetes: Currently uncontrolled. Goal of <7% Cardiorenal risk reduction is has opportunities for improvement. Blood pressure is not at goal of <130/80 mmHg LDL is not at goal of 70 mg/dL Patient recently admitted to the hospital and has missed a few doses of Mounjaro  Patient would like to continue Mounjaro  and finish out her current supply of 2.5 mg weekly Patient will reach out to [place] to send in Mounjaro  5 mg once weekly Reviewed long term cardiovascular and renal outcomes of uncontrolled  blood sugar After discussion with Mliss at last visit and after hospitalization, patient would like to start Repatha . She would like to get her health back together prior to starting. Likely would want to start at next PharmD appointment after follow up with [name] 11/21/24. Reviewed goal A1c, goal fasting, and goal 2 hour post prandial glucose Recommend to:  Continue Toujeo  22 units daily and Mounjaro  2.5 mg once weekly--refill sent in; LF 11/01/24 Recommend to check glucose daily with Dexcom G7 Future Considerations:  Consider decreasing insulin  upon GLP-1 dose titration. Patient plans to follow up about preferred insulin  and pharmacy for her medications to be sent to in the future via MyChart.  Follow Up Plan: PharmD in one month  Jenkins Graces, PharmD PGY1 Pharmacy Resident  Mliss Tarry Griffin, PharmD, BCACP, CPP Clinical Pharmacist, [facility]    "

## 2024-11-05 ENCOUNTER — Other Ambulatory Visit

## 2024-11-05 DIAGNOSIS — E1165 Type 2 diabetes mellitus with hyperglycemia: Secondary | ICD-10-CM

## 2024-11-06 MED ORDER — TIRZEPATIDE 2.5 MG/0.5ML ~~LOC~~ SOAJ: 2.5000 mg | SUBCUTANEOUS | 0 refills | Status: AC

## 2024-11-07 ENCOUNTER — Telehealth: Payer: Self-pay | Admitting: Pharmacy Technician

## 2024-11-07 ENCOUNTER — Other Ambulatory Visit (HOSPITAL_COMMUNITY): Payer: Self-pay

## 2024-11-07 NOTE — Telephone Encounter (Signed)
 Ok for note

## 2024-11-07 NOTE — Progress Notes (Signed)
" ° °  11/07/2024 Name: Brenda Cruz MRN: [id number] DOB: 08/28/1966  Patient is appearing for a follow-up visit with the population health pharmacy technician. Last engaged with the clinical pharmacist to discuss diabetes on 11/05/2024. Contacted patient today to discuss medication access.   Plan from last clinical pharmacist appointment:  Diabetes: Currently uncontrolled. Goal of <7% Cardiorenal risk reduction is has opportunities for improvement. Blood pressure is not at goal of <130/80 mmHg LDL is not at goal of 70 mg/dL Patient recently admitted to the hospital and has missed a few doses of Mounjaro  Patient would like to continue Mounjaro  and finish out her current supply of 2.5 mg weekly Patient will reach out to [place] to send in Mounjaro  5 mg once weekly Reviewed long term cardiovascular and renal outcomes of uncontrolled blood sugar After discussion with Mliss at last visit and after hospitalization, patient would like to start Repatha . She would like to get her health back together prior to starting. Likely would want to start at next PharmD appointment after follow up with [name] 11/21/24. Reviewed goal A1c, goal fasting, and goal 2 hour post prandial glucose Recommend to:  Continue Toujeo  22 units daily and Mounjaro  2.5 mg once weekly--refill sent in; LF 11/01/24 Recommend to check glucose daily with Dexcom G7 Future Considerations:  Consider decreasing insulin  upon GLP-1 dose titration. Patient plans to follow up about preferred insulin  and pharmacy for her medications to be sent to in the future via MyChart. Follow Up Plan: PharmD in one month(copy/paste from last note)   Medication Adherence Barriers Identified:   Access issues with any new medication or testing device: Yes Toujeo  Max   Medication Adherence Barriers Addressed/Actions Taken:  Reviewed medication changes per plan from last clinical pharmacist note Medication Access for Toujeo  Max Will discuss  medication access concerns with pharmacist Contacted pharmacy regarding Toujeo  [facility] Toujeo  on 11/03/2024 and the cost was $150 for 90 day supply. She informs the coupon card they had on file rejected but she is not sure why. She advised to have patient call insurance about pharmacy benefits. [employer] pharmacy services. Spoke to [employer]. Burnard informs Toujeo  is a Preferred Brand and the cost for a 3 month supply is $150. Called patient. HIPAA verified. Was able to obtain a copay savings card for the patient as below  [facility] and spoke to pharmacist. Pharmacist reversed claim from 1/18 and billed claim for today with coupon card and the cost is $35 for 90 day supply. Rph is hoping this will not cause refill too soon issue in the future.  Pharmacist informs to have patient bring in receipts (not necessary but helpful) and they would refund her $115. Called patient back. Informed her the coupon card  reduced the price to $35 and that [facility] would have a refund for her. Encouraged her to find the receipts and bring them with her. Patient verbalized understanding and appreciated the help. Informed patient when refills are due to make sure they bill her insurance as primary and the coupon card as secondary. Patient verbalized understanding  Next clinical pharmacist appointment is scheduled for: TBD  Kate Caddy, CPhT [facility] Office: [phone] Email: [email or web address]   "

## 2024-11-11 ENCOUNTER — Ambulatory Visit: Admitting: Cardiovascular Disease

## 2024-11-18 ENCOUNTER — Encounter (HOSPITAL_COMMUNITY): Payer: Self-pay

## 2024-11-20 ENCOUNTER — Encounter (HOSPITAL_COMMUNITY): Admission: RE | Admit: 2024-11-20

## 2024-11-20 ENCOUNTER — Encounter (HOSPITAL_COMMUNITY)

## 2024-11-20 DIAGNOSIS — I259 Chronic ischemic heart disease, unspecified: Secondary | ICD-10-CM | POA: Diagnosis not present

## 2024-11-20 LAB — NM PET CT CARDIAC PERFUSION MULTI W/ABSOLUTE BLOODFLOW
LV dias vol: 70 mL (ref 46–106)
MBFR: 2.1
Nuc Rest EF: 57 %
Nuc Stress EF: 63 %
Peak HR: 107 {beats}/min
Rest HR: 92 {beats}/min
Rest MBF: 0.91 ml/g/min
Rest Nuclear Isotope Dose: 23.3 mCi
ST Depression (mm): 0 mm
Stress MBF: 1.91 ml/g/min
Stress Nuclear Isotope Dose: 23.1 mCi
TID: 1.08

## 2024-11-20 MED ORDER — RUBIDIUM RB82 GENERATOR (RUBYFILL)
23.3000 | PACK | Freq: Once | INTRAVENOUS | Status: AC
  Administered 2024-11-20: 23.3 via INTRAVENOUS

## 2024-11-20 MED ORDER — RUBIDIUM RB82 GENERATOR (RUBYFILL)
23.1000 | PACK | Freq: Once | INTRAVENOUS | Status: AC
  Administered 2024-11-20: 23.1 via INTRAVENOUS

## 2024-11-20 MED ORDER — REGADENOSON 0.4 MG/5ML IV SOLN
INTRAVENOUS | Status: AC
  Filled 2024-11-20: qty 5

## 2024-11-20 MED ORDER — REGADENOSON 0.4 MG/5ML IV SOLN
0.4000 mg | Freq: Once | INTRAVENOUS | Status: AC
  Administered 2024-11-20: 0.4 mg via INTRAVENOUS

## 2024-11-21 ENCOUNTER — Ambulatory Visit: Admitting: Nurse Practitioner

## 2024-11-21 ENCOUNTER — Ambulatory Visit: Payer: Self-pay | Admitting: Physician Assistant

## 2024-11-26 ENCOUNTER — Ambulatory Visit: Admitting: Cardiovascular Disease

## 2025-01-07 ENCOUNTER — Ambulatory Visit: Admitting: Nurse Practitioner
